# Patient Record
Sex: Male | Born: 1940 | Race: White | Hispanic: No | Marital: Married | Smoking: Former smoker
Health system: Southern US, Community
[De-identification: ages and names within clinical notes are randomized; demographics above are authoritative.]

## PROBLEM LIST (undated history)

## (undated) DIAGNOSIS — K5790 Diverticulosis of intestine, part unspecified, without perforation or abscess without bleeding: Secondary | ICD-10-CM

## (undated) DIAGNOSIS — C189 Malignant neoplasm of colon, unspecified: Secondary | ICD-10-CM

## (undated) DIAGNOSIS — G4730 Sleep apnea, unspecified: Secondary | ICD-10-CM

## (undated) DIAGNOSIS — J8410 Pulmonary fibrosis, unspecified: Secondary | ICD-10-CM

## (undated) DIAGNOSIS — Z9989 Dependence on other enabling machines and devices: Secondary | ICD-10-CM

## (undated) DIAGNOSIS — Z803 Family history of malignant neoplasm of breast: Secondary | ICD-10-CM

## (undated) DIAGNOSIS — I10 Essential (primary) hypertension: Secondary | ICD-10-CM

## (undated) DIAGNOSIS — D689 Coagulation defect, unspecified: Secondary | ICD-10-CM

## (undated) DIAGNOSIS — H269 Unspecified cataract: Secondary | ICD-10-CM

## (undated) DIAGNOSIS — E785 Hyperlipidemia, unspecified: Secondary | ICD-10-CM

## (undated) DIAGNOSIS — Z86010 Personal history of colonic polyps: Secondary | ICD-10-CM

## (undated) DIAGNOSIS — Z800 Family history of malignant neoplasm of digestive organs: Secondary | ICD-10-CM

## (undated) DIAGNOSIS — I4510 Unspecified right bundle-branch block: Secondary | ICD-10-CM

## (undated) DIAGNOSIS — J449 Chronic obstructive pulmonary disease, unspecified: Secondary | ICD-10-CM

## (undated) DIAGNOSIS — T7840XA Allergy, unspecified, initial encounter: Secondary | ICD-10-CM

## (undated) DIAGNOSIS — G4733 Obstructive sleep apnea (adult) (pediatric): Secondary | ICD-10-CM

## (undated) DIAGNOSIS — Z801 Family history of malignant neoplasm of trachea, bronchus and lung: Secondary | ICD-10-CM

## (undated) DIAGNOSIS — Z86711 Personal history of pulmonary embolism: Secondary | ICD-10-CM

## (undated) DIAGNOSIS — K219 Gastro-esophageal reflux disease without esophagitis: Secondary | ICD-10-CM

## (undated) DIAGNOSIS — M1990 Unspecified osteoarthritis, unspecified site: Secondary | ICD-10-CM

## (undated) DIAGNOSIS — I2510 Atherosclerotic heart disease of native coronary artery without angina pectoris: Secondary | ICD-10-CM

## (undated) DIAGNOSIS — I480 Paroxysmal atrial fibrillation: Secondary | ICD-10-CM

## (undated) DIAGNOSIS — N400 Enlarged prostate without lower urinary tract symptoms: Secondary | ICD-10-CM

## (undated) HISTORY — DX: Malignant neoplasm of colon, unspecified: C18.9

## (undated) HISTORY — DX: Personal history of colonic polyps: Z86.010

## (undated) HISTORY — DX: Family history of malignant neoplasm of digestive organs: Z80.0

## (undated) HISTORY — DX: Family history of malignant neoplasm of trachea, bronchus and lung: Z80.1

## (undated) HISTORY — DX: Coagulation defect, unspecified: D68.9

## (undated) HISTORY — DX: Pulmonary fibrosis, unspecified: J84.10

## (undated) HISTORY — DX: Sleep apnea, unspecified: G47.30

## (undated) HISTORY — DX: Unspecified right bundle-branch block: I45.10

## (undated) HISTORY — DX: Family history of malignant neoplasm of breast: Z80.3

## (undated) HISTORY — DX: Benign prostatic hyperplasia without lower urinary tract symptoms: N40.0

## (undated) HISTORY — PX: POLYPECTOMY: SHX149

## (undated) HISTORY — DX: Gastro-esophageal reflux disease without esophagitis: K21.9

## (undated) HISTORY — DX: Personal history of pulmonary embolism: Z86.711

## (undated) HISTORY — PX: HAND LIGAMENT RECONSTRUCTION: SHX1726

## (undated) HISTORY — DX: Obstructive sleep apnea (adult) (pediatric): G47.33

## (undated) HISTORY — DX: Allergy, unspecified, initial encounter: T78.40XA

## (undated) HISTORY — DX: Atherosclerotic heart disease of native coronary artery without angina pectoris: I25.10

## (undated) HISTORY — DX: Paroxysmal atrial fibrillation: I48.0

## (undated) HISTORY — DX: Unspecified cataract: H26.9

## (undated) HISTORY — DX: Essential (primary) hypertension: I10

## (undated) HISTORY — DX: Diverticulosis of intestine, part unspecified, without perforation or abscess without bleeding: K57.90

## (undated) HISTORY — PX: UMBILICAL HERNIA REPAIR: SHX196

## (undated) HISTORY — DX: Unspecified osteoarthritis, unspecified site: M19.90

## (undated) HISTORY — DX: Hyperlipidemia, unspecified: E78.5

## (undated) HISTORY — DX: Chronic obstructive pulmonary disease, unspecified: J44.9

## (undated) HISTORY — PX: COLONOSCOPY: SHX174

## (undated) HISTORY — DX: Dependence on other enabling machines and devices: Z99.89

---

## 1990-12-17 DIAGNOSIS — C189 Malignant neoplasm of colon, unspecified: Secondary | ICD-10-CM

## 1990-12-17 HISTORY — DX: Malignant neoplasm of colon, unspecified: C18.9

## 1990-12-17 HISTORY — PX: COLECTOMY: SHX59

## 1991-12-18 HISTORY — PX: COLON SURGERY: SHX602

## 1998-10-20 ENCOUNTER — Encounter: Payer: Self-pay | Admitting: *Deleted

## 1998-10-25 ENCOUNTER — Observation Stay (HOSPITAL_COMMUNITY): Admission: RE | Admit: 1998-10-25 | Discharge: 1998-10-26 | Payer: Self-pay | Admitting: *Deleted

## 2000-01-22 ENCOUNTER — Encounter (INDEPENDENT_AMBULATORY_CARE_PROVIDER_SITE_OTHER): Payer: Self-pay

## 2000-01-22 ENCOUNTER — Other Ambulatory Visit: Admission: RE | Admit: 2000-01-22 | Discharge: 2000-01-22 | Payer: Self-pay | Admitting: Gastroenterology

## 2000-01-22 DIAGNOSIS — Z860100 Personal history of colon polyps, unspecified: Secondary | ICD-10-CM | POA: Insufficient documentation

## 2000-01-22 HISTORY — DX: Personal history of colon polyps, unspecified: Z86.0100

## 2000-01-22 HISTORY — DX: Personal history of colonic polyps: Z86.010

## 2000-04-09 ENCOUNTER — Emergency Department (HOSPITAL_COMMUNITY): Admission: EM | Admit: 2000-04-09 | Discharge: 2000-04-09 | Payer: Self-pay | Admitting: Internal Medicine

## 2001-05-06 ENCOUNTER — Ambulatory Visit (HOSPITAL_COMMUNITY): Admission: RE | Admit: 2001-05-06 | Discharge: 2001-05-06 | Payer: Self-pay | Admitting: *Deleted

## 2001-05-06 ENCOUNTER — Encounter: Payer: Self-pay | Admitting: *Deleted

## 2002-12-17 HISTORY — PX: LAPAROSCOPIC CHOLECYSTECTOMY: SUR755

## 2003-02-26 ENCOUNTER — Encounter: Payer: Self-pay | Admitting: Surgery

## 2003-02-26 ENCOUNTER — Ambulatory Visit (HOSPITAL_COMMUNITY): Admission: RE | Admit: 2003-02-26 | Discharge: 2003-02-26 | Payer: Self-pay | Admitting: Surgery

## 2003-03-12 ENCOUNTER — Ambulatory Visit (HOSPITAL_COMMUNITY): Admission: RE | Admit: 2003-03-12 | Discharge: 2003-03-12 | Payer: Self-pay | Admitting: Gastroenterology

## 2003-03-12 ENCOUNTER — Encounter: Payer: Self-pay | Admitting: Gastroenterology

## 2003-04-08 ENCOUNTER — Ambulatory Visit (HOSPITAL_COMMUNITY): Admission: RE | Admit: 2003-04-08 | Discharge: 2003-04-08 | Payer: Self-pay | Admitting: *Deleted

## 2003-04-08 ENCOUNTER — Encounter: Payer: Self-pay | Admitting: *Deleted

## 2003-04-27 ENCOUNTER — Ambulatory Visit (HOSPITAL_COMMUNITY): Admission: RE | Admit: 2003-04-27 | Discharge: 2003-04-27 | Payer: Self-pay | Admitting: Cardiology

## 2003-05-05 ENCOUNTER — Encounter (INDEPENDENT_AMBULATORY_CARE_PROVIDER_SITE_OTHER): Payer: Self-pay | Admitting: Specialist

## 2003-05-05 ENCOUNTER — Ambulatory Visit (HOSPITAL_BASED_OUTPATIENT_CLINIC_OR_DEPARTMENT_OTHER): Admission: RE | Admit: 2003-05-05 | Discharge: 2003-05-05 | Payer: Self-pay | Admitting: *Deleted

## 2003-08-08 ENCOUNTER — Observation Stay (HOSPITAL_COMMUNITY): Admission: EM | Admit: 2003-08-08 | Discharge: 2003-08-09 | Payer: Self-pay | Admitting: Emergency Medicine

## 2003-08-08 ENCOUNTER — Encounter: Payer: Self-pay | Admitting: General Surgery

## 2003-08-08 ENCOUNTER — Encounter: Payer: Self-pay | Admitting: Emergency Medicine

## 2003-08-08 ENCOUNTER — Encounter (INDEPENDENT_AMBULATORY_CARE_PROVIDER_SITE_OTHER): Payer: Self-pay

## 2004-12-26 ENCOUNTER — Ambulatory Visit: Payer: Self-pay | Admitting: Gastroenterology

## 2005-01-08 ENCOUNTER — Ambulatory Visit: Payer: Self-pay | Admitting: Gastroenterology

## 2005-12-17 DIAGNOSIS — I219 Acute myocardial infarction, unspecified: Secondary | ICD-10-CM

## 2005-12-17 DIAGNOSIS — Z86711 Personal history of pulmonary embolism: Secondary | ICD-10-CM

## 2005-12-17 HISTORY — DX: Personal history of pulmonary embolism: Z86.711

## 2005-12-17 HISTORY — DX: Acute myocardial infarction, unspecified: I21.9

## 2005-12-17 HISTORY — PX: CORONARY ARTERY BYPASS GRAFT: SHX141

## 2006-01-15 ENCOUNTER — Emergency Department (HOSPITAL_COMMUNITY): Admission: EM | Admit: 2006-01-15 | Discharge: 2006-01-16 | Payer: Self-pay | Admitting: Emergency Medicine

## 2006-01-15 ENCOUNTER — Emergency Department (HOSPITAL_COMMUNITY): Admission: EM | Admit: 2006-01-15 | Discharge: 2006-01-15 | Payer: Self-pay | Admitting: Emergency Medicine

## 2006-01-18 ENCOUNTER — Ambulatory Visit: Payer: Self-pay | Admitting: Cardiovascular Disease

## 2006-02-05 ENCOUNTER — Ambulatory Visit: Payer: Self-pay | Admitting: Cardiovascular Disease

## 2006-02-14 ENCOUNTER — Encounter: Payer: Self-pay | Admitting: Internal Medicine

## 2006-02-14 ENCOUNTER — Ambulatory Visit: Payer: Self-pay

## 2006-02-27 ENCOUNTER — Ambulatory Visit: Payer: Self-pay | Admitting: Cardiology

## 2006-02-28 ENCOUNTER — Inpatient Hospital Stay (HOSPITAL_BASED_OUTPATIENT_CLINIC_OR_DEPARTMENT_OTHER): Admission: RE | Admit: 2006-02-28 | Discharge: 2006-02-28 | Payer: Self-pay | Admitting: Internal Medicine

## 2006-02-28 ENCOUNTER — Ambulatory Visit: Payer: Self-pay | Admitting: Internal Medicine

## 2006-03-11 ENCOUNTER — Ambulatory Visit: Payer: Self-pay | Admitting: Internal Medicine

## 2006-03-12 ENCOUNTER — Ambulatory Visit: Payer: Self-pay | Admitting: Cardiovascular Disease

## 2006-03-15 ENCOUNTER — Inpatient Hospital Stay (HOSPITAL_COMMUNITY): Admission: RE | Admit: 2006-03-15 | Discharge: 2006-03-21 | Payer: Self-pay | Admitting: Surgery

## 2006-03-25 ENCOUNTER — Encounter: Payer: Self-pay | Admitting: Internal Medicine

## 2006-03-25 ENCOUNTER — Observation Stay (HOSPITAL_COMMUNITY): Admission: EM | Admit: 2006-03-25 | Discharge: 2006-03-25 | Payer: Self-pay | Admitting: Emergency Medicine

## 2006-03-25 ENCOUNTER — Ambulatory Visit: Payer: Self-pay | Admitting: Internal Medicine

## 2006-03-30 ENCOUNTER — Encounter: Payer: Self-pay | Admitting: Emergency Medicine

## 2006-03-31 ENCOUNTER — Inpatient Hospital Stay (HOSPITAL_COMMUNITY): Admission: EM | Admit: 2006-03-31 | Discharge: 2006-04-05 | Payer: Self-pay | Admitting: Internal Medicine

## 2006-04-01 ENCOUNTER — Encounter: Payer: Self-pay | Admitting: Vascular Surgery

## 2006-04-08 ENCOUNTER — Ambulatory Visit: Payer: Self-pay | Admitting: *Deleted

## 2006-04-09 ENCOUNTER — Encounter (HOSPITAL_COMMUNITY): Admission: RE | Admit: 2006-04-09 | Discharge: 2006-05-09 | Payer: Self-pay | Admitting: Cardiovascular Disease

## 2006-04-12 ENCOUNTER — Ambulatory Visit: Payer: Self-pay | Admitting: Cardiovascular Disease

## 2006-04-12 ENCOUNTER — Ambulatory Visit: Payer: Self-pay | Admitting: Cardiology

## 2006-04-22 ENCOUNTER — Ambulatory Visit: Payer: Self-pay | Admitting: Cardiology

## 2006-04-25 ENCOUNTER — Ambulatory Visit: Payer: Self-pay | Admitting: Cardiology

## 2006-04-25 ENCOUNTER — Ambulatory Visit: Payer: Self-pay | Admitting: Cardiovascular Disease

## 2006-05-15 ENCOUNTER — Ambulatory Visit: Payer: Self-pay | Admitting: *Deleted

## 2006-05-15 ENCOUNTER — Encounter (HOSPITAL_COMMUNITY): Admission: RE | Admit: 2006-05-15 | Discharge: 2006-06-14 | Payer: Self-pay | Admitting: Cardiovascular Disease

## 2006-06-17 ENCOUNTER — Encounter (HOSPITAL_COMMUNITY): Admission: RE | Admit: 2006-06-17 | Discharge: 2006-07-17 | Payer: Self-pay | Admitting: Cardiovascular Disease

## 2006-06-20 ENCOUNTER — Ambulatory Visit: Payer: Self-pay | Admitting: *Deleted

## 2006-07-01 ENCOUNTER — Encounter: Admission: RE | Admit: 2006-07-01 | Discharge: 2006-07-01 | Payer: Self-pay | Admitting: Family Medicine

## 2006-07-03 ENCOUNTER — Encounter: Admission: RE | Admit: 2006-07-03 | Discharge: 2006-07-03 | Payer: Self-pay | Admitting: Family Medicine

## 2006-07-08 ENCOUNTER — Ambulatory Visit: Payer: Self-pay | Admitting: Cardiovascular Disease

## 2006-07-19 ENCOUNTER — Ambulatory Visit: Payer: Self-pay | Admitting: Cardiology

## 2006-08-20 ENCOUNTER — Ambulatory Visit: Payer: Self-pay | Admitting: Cardiology

## 2006-09-17 ENCOUNTER — Ambulatory Visit: Payer: Self-pay | Admitting: Cardiology

## 2006-09-17 ENCOUNTER — Encounter: Admission: RE | Admit: 2006-09-17 | Discharge: 2006-09-17 | Payer: Self-pay | Admitting: Surgery

## 2006-09-27 ENCOUNTER — Ambulatory Visit: Payer: Self-pay | Admitting: Cardiovascular Disease

## 2006-11-19 ENCOUNTER — Ambulatory Visit: Payer: Self-pay | Admitting: Gastroenterology

## 2006-12-23 ENCOUNTER — Ambulatory Visit: Payer: Self-pay | Admitting: Gastroenterology

## 2006-12-30 ENCOUNTER — Ambulatory Visit: Payer: Self-pay | Admitting: Cardiovascular Disease

## 2007-04-28 ENCOUNTER — Ambulatory Visit: Payer: Self-pay | Admitting: Cardiovascular Disease

## 2007-04-28 ENCOUNTER — Ambulatory Visit: Payer: Self-pay | Admitting: Internal Medicine

## 2007-05-13 ENCOUNTER — Ambulatory Visit: Payer: Self-pay

## 2007-10-24 ENCOUNTER — Ambulatory Visit: Payer: Self-pay | Admitting: Cardiovascular Disease

## 2007-12-18 HISTORY — PX: INGUINAL HERNIA REPAIR: SUR1180

## 2007-12-18 HISTORY — PX: VENTRAL HERNIA REPAIR: SHX424

## 2008-02-11 DIAGNOSIS — C4492 Squamous cell carcinoma of skin, unspecified: Secondary | ICD-10-CM

## 2008-02-11 HISTORY — DX: Squamous cell carcinoma of skin, unspecified: C44.92

## 2008-03-18 ENCOUNTER — Ambulatory Visit: Payer: Self-pay | Admitting: Cardiovascular Disease

## 2008-05-03 ENCOUNTER — Ambulatory Visit: Payer: Self-pay | Admitting: Cardiology

## 2008-05-12 ENCOUNTER — Encounter: Payer: Self-pay | Admitting: Pulmonary Disease

## 2008-06-14 ENCOUNTER — Ambulatory Visit (HOSPITAL_COMMUNITY): Admission: RE | Admit: 2008-06-14 | Discharge: 2008-06-14 | Payer: Self-pay | Admitting: General Surgery

## 2008-06-16 ENCOUNTER — Observation Stay (HOSPITAL_COMMUNITY): Admission: EM | Admit: 2008-06-16 | Discharge: 2008-06-17 | Payer: Self-pay | Admitting: Emergency Medicine

## 2008-08-10 ENCOUNTER — Inpatient Hospital Stay (HOSPITAL_COMMUNITY): Admission: RE | Admit: 2008-08-10 | Discharge: 2008-08-14 | Payer: Self-pay | Admitting: General Surgery

## 2008-10-07 ENCOUNTER — Ambulatory Visit: Payer: Self-pay | Admitting: Cardiovascular Disease

## 2009-03-22 DIAGNOSIS — E7800 Pure hypercholesterolemia, unspecified: Secondary | ICD-10-CM

## 2009-03-22 DIAGNOSIS — I4510 Unspecified right bundle-branch block: Secondary | ICD-10-CM

## 2009-03-22 DIAGNOSIS — Z87898 Personal history of other specified conditions: Secondary | ICD-10-CM | POA: Insufficient documentation

## 2009-03-22 DIAGNOSIS — E785 Hyperlipidemia, unspecified: Secondary | ICD-10-CM

## 2009-03-22 DIAGNOSIS — I2510 Atherosclerotic heart disease of native coronary artery without angina pectoris: Secondary | ICD-10-CM | POA: Insufficient documentation

## 2009-03-22 DIAGNOSIS — K219 Gastro-esophageal reflux disease without esophagitis: Secondary | ICD-10-CM | POA: Insufficient documentation

## 2009-03-22 DIAGNOSIS — N400 Enlarged prostate without lower urinary tract symptoms: Secondary | ICD-10-CM | POA: Insufficient documentation

## 2009-03-22 DIAGNOSIS — E669 Obesity, unspecified: Secondary | ICD-10-CM

## 2009-03-22 DIAGNOSIS — I10 Essential (primary) hypertension: Secondary | ICD-10-CM

## 2009-03-23 ENCOUNTER — Encounter: Payer: Self-pay | Admitting: Cardiovascular Disease

## 2009-03-23 ENCOUNTER — Ambulatory Visit: Payer: Self-pay | Admitting: Cardiovascular Disease

## 2009-05-11 ENCOUNTER — Telehealth: Payer: Self-pay | Admitting: Cardiovascular Disease

## 2009-05-12 ENCOUNTER — Ambulatory Visit: Payer: Self-pay | Admitting: Cardiovascular Disease

## 2009-05-12 DIAGNOSIS — R0609 Other forms of dyspnea: Secondary | ICD-10-CM

## 2009-05-23 ENCOUNTER — Encounter: Payer: Self-pay | Admitting: Cardiovascular Disease

## 2009-05-23 ENCOUNTER — Ambulatory Visit: Payer: Self-pay

## 2009-05-23 ENCOUNTER — Ambulatory Visit: Payer: Self-pay | Admitting: Cardiovascular Disease

## 2009-08-25 ENCOUNTER — Encounter: Admission: RE | Admit: 2009-08-25 | Discharge: 2009-08-25 | Payer: Self-pay | Admitting: Otolaryngology

## 2009-09-21 ENCOUNTER — Encounter (INDEPENDENT_AMBULATORY_CARE_PROVIDER_SITE_OTHER): Payer: Self-pay | Admitting: *Deleted

## 2009-10-13 ENCOUNTER — Encounter: Payer: Self-pay | Admitting: Pulmonary Disease

## 2009-11-08 ENCOUNTER — Telehealth: Payer: Self-pay | Admitting: Cardiovascular Disease

## 2009-11-23 ENCOUNTER — Ambulatory Visit: Payer: Self-pay | Admitting: Cardiovascular Disease

## 2009-11-28 ENCOUNTER — Telehealth: Payer: Self-pay | Admitting: Cardiovascular Disease

## 2009-11-29 ENCOUNTER — Encounter: Payer: Self-pay | Admitting: Cardiovascular Disease

## 2009-11-30 LAB — CONVERTED CEMR LAB
BUN: 9 mg/dL (ref 6–23)
Basophils Absolute: 0 10*3/uL (ref 0.0–0.1)
Chloride: 105 meq/L (ref 96–112)
Eosinophils Absolute: 0.1 10*3/uL (ref 0.0–0.7)
Glucose, Bld: 95 mg/dL (ref 70–99)
HCT: 45.3 % (ref 39.0–52.0)
Hemoglobin: 14.9 g/dL (ref 13.0–17.0)
Lymphs Abs: 2.2 10*3/uL (ref 0.7–4.0)
MCHC: 32.8 g/dL (ref 30.0–36.0)
Neutro Abs: 2.7 10*3/uL (ref 1.4–7.7)
Potassium: 3.9 meq/L (ref 3.5–5.1)
RDW: 14.6 % (ref 11.5–14.6)

## 2009-12-19 ENCOUNTER — Encounter (HOSPITAL_COMMUNITY): Admission: RE | Admit: 2009-12-19 | Discharge: 2010-01-18 | Payer: Self-pay | Admitting: Cardiovascular Disease

## 2009-12-27 ENCOUNTER — Inpatient Hospital Stay (HOSPITAL_COMMUNITY): Admission: EM | Admit: 2009-12-27 | Discharge: 2009-12-30 | Payer: Self-pay | Admitting: Emergency Medicine

## 2009-12-27 ENCOUNTER — Ambulatory Visit: Payer: Self-pay | Admitting: Cardiology

## 2009-12-27 ENCOUNTER — Encounter (INDEPENDENT_AMBULATORY_CARE_PROVIDER_SITE_OTHER): Payer: Self-pay | Admitting: Internal Medicine

## 2010-01-14 ENCOUNTER — Emergency Department (HOSPITAL_COMMUNITY)
Admission: EM | Admit: 2010-01-14 | Discharge: 2010-01-14 | Payer: Self-pay | Source: Home / Self Care | Admitting: Emergency Medicine

## 2010-01-14 ENCOUNTER — Telehealth: Payer: Self-pay | Admitting: Nurse Practitioner

## 2010-01-16 DIAGNOSIS — G4733 Obstructive sleep apnea (adult) (pediatric): Secondary | ICD-10-CM

## 2010-01-23 ENCOUNTER — Ambulatory Visit: Payer: Self-pay | Admitting: Cardiovascular Disease

## 2010-01-23 DIAGNOSIS — I4891 Unspecified atrial fibrillation: Secondary | ICD-10-CM | POA: Insufficient documentation

## 2010-01-26 ENCOUNTER — Ambulatory Visit: Payer: Self-pay | Admitting: Pulmonary Disease

## 2010-02-07 ENCOUNTER — Ambulatory Visit: Payer: Self-pay | Admitting: Pulmonary Disease

## 2010-02-07 DIAGNOSIS — J310 Chronic rhinitis: Secondary | ICD-10-CM

## 2010-02-08 ENCOUNTER — Encounter: Payer: Self-pay | Admitting: Pulmonary Disease

## 2010-02-12 ENCOUNTER — Encounter: Payer: Self-pay | Admitting: Pulmonary Disease

## 2010-03-09 ENCOUNTER — Encounter: Payer: Self-pay | Admitting: Pulmonary Disease

## 2010-03-29 ENCOUNTER — Ambulatory Visit: Payer: Self-pay | Admitting: Pulmonary Disease

## 2010-04-03 ENCOUNTER — Telehealth: Payer: Self-pay | Admitting: Cardiovascular Disease

## 2010-04-12 ENCOUNTER — Ambulatory Visit: Payer: Self-pay | Admitting: Cardiovascular Disease

## 2010-04-19 ENCOUNTER — Encounter: Payer: Self-pay | Admitting: Pulmonary Disease

## 2010-04-21 ENCOUNTER — Telehealth: Payer: Self-pay | Admitting: Gastroenterology

## 2010-05-05 ENCOUNTER — Encounter: Payer: Self-pay | Admitting: Pulmonary Disease

## 2010-05-05 ENCOUNTER — Telehealth (INDEPENDENT_AMBULATORY_CARE_PROVIDER_SITE_OTHER): Payer: Self-pay | Admitting: *Deleted

## 2010-06-05 ENCOUNTER — Ambulatory Visit: Payer: Self-pay | Admitting: Cardiovascular Disease

## 2010-06-18 ENCOUNTER — Emergency Department (HOSPITAL_COMMUNITY): Admission: EM | Admit: 2010-06-18 | Discharge: 2010-06-18 | Payer: Self-pay | Admitting: Emergency Medicine

## 2010-11-23 ENCOUNTER — Encounter (INDEPENDENT_AMBULATORY_CARE_PROVIDER_SITE_OTHER): Payer: Self-pay | Admitting: *Deleted

## 2011-01-07 ENCOUNTER — Encounter: Payer: Self-pay | Admitting: Surgery

## 2011-01-10 ENCOUNTER — Encounter: Payer: Self-pay | Admitting: Cardiovascular Disease

## 2011-01-10 ENCOUNTER — Emergency Department (HOSPITAL_COMMUNITY)
Admission: EM | Admit: 2011-01-10 | Discharge: 2011-01-11 | Payer: Self-pay | Source: Home / Self Care | Admitting: Emergency Medicine

## 2011-01-11 LAB — CBC
Hemoglobin: 14.5 g/dL (ref 13.0–17.0)
MCV: 81.2 fL (ref 78.0–100.0)
Platelets: 171 10*3/uL (ref 150–400)
RBC: 5.26 MIL/uL (ref 4.22–5.81)
WBC: 6.2 10*3/uL (ref 4.0–10.5)

## 2011-01-11 LAB — DIFFERENTIAL
Eosinophils Absolute: 0.1 10*3/uL (ref 0.0–0.7)
Lymphocytes Relative: 36 % (ref 12–46)
Lymphs Abs: 2.3 10*3/uL (ref 0.7–4.0)
Neutro Abs: 3.2 10*3/uL (ref 1.7–7.7)
Neutrophils Relative %: 51 % (ref 43–77)

## 2011-01-11 LAB — BASIC METABOLIC PANEL
CO2: 25 mEq/L (ref 19–32)
Calcium: 9.4 mg/dL (ref 8.4–10.5)
Creatinine, Ser: 1.08 mg/dL (ref 0.4–1.5)
GFR calc Af Amer: >60 mL/min (ref >60)
Glucose, Bld: 149 mg/dL — ABNORMAL HIGH (ref 70–99)

## 2011-01-11 LAB — POCT CARDIAC MARKERS
CKMB, poc: <1 ng/mL — ABNORMAL LOW (ref 1.0–8.0)
Troponin i, poc: <0.05 ng/mL (ref 0.00–0.09)

## 2011-01-18 NOTE — Progress Notes (Signed)
Summary: Pt has no enegry thinks it's coming from medication  Phone Note Call from Patient Call back at Home Phone [phone]   Caller: Patient Summary of Call: Pt has no enegry Initial call taken by: Judie Grieve,  April 03, 2010 10:35 AM  Follow-up for Phone Call        spoke with pt, he wants to discuss his meds with dr Eden Emms. he states he feels fatiqued and tired all the time and wants to discuss poss coming off some of his meds. follow up appt made Deliah Goody, RN  April 03, 2010 10:46 AM

## 2011-01-18 NOTE — Assessment & Plan Note (Signed)
Summary: rov/ mbw   Copy to:  Dr. Charlton Haws Primary Provider/Referring Provider:  Dr. Neva Seat  CC:  Dyspnea follow-up.  Discuss PFT's..  History of Present Illness: 70 yo male with dyspnea, and sleep apnea.  He continues to have trouble with his breathing when he exerts himself.  He has not noticed any benefit from inhaler therapy.  His PFT's today were essentially normal  He has been using his CPAP.  He has not had his pressure changed since starting CPAP therapy in 2009.  He had his sleep study done with [facility].  He is using [employer] for his DME.  His main problem with CPAP is nasal congestion.  He has been using omnaris for the past two months, and this has helped.   Current Medications (verified): 1)  Zocor 20 Mg Tabs (Simvastatin) .Marland Kitchen.. 1 Tab By Mouth Once Daily 2)  Aspirin 325 Mg  Tabs (Aspirin) .Marland Kitchen.. 1 Tab By Mouth Once Daily 3)  Carvedilol 6.25 Mg Tabs (Carvedilol) .Marland Kitchen.. 1 Tab By Mouth Two Times A Day 4)  Flomax 0.4 Mg Xr24h-Cap (Tamsulosin Hcl) .Marland Kitchen.. 1 Tab By Mouth Once Daily 5)  Lisinopril 10 Mg Tabs (Lisinopril) .... Take One Tablet By Mouth Daily 6)  Prilosec 20 Mg Cpdr (Omeprazole) .Marland Kitchen.. 1 Tab By Mouth Once Daily 7)  Fish Oil 1000 Mg Caps (Omega-3 Fatty Acids) .Marland Kitchen.. 1 Cap Once Daily 8)  Garlic .Marland Kitchen.. 1 Tab By Mouth Once Daily 9)  Multaq 400 Mg Tabs (Dronedarone Hcl) .Marland Kitchen.. 1 Tab By Mouth Two Times A Day 10)  Xopenex Hfa 45 Mcg/act Aero (Levalbuterol Tartrate) .... Two Puffs Up To Four Times Per Day As Needed  Allergies (verified): No Known Drug Allergies  Past History:  Past Surgical History: Last updated: 01/26/2010 Sigmoid colectomy with colostomy 1992 Left inguinal hernia repair  Laprascopic cholecystectomy 2004 Three vessel coronary artery bypass graft 2007 Ventral hernia repair 2009  Past Medical History: CAD HTN Grade 1 diastolic dysfunction      - Echo Jan. 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   Nose:  no deformity, discharge, inflammation, or lesions Mouth:  MP 3, no oral lesions Neck:  no JVD.   Lungs:  diminished breath sounds, no wheezing or rales Heart:  regular rhythm, normal rate, and no murmurs.   Extremities:  no clubbing, cyanosis, edema, or deformity noted Cervical Nodes:  no significant adenopathy   Impression & Recommendations:  Problem # 1:  DYSPNEA (ICD-786.05) His pulmonary evaluation has been unremarkable for a specific cause of  his dyspnea.  My suspicion is that he likely has deconditioning related to inactivity and obesity.  I have advised him that, if there are no cardiac contra-indications, then he should start a gradual exercise program consisting of both cardiovascular training and light weight training.  Also advised that we could perform cardio-pulmonary exercise testing to further evaluate.  He would like to see what progress he makes with increasing exercise tolerance.  If he remains symptomatic, then he would consider CPET.  Problem # 2:  OBSTRUCTIVE SLEEP APNEA (ICD-327.23) He has severe sleep apnea.  He is on CPAP therapy.  I explained how sleep apnea can affect his health.  Driving precautions were discussed.  I explained how his weight is affecting his sleep.  Will get a copy of his CPAP download, and decide if any changes  are needed to his set up.  Problem # 3:  RHINITIS (ICD-472.0) He is to continue with omnaris.  Advised him to use nasal irrigation on a regular basis.  Medications Added to Medication List This Visit: 1)  Omnaris 50 Mcg/act Susp (Ciclesonide) .... Two sprays once daily  Complete Medication List: 1)  Zocor 20 Mg Tabs (Simvastatin) .Marland Kitchen.. 1 tab by mouth once daily 2)  Aspirin 325 Mg Tabs (Aspirin) .Marland Kitchen.. 1 tab by mouth once daily 3)  Carvedilol 6.25 Mg Tabs (Carvedilol) .Marland Kitchen.. 1 tab by mouth two times a day 4)  Flomax 0.4 Mg Xr24h-cap (Tamsulosin hcl) .Marland Kitchen.. 1 tab by mouth once daily 5)  Lisinopril 10 Mg Tabs (Lisinopril) .... Take one tablet by mouth daily 6)  Prilosec 20 Mg Cpdr (Omeprazole) .Marland Kitchen.. 1 tab by mouth once daily 7)  Fish Oil 1000 Mg Caps (Omega-3 fatty acids) .Marland Kitchen.. 1 cap once daily 8)  Garlic  .Marland Kitchen.. 1 tab by mouth once daily 9)  Multaq 400 Mg Tabs (Dronedarone hcl) .Marland Kitchen.. 1 tab by mouth two times a day 10)  Xopenex Hfa 45 Mcg/act Aero (Levalbuterol tartrate) .... Two puffs up to four times per day as needed 11)  Omnaris 50 Mcg/act Susp (Ciclesonide) .... Two sprays once daily  Other Orders: Est. Patient Level III (16109) DME Referral (DME)  Patient Instructions: 1)  Will get CPAP report from your machine 2)  Follow up in 3 months   Immunization History:  Influenza Immunization History:    Influenza:  historical (10/17/2009)  Pneumovax Immunization History:    Pneumovax:  historical (10/17/2005)

## 2011-01-18 NOTE — Assessment & Plan Note (Signed)
Summary: rov ///kp   Copy to:  Dr. Charlton Haws Primary Provider/Referring Provider:  Dr. Neva Seat  CC:  CPAP follow-up.  The patient says he is still trying to adjust to wearing the CPAP. He averages 5-6 hours every night. The patient does c/o fatigue. He is not using the Xopenex and says it did not help when he tried it.[name]  History of Present Illness: 70 yo male with dyspnea likely from deconditioning, and sleep apnea using CPAP 12 cm.  He has been doing better with CPAP.  He uses a nasal mask.  He gets a dry mouth occasionally, but otherwise has no trouble with his mask.  He is sleeping better, and feels like he has more energy during the day.  He is using his CPAP for about 6 hours per night.  His wife does not hear anymore snoring when he uses his CPAP.   Current Medications (verified): 1)  Zocor 20 Mg Tabs (Simvastatin) .[name].. 1 Tab By Mouth Once Daily 2)  Aspirin 325 Mg  Tabs (Aspirin) .[name].. 1 Tab By Mouth Once Daily 3)  Carvedilol 6.25 Mg Tabs (Carvedilol) .[name].. 1 Tab By Mouth Two Times A Day 4)  Flomax 0.4 Mg Xr24h-Cap (Tamsulosin Hcl) .[name].. 1 Tab By Mouth Once Daily 5)  Lisinopril 10 Mg Tabs (Lisinopril) .... Take One Tablet By Mouth Daily 6)  Prilosec 20 Mg Cpdr (Omeprazole) .[name].. 1 Tab By Mouth Once Daily 7)  Fish Oil 1000 Mg Caps (Omega-3 Fatty Acids) .[name].. 1 Cap Once Daily 8)  Garlic .[name].. 1 Tab By Mouth Once Daily 9)  Multaq 400 Mg Tabs (Dronedarone Hcl) .... 1/2 By Mouth Two Times A Day 10)  Xopenex Hfa 45 Mcg/act Aero (Levalbuterol Tartrate) .... Two Puffs Up To Four Times Per Day As Needed 11)  Omnaris 50 Mcg/act Susp (Ciclesonide) .... Two Sprays Once Daily  Allergies (verified): No Known Drug Allergies  Past History:  Past Medical History: CAD HTN Grade 1 diastolic dysfunction      - Echo Jan. 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 Sigmoid colectomy with colostomy 1992 Left inguinal hernia repair  Laprascopic cholecystectomy 2004 Three vessel coronary artery bypass graft 2007 Ventral hernia repair 2009  Vital Signs:  Patient profile:   70 year old male Height:      71 inches (180.34 cm) Weight:      241 pounds (109.55 kg) BMI:     33.73 O2 Sat:      94 % on Room air Temp:     97.8 degrees F (36.56 degrees C) oral Pulse rate:   83 / minute BP sitting:   132 / 80  (left arm) Cuff size:   regular  Vitals Entered By: Michel Bickers CMA (March 29, 2010 3:39 PM)  O2 Flow:  Room air CC: CPAP follow-up.  The patient says he is still trying to adjust to wearing the CPAP. He averages 5-6 hours every night. The patient does c/o fatigue. He is not using the Xopenex and says it did not help when he tried it.   Physical Exam  General:  obese.   Nose:  no deformity, discharge, inflammation, or lesions Mouth:  MP 3, no oral lesions Neck:  no JVD.   Lungs:  diminished breath sounds, no wheezing or rales Heart:  regular rhythm, normal rate, and no murmurs.   Extremities:  no clubbing, cyanosis, edema, or deformity noted Cervical Nodes:  no significant adenopathy   Impression & Recommendations:  Problem # 1:  OBSTRUCTIVE SLEEP APNEA (ICD-327.23) He has done well with CPAP, but feels that the pressure from the machine is now too high.  WIll arrange for auto-CPAP titration to see if we can decrease his pressure.  Problem # 2:  DYSPNEA (ICD-786.05) Likely form deconditioning.  Have encouraged him to continue his exercise program to gradual improve his stamina.  He is to also f/u with cardiology and primary care.  If his symptoms are persistent, and without explanation, he may need to have cardio-pulmonary exercise  testing.  Medications Added to Medication List This Visit: 1)  Multaq 400 Mg Tabs (Dronedarone hcl) .... 1/2 by mouth two times a day  Complete Medication List: 1)  Zocor 20 Mg Tabs (Simvastatin) .[name].. 1 tab by mouth once daily 2)  Aspirin 325 Mg Tabs (Aspirin) .[name].. 1 tab by mouth once daily 3)  Carvedilol 6.25 Mg Tabs (Carvedilol) .[name].. 1 tab by mouth two times a day 4)  Flomax 0.4 Mg Xr24h-cap (Tamsulosin hcl) .[name].. 1 tab by mouth once daily 5)  Lisinopril 10 Mg Tabs (Lisinopril) .... Take one tablet by mouth daily 6)  Prilosec 20 Mg Cpdr (Omeprazole) .[name].. 1 tab by mouth once daily 7)  Fish Oil 1000 Mg Caps (Omega-3 fatty acids) .[name].. 1 cap once daily 8)  Garlic  .[name].. 1 tab by mouth once daily 9)  Multaq 400 Mg Tabs (Dronedarone hcl) .... 1/2 by mouth two times a day 10)  Xopenex Hfa 45 Mcg/act Aero (Levalbuterol tartrate) .... Two puffs up to four times per day as needed 11)  Omnaris 50 Mcg/act Susp (Ciclesonide) .... Two sprays once daily  Other Orders: Est. Patient Level III (14782) DME Referral (DME)  Patient Instructions: 1)  Will arrange for a test of CPAP pressure setting at home 2)  Follow up in 4 months

## 2011-01-18 NOTE — Letter (Signed)
Summary: Appointment - Reminder 2  [facility]. [address] [place] [address]   Phone: [phone]  Fax: [phone]     November 23, 2010 [name]   [name] [address] [place], [place]  [address]   Dear [name],  Our records indicate that it is time to schedule a follow-up appointment.  Dr. Eden Emms         recommended that you follow up with [place] in    11/2010        . It is very important that we reach you to schedule this appointment. We look forward to participating in your health care needs. Please contact us at the number listed above at your earliest convenience to schedule your appointment.  If you are unable to make an appointment at this time, give [place] a call so we can update our records.     Sincerely,   Glass blower/designer

## 2011-01-18 NOTE — Miscellaneous (Signed)
Summary: CPAP 12 cm download 12/19/09 to 02/12/10  Clinical Lists Changes Used on 37 of 56 days with average 2hrs .  Average AHI 4.5 with pressure CPAP 12 cm H2O.  Will have my nurse call to inform pt that CPAP report looks good, but he needs to try to use CPAP for entire time he is asleep to get full benefit.  Appended Document: CPAP 12 cm download 12/19/09 to 02/12/10 LMOMTCB.  Appended Document: CPAP 12 cm download 12/19/09 to 02/12/10 Patient had verbal understanding that CPAP report looks good and that he needs to wear CPAP for the entire time he is sleeping to get full benefit. The patient is sch for f/u on 03/29/2010.

## 2011-01-18 NOTE — Miscellaneous (Signed)
Summary: CPAP download from 08/30/09 to 10/13/09  Clinical Lists Changes Pressure at 12 cm H2O.  Average AHI 2.5.  Used on 33 of 45 nights with average of 3hrs 17 min.  Will have my nurse contact pt to inform that CPAP report from Oct. 2010 looked okay, and will try to get more recent download to assess current status. Orders: Added new Referral order of DME Referral (DME) - Signed  Appended Document: CPAP download from 08/30/09 to 10/13/09 Patient aware download from CPAP in Oct 2010 looked good and Dr. Craige Cotta is going to order another one for more recent data. He will be contacted by his DME company regarding this matter.

## 2011-01-18 NOTE — Progress Notes (Signed)
Summary: Abd Pain & Constipation  Phone Note Call from Patient Call back at Home Phone [phone]   Caller: Fannie Knee Call For: Dr Jarold Motto Reason for Call: Talk to Nurse Summary of Call: Is in alot of pain - has not had a BM since Wednesday. Went to see Primary Care and was told to take a laxative but it was no help. Wonders if he can be seen fairly quickly.  Initial call taken by: Leanor Kail [facility],  Apr 21, 2010 9:08 AM  Follow-up for Phone Call        Pt said over the last 2 weeks he hasn't moved his bowels daily like he normall does. Dr Tiburcio Pea had him use a laxative and on Tues PM he had a substantial amount of stool.  He said he hasn't done much since then.  I asked about pain, blood in stool or on toilet tissue.  He has no pain and has seen no blood.  I recommended he use Miralax daily to help him get in a rhythm of having a BM.  I explained how to take it and asked him to call me Monday 5-9 and let me know what his bowel habits were over the weekend.  He thanked me and said he would get Miralax and call me Monday. Follow-up by: Joselyn Glassman,  Apr 21, 2010 9:37 AM  Additional Follow-up for Phone Call Additional follow up Details #1::        i agree Additional Follow-up by: Mardella Layman MD [facility],  Apr 21, 2010 12:37 PM

## 2011-01-18 NOTE — Miscellaneous (Signed)
Summary: Pulmonary function test   Pulmonary Function Test Date: 02/07/2010 Height (in.): 71 Gender: Male  Pre-Spirometry FVC    Value: 3.98 L/min   Pred: 4.54 L/min     % Pred: 88 % FEV1    Value: 3.05 L     Pred: 3.06 L     % Pred: 99 % FEV1/FVC  Value: 77 %     Pred: 68 %     % Pred: . % FEF 25-75  Value: 2.72 L/min   Pred: 2.75 L/min     % Pred: 99 %  Post-Spirometry FVC    Value: 3.98 L/min   Pred: 4.54 L/min     % Pred: 88 % FEV1    Value: 3.14 L     Pred: 3.06 L     % Pred: 103 % FEV1/FVC  Value: 79 %     Pred: 68 %     % Pred: . % FEF 25-75  Value: 3.00 L/min   Pred: 2.75 L/min     % Pred: 109 %  Lung Volumes TLC    Value: 7.94 L   % Pred: 117 % RV    Value: 3.80 L   % Pred: 146 % DLCO    Value: 21.6 %   % Pred: 84 % DLCO/VA  Value: 3.75 %   % Pred: 106 %  Comments: Normal spirometry.  Normal lung volumes.  Normal diffusion capacity.  No bronchodilator response. Clinical Lists Changes  Observations: Added new observation of PFT COMMENTS: Normal spirometry.  Normal lung volumes.  Normal diffusion capacity.  No bronchodilator response. (02/08/2010 8:33) Added new observation of DLCO/VA%EXP: 106 % (02/08/2010 8:33) Added new observation of DLCO/VA: 3.75 % (02/08/2010 8:33) Added new observation of DLCO % EXPEC: 84 % (02/08/2010 8:33) Added new observation of DLCO: 21.6 % (02/08/2010 8:33) Added new observation of RV % EXPECT: 146 % (02/08/2010 8:33) Added new observation of RV: 3.80 L (02/08/2010 8:33) Added new observation of TLC % EXPECT: 117 % (02/08/2010 8:33) Added new observation of TLC: 7.94 L (02/08/2010 8:33) Added new observation of FEF2575%EXPS: 109 % (02/08/2010 8:33) Added new observation of PSTFEF25/75P: 2.75  (02/08/2010 8:33) Added new observation of PSTFEF25/75%: 3.00 L/min (02/08/2010 8:33) Added new observation of PSTFEV1/FCV%: . % (02/08/2010 8:33) Added new observation of FEV1FVCPRDPS: 68 % (02/08/2010 8:33) Added new observation of PSTFEV1/FVC:  79 % (02/08/2010 8:33) Added new observation of POSTFEV1%PRD: 103 % (02/08/2010 8:33) Added new observation of FEV1PRDPST: 3.06 L (02/08/2010 8:33) Added new observation of POST FEV1: 3.14 L/min (02/08/2010 8:33) Added new observation of POST FVC%EXP: 88 % (02/08/2010 8:33) Added new observation of FVCPRDPST: 4.54 L/min (02/08/2010 8:33) Added new observation of POST FVC: 3.98 L (02/08/2010 8:33) Added new observation of FEF % EXPEC: 99 % (02/08/2010 8:33) Added new observation of FEF25-75%PRE: 2.75 L/min (02/08/2010 8:33) Added new observation of FEF 25-75%: 2.72 L/min (02/08/2010 8:33) Added new observation of FEV1/FVC%EXP: . % (02/08/2010 8:33) Added new observation of FEV1/FVC PRE: 68 % (02/08/2010 8:33) Added new observation of FEV1/FVC: 77 % (02/08/2010 8:33) Added new observation of FEV1 % EXP: 99 % (02/08/2010 8:33) Added new observation of FEV1 PREDICT: 3.06 L (02/08/2010 8:33) Added new observation of FEV1: 3.05 L (02/08/2010 8:33) Added new observation of FVC % EXPECT: 88 % (02/08/2010 8:33) Added new observation of FVC PREDICT: 4.54 L (02/08/2010 8:33) Added new observation of FVC: 3.98 L (02/08/2010 8:33) Added new observation of PFT HEIGHT: 71  (02/08/2010 8:33) Added new observation of  PFT DATE: 02/07/2010  (02/08/2010 8:33)

## 2011-01-18 NOTE — Assessment & Plan Note (Signed)
Summary: eph/per Samara Deist will need EKG/jss   History of Present Illness: Anes is seen today for F/U of CAD with CABG in 2007.  He has chronic dyspnea that seems related to sinus and nasal issues.  He and his wife seem fixated on this.  He has normal LV function and no evidence of pulmonary hypertension.  He is a previous smoker.  He has had CT's for the last 3 years for a RLL nodule that has been stable over 3 years.  He is overweight with sleep apnea and wears CPAP at night.  He denies fever, cough, sputum. He gets congested at night.  He gets occasional SSCP with his SOB but it is infrequent and related to his nasal congestion.  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 1 Tab By Mouth Once Daily 2)  Aspirin 325 Mg  Tabs (Aspirin) .Marland Kitchen.. 1 Tab By Mouth Once Daily 3)  Carvedilol 6.25 Mg Tabs (Carvedilol) .Marland Kitchen.. 1 Tab By Mouth Two Times A Day 4)  Flomax 0.4 Mg Xr24h-Cap (Tamsulosin Hcl) .Marland Kitchen.. 1 Tab By Mouth Once Daily 5)  Lisinopril 10 Mg Tabs (Lisinopril) .... Take One Tablet By Mouth Daily 6)  Prilosec 20 Mg Cpdr (Omeprazole) .Marland Kitchen.. 1 Tab By Mouth Once Daily 7)  Fish Oil 1000 Mg Caps (Omega-3 Fatty Acids) .Marland Kitchen.. 1 Cap Once Daily 8)  Garlic .... 2 Tab By  Mouth Once Daily 9)  Multaq 400 Mg Tabs (Dronedarone Hcl) .Marland Kitchen.. 1 Tab By Mouth Two Times A Day  Allergies (verified): No Known Drug Allergies  Past History:  Past Medical History: Last updated: 03/23/2009 CABG 2007 Prostatism Hyperlipidemia Hypertension Lung nodule  Past Surgical History: Last updated: 03/22/2009 Coronary artery bypass graft   ventral hernia surgery bypass surgery 3 vessel  cholecystectomy  colon  resection  Family History: Last updated: 03/23/2009 non-contributory  Social History: Last updated: 03/23/2009 Married Retired Lives in [place] Non-smoker Non-drinker  Review of Systems       Denies fever, malais, weight loss, blurry vision, decreased visual acuity, cough, sputum,  hemoptysis, pleuritic pain, palpitaitons, heartburn, abdominal pain, melena, lower extremity edema, claudication, or rash. All other systems reviewed and negative  Vital Signs:  Patient profile:   70 year old male Height:      71 inches Weight:      237 pounds BMI:     33.17 Pulse rate:   68 / minute Resp:     14 per minute BP sitting:   115 / 76  (right arm)  Vitals Entered By: Kem Parkinson (January 23, 2010 3:28 PM)  Physical Exam  General:  Affect appropriate Healthy:  appears stated age HEENT: normal Neck supple with no  adenopathy JVP normal no bruits no thyromegaly Lungs clear with no wheezing and good diaphragmatic motion Heart:  S1/S2 no murmur,rub, gallop or click PMI normal Abdomen: benighn, BS positve, no tenderness, no AAA no bruit.  No HSM or HJR Distal pulses intact with no bruits No edema Neuro non-focal Skin warm and dry Small ventral hernia   Impression & Recommendations:  Problem # 1:  SLEEP APNEA (ICD-780.57) F/U pulmonary consider pulmonary rehab Orders: Pulmonary Referral (Pulmonary)  Problem # 2:  PULMONARY NODULE (ICD-518.89) Stable over 3 years no need for CT  Quit smoking  Problem # 3:  BUNDLE BRANCH BLOCK, RIGHT  (ICD-426.4) Stabel with no evidence of high grade heart block His updated medication list for this problem includes:    Aspirin 325 Mg Tabs (Aspirin) .Marland Kitchen... 1 tab by mouth once daily    Carvedilol 6.25 Mg Tabs (Carvedilol) .Marland Kitchen... 1 tab by mouth two times a day    Lisinopril 10 Mg Tabs (Lisinopril) .Marland Kitchen... Take one tablet by mouth daily    Multaq 400 Mg Tabs (Dronedarone hcl) .Marland Kitchen... 1 tab by mouth two times a day  Problem # 4:  HYPERCHOLESTEROLEMIA (ICD-272.0) Watch liver check again in 2 months His updated medication list for this problem includes:    Zocor 20 Mg Tabs (Simvastatin) .Marland Kitchen... 1 tab by mouth once daily  Problem # 5:  ATRIAL FIBRILLATION (ICD-427.31) PAF  check LFTs in 8 weeks Consider stopping Multaq in 6 months His updated medication list for this problem includes:    Aspirin 325 Mg Tabs (Aspirin) .Marland Kitchen... 1 tab by mouth once daily    Carvedilol 6.25 Mg Tabs (Carvedilol) .Marland Kitchen... 1 tab by mouth two times a day    Multaq 400 Mg Tabs (Dronedarone hcl) .Marland Kitchen... 1 tab by mouth two times a day  Patient Instructions: 1)  Your physician recommends that you schedule a follow-up appointment in: 6 MONTHS 2)  You have been referred to PULMONARY FOR SLEEP APNEA AND POSS NEED FOR PULMONARY REHAB 3)  Your physician recommends that you return for lab work in:2 MONTHS- LIVER/401.1/V58.69   EKG Report  Procedure date:  01/23/2010  Findings:      NSR 69 QT 444 PR 212 RBBB

## 2011-01-18 NOTE — Miscellaneous (Signed)
Summary: [name] Progress Note  [name] Progress Note   Imported By: Roderic Ovens 05/04/2010 11:07:27  _____________________________________________________________________  External Attachment:    Type:   Image     Comment:   External Document

## 2011-01-18 NOTE — Miscellaneous (Signed)
Summary: Orders Update pft charges  Clinical Lists Changes  Orders: Added new Service order of Carbon Monoxide diffusing w/capacity (94720) - Signed Added new Service order of Lung Volumes (94240) - Signed Added new Service order of Spirometry (Pre & Post) (94060) - Signed 

## 2011-01-18 NOTE — Progress Notes (Signed)
Summary: returned call [facility] x1   Phone Note Call from Patient Call back at Home Phone [phone]   Call For: sood Summary of Call: Returning Lori's cal. Initial call taken by: Darletta Moll,  May 05, 2010 4:30 PM  Follow-up for Phone Call        Left message for pt to call office.  Gweneth Dimitri RN  May 05, 2010 4:33 PM      Appended Document: returned call [facility] x1  pt advsied per append.

## 2011-01-18 NOTE — Assessment & Plan Note (Signed)
Summary: rov   Visit Type:  Follow-up Referring Provider:  Dr. Charlton Haws Primary Provider:  Dr. Neva Seat  CC:  NO CARDIOLOGY COMPLAINTS.  History of Present Illness: [name] is seen today for F/U of CAD with CABG in 2007.  He has chronic dyspnea that seems related to sinus and nasal issues.  He and his wife seem fixated on this.  He has normal LV function and no evidence of pulmonary hypertension.  He is a previous smoker.  He has had CT's for the last 3 years for a RLL nodule that has been stable over 3 years.  He is overweight with sleep apnea and wears CPAP at night.  His pressure has been decreased recently to 10 mmHG  He denies fever, cough, sputum. He gets congested at night.  He appears to have lost some weight by scales but still has a way to go.   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 1 Tab By Mouth Once Daily 2)  Carvedilol 6.25 Mg Tabs (Carvedilol) .Marland Kitchen.. 1 Tab By Mouth Two Times A Day 3)  Flomax 0.4 Mg Xr24h-Cap (Tamsulosin Hcl) .Marland Kitchen.. 1 Tab By Mouth Once Daily 4)  Lisinopril 10 Mg Tabs (Lisinopril) .... Take One Tablet By Mouth Daily 5)  Prilosec 20 Mg Cpdr (Omeprazole) .Marland Kitchen.. 1 Tab By Mouth Once Daily 6)  Fish Oil 1000 Mg Caps (Omega-3 Fatty Acids) .Marland Kitchen.. 1 Cap Once Daily 7)  Garlic .Marland Kitchen.. 1 Tab By Mouth Once Daily 8)  Omnaris 50 Mcg/act Susp (Ciclesonide) ....  As Needed 9)  Zyrtec Allergy 10 Mg Tabs (Cetirizine Hcl) .... Take As Needed  Allergies (verified): No Known Drug Allergies  Past History:  Past Medical History: Last updated: 03/29/2010 CAD HTN Grade 1 diastolic dysfunction      - Echo Jan. 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 OSA      - PSG 05/12/08 AHI 53 with Complex apnea      - CPAP 12 cm H2O Dyspnea      - PFT 02/07/10 FVC 3.98(88), FEV1 3.14(103%), FEV1% 79, TLC 7.94(117%), DLCO 84%, no BD   Past Surgical History: Last updated: 01/26/2010 Sigmoid colectomy with colostomy 1992 Left inguinal hernia repair  Laprascopic cholecystectomy 2004 Three vessel coronary artery bypass graft 2007 Ventral hernia repair 2009  Family History: Last updated: 01/26/2010 Family History Colon Cancer---sister Family History Lung Cancer---mother Family History Breast Cancer---2 sisters Family History Emphysema ---father Heart disease---father and mother  Social History: Last updated: 01/26/2010 Married Retired Lives in [place] Non-smoker; Quit in 1997, 40 pack year history 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   Vital Signs:  Patient profile:   70 year old male Weight:      236 pounds Pulse rate:   98 / minute BP sitting:   143 / 87  (right arm)  Vitals Entered By: Dreama Saa, CNA (June 05, 2010 11:29 AM)  Physical Exam  General:  Affect appropriate Healthy:  appears stated age HEENT: normal Neck supple with no adenopathy JVP normal no bruits no thyromegaly Lungs clear with no wheezing and good diaphragmatic motion Heart:  S1/S2 no murmur,rub, gallop or click PMI normal Abdomen: benighn, BS positve, no  tenderness, no AAA no bruit.  No HSM or HJR Distal pulses intact with no bruits No edema Neuro non-focal Skin warm and dry    Impression & Recommendations:  Problem # 1:  CAD (ICD-414.00) Stable no angina.  Continue ASA and stressed importance of BB His updated medication list for this problem includes:    Aspirin 325 Mg Tabs (Aspirin) .Marland Kitchen... 1 tab by mouth once daily    Carvedilol 6.25 Mg Tabs (Carvedilol) .Marland Kitchen... 1 tab by mouth two times a day    Lisinopril 10 Mg Tabs (Lisinopril) .Marland Kitchen... Take one tablet by mouth daily  Problem # 2:  BUNDLE BRANCH BLOCK, RIGHT (ICD-426.4) Stable no evidence of high grade block or syncope His updated medication list for this problem includes:    Aspirin 325 Mg Tabs (Aspirin) .Marland Kitchen... 1 tab by mouth once daily    Carvedilol 6.25 Mg Tabs (Carvedilol) .Marland Kitchen... 1 tab by mouth two times a day    Lisinopril 10 Mg Tabs (Lisinopril) .Marland Kitchen... Take one tablet by mouth daily  Problem # 3:  HYPERCHOLESTEROLEMIA (ICD-272.0) F/U labs per primary  Statin D/C due to fatigue and myalgias with marked improvement  Problem # 4:  OBSTRUCTIVE SLEEP APNEA (ICD-327.23) F/U Dr Craige Cotta.  CPAP recently adjusted  Problem # 5:  HYPERTENSION (ICD-401.9) Well controlled.  Stressed compliance owith BB His updated medication list for this problem includes:    Aspirin 325 Mg Tabs (Aspirin) .Marland Kitchen... 1 tab by mouth once daily    Carvedilol 6.25 Mg Tabs (Carvedilol) .Marland Kitchen... 1 tab by mouth two times a day    Lisinopril 10 Mg Tabs (Lisinopril) .Marland Kitchen... Take one tablet by mouth daily  Patient Instructions: 1)  Your physician recommends that you schedule a follow-up appointment in: 6 months 2)  Your physician recommends that you continue on your current medications as directed. Please refer to the Current Medication list given to you today.

## 2011-01-18 NOTE — Progress Notes (Signed)
Summary: Cardiology Phone Note - weakness, irreg. HR  Phone Note Call from Patient   Caller: Spouse Summary of Call: received call from [name] stating that [name] has been feeling weak and fatigued today and has renoted recurrent irreg. heart rhythm (rate of 59) and bp of 90/63.  pt was recently started on multaq for a. fib.  I recommneded that if pt. is feeling poorly than they should consider coming into the ER for evaluation and ecg to reassess rhythm, etc.  They are planning on coming into [facility]. Initial call taken by: Creig Hines, ANP-BC,  January 14, 2010 10:03 PM

## 2011-01-18 NOTE — Assessment & Plan Note (Signed)
Summary: sob/apc   Copy to:  Dr. Charlton Haws Primary Provider/Referring Provider:  Dr. Neva Seat  CC:  Pulmonary consult for shortness of breath. The patient c/o increased sob with exertion and at rest..  History of Present Illness: 70 yo evaluation for dyspnea.  He has been having dyspnea for the past 6 to 9 months.  Really he feels this started after he had bypass surgery in 2007, but has been getting worse recently.  He does not recall any specific event that triggered his decline.    He now feels dyspnea even at rest at times.  He has some trouble walking stairs, and changing his clothes.  He had a pulmonary embolism in 2007 after heart surgery, but recent CT chest showed resolution of this.  He was recently hospitalized for a NSTEMI.  He has occasional cough and wheeze.  He does not produce much sputum, and denies hemoptysis.  He denies allergies, and has not history of asthma.  He denies pneumonia or tuberculosis.  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  He has a Emergency planning/management officer, but no other animal exposure.  He denies travel history, or sick exposures.  He has a right upper lobe granuloma which has been stable on CT chest from 2007 to 2010.  He started smoking at the age of 5, and quit in 1997.  He smoked 1 pack per day.  He has never used inhalers for his breathing.  He has been told he has sleep apnea.  He has not been using CPAP all the time because the mask gives him sinus congestion.     CT of Chest  Procedure date:  05/23/2009  Findings:       Findings: Mediastinal and hilar lymph nodes are not enlarged by CT   size criteria.  Some of these contain calcification.  Heart size   normal.  No pericardial effusion.    Biapical pleural parenchymal scarring.  There are mild changes of   centrilobular emphysema in the upper lobes.  Calcified granulomas   are seen in the right upper lobe.  A subpleural lymph node is seen  along the minor fissure, as on 03/25/2006, and is considered   benign.  No pleural fluid.  Airway is unremarkable.    Incidental imaging of the upper abdomen shows no acute findings.   Old left rib fractures.  No worrisome lytic or sclerotic lesions.    IMPRESSION:   A subpleural lymph node along the minor fissure is unchanged from   03/25/2006 and is considered benign.  CXR  Procedure date:  01/14/2010  Findings:      Findings: Cardiomegaly is present.  Bibasilar opacities not present   on previous examination could represent vascular congestion,   subsegmental atelectatic change, or early infiltrates.  I see no   effusion or pneumothorax.  Previous CABG.  Tortuous calcified   aorta.  Small granuloma noted right midlung zone is stable.  Bones   unremarkable.    IMPRESSION:   Cardiomegaly with new bilateral lower lobe opacities could   represent early infiltrates, edema, or subsegmental atelectasis;   worsening aeration compared with priors.    Preventive Screening-Counseling & Management  Alcohol-Tobacco     Smoking Status: quit     Year Quit: 1997     Pack years: 40 years x1 ppd  Current Medications (verified): 1)  Zocor 20 Mg Tabs (Simvastatin) .Marland Kitchen.. 1 Tab By Mouth Once Daily 2)  Aspirin 325 Mg  Tabs (Aspirin) .Marland Kitchen.. 1 Tab By Mouth Once Daily 3)  Carvedilol 6.25 Mg Tabs (Carvedilol) .Marland Kitchen.. 1 Tab By Mouth Two Times A Day 4)  Flomax 0.4 Mg Xr24h-Cap (Tamsulosin Hcl) .Marland Kitchen.. 1 Tab By Mouth Once Daily 5)  Lisinopril 10 Mg Tabs (Lisinopril) .... Take One Tablet By Mouth Daily 6)  Prilosec 20 Mg Cpdr (Omeprazole) .Marland Kitchen.. 1 Tab By Mouth Once Daily 7)  Fish Oil 1000 Mg Caps (Omega-3 Fatty Acids) .Marland Kitchen.. 1 Cap Once Daily 8)  Garlic .Marland Kitchen.. 1 Tab By Mouth Once Daily 9)  Multaq 400 Mg Tabs (Dronedarone Hcl) .Marland Kitchen.. 1 Tab By Mouth Two Times A Day  Allergies (verified): No Known Drug Allergies  Past History:  Past Medical History: CAD HTN Grade 1 diastolic dysfunction      - Echo Jan. 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 Family History Colon Cancer---sister Family History Lung Cancer---mother Family History Breast Cancer---2 sisters Family History Emphysema ---father Heart disease---father and mother  Social History: Reviewed history from 03/23/2009 and no changes required. Married Retired Lives in [employer]; Quit in 1997, 40 pack year history Non-drinker [place], [place] Status:  quit Pack years:  40 years x1 ppd  Review of Systems       The patient complains of shortness of breath with activity, shortness of breath at rest, acid heartburn, indigestion, nasal congestion/difficulty breathing through nose, and joint stiffness or pain.  The patient denies productive cough, non-productive cough, coughing up blood, chest pain, irregular heartbeats, loss of appetite, weight change, abdominal pain, difficulty swallowing, sore throat, tooth/dental problems, headaches, sneezing, itching, ear ache, anxiety, depression, hand/feet swelling, rash, change in color of mucus, and fever.    Vital Signs:  Patient profile:   70 year old male Height:      71 inches (180.34 cm) Weight:      237.25 pounds (107.84 kg) BMI:     33.21 O2 Sat:      95 % on Room air Temp:     98.1 degrees F (36.72 degrees C) oral Pulse rate:   82 / minute Resp:     95 per minute BP sitting:   102 / 80  (left arm) Cuff size:   regular  Vitals Entered By: Michel Bickers CMA (January 26, 2010 3:22 PM)  O2 Sat at Rest %:  95 O2 Flow:  Room air CC: Pulmonary consult for shortness of breath. The patient c/o increased sob with exertion and at  rest.   Physical Exam  General:  obese.   Eyes:  PERRLA and EOMI.   Nose:  no deformity, discharge, inflammation, or lesions Mouth:  MP 3, no oral lesions Neck:  no JVD.   Chest Wall:  no deformities noted Lungs:  diminished breath sounds, no wheezing or rales Heart:  regular rhythm, normal rate, and no murmurs.   Abdomen:  obese, soft, nontender Pulses:  pulses normal Extremities:  no clubbing, cyanosis, edema, or deformity noted Neurologic:  CN II-XII grossly intact with normal reflexes, coordination, muscle strength and tone Cervical Nodes:  no significant adenopathy Psych:  alert and cooperative; normal mood and affect; normal attention span and concentration   Pulmonary Function  Test Date: 01/26/2010 3:50 PM Gender: Male  Pre-Spirometry FVC    Value: 3.49 L/min   % Pred: 75.80 % FEV1    Value: 2.89 L     Pred: 3.40 L     % Pred: 84.90 % FEV1/FVC  Value: 82.72 %     % Pred: 112.10 %  Impression & Recommendations:  Problem # 1:  DYSPNEA (ICD-786.05) This is likely multifactorial.    He has an extensive history of coronary disease and diastolic dysfunction.  He is being followed by cardiology for this.    He is obese, and likely has a component of deconditioning.  Certainly rehab would be an option for this, pending completion of the remainder of his pulmonary evaluation.  He has an extensive history of tobacco abuse.  He has emphysematous changes on recent CT chest.  However, spirometry today was more suggestive of a restrictive defect.  Of note is that his recent chest xray showed basilar atelectasis versus infiltrates.  To further assess this I will repeat a chest xray today, and arrange for full pulmonary function testing.  I will also give a sample of xopenex to see if he gains symptomatic benefit.  Problem # 2:  OBSTRUCTIVE SLEEP APNEA (ICD-327.23) He has a history of sleep apnea, and has a CPAP machine.  His main complaint today is more related to his dyspnea, and  preferred to address this issue first.  Once the issue of his dyspnea is better addressed, will then focus more on his sleep apnea.  Medications Added to Medication List This Visit: 1)  Garlic  .Marland Kitchen.. 1 tab by mouth once daily 2)  Xopenex Hfa 45 Mcg/act Aero (Levalbuterol tartrate) .... Two puffs up to four times per day as needed  Complete Medication List: 1)  Zocor 20 Mg Tabs (Simvastatin) .Marland Kitchen.. 1 tab by mouth once daily 2)  Aspirin 325 Mg Tabs (Aspirin) .Marland Kitchen.. 1 tab by mouth once daily 3)  Carvedilol 6.25 Mg Tabs (Carvedilol) .Marland Kitchen.. 1 tab by mouth two times a day 4)  Flomax 0.4 Mg Xr24h-cap (Tamsulosin hcl) .Marland Kitchen.. 1 tab by mouth once daily 5)  Lisinopril 10 Mg Tabs (Lisinopril) .... Take one tablet by mouth daily 6)  Prilosec 20 Mg Cpdr (Omeprazole) .Marland Kitchen.. 1 tab by mouth once daily 7)  Fish Oil 1000 Mg Caps (Omega-3 fatty acids) .Marland Kitchen.. 1 cap once daily 8)  Garlic  .Marland Kitchen.. 1 tab by mouth once daily 9)  Multaq 400 Mg Tabs (Dronedarone hcl) .Marland Kitchen.. 1 tab by mouth two times a day 10)  Xopenex Hfa 45 Mcg/act Aero (Levalbuterol tartrate) .... Two puffs up to four times per day as needed  Other Orders: Consultation Level IV (16109) Full Pulmonary Function Test (PFT) T-2 View CXR (71020TC)  Patient Instructions: 1)  Chest xray today 2)  Will schedule breathing test (PFT) 3)  xopenex two puffs up to four times per day as needed for cough, wheeze, congestion, or shortness of breath 4)  Follow up in 2 to 3 weeks   CardioPerfect Spirometry  ID: [id number] Patient: [name], [name] DOB: 18-Aug-1941 Age: 70 Years Old Sex: Male Race: White Height: 71 Weight: 237.25 Status: Confirmed Past Medical History:  CABG 2007 Prostatism Hyperlipidemia Hypertension Lung nodule Recorded: 01/26/2010 3:50 PM  Parameter  Measured Predicted %Predicted FVC     3.49        4.61        75.80 FEV1     2.89        3.40  84.90 FEV1%   82.72        73.81        112.10 PEF    9.95        8.65        115     Interpretation: Pre: FVC= 3.49L FEV1= 2.89L FEV1%= 82.7% 2.89/3.49 FEV1/FVC (01/26/2010 3:54:53 PM), Mild restriction

## 2011-01-18 NOTE — Assessment & Plan Note (Signed)
Summary: rov/pt wants to discuss meds   Referring Provider:  Dr. Charlton Haws Primary Provider:  Dr. Neva Seat  CC:  sob.  History of Present Illness: [name] is seen today for F/U of CAD with CABG in 2007.  He has chronic dyspnea that seems related to sinus and nasal issues.  He and his wife seem fixated on this.  He has normal LV function and no evidence of pulmonary hypertension.  He is a previous smoker.  He has had CT's for the last 3 years for a RLL nodule that has been stable over 3 years.  He is overweight with sleep apnea and wears CPAP at night.  He denies fever, cough, sputum. He gets congested at night.  He gets occasional SSCP with his SOB but it is infrequent and related to his nasal congestion.  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   He thinks the Multaq and Zocor are giving him joint pain and malaise.  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 1 Tab By Mouth Once Daily 2)  Aspirin 325 Mg  Tabs (Aspirin) .Marland Kitchen.. 1 Tab By Mouth Once Daily 3)  Carvedilol 6.25 Mg Tabs (Carvedilol) .Marland Kitchen.. 1 Tab By Mouth Two Times A Day 4)  Flomax 0.4 Mg Xr24h-Cap (Tamsulosin Hcl) .Marland Kitchen.. 1 Tab By Mouth Once Daily 5)  Lisinopril 10 Mg Tabs (Lisinopril) .... Take One Tablet By Mouth Daily 6)  Prilosec 20  Mg Cpdr (Omeprazole) .Marland Kitchen.. 1 Tab By Mouth Once Daily 7)  Fish Oil 1000 Mg Caps (Omega-3 Fatty Acids) .Marland Kitchen.. 1 Cap Once Daily 8)  Garlic .Marland Kitchen.. 1 Tab By Mouth Once Daily 9)  Multaq 400 Mg Tabs (Dronedarone Hcl) .... 1/2 By Mouth Two Times A Day 10)  Omnaris 50 Mcg/act Susp (Ciclesonide) .... Two Sprays Once Daily  Allergies (verified): No Known Drug Allergies  Past History:  Past Medical History: Last updated: 03/29/2010 CAD HTN Grade 1 diastolic dysfunction      - Echo Jan. 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 OSA      - PSG 05/12/08 AHI 53 with Complex apnea      - CPAP 12 cm H2O Dyspnea      - PFT 02/07/10 FVC 3.98(88), FEV1 3.14(103%), FEV1% 79, TLC 7.94(117%), DLCO 84%, no BD   Past Surgical History: Last updated: 01/26/2010 Sigmoid colectomy with colostomy 1992 Left inguinal hernia repair  Laprascopic cholecystectomy 2004 Three vessel coronary artery bypass graft 2007 Ventral hernia repair 2009  Family History: Last updated: 01/26/2010 Family History Colon Cancer---sister Family History Lung Cancer---mother Family History Breast Cancer---2 sisters Family History Emphysema ---father Heart disease---father and mother  Social History: Last updated: 01/26/2010  Married Retired Lives in [employer]; Quit in 1997, 40 pack year history Non-drinker [place], [place]  Review of Systems       Denies fever, , weight loss, blurry vision, decreased visual acuity, cough, sputum, SOB, hemoptysis, pleuritic pain, palpitaitons, heartburn, abdominal pain, melena, lower extremity edema, claudication, or rash.   Vital Signs:  Patient profile:   70 year old male Height:      71 inches Weight:      244 pounds BMI:     34.15 Pulse rate:   73 / minute Resp:     14 per minute BP sitting:   124 / 89  (left arm)  Vitals Entered By: Kem Parkinson (April 12, 2010 8:11 AM)  Physical Exam  General:   Affect appropriate Healthy:  appears stated age HEENT: normal Neck supple with no adenopathy JVP normal no bruits no thyromegaly Lungs clear with no wheezing and good diaphragmatic motion Heart:  S1/S2 no murmur,rub, gallop or click PMI normal Abdomen: benighn, BS positve, no tenderness, no AAA no bruit.  No HSM or HJR Distal pulses intact with no bruits No edema Neuro non-focal Skin warm and dry    Impression & Recommendations:  Problem # 1:  ATRIAL FIBRILLATION (ICD-427.31) Maint NSR.  D/C multaq given liver risk The following medications were removed from the medication list:    Multaq 400 Mg Tabs (Dronedarone hcl) .Marland Kitchen... 1/2 by mouth two times a day His updated medication list for this problem includes:    Aspirin 325 Mg Tabs (Aspirin) .Marland Kitchen... 1 tab by mouth once daily    Carvedilol 6.25 Mg Tabs (Carvedilol) .Marland Kitchen... 1 tab by mouth two times a day  Problem # 2:  DYSPNEA (ICD-786.05) Continue CP"AP and F/U pulmonary His updated medication list for this problem includes:    Aspirin 325 Mg Tabs (Aspirin) .Marland Kitchen... 1 tab by mouth once daily    Carvedilol 6.25 Mg Tabs (Carvedilol) .Marland Kitchen... 1 tab by mouth two times a day    Lisinopril 10 Mg Tabs (Lisinopril) .Marland Kitchen... Take one tablet by mouth daily  His updated medication list for this problem includes:    Aspirin 325 Mg Tabs (Aspirin) .Marland Kitchen... 1 tab by mouth once daily    Carvedilol 6.25 Mg Tabs (Carvedilol) .Marland Kitchen... 1 tab by mouth two times a day    Lisinopril 10 Mg Tabs (Lisinopril) .Marland Kitchen... Take one tablet by mouth daily  Problem # 3:  HYPERCHOLESTEROLEMIA (ICD-272.0) Re check loabs off zocor and see how joint pain does The following medications were removed from the medication list:    Zocor 20 Mg Tabs (Simvastatin) .Marland Kitchen... 1 tab by mouth once daily  Problem # 4:  CAD (ICD-414.00) CABG in 2007  no angina.  Continue ASA and BB His updated medication list for this problem includes:    Aspirin 325 Mg Tabs (Aspirin) .Marland Kitchen... 1 tab by mouth once  daily    Carvedilol 6.25 Mg Tabs (Carvedilol) .Marland Kitchen... 1 tab by mouth two times a day    Lisinopril 10 Mg Tabs (Lisinopril) .Marland Kitchen... Take one tablet by mouth daily  Patient Instructions: 1)  Your physician recommends that you schedule a follow-up appointment in: 6-8 weeks. 2)  Your physician has recommended you make the following change in your medication: 1) STOP Multaq, 2) STOP Zocor.

## 2011-01-18 NOTE — Miscellaneous (Signed)
Summary: auto CPAP 04/03/10 to 04/19/10  Clinical Lists Changes Used on 17 of 18 nights with average 4hrs 15 min.  The 95th percentile pressure was 12 cm, but AHI was only 3.  Will have my nurse call to inform pt that CPAP report looked good, and will send order to his DME to decrease pressure from 12 cm to 10 cm.  He is to call if he has problems after pressure change.  Otherwise we will schedule a follow up appointment in 2 to 3 months. Orders: Added new Referral order of DME Referral (DME) - Signed  Appended Document: auto CPAP 04/03/10 to 04/19/10 LMOMTCB.  Appended Document: auto CPAP 04/03/10 to 04/19/10 pt advised per append and reminder placed for appt.

## 2011-01-22 ENCOUNTER — Ambulatory Visit (INDEPENDENT_AMBULATORY_CARE_PROVIDER_SITE_OTHER): Payer: Medicare Other | Admitting: Cardiovascular Disease

## 2011-01-22 ENCOUNTER — Encounter: Payer: Self-pay | Admitting: Cardiovascular Disease

## 2011-01-22 DIAGNOSIS — I498 Other specified cardiac arrhythmias: Secondary | ICD-10-CM | POA: Insufficient documentation

## 2011-01-22 DIAGNOSIS — R001 Bradycardia, unspecified: Secondary | ICD-10-CM | POA: Insufficient documentation

## 2011-01-22 DIAGNOSIS — I4949 Other premature depolarization: Secondary | ICD-10-CM

## 2011-01-22 DIAGNOSIS — I2510 Atherosclerotic heart disease of native coronary artery without angina pectoris: Secondary | ICD-10-CM

## 2011-02-01 NOTE — Assessment & Plan Note (Signed)
Summary: PAST DUE FIR 6 MTH F/U PER PT PHONE CALL/TG/BH/ notes on file...   Referring Provider:  Dr. Charlton Haws Primary Provider:  Dr. Neva Seat   History of Present Illness: [name] is seen today for F/U of CAD with CABG in 2007.  He has chronic dyspnea that seems related to sinus and nasal issues.  He and his wife seem fixated on this.  He has normal LV function and no evidence of pulmonary hypertension.  He is a previous smoker.  He has had CT's for the last 3 years for a RLL nodule that has been stable over 3 years.  He is overweight with sleep apnea and wears CPAP at night.  His pressure has been decreased recently to 10 mmHG  He denies fever, cough, sputum. He gets congested at night.  He appears to have lost some weight by scales but still has a way to go.   He has a chronic RBBB and PR of 212 msec,  with no evidence of high grade heart block  His pulse was a little high today and he had not taken his coreg  Current Medications (verified): 1)  Aspirin 325 Mg  Tabs (Aspirin) .Marland Kitchen.. 1 Tab By Mouth Once Daily 2)  Carvedilol 6.25 Mg Tabs (Carvedilol) .Marland Kitchen.. 1 Tab By Mouth Two Times A Day 3)  Flomax 0.4 Mg Xr24h-Cap (Tamsulosin Hcl) .Marland Kitchen.. 1 Tab By Mouth Once Daily 4)  Lisinopril 10 Mg Tabs (Lisinopril) .... Take One Tablet By Mouth Daily 5)  Fish Oil 1000 Mg Caps (Omega-3 Fatty Acids) .Marland Kitchen.. 1 Cap Once Daily 6)  Garlic .Marland Kitchen.. 1 Tab By Mouth Once Daily 7)  Omnaris 50 Mcg/act Susp (Ciclesonide) .... As Needed 8)  Zyrtec Allergy 10 Mg Tabs (Cetirizine Hcl) .... Take As Needed  Allergies: No Known Drug Allergies  Vital Signs:  Patient profile:   70 year old male Height:      71 inches Weight:      229 pounds BMI:     32.05 Pulse rate:   85 / minute Resp:     14 per minute BP sitting:   106 / 72  (left arm)  Vitals Entered By: Kem Parkinson (January 22, 2011 12:15 PM)    Impression & Recommendations:  Problem # 1:  DYSPNEA (ICD-786.05) Functional.  No cardiac etiology.   Encouraged him to be more compliant with CPAP His updated medication list for this problem includes:    Aspirin 325 Mg Tabs (Aspirin) .Marland Kitchen... 1 tab by mouth once daily    Carvedilol 6.25 Mg Tabs (Carvedilol) .Marland Kitchen... 1 tab by mouth two times a day    Lisinopril 10 Mg Tabs (Lisinopril) .Marland Kitchen... Take one tablet by mouth daily  Problem # 2:  BRADYCARDIA (ICD-427.89) Asymptomatic with chronic RBBB and no AV block  Follow His updated medication list for this problem includes:    Aspirin 325 Mg Tabs (Aspirin) .Marland Kitchen... 1 tab by mouth once daily    Carvedilol 6.25 Mg Tabs (Carvedilol) .Marland Kitchen... 1 tab by mouth two times a day    Lisinopril 10 Mg Tabs (Lisinopril) .Marland Kitchen... Take one tablet by mouth daily  Orders: Event (Event)  Problem # 3:  HYPERTENSION (ICD-401.9) Well controlled His updated medication list for this problem includes:    Aspirin 325 Mg Tabs (Aspirin) .Marland Kitchen... 1 tab by mouth once daily    Carvedilol 6.25 Mg Tabs (Carvedilol) .Marland Kitchen... 1 tab by mouth two times a day    Lisinopril 10 Mg Tabs (Lisinopril) .Marland Kitchen... Take  one tablet by mouth daily  Problem # 4:  CAD (ICD-414.00) Stable with no angina.  Continue asa and BB His updated medication list for this problem includes:    Aspirin 325 Mg Tabs (Aspirin) .Marland Kitchen... 1 tab by mouth once daily    Carvedilol 6.25 Mg Tabs (Carvedilol) .Marland Kitchen... 1 tab by mouth two times a day    Lisinopril 10 Mg Tabs (Lisinopril) .Marland Kitchen... Take one tablet by mouth daily  Patient Instructions: 1)  Your physician recommends that you schedule a follow-up appointment in: 3-4 weeks 2)  Your physician has recommended that you wear an event monitor.  Event monitors are medical devices that record the heart's electrical activity. Doctors most often use these monitors to diagnose arrhythmias. Arrhythmias are problems with the speed or rhythm of the heartbeat. The monitor is a small, portable device. You can wear one while you do your normal daily activities. This is usually used to diagnose what is  causing palpitations/syncope (passing out).

## 2011-02-06 ENCOUNTER — Emergency Department (HOSPITAL_COMMUNITY): Payer: Medicare Other

## 2011-02-06 ENCOUNTER — Emergency Department (HOSPITAL_COMMUNITY)
Admission: EM | Admit: 2011-02-06 | Discharge: 2011-02-06 | Disposition: A | Discharge status: Home / Self Care | Payer: Medicare Other | Attending: Emergency Medicine | Admitting: Emergency Medicine

## 2011-02-06 DIAGNOSIS — I10 Essential (primary) hypertension: Secondary | ICD-10-CM | POA: Insufficient documentation

## 2011-02-06 DIAGNOSIS — I25810 Atherosclerosis of coronary artery bypass graft(s) without angina pectoris: Secondary | ICD-10-CM | POA: Insufficient documentation

## 2011-02-06 DIAGNOSIS — R42 Dizziness and giddiness: Secondary | ICD-10-CM | POA: Insufficient documentation

## 2011-02-06 DIAGNOSIS — I4949 Other premature depolarization: Secondary | ICD-10-CM | POA: Insufficient documentation

## 2011-02-06 DIAGNOSIS — R55 Syncope and collapse: Secondary | ICD-10-CM | POA: Insufficient documentation

## 2011-02-06 DIAGNOSIS — Z85038 Personal history of other malignant neoplasm of large intestine: Secondary | ICD-10-CM | POA: Insufficient documentation

## 2011-02-06 LAB — CBC
MCV: 83.5 fL (ref 78.0–100.0)
Platelets: 166 10*3/uL (ref 150–400)
RBC: 5.75 MIL/uL (ref 4.22–5.81)
WBC: 13.8 10*3/uL — ABNORMAL HIGH (ref 4.0–10.5)

## 2011-02-06 LAB — POCT CARDIAC MARKERS: Troponin i, poc: <0.05 ng/mL (ref 0.00–0.09)

## 2011-02-06 LAB — DIFFERENTIAL
Basophils Absolute: 0 10*3/uL (ref 0.0–0.1)
Eosinophils Absolute: 0.1 10*3/uL (ref 0.0–0.7)
Lymphocytes Relative: 11 % — ABNORMAL LOW (ref 12–46)
Lymphs Abs: 1.5 10*3/uL (ref 0.7–4.0)
Neutrophils Relative %: 81 % — ABNORMAL HIGH (ref 43–77)

## 2011-02-06 LAB — BASIC METABOLIC PANEL
BUN: 17 mg/dL (ref 6–23)
Chloride: 105 mEq/L (ref 96–112)
Potassium: 4.2 mEq/L (ref 3.5–5.1)
Sodium: 136 mEq/L (ref 135–145)

## 2011-02-21 ENCOUNTER — Encounter: Payer: Self-pay | Admitting: Physician Assistant

## 2011-02-21 ENCOUNTER — Ambulatory Visit (INDEPENDENT_AMBULATORY_CARE_PROVIDER_SITE_OTHER): Payer: Medicare Other | Admitting: Physician Assistant

## 2011-02-21 DIAGNOSIS — R002 Palpitations: Secondary | ICD-10-CM

## 2011-02-21 DIAGNOSIS — R55 Syncope and collapse: Secondary | ICD-10-CM

## 2011-02-21 DIAGNOSIS — I4949 Other premature depolarization: Secondary | ICD-10-CM

## 2011-02-22 ENCOUNTER — Ambulatory Visit: Payer: Medicare Other | Admitting: Cardiovascular Disease

## 2011-02-27 NOTE — Assessment & Plan Note (Signed)
Summary: palps, sob, weakness   Visit Type:  Follow-up Referring Provider:  Dr. Charlton Haws Primary Provider:  Dr. Neva Seat  CC:  pt stated feels like his heart is skipping a  beat and also has SOB.  History of Present Illness: Primary Cardiologist:  Dr. Charlton Haws  [name] is a 70 yo male with a h/o CAD, status post CABG in 2007, A. fib, status post type II NSTEMI in January 2011 in the setting of A. fib with RVR, previous Multaq therapy, RBBB, chronic dyspnea, RLL nodule stable by chest CT for the last 3 years, sleep apnea, HTN, hyperlipidemia, GERD and remote history of pulmonary embolism after CABG in 2007.  Last heart catheterization in January 2011 demonstrated a patent S-RCA with prox 30-40% and 50% stenoses; S-OM and ramus intermediate with patent OM limb but an occluded ramus intermediate limb, S-D2 and LIMA-LAD patent with an EF of 55%.  Echo 1/ 11 demonstrated an EF of 55-60%; mild LVH; focal basal septal hypertrophy; grade 1 diastolic dysfunction and mild BAE and RVE.    He saw Dr. Eden Emms in early February 2012.  He was to be placed on an event monitor.  I do not have a record of that at this time.  He did have a visit to the emergency room 2/21 with syncope.  His blood pressure was noted to be low and he was treated with IV fluids.  He was diagnosed with vasovagal syncope.  He presents today with complaints of a skipping sensation.  This was part of the reason why he was placed on a monitor.  These episodes may last for 2-3 minutes.  He feels very weak with him.  He denies feeling near syncopal.  He has not had any syncope associated with his palpitations.  The day that he presented to the emergency room, he had what sounds like a gastroenteritis.  He had diarrhea that entire day.  His symptoms did improve with IV fluids.  He notes increased shortness of breath with his palpitations.  He denies increased dyspnea with exertion.  He is probable NYHA class II.  He denies  insomnia, PND or edema.  Current Medications (verified): 1)  Aspirin 325 Mg  Tabs (Aspirin) .Marland Kitchen.. 1 Tab By Mouth Once Daily 2)  Carvedilol 6.25 Mg Tabs (Carvedilol) .Marland Kitchen.. 1 Tab By Mouth Two Times A Day 3)  Flomax 0.4 Mg Xr24h-Cap (Tamsulosin Hcl) .Marland Kitchen.. 1 Tab By Mouth Once Daily 4)  Lisinopril 10 Mg Tabs (Lisinopril) .... As Needed 5)  Fish Oil 1000 Mg Caps (Omega-3 Fatty Acids) .Marland Kitchen.. 1 Cap Once Daily 6)  Garlic .Marland Kitchen.. 1 Tab By Mouth Once Daily 7)  Omnaris 50 Mcg/act Susp (Ciclesonide) .... As Needed 8)  Zyrtec Allergy 10 Mg Tabs (Cetirizine Hcl) .... Take As Needed 9)  Simvastatin 20 Mg Tabs (Simvastatin) .... Take One Tablet By Mouth Daily  Allergies (verified): No Known Drug Allergies  Past History:  Past Medical History: Reviewed history from 03/29/2010 and no changes required. CAD HTN Grade 1 diastolic dysfunction      - Echo Jan. 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  All other systems reviewed and negative.   Vital Signs:  Patient profile:   70 year old male Height:      71 inches Weight:      231.50 pounds BMI:     32.40 Pulse rate:   47 / minute Pulse (ortho):   90 / minute BP sitting:   128 / 67  (left arm) BP standing:   129 / 86 Cuff size:   regular  Vitals Entered By: Caralee Ates CMA (February 21, 2011 11:59 AM)  Serial Vital Signs/Assessments:  Time      Position  BP       Pulse  Resp  Temp     By           Lying RA  130/83   82                    Scott Weaver PA-C           Sitting    125/83   85                    Scott Weaver PA-C           Standing  129/86   90                    Scott Weaver PA-C  Comments: after 2 minutes, BP 131/86 and pulse 92 After 5 minutes, BP 133/86 and pulse 89 By: Tereso Newcomer PA-C    Physical Exam  General:  Well nourished, well developed, in no acute distress HEENT: normal Neck: no JVD Cardiac:  normal S1, S2; RRR; no murmur; +S3 Lungs:  clear to auscultation bilaterally, no wheezing, rhonchi or rales Abd: soft, nontender, no hepatomegaly Ext: no  edema Endo: no thyromegaly Skin: warm and dry Neuro:  CNs 2-12 intact, no focal abnormalities noted    EKG  Procedure date:  02/21/2011  Findings:      normal sinus rhythm Right bundle branch block Heart rate 84 PR interval 202 ms No significant changes previous tracing  Impression & Recommendations:  Problem # 1:  PALPITATIONS (ICD-785.1) I reviewed his strips that are available from his event monitor.  He will complete this at the end of this week.  He has sinus rhythm and sinus bradycardia with heart rates ranging from the 50s to 80s.  He does have frequent PVCs.  He also has bigeminy at times.  He has at least 1-2 strips of documented ventricular couplets.  There is one strip that documents a ventricular triplet.  At the time of his syncopal episode, we lost transmission.  However, it appears that he remained in normal rhythm throughout the episode.  I reviewed his strips with Dr. Graciela Husbands today.  I will set him up with an echocardiogram.  He will complete his event monitor.  He will be brought back in close followup in the next couple of weeks.  Of note, he prefers to not followup with Dr. Eden Emms.  I will establish him with Dr. Shirlee Latch.  If his LV function is down, he will likely require cardiac catheterization.  He does not take his carvedilol on a regular basis.  He cannot tolerate it.  I have asked him to  decrease this to 3.125 mg twice a day.  Hopefully he will be able to  tolerate this.  I question whether or not he has symptomatic PVCs that contribute to a lot of his symptoms.  Orders: Echocardiogram (Echo)  Problem # 2:  HYPERTENSION (ICD-401.9) He has not really taken his lisinopril since he went to the emergency room.  He took it once due to high blood pressure.  I've asked him to remain off of this for now.  If he notices his blood pressures are increasing, he can certainly restart it and notify us.  Problem # 3:  SYNCOPE (ICD-780.2) As noted, I reviewed his strips with Dr. Graciela Husbands.  It appears that he did remain in a normal rhythm throughout his syncopal event.  He likely had a vagal event with recent onset of diarrhea and hypotension documented in the emergency room.  His symptoms did improve with fluid resuscitation.  Problem # 4:  CAD (ICD-414.00) Is not having angina.  However, if he has LV dysfunction he will likely need cardiac catheterization.  Problem # 5:  ATRIAL FIBRILLATION (ICD-427.31) Maintaining normal sinus rhythm.  He is no longer on Multaq as he could not tolerate it.  Orders: EKG w/ Interpretation (93000) Echocardiogram (Echo)  Patient Instructions: 1)  Your physician recommends that you schedule a follow-up appointment in: 5 to 6 weeks with Dr. Shirlee Latch and [place] weaver on 3/28 29 or 30 2)  Your physician has recommended you make the following change in your medication: decrease Coreg to one half a tab 2 times per day and hold Lisinopril 3)  Your physician has requested that you have an echocardiogram.  Echocardiography is a painless test that uses sound waves to create images of your heart. It provides your doctor with information about the size and shape of your heart and how well your heart's chambers and valves are working.  This procedure takes approximately one hour. There are no restrictions for this procedure.

## 2011-02-28 ENCOUNTER — Encounter: Payer: Self-pay | Admitting: Cardiovascular Disease

## 2011-03-04 LAB — BASIC METABOLIC PANEL
BUN: 11 mg/dL (ref 6–23)
BUN: 12 mg/dL (ref 6–23)
CO2: 25 mEq/L (ref 19–32)
CO2: 27 mEq/L (ref 19–32)
CO2: 29 mEq/L (ref 19–32)
Chloride: 105 mEq/L (ref 96–112)
Chloride: 103 mEq/L (ref 96–112)
Chloride: 106 mEq/L (ref 96–112)
Creatinine, Ser: 1.11 mg/dL (ref 0.4–1.5)
Creatinine, Ser: 1.31 mg/dL (ref 0.4–1.5)
GFR calc Af Amer: >60 mL/min (ref >60)
GFR calc Af Amer: >60 mL/min (ref >60)
GFR calc non Af Amer: >60 mL/min (ref >60)
Glucose, Bld: 103 mg/dL — ABNORMAL HIGH (ref 70–99)
Glucose, Bld: 110 mg/dL — ABNORMAL HIGH (ref 70–99)
Potassium: 3.7 mEq/L (ref 3.5–5.1)
Potassium: 3.4 mEq/L — ABNORMAL LOW (ref 3.5–5.1)
Potassium: 4.2 mEq/L (ref 3.5–5.1)
Sodium: 137 mEq/L (ref 135–145)
Sodium: 138 mEq/L (ref 135–145)

## 2011-03-04 LAB — PROTIME-INR: INR: 1.14 (ref 0.00–1.49)

## 2011-03-04 LAB — URINALYSIS, ROUTINE W REFLEX MICROSCOPIC
Bilirubin Urine: NEGATIVE
Ketones, ur: NEGATIVE mg/dL
Nitrite: NEGATIVE
Urobilinogen, UA: 0.2 mg/dL (ref 0.0–1.0)

## 2011-03-04 LAB — DIFFERENTIAL
Basophils Absolute: 0 10*3/uL (ref 0.0–0.1)
Basophils Absolute: 0 10*3/uL (ref 0.0–0.1)
Basophils Relative: 0 % (ref 0–1)
Eosinophils Absolute: 0.1 10*3/uL (ref 0.0–0.7)
Eosinophils Absolute: 0.1 10*3/uL (ref 0.0–0.7)
Eosinophils Absolute: 0.1 10*3/uL (ref 0.0–0.7)
Eosinophils Relative: 1 % (ref 0–5)
Eosinophils Relative: 1 % (ref 0–5)
Eosinophils Relative: 2 % (ref 0–5)
Lymphocytes Relative: 31 % (ref 12–46)
Lymphocytes Relative: 36 % (ref 12–46)
Lymphs Abs: 2.3 10*3/uL (ref 0.7–4.0)
Monocytes Absolute: 0.8 10*3/uL (ref 0.1–1.0)
Monocytes Relative: 11 % (ref 3–12)
Neutrophils Relative %: 59 % (ref 43–77)
Neutrophils Relative %: 58 % (ref 43–77)

## 2011-03-04 LAB — COMPREHENSIVE METABOLIC PANEL
ALT: 28 U/L (ref 0–53)
AST: 21 U/L (ref 0–37)
CO2: 28 mEq/L (ref 19–32)
Chloride: 104 mEq/L (ref 96–112)
Creatinine, Ser: 1.14 mg/dL (ref 0.4–1.5)
GFR calc Af Amer: >60 mL/min (ref >60)
GFR calc non Af Amer: >60 mL/min (ref >60)
Glucose, Bld: 76 mg/dL (ref 70–99)
Total Bilirubin: 0.6 mg/dL (ref 0.3–1.2)

## 2011-03-04 LAB — CBC
HCT: 41.6 % (ref 39.0–52.0)
HCT: 46.6 % (ref 39.0–52.0)
Hemoglobin: 15.1 g/dL (ref 13.0–17.0)
Hemoglobin: 13.8 g/dL (ref 13.0–17.0)
Hemoglobin: 14.9 g/dL (ref 13.0–17.0)
MCH: 29 pg (ref 26.0–34.0)
MCHC: 33.3 g/dL (ref 30.0–36.0)
MCHC: 33.3 g/dL (ref 30.0–36.0)
MCV: 86.9 fL (ref 78.0–100.0)
MCV: 86.1 fL (ref 78.0–100.0)
MCV: 87 fL (ref 78.0–100.0)
Platelets: 196 10*3/uL (ref 150–400)
RBC: 5.22 MIL/uL (ref 4.22–5.81)
RBC: 5.2 MIL/uL (ref 4.22–5.81)
RDW: 15.2 % (ref 11.5–15.5)
RDW: 15.4 % (ref 11.5–15.5)
WBC: 7.8 10*3/uL (ref 4.0–10.5)
WBC: 4.8 10*3/uL (ref 4.0–10.5)

## 2011-03-04 LAB — POCT CARDIAC MARKERS
CKMB, poc: 1.9 ng/mL (ref 1.0–8.0)
CKMB, poc: <1 ng/mL — ABNORMAL LOW (ref 1.0–8.0)
Myoglobin, poc: 59.2 ng/mL (ref 12–200)
Troponin i, poc: <0.05 ng/mL (ref 0.00–0.09)
Troponin i, poc: <0.05 ng/mL (ref 0.00–0.09)

## 2011-03-04 LAB — POCT URINALYSIS DIP (DEVICE)
Hgb urine dipstick: NEGATIVE
Nitrite: NEGATIVE
Protein, ur: NEGATIVE mg/dL
Urobilinogen, UA: 0.2 mg/dL (ref 0.0–1.0)
pH: 5.5 (ref 5.0–8.0)

## 2011-03-04 LAB — BRAIN NATRIURETIC PEPTIDE
Pro B Natriuretic peptide (BNP): 350 pg/mL — ABNORMAL HIGH (ref 0.0–100.0)
Pro B Natriuretic peptide (BNP): 64 pg/mL (ref 0.0–100.0)

## 2011-03-04 LAB — MRSA PCR SCREENING: MRSA by PCR: NEGATIVE

## 2011-03-04 LAB — TROPONIN I: Troponin I: 0.14 ng/mL — ABNORMAL HIGH (ref 0.00–0.06)

## 2011-03-04 LAB — HEPARIN LEVEL (UNFRACTIONATED): Heparin Unfractionated: <0.1 IU/mL — ABNORMAL LOW (ref 0.30–0.70)

## 2011-03-04 LAB — TSH: TSH: 1.071 u[IU]/mL (ref 0.350–4.500)

## 2011-03-04 LAB — CK TOTAL AND CKMB (NOT AT ARMC)
CK, MB: 2.8 ng/mL (ref 0.3–4.0)
Total CK: 122 U/L (ref 7–232)

## 2011-03-09 ENCOUNTER — Telehealth: Payer: Self-pay | Admitting: *Deleted

## 2011-03-09 NOTE — Telephone Encounter (Signed)
Spoke with pt, event monitor shows sinus with PVC's no sig arrhythmia

## 2011-03-13 ENCOUNTER — Other Ambulatory Visit (HOSPITAL_COMMUNITY): Payer: Self-pay | Admitting: Cardiology

## 2011-03-13 DIAGNOSIS — R002 Palpitations: Secondary | ICD-10-CM

## 2011-03-14 ENCOUNTER — Ambulatory Visit: Payer: Medicare Other | Admitting: Cardiovascular Disease

## 2011-03-14 ENCOUNTER — Other Ambulatory Visit: Payer: Self-pay | Admitting: Internal Medicine

## 2011-03-15 ENCOUNTER — Ambulatory Visit (HOSPITAL_COMMUNITY): Payer: Medicare Other | Attending: Cardiology | Admitting: Radiology

## 2011-03-15 ENCOUNTER — Other Ambulatory Visit (HOSPITAL_COMMUNITY): Payer: Medicare Other | Admitting: Radiology

## 2011-03-15 ENCOUNTER — Ambulatory Visit (INDEPENDENT_AMBULATORY_CARE_PROVIDER_SITE_OTHER): Payer: Medicare Other | Admitting: Physician Assistant

## 2011-03-15 ENCOUNTER — Encounter: Payer: Self-pay | Admitting: Physician Assistant

## 2011-03-15 VITALS — BP 140/92 | HR 66 | Ht 71.0 in | Wt 231.0 lb

## 2011-03-15 DIAGNOSIS — I252 Old myocardial infarction: Secondary | ICD-10-CM | POA: Insufficient documentation

## 2011-03-15 DIAGNOSIS — I10 Essential (primary) hypertension: Secondary | ICD-10-CM

## 2011-03-15 DIAGNOSIS — E669 Obesity, unspecified: Secondary | ICD-10-CM | POA: Insufficient documentation

## 2011-03-15 DIAGNOSIS — R002 Palpitations: Secondary | ICD-10-CM | POA: Insufficient documentation

## 2011-03-15 DIAGNOSIS — R55 Syncope and collapse: Secondary | ICD-10-CM | POA: Insufficient documentation

## 2011-03-15 DIAGNOSIS — Z87891 Personal history of nicotine dependence: Secondary | ICD-10-CM | POA: Insufficient documentation

## 2011-03-15 DIAGNOSIS — I2510 Atherosclerotic heart disease of native coronary artery without angina pectoris: Secondary | ICD-10-CM | POA: Insufficient documentation

## 2011-03-15 DIAGNOSIS — E785 Hyperlipidemia, unspecified: Secondary | ICD-10-CM | POA: Insufficient documentation

## 2011-03-15 DIAGNOSIS — I4891 Unspecified atrial fibrillation: Secondary | ICD-10-CM

## 2011-03-15 DIAGNOSIS — R0602 Shortness of breath: Secondary | ICD-10-CM | POA: Insufficient documentation

## 2011-03-15 MED ORDER — ATENOLOL 50 MG PO TABS: 50.0000 mg | ORAL_TABLET | Freq: Every day | ORAL | Status: DC

## 2011-03-15 MED ORDER — BISOPROLOL FUMARATE 5 MG PO TABS: 5.0000 mg | ORAL_TABLET | Freq: Every day | ORAL | Status: DC

## 2011-03-15 MED ORDER — DABIGATRAN ETEXILATE MESYLATE 150 MG PO CAPS: 150.0000 mg | ORAL_CAPSULE | Freq: Two times a day (BID) | ORAL | Status: DC

## 2011-03-15 MED ORDER — METOPROLOL SUCCINATE ER 50 MG PO TB24: 50.0000 mg | ORAL_TABLET | Freq: Every day | ORAL | Status: DC

## 2011-03-15 NOTE — Assessment & Plan Note (Signed)
His cardiac catheterization in January 2011 demonstrated stable anatomy.  He denies any angina.  He can decrease his aspirin to 81 mg a day with the addition of Pradaxa.

## 2011-03-15 NOTE — Assessment & Plan Note (Signed)
He is in sinus rhythm today.  He had sinus rhythm on the monitor.  However, he does have a CHADS2-VASc Score of 3.  After discussion with Dr. Shirlee Latch, we think he should be on anticoagulation therapy.  He is at a normal creatinine in the past.  He denies any significant bleeding problems.  Therefore, I will place him on Pradaxa 150 mg twice a day.  We gave him samples today.

## 2011-03-15 NOTE — Assessment & Plan Note (Signed)
He had an echocardiogram performed today.  This demonstrated an ejection fraction of 50% with inferior hypokinesis.  There was a small echo bright density at the apex that is mobile and probably represents a fibrotic endocardium.  Small thrombus cannot be excluded.  He had grade 1 diastolic dysfunction.  I reviewed his echocardiogram with Dr. Shirlee Latch.  He did not feel that this represents a thrombus.  I have asked the patient to stop his carvedilol.  I gave him a prescription for Toprol, atenolol and bisoprolol.  He is to try one of these for one to 2 weeks to see if he can tolerate.  If he cannot tolerate one he will discard and move on to the next beta blocker.  He has followup with Dr. Shirlee Latch in a couple of weeks.  He can review his response to his beta blocker trial with him at that time.  If he has no good response to a trial of beta blockers, we could consider having him see one of our electrophysiologists for further recommendations.

## 2011-03-15 NOTE — Patient Instructions (Signed)
Your physician recommends that you schedule a follow-up appointment [id number] YOUR APPOINTMENT WITH DR. Shirlee Latch AS SCHEDULED ALREADY  Your physician has recommended you make the following change in your medication:3 PRESCRIPTIONS HAVE BEEN SENT IN FOR YOU TO TRY. TOPROL 50 MG, ATENOLOL 50 MG, BISOPROLOL 5 MG, PRADAXA 150 MG. DECREASE YOUR ASPIRIN TO 81 MG 1 TAB DAILY

## 2011-03-15 NOTE — Progress Notes (Signed)
History of Present Illness: [name] is a 70 yo male with a h/o CAD, status post CABG in 2007, A. fib, status post type II NSTEMI in January 2011 in the setting of A. fib with RVR, previous Multaq therapy, RBBB, chronic dyspnea, RLL nodule stable by chest CT for the last 3 years, sleep apnea, HTN, hyperlipidemia, GERD and remote history of pulmonary embolism after CABG in 2007.  Last heart catheterization in January 2011 demonstrated a patent S-RCA with prox 30-40% and 50% stenoses; S-OM and ramus intermediate with patent OM limb but an occluded ramus intermediate limb, S-D2 and LIMA-LAD patent with an EF of 55%.  Echo 1/ 11 demonstrated an EF of 55-60%; mild LVH; focal basal septal hypertrophy; grade 1 diastolic dysfunction and mild BAE and RVE.    He saw Dr. Eden Emms in early February 2012.  He was to be placed on an event monitor.   I saw him a couple weeks ago for palpitations and reviewed his available strips.  He had had a recent syncopal episode that was likely vaso vagal.  He had a lot of PVCs.  We set him up for an echo which was done today.  He completed his monitor and the remainder demonstrated a lot of PVCs, sinus rhythm and no significant arrhythmias.  I had him change his coreg and hold his lisinopril.  His BP had been low at one point.  Of note, he will further follow with Dr. Shirlee Latch.  He returns for follow up.  He continues to have palpitations or skipping sensation.  He notes it mainly with rest.  If he is active he does not have any symptoms.  He feels somewhat short of breath with his palpitations.  He continues to feel like it is a skipping sensation.  He denies chest pain.  He denies syncope.  Denies orthopnea or PND.  He denies significant pedal edema.  He denies any tachycardia palpitations.   Past Medical History  Diagnosis Date  . CAD (coronary artery disease)     s/p CABG 2007; NSTEMI in setting of AFib with RVR 12/2009; cath 1/11: S-RCA ok with prox 30-40% and 50% stenoses;  S-OM and RI  with patent OM limb but an occluded RI limb; S-D2 and L-LAD ok; EF 55%  . HTN (hypertension)   . Diastolic dysfunction     Echo 8/11:  EF 55-60%; mild LVH; focal basal septal hypertrophy; grade 1 diast.dysfxn; mild BAE and RVE  . GERD (gastroesophageal reflux disease)   . Colon cancer   . BPH (benign prostatic hyperplasia)   . PE (pulmonary embolism) 2007    after CABG  . Lung granuloma     Right upper  . OSA on CPAP   . Dyspnea   . Atrial fibrillation     previously on Multaq  . RBBB (right bundle branch block)   . Hyperlipidemia     Current Outpatient Prescriptions  Medication Sig Dispense Refill  . aspirin 325 MG tablet Take 325 mg by mouth daily.        . carvedilol (COREG) 6.25 MG tablet Take 6.25 mg by mouth 2 (two) times daily with a meal. 1/2 tablet      . cetirizine (ZYRTEC) 10 MG tablet Take 10 mg by mouth daily.        . clonazePAM (KLONOPIN) 0.5 MG tablet       . fish oil-omega-3 fatty acids 1000 MG capsule Take 2 g by mouth daily.        Marland Kitchen  Garlic 100 MG TABS Take by mouth daily.        . metroNIDAZOLE (METROCREAM) 0.75 % cream       . simvastatin (ZOCOR) 20 MG tablet 1 tablet Daily.      . Tamsulosin HCl (FLOMAX) 0.4 MG CAPS Take 0.4 mg by mouth daily.        Marland Kitchen DISCONTD: lisinopril (PRINIVIL,ZESTRIL) 10 MG tablet Take 10 mg by mouth daily.       Marland Kitchen DISCONTD: ciclesonide (OMNARIS) 50 MCG/ACT nasal spray 2 sprays by Each Nare route as needed.          No Known Allergies  Vital Signs: BP 140/92  Pulse 66  Ht 5\' 11"  (1.803 m)  Wt 231 lb (104.781 kg)  BMI 32.22 kg/m2  PHYSICAL EXAM: Well nourished, well developed, in no acute distress HEENT: normal Neck: no JVD Cardiac:  normal S1, S2; RRR; no murmur Lungs:  clear to auscultation bilaterally, no wheezing, rhonchi or rales Abd: soft, nontender, no hepatomegaly Ext: no edema Skin: warm and dry Neuro:  CNs 2-12 intact, no focal abnormalities noted  ASSESSMENT AND PLAN:

## 2011-03-15 NOTE — Assessment & Plan Note (Signed)
Fair control.  We will see how he responds to a trial of beta blockers.  He is still off of lisinopril.

## 2011-04-02 ENCOUNTER — Ambulatory Visit (INDEPENDENT_AMBULATORY_CARE_PROVIDER_SITE_OTHER): Payer: Medicare Other | Admitting: Cardiology

## 2011-04-02 VITALS — BP 122/84 | HR 67 | Resp 18 | Ht 71.0 in | Wt 236.0 lb

## 2011-04-02 DIAGNOSIS — I719 Aortic aneurysm of unspecified site, without rupture: Secondary | ICD-10-CM

## 2011-04-02 DIAGNOSIS — R0602 Shortness of breath: Secondary | ICD-10-CM

## 2011-04-02 DIAGNOSIS — I4891 Unspecified atrial fibrillation: Secondary | ICD-10-CM

## 2011-04-02 DIAGNOSIS — I25810 Atherosclerosis of coronary artery bypass graft(s) without angina pectoris: Secondary | ICD-10-CM

## 2011-04-02 DIAGNOSIS — R0609 Other forms of dyspnea: Secondary | ICD-10-CM

## 2011-04-02 DIAGNOSIS — I2510 Atherosclerotic heart disease of native coronary artery without angina pectoris: Secondary | ICD-10-CM

## 2011-04-02 DIAGNOSIS — R0989 Other specified symptoms and signs involving the circulatory and respiratory systems: Secondary | ICD-10-CM

## 2011-04-02 DIAGNOSIS — R5383 Other fatigue: Secondary | ICD-10-CM

## 2011-04-02 DIAGNOSIS — E785 Hyperlipidemia, unspecified: Secondary | ICD-10-CM

## 2011-04-02 DIAGNOSIS — R002 Palpitations: Secondary | ICD-10-CM

## 2011-04-02 MED ORDER — METOPROLOL SUCCINATE ER 50 MG PO TB24: 50.0000 mg | ORAL_TABLET | Freq: Every day | ORAL | Status: DC

## 2011-04-02 NOTE — Patient Instructions (Signed)
Stop Atenolol.  Start Metoprolol  succinate 50mg  daily.  Your physician has requested that you have en exercise stress myoview. For further information please visit [email or web address]. Please follow instruction sheet, as given.   Schedule an appointment for fasting lab ---lipid profile/liver profile/CBC/TSH   Dr Shirlee Latch has referred you to Dr Craige Cotta in pulmonary.  Schedule an appointment to see Dr Shirlee Latch  in about 3 weeks.

## 2011-04-02 NOTE — Progress Notes (Signed)
PCP: Dr. Tiburcio Pea  This is a 70 yo with complex history including CAD s/p CABG, paroxysmal atrial fibrillation, PVCs, chronic dyspnea, and diastolic CHF who presents for cardiology followup. He was formerly seen by Dr. Eden Emms, and I am seeing him for the first time today. Earlier this year, he developed significant exertional dyspnea and palpitations.  Event monitor showed only PVCs.  He had a syncopal episode during the monitoring period, but no arrhythmias on telemetry during the event.  It was thought to be vasovagal (occured while he was sitting on the toilet).  No further syncope/presyncope.  At last appointment with Tereso Newcomer, he was given several beta blockers to try to see which one he could tolerate the best.  He has only been able to take a minimal dose of carvedilol.  Toprol XL seemed to cause the least side effects. Over the last couple of weeks, the palpitations have actually essentially resolved.  He is not as short of breath now.  Currently, no dyspnea walking on flat ground and mild shortness of breath with steps.  No recent chest pain or tightness.  He does continue to be fatigued, and his sleep is poor.  He uses his CPAP.   Patient had atrial fibrillation with RVR in 1/11 converted back to NSR spontaneously.  Patient was on Pradaxa for a while but stopped it.  He has had no documented atrial fibrillation recurrence.    Finally, patient had an echo done during workup for dyspnea and palpitations.  This showed EF 50% with inferior hypokinesis, moderate RV dilation and dysfunction, and ascending aorta aneurysm to 4.5  ECG: NSR, 1st degree AVB (PR 212), RBBB  Labs (2/12): K 4.2, creatinine 1.07  PMH: 1. CAD: s/p CABG in 2007.  NSTEMI 1/11 in setting of atrial fibrillation with RVR.  LHC at that time showed stable disease: EF 55%, SVG-RCA with 50% distal stenosis, .sequential SVG-OM and ramus with patent OM branch and occluded ramus branch, SVG-D patent, LIMA-LAD patent. 2. Diastolic CHF:  Echo (2/12) with EF 50% and inferior hypokinesis, ascending aorta dilated to 4.6 cm, moderate RV dilation and dysfunction.  3. Ascending aortic aneurysm: 4.6 cm on echo (2/12) 4. RLL nodule: stable over 3 years 5. OSA on CPAP 6. Chronic RBBB 7. Paroxysmal atrial fibrillation: Only noted in 1/11.  3 week event monitor in 2/12 with no atrial fibrillation.  8. HTN 9. Hyperlipidemia 10. GERD 11. BPH 12. PE in 2007 at time of CABG. 13. Syncopal episode in 2/12 (suspect vasovagal).  3-week even monitor showed PVCs, no atrial fibrillation.   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   FH: No premature CAD  ROS: All systems reviewed and negative except as noted in HPI.   Current Outpatient Prescriptions  Medication Sig Dispense Refill  . aspirin 325 MG EC tablet Take 325 mg by mouth daily.        . fish oil-omega-3 fatty acids 1000 MG capsule Take 2 g by mouth daily.        . Garlic 100 MG TABS Take by mouth daily.       . simvastatin (ZOCOR) 20 MG tablet 1 tablet Daily.      . Tamsulosin HCl (FLOMAX) 0.4 MG CAPS Take 0.4 mg by mouth daily.        . metoprolol (TOPROL XL) 50 MG 24 hr tablet Take 1 tablet (50 mg total) by mouth daily.  30 tablet  11    BP 122/84  Pulse 67  Resp 18  Ht 5\' 11"  (1.803 m)  Wt 236 lb (107.049 kg)  BMI 32.92 kg/m2 General: NAD, obese Neck: JVP 7 cm, no thyromegaly or thyroid nodule.  Lungs: Clear to auscultation bilaterally with normal respiratory effort. CV: Nondisplaced PMI.  Heart regular S1/S2, no S3/S4, no murmur. Trace ankle edema.  No carotid bruit.  Normal pedal pulses.  Abdomen: Soft, nontender, no hepatosplenomegaly, no distention.  Neurologic: Alert and oriented x 3.  Psych: Normal affect. Extremities: No clubbing or cyanosis.

## 2011-04-03 DIAGNOSIS — I719 Aortic aneurysm of unspecified site, without rupture: Secondary | ICD-10-CM | POA: Insufficient documentation

## 2011-04-03 DIAGNOSIS — R5383 Other fatigue: Secondary | ICD-10-CM | POA: Insufficient documentation

## 2011-04-03 NOTE — Assessment & Plan Note (Signed)
CAD s/p CABG.  Last echo in 2/12 showed EF 50% with inferior hypokinesis.  Prior echo had EF 55-60%.  Last cath showed moderate (50%) stenosis in SVG-RCA.  Given the inferior hypokinesis that was not commented upon on prior echoes, I am concerned that he could have had worsening of disease in his SVG-RCA.  However, he is symptomatically improved and has had no chest pain.   - Continue ASA, beta blocker, statin. - I will get an ETT-myoview to assess for ischemia.  If abnormal, plan left heart cath.

## 2011-04-03 NOTE — Assessment & Plan Note (Signed)
Check lipids with goal LDL < 70.   

## 2011-04-03 NOTE — Assessment & Plan Note (Signed)
Paroxysmal atrial fibrillation with only 1 known episode in 1/11.  No atrial fibrillation on recent 3-week event monitor.  I told him that it would be safest, given his risk factor profile, to be anticoagulated with Pradaxa, Xarelto, or coumadin.  He declines anticoagulation.  He will take ASA 325 mg daily.  If he has another atrial fibrillation episode, I will again strongly encourage anticoagulation.

## 2011-04-03 NOTE — Assessment & Plan Note (Signed)
Generalized, also poor sleep.  I will check TSH and CBC.   I will also refer him to see Dr. Craige Cotta again for sleep evaluation.  He feels like his CPAP is not working as well as in the past.

## 2011-04-03 NOTE — Assessment & Plan Note (Signed)
4.6 cm ascending aortic aneurysm on echo.  Evaluate full thoracic aorta with MR angiogram of the thoracic aorta.

## 2011-04-03 NOTE — Assessment & Plan Note (Signed)
Patient has had chronic exertional dyspnea that has been present x several years.  It was worse earlier this year, NYHA class III.  It now is back to baseline with resolution of palpiations, NYHA class II-III.  Echo showed EF 50% with moderate RV dysfunction.  Patient is not volume overloaded on exam.  As he is feeling better and does not seem volume elevated, I will not add a diuretic at this point.

## 2011-04-03 NOTE — Assessment & Plan Note (Signed)
Palpitations likely were due to PVCs.  These have actually resolved over the last few weeks.  He had a syncopal episode while on the toilet that sounds vasovagal.  He had his monitor on at the time and no arrhythmia was noted.  He will continue on Toprol XL 50 mg daily as this seems to be the easiest beta blocker for him to tolerate.

## 2011-04-04 ENCOUNTER — Telehealth: Payer: Self-pay | Admitting: *Deleted

## 2011-04-04 DIAGNOSIS — I719 Aortic aneurysm of unspecified site, without rupture: Secondary | ICD-10-CM

## 2011-04-04 NOTE — Telephone Encounter (Signed)
Anne: please fax this note to Dr. 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 I did not discuss this with him but his ascending aorta was significantly dilated on echo.    LMTCB

## 2011-04-04 NOTE — Telephone Encounter (Signed)
LMTCB

## 2011-04-04 NOTE — Telephone Encounter (Signed)
I talked with pt about scheduling MRA chest to evaluate aortic aneurysm. Aorta dilated on echo done 03/15/11. Dr Shirlee Latch ordered BMP to be done prior to MRA of chest.  Pt is scheduled for GXT myoview 04/10/11. I reviewed with Dr Shirlee Latch. Dr Shirlee Latch recommended changing GXT myoview to [employer]. I have discussed with pt and he agrees with all of Dr Alford Highland recommendations.

## 2011-04-05 ENCOUNTER — Telehealth: Payer: Self-pay | Admitting: Cardiology

## 2011-04-05 DIAGNOSIS — I719 Aortic aneurysm of unspecified site, without rupture: Secondary | ICD-10-CM

## 2011-04-05 NOTE — Telephone Encounter (Signed)
Spoke with patient's wife. He was not at home but she will let him know that he will be getting a phone call back to schedule an MRA of the chest and a blood test per Dr.McLean. Will send order to [facility].

## 2011-04-05 NOTE — Telephone Encounter (Signed)
Pt rtn call re doing blood work-and scheduling an mra-would like to go ahead and get this scheduled

## 2011-04-09 ENCOUNTER — Telehealth: Payer: Self-pay | Admitting: Cardiology

## 2011-04-09 ENCOUNTER — Encounter: Payer: Self-pay | Admitting: *Deleted

## 2011-04-09 NOTE — Progress Notes (Signed)
This encounter was created in error - please disregard.

## 2011-04-09 NOTE — Telephone Encounter (Signed)
Pt wants to cancel his appt on 4/26 for mra/ pt states he has a death in the family and will not be able to make his appt/

## 2011-04-10 ENCOUNTER — Other Ambulatory Visit: Payer: Medicare Other | Admitting: *Deleted

## 2011-04-10 ENCOUNTER — Other Ambulatory Visit (HOSPITAL_COMMUNITY): Payer: Medicare Other | Admitting: Radiology

## 2011-04-10 NOTE — Telephone Encounter (Signed)
I believe this should be forwarded to [facility]. Thanks. Thurston Hole

## 2011-04-12 ENCOUNTER — Other Ambulatory Visit (HOSPITAL_COMMUNITY): Payer: Medicare Other

## 2011-04-13 ENCOUNTER — Ambulatory Visit: Payer: Medicare Other | Admitting: Pulmonary Disease

## 2011-04-16 ENCOUNTER — Encounter: Payer: Self-pay | Admitting: Pulmonary Disease

## 2011-04-18 ENCOUNTER — Encounter: Payer: Self-pay | Admitting: Pulmonary Disease

## 2011-04-18 ENCOUNTER — Ambulatory Visit (INDEPENDENT_AMBULATORY_CARE_PROVIDER_SITE_OTHER): Payer: Medicare Other | Admitting: Pulmonary Disease

## 2011-04-18 DIAGNOSIS — J310 Chronic rhinitis: Secondary | ICD-10-CM

## 2011-04-18 DIAGNOSIS — G4733 Obstructive sleep apnea (adult) (pediatric): Secondary | ICD-10-CM

## 2011-04-18 DIAGNOSIS — R0602 Shortness of breath: Secondary | ICD-10-CM

## 2011-04-18 NOTE — Progress Notes (Signed)
Subjective:    Patient ID: [name], male    DOB: Mar 30, 1941, 70 y.o.   MRN: [id number] CC: Tonia Ghent HPI 70 yo male former smoker with sleep apnea using CPAP 12 cm.  He had to stop using CPAP for a while due to sinus problems.  His sleep was worse.  He has since resumed CPAP and doing better. He has dryness in his mouth.  He goes to bed at 1030 and wakes up at 7.  He wakes up once to use the bathroom.  He has a nasal mask.  Past Medical History  Diagnosis Date  . CAD (coronary artery disease)     s/p CABG 2007; NSTEMI in setting of AFib with RVR 12/2009; cath 1/11: S-RCA ok with prox 30-40% and 50% stenoses; S-OM and RI  with patent OM limb but an occluded RI limb; S-D2 and L-LAD ok; EF 55%  . HTN (hypertension)   . Diastolic dysfunction     Echo 4/09:  EF 55-60%; mild LVH; focal basal septal hypertrophy; grade 1 diast.dysfxn; mild BAE and RVE  . GERD (gastroesophageal reflux disease)   . Colon cancer   . BPH (benign prostatic hyperplasia)   . PE (pulmonary embolism) 2007    after CABG  . Lung granuloma     Right upper  . OSA on CPAP   . Dyspnea   . Atrial fibrillation     previously on Multaq  . RBBB (right bundle branch block)   . Hyperlipidemia      Family History  Problem Relation Age of Onset  . Colon cancer Sister   . Lung cancer Mother   . Breast cancer Sister   . Breast cancer Sister   . Emphysema Father   . Heart disease Father   . Heart disease Mother      History   Social History  . Marital Status: Married    Spouse Name: N/A    Number of Children: N/A  . Years of Education: N/A   Occupational History  . retired   . mayor of [place]    Social History Main Topics  . Smoking status: Former Smoker -- 1.0 packs/day for 45 years    Types: Cigarettes    Quit date: 12/18/1995  . Smokeless tobacco: Not on file  . Alcohol Use: No     quit in 1980  . Drug Use: No  . Sexually Active: Not on file   Other Topics Concern  . Not  on file   Social History Narrative  . No narrative on file     No Known Allergies   Outpatient Prescriptions Prior to Visit  Medication Sig Dispense Refill  . aspirin 325 MG EC tablet Take 325 mg by mouth daily.        . fish oil-omega-3 fatty acids 1000 MG capsule Take 2 g by mouth daily.        . Garlic 100 MG TABS Take by mouth daily.       . metoprolol (TOPROL XL) 50 MG 24 hr tablet Take 1 tablet (50 mg total) by mouth daily.  30 tablet  11  . simvastatin (ZOCOR) 20 MG tablet 1 tablet Daily.      . Tamsulosin HCl (FLOMAX) 0.4 MG CAPS Take 0.4 mg by mouth daily.            Review of Systems     Objective:   Physical Exam Filed Vitals:   04/18/11 1521  BP:  122/80  Pulse: 83  Temp: 98.4 F (36.9 C)  TempSrc: Oral  Height: 5\' 11"  (1.803 m)  Weight: 237 lb (107.502 kg)  SpO2: 93%   General - Obese, no distress HEENT - no sinus tenderness, no oral exudate, no LAN Cardiac - s1s2 regular, no murmur Chest - CTA Abd - soft, nontender Ext - no edema Neuro - normal strength, CN intact Psych - normal behavior/mood        Assessment & Plan:   RHINITIS Stable.  OBSTRUCTIVE SLEEP APNEA He has been doing well with CPAP.  Encouraged him to maintain his compliance.  Will arrange for chin strap to see if this helps with his mouth dryness.    Updated Medication List Outpatient Encounter Prescriptions as of 04/18/2011  Medication Sig Dispense Refill  . aspirin 325 MG EC tablet Take 325 mg by mouth daily.        . fish oil-omega-3 fatty acids 1000 MG capsule Take 2 g by mouth daily.        . Garlic 100 MG TABS Take by mouth daily.       . metoprolol (TOPROL XL) 50 MG 24 hr tablet Take 1 tablet (50 mg total) by mouth daily.  30 tablet  11  . simvastatin (ZOCOR) 20 MG tablet 1 tablet Daily.      . Tamsulosin HCl (FLOMAX) 0.4 MG CAPS Take 0.4 mg by mouth daily.

## 2011-04-18 NOTE — Assessment & Plan Note (Signed)
Stable

## 2011-04-18 NOTE — Patient Instructions (Signed)
Follow up in 6 months 

## 2011-04-18 NOTE — Assessment & Plan Note (Addendum)
He has been doing well with CPAP.  Encouraged him to maintain his compliance.  Will arrange for chin strap to see if this helps with his mouth dryness.

## 2011-04-24 ENCOUNTER — Ambulatory Visit (HOSPITAL_COMMUNITY): Payer: Medicare Other | Attending: Cardiology | Admitting: Radiology

## 2011-04-24 ENCOUNTER — Ambulatory Visit: Payer: Medicare Other | Admitting: Cardiology

## 2011-04-24 ENCOUNTER — Other Ambulatory Visit (INDEPENDENT_AMBULATORY_CARE_PROVIDER_SITE_OTHER): Payer: Medicare Other | Admitting: *Deleted

## 2011-04-24 DIAGNOSIS — R0609 Other forms of dyspnea: Secondary | ICD-10-CM

## 2011-04-24 DIAGNOSIS — I4891 Unspecified atrial fibrillation: Secondary | ICD-10-CM

## 2011-04-24 DIAGNOSIS — R0989 Other specified symptoms and signs involving the circulatory and respiratory systems: Secondary | ICD-10-CM

## 2011-04-24 DIAGNOSIS — I25810 Atherosclerosis of coronary artery bypass graft(s) without angina pectoris: Secondary | ICD-10-CM

## 2011-04-24 DIAGNOSIS — I4510 Unspecified right bundle-branch block: Secondary | ICD-10-CM

## 2011-04-24 DIAGNOSIS — I719 Aortic aneurysm of unspecified site, without rupture: Secondary | ICD-10-CM

## 2011-04-24 DIAGNOSIS — I2510 Atherosclerotic heart disease of native coronary artery without angina pectoris: Secondary | ICD-10-CM

## 2011-04-24 LAB — LIPID PANEL
Cholesterol: 129 mg/dL (ref 0–200)
Triglycerides: 84 mg/dL (ref 0.0–149.0)

## 2011-04-24 LAB — HEPATIC FUNCTION PANEL
AST: 20 U/L (ref 0–37)
Albumin: 3.9 g/dL (ref 3.5–5.2)
Alkaline Phosphatase: 43 U/L (ref 39–117)
Total Protein: 7.1 g/dL (ref 6.0–8.3)

## 2011-04-24 LAB — CBC WITH DIFFERENTIAL/PLATELET
Basophils Absolute: 0 10*3/uL (ref 0.0–0.1)
Eosinophils Absolute: 0.1 10*3/uL (ref 0.0–0.7)
Hemoglobin: 14.9 g/dL (ref 13.0–17.0)
Lymphocytes Relative: 34.1 % (ref 12.0–46.0)
MCHC: 33.3 g/dL (ref 30.0–36.0)
Monocytes Absolute: 0.5 10*3/uL (ref 0.1–1.0)
Neutro Abs: 2.6 10*3/uL (ref 1.4–7.7)
RDW: 15.3 % — ABNORMAL HIGH (ref 11.5–14.6)

## 2011-04-24 LAB — BASIC METABOLIC PANEL
Calcium: 9.3 mg/dL (ref 8.4–10.5)
Chloride: 107 mEq/L (ref 96–112)
Creatinine, Ser: 1 mg/dL (ref 0.4–1.5)
GFR: 79.27 mL/min (ref >60.00)

## 2011-04-24 MED ORDER — TECHNETIUM TC 99M TETROFOSMIN IV KIT
11.0000 | PACK | Freq: Once | INTRAVENOUS | Status: AC | PRN
  Administered 2011-04-24: 11 via INTRAVENOUS

## 2011-04-24 MED ORDER — TECHNETIUM TC 99M TETROFOSMIN IV KIT
32.4000 | PACK | Freq: Once | INTRAVENOUS | Status: AC | PRN
  Administered 2011-04-24: 32 via INTRAVENOUS

## 2011-04-24 MED ORDER — REGADENOSON 0.4 MG/5ML IV SOLN
0.4000 mg | Freq: Once | INTRAVENOUS | Status: AC
  Administered 2011-04-24: 0.4 mg via INTRAVENOUS

## 2011-04-24 NOTE — Progress Notes (Addendum)
[facility] SITE 3 NUCLEAR MED [address] [place] [address] [phone]  Cardiology Nuclear Med Study  [name] is a 70 y.o. male [id number] 1941/11/05   Nuclear Med Background Indication for Stress Test:  Evaluation for Ischemia and Graft Patency History: 02/14/06 MPS: ? Mild anterior ischemia, EF=48%>03/15/06 CABG x 5; 01/11 NSTEMI> Cath: Patent grafts except 50% SVG> RCA;  2/12 Echo: EF=50%; Hx.of AFIB,Bradycardia, AAA 4.6 cm Cardiac Risk Factors: History of Smoking, Hypertension, Lipids and RBBB  Symptoms:  DOE, Fatigue, Fatigue with Exertion, Nausea, Palpitations, SOB and Diaphoresis>Syncope 2/12 vaso-vagal    Nuclear Pre-Procedure Caffeine/Decaff Intake:  None NPO After: 600pm   Lungs:  Clear IV 0.9% NS with Angio Cath:  20g  IV Site: R Hand  IV Started by:  Cathlyn Parsons, RN  Chest Size (in):  48 Cup Size: n/a  Height: 5\' 11"  (1.803 m)  Weight:  231 lb (104.781 kg)  BMI:  Body mass index is 32.22 kg/(m^2). Tech Comments:  Metoprolol held x  24hrs    Nuclear Med Study 1 or 2 day study: 1 day  Stress Test Type:  Lexiscan  Reading MD: Arvilla Meres, MD  Order Authorizing Provider:  Dr. Marca Ancona, MD  Resting Radionuclide: Technetium 47m Tetrofosmin  Resting Radionuclide Dose: 11.0 mCi   Stress Radionuclide:  Technetium 86m Tetrofosmin  Stress Radionuclide Dose: 32.4 mCi           Stress Protocol Rest HR: 72 Stress HR: 95  Rest BP: 139/92 Stress BP: 155/95  Exercise Time (min): n/a METS: n/a   Predicted Max HR: 150 bpm % Max HR: 63.33 bpm Rate Pressure Product: 46962   Dose of Adenosine (mg):  n/a Dose of Lexiscan: 0.4 mg  Dose of Atropine (mg): n/a Dose of Dobutamine: n/a mcg/kg/min (at max HR)  Stress Test Technologist: Irean Hong, RN  Nuclear Technologist:  Doyne Keel, CNMT     Rest Procedure:  Myocardial perfusion imaging was performed at rest 45 minutes following the intravenous administration of Technetium  66m Tetrofosmin. Rest ECG: NSR-RBBB  Stress Procedure:  The patient received IV Lexiscan 0.4 mg over 15-seconds.  Technetium 78m Tetrofosmin injected at 30-seconds.  There were no significant changes with Lexiscan. The patient had a brief change in rhythm to ectopic atrial rhythm after lexiscan.  Quantitative spect images were obtained after a 45 minute delay. Stress ECG: No significant change from baseline ECG  QPS Raw Data Images:  Normal; no motion artifact; normal heart/lung ratio. Stress Images:  Normal homogeneous uptake in all areas of the myocardium. Rest Images:  Normal homogeneous uptake in all areas of the myocardium. Subtraction (SDS):  Normal Transient Ischemic Dilatation (Normal <1.22):  1.03 Lung/Heart Ratio (Normal <0.45):  0.31  Quantitative Gated Spect Images QGS EDV:  117 ml QGS ESV:  53 ml QGS cine images:  NL LV Function; NL Wall Motion QGS EF: 55%  Impression Exercise Capacity:  Lexiscan with no exercise. BP Response:  n/a Clinical Symptoms:  n/a ECG Impression:  No significant ST segment change suggestive of ischemia. Comparison with Prior Nuclear Study: No significant change from previous study  Overall Impression:  Normal stress nuclear study.          Normal study [employer]

## 2011-04-25 NOTE — Progress Notes (Signed)
COPY ROUTED DR. Shirlee Latch.Mirna Mires

## 2011-04-26 ENCOUNTER — Inpatient Hospital Stay (HOSPITAL_COMMUNITY): Admission: RE | Admit: 2011-04-26 | Payer: Medicare Other | Source: Ambulatory Visit

## 2011-04-26 ENCOUNTER — Ambulatory Visit (HOSPITAL_COMMUNITY)
Admission: RE | Admit: 2011-04-26 | Discharge: 2011-04-26 | Disposition: A | Discharge status: Home / Self Care | Payer: Medicare Other | Source: Ambulatory Visit | Attending: Cardiology | Admitting: Cardiology

## 2011-04-26 DIAGNOSIS — I719 Aortic aneurysm of unspecified site, without rupture: Secondary | ICD-10-CM

## 2011-04-26 MED ORDER — GADOBENATE DIMEGLUMINE 529 MG/ML IV SOLN: 20.0000 mL | Freq: Once | INTRAVENOUS | Status: DC

## 2011-04-27 ENCOUNTER — Ambulatory Visit (INDEPENDENT_AMBULATORY_CARE_PROVIDER_SITE_OTHER): Payer: Medicare Other | Admitting: Cardiology

## 2011-04-27 ENCOUNTER — Encounter: Payer: Self-pay | Admitting: Cardiology

## 2011-04-27 DIAGNOSIS — I2510 Atherosclerotic heart disease of native coronary artery without angina pectoris: Secondary | ICD-10-CM

## 2011-04-27 DIAGNOSIS — I719 Aortic aneurysm of unspecified site, without rupture: Secondary | ICD-10-CM

## 2011-04-27 DIAGNOSIS — E7800 Pure hypercholesterolemia, unspecified: Secondary | ICD-10-CM

## 2011-04-27 DIAGNOSIS — I25810 Atherosclerosis of coronary artery bypass graft(s) without angina pectoris: Secondary | ICD-10-CM

## 2011-04-27 DIAGNOSIS — I4891 Unspecified atrial fibrillation: Secondary | ICD-10-CM

## 2011-04-27 MED ORDER — LISINOPRIL 5 MG PO TABS: 5.0000 mg | ORAL_TABLET | Freq: Every day | ORAL | Status: DC

## 2011-04-27 NOTE — Patient Instructions (Addendum)
   Your physician has recommended you make the following change in your medication: start Lisinopril 5 mg daily  Your physician recommends that you return for lab work in: BMP  In 2 weeks--414.05   Your physician wants you to follow-up in: 6 months with Dr Shirlee Latch.(October 2012).You will receive a reminder letter in the mail two months in advance. If you don't receive a letter, please call our office to schedule the follow-up appointment.

## 2011-04-27 NOTE — Progress Notes (Signed)
appt with Dr Shirlee Latch 04/27/11

## 2011-04-28 NOTE — Assessment & Plan Note (Signed)
Paroxysmal atrial fibrillation with only 1 known episode in 1/11.  No atrial fibrillation on recent 3-week event monitor.  I told him that it would be safest, given his risk factor profile, to be anticoagulated with Pradaxa, Xarelto, or coumadin.  He declines anticoagulation.  He will take ASA 325 mg daily.  If he has another atrial fibrillation episode, I will again strongly encourage anticoagulation.  

## 2011-04-28 NOTE — Progress Notes (Signed)
PCP: Dr. Tiburcio Pea   This is a 70 yo with complex history including CAD s/p CABG, paroxysmal atrial fibrillation, PVCs, chronic dyspnea, and diastolic CHF who presents for cardiology followup.  Earlier this year, he developed significant exertional dyspnea and palpitations. Event monitor showed only PVCs. He had a syncopal episode during the monitoring period, but no arrhythmias on telemetry during the event. It was thought to be vasovagal (occurred while he was sitting on the toilet). No further syncope/presyncope.  He has had a hard time tolerating beta blockers, but Toprol XL seems to cause the least side effects. Over the last few weeks, the palpitations have actually essentially resolved. He is not as short of breath now. Currently, no dyspnea walking on flat ground and mild shortness of breath with steps. No recent chest pain or tightness. Given the exertional dyspnea earlier this year, he had a Lexiscan myoview that showed EF 55% and no ischemia or infarction.  Patient had atrial fibrillation with RVR in 1/11 converted back to NSR spontaneously. Patient was on Pradaxa for a while but stopped it. He has had no documented atrial fibrillation recurrence.   Finally, patient had an echo done during workup for dyspnea and palpitations. This showed EF 50% with inferior hypokinesis, moderate RV dilation and dysfunction, and dilated aortic root to 4.5 cm.  I had him do a chest MR angiogram to further evaluate the dilated ascending aorta.  This confirmed the dilated aortic root to 4.5 cm.  The ascending aorta measured 4.1 cm and the remainder of the thoracic aorta was normal in caliber.   Labs (2/12): K 4.2, creatinine 1.07  Labs (5/12): HCT 44.6, TSH normal, LDL 78, HDL 34, K 4.2, creatinine 1.0  PMH:  1. CAD: s/p CABG in 2007. NSTEMI 1/11 in setting of atrial fibrillation with RVR. LHC at that time showed stable disease: EF 55%, SVG-RCA with 50% distal stenosis, .sequential SVG-OM and ramus with patent OM  branch and occluded ramus branch, SVG-D patent, LIMA-LAD patent.  Lexiscan myoview (5/12) with EF 55%, no ischemia or infarction.  2. Diastolic CHF: Echo (2/12) with EF 50% and inferior hypokinesis, ascending aorta dilated to 4.6 cm, moderate RV dilation and dysfunction.  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  4. RLL nodule: stable over 3 years  5. OSA on CPAP  6. Chronic RBBB  7. Paroxysmal atrial fibrillation: Only noted in 1/11. 3 week event monitor in 2/12 with no atrial fibrillation.  8. HTN  9. Hyperlipidemia  10. GERD  11. BPH  12. PE in 2007 at time of CABG.  13. Syncopal episode in 2/12 (suspect vasovagal). 3-week even monitor showed PVCs, no atrial fibrillation.   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   FH: No premature CAD   ROS: All systems reviewed and negative except as noted in HPI.   Current Outpatient Prescriptions  Medication Sig Dispense Refill  . aspirin 325 MG EC tablet Take 325 mg by mouth daily.        . fish oil-omega-3 fatty acids 1000 MG capsule Take 2 g by mouth daily.        . Garlic 100 MG TABS Take by mouth daily.       . metoprolol (TOPROL XL) 50 MG 24 hr tablet Take 1 tablet (50 mg total) by mouth daily.  30 tablet  11  . Multiple Vitamin (MULTIVITAMIN) tablet Take  1 tablet by mouth daily.        . simvastatin (ZOCOR) 20 MG tablet 1 tablet Daily.      . Tamsulosin HCl (FLOMAX) 0.4 MG CAPS Take 0.4 mg by mouth daily.        Marland Kitchen lisinopril (PRINIVIL,ZESTRIL) 5 MG tablet Take 1 tablet (5 mg total) by mouth daily.  30 tablet  6    BP 140/82  Pulse 86  Ht 5\' 11"  (1.803 m)  Wt 230 lb (104.327 kg)  BMI 32.08 kg/m2 General: NAD, obese  Neck: JVP 7 cm, no thyromegaly or thyroid nodule.  Lungs: Clear to auscultation bilaterally with normal respiratory effort.  CV: Nondisplaced PMI. Heart regular S1/S2, no S3/S4, no murmur.  Trace ankle edema. No carotid bruit. Normal pedal pulses.  Abdomen: Soft, nontender, no hepatosplenomegaly, no distention.  Neurologic: Alert and oriented x 3.  Psych: Normal affect.  Extremities: No clubbing or cyanosis.

## 2011-04-28 NOTE — Assessment & Plan Note (Signed)
Aortic root dilated to 4.5 cm.  Ascending aorta mildly dilated.  Repeat MRA chest to follow in 5/13.  Need to control BP well to prevent worsening: patient is on beta blocker, will add lisinopril 5 mg daily with BMET in 2 wks.

## 2011-04-28 NOTE — Assessment & Plan Note (Signed)
CAD s/p CABG.  Last echo in 2/12 showed EF 50% with inferior hypokinesis.  Prior echo had EF 55-60%.  Last cath showed moderate (50%) stenosis in SVG-RCA.  Given the inferior hypokinesis that was not commented upon on prior echoes and his exertional dyspnea earlier this year, I had him get a [employer].  This showed no evidence for ischemia or infarction.  Continue ASA, Toprol XL, simvastatin.  Will add lisinopril 5 mg daily.

## 2011-04-28 NOTE — Assessment & Plan Note (Signed)
LDL is close to goal (<70).  Continue current statin dose.

## 2011-05-01 NOTE — Assessment & Plan Note (Signed)
[facility]                       [place] CARDIOLOGY OFFICE NOTE   NAME:[name], [name]                      MRN:          [id number]  DATE:03/18/2008                            DOB:          04/25/1941    [name] returns today for followup.  He is doing well.  He has not had  any significant chest pain.  He is status post coronary bypass surgery.  Risk factors are well-modified.  When I last saw him, we started him  back on beta blockers for relative increase in blood pressure.   He has been active.  He is working at [employer].   REVIEW OF SYSTEMS:  Remarkable for an abscessed right upper tooth.  He  has been on penicillin and is to have it pulled.  He does not need SBE  prophylaxis and has been afebrile.  His review of systems is otherwise  negative.   MEDICATIONS:  1. Zocor 20 daily.  2. Aspirin daily.  3. Carvedilol 6.125 b.i.d.  4. Omega fish oil.  5. Penicillin.  6. Currently Flomax 0.4 daily.   EXAM:  Remarkable for a healthy-appearing elderly white male in no  distress.  Affect is appropriate.  Weight is 237 up 5 pounds, blood pressure is 102/78, pulse 78 and  regular, respiratory rate 14, afebrile.  HEENT:  Unremarkable.  Carotids are without bruit.  No lymphadenopathy, thyromegaly, JVP  elevation.  LUNGS:  Clear, good diaphragmatic motion.  No wheezing, S1-S2, normal  heart sounds.  PMI normal.  ABDOMEN:  Benign, benign bowel sounds positive, no AAA, no tenderness,  no hepatosplenomegaly, no hepatojugular reflux.  Distal pulses are intact.  No edema.  NEURO:  Nonfocal.  SKIN:  Warm and dry.  No muscular weakness.  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  He has some  gingival disease.  His tooth needs to be pulled.   IMPRESSION:  1. Coronary disease, previous CABG, continue aspirin and beta-blocker.      No angina.  2. Hypercholesterolemia.  Continue Zocor 20 a day.  Lipid and liver      profile in  6 months.  3. Hypertension currently better controlled.  Continue current dose of      beta-blocker.  4. Prostatism.  Good urinary flow.  Continue Flomax 0.4 day.  This      also helps his blood pressure.  5. Right tooth abscess to be pulled next week.  No need for subacute      bacterial endocarditis prophylaxis.  6. History of right middle lobe lung nodule, 4-7 mm.  Followup      noncontrast CT in May.   Overall, I think [name]'s heart is doing well, and I will see him back  in 6 months.     Noralyn Pick. Eden Emms, MD, [facility]  Electronically Signed    PCN/MedQ  DD: 03/18/2008  DT: 03/18/2008  Job #: [id number]

## 2011-05-01 NOTE — Op Note (Signed)
[name], FREEL NO.:  [id number]   MEDICAL RECORD NO.:  [id number]          PATIENT TYPE:  INP   LOCATION:  5158                         FACILITY:  [facility]   PHYSICIAN:  Ollen Gross. Vernell Morgans, M.D. DATE OF BIRTH:  07-Feb-1941   DATE OF PROCEDURE:  08/10/2008  DATE OF DISCHARGE:                               OPERATIVE REPORT   PREOPERATIVE DIAGNOSIS:  Ventral hernia.   POSTOPERATIVE DIAGNOSIS:  Ventral hernia.   PROCEDURE:  Ventral hernia repair with mesh.   SURGEON:  Ollen Gross. Vernell Morgans, MD   ASSISTANT:  Almond Lint, MD   ANESTHESIA:  General endotracheal.   PROCEDURE:  After informed consent was obtained, the patient was brought  to the operating room and placed in a supine position on the operating  room table.  After adequate induction of general anesthesia, the  patient's abdomen was prepped with Betadine and draped in usual sterile  manner.  A lower midline incision was made with a 10 blade knife through  his old incision.  This incision was carried down through the skin and  subcutaneous tissue sharply with the electrocautery until the midline  fascia was encountered.  The patient had a previous hernia repair in  this area and he had several permanent Prolene type stitches that were  removed as well as some mesh.  This was also divided sharply with the  electrocautery.  At this point, we were in the preperitoneal space.  This was the area where the hernia defect was encountered just to the  left of the midline in the preperitoneal space and the hernia defect was  containing bladder.  We were able to find the peritoneum and opened it  sharply with Metzenbaum scissors.  There were some small bowel adhesions  to the peritoneum in this area which were taken down sharply with  Metzenbaum scissors and Bovie electrocautery.  This allowed [place] to better  identify where the hernia defect was.  It was involving the dome of the  bladder.  We decided at this point  because of the apposition of the  bladder to repair this in the preperitoneal space, we were able to  dissect the bladder out of the hernia defect by gentle blunt finger  dissection and some blunt hemostat dissection.  Once this was  accomplished, we were able to identify the actual defect.  The  preperitoneal space just below the abdominal wall muscles was dissected  circumferentially around the incision.  The hernia defect itself was  closed with two figure-of-eight #1 Novafil stitches.  The peritoneum at  this point was also closed with a running 2-0 Vicryl stitch.  A 10 x 15  piece of Proceed mesh was chosen.  It was oriented with the blue side  toward the ceiling.  The mesh was then placed in the preperitoneal space  just deep to the muscle and was anchored with #1 Novafil stitches that  came through the abdominal wall fascia and muscle through the mesh and  then back through the abdominal wall muscle and fascia.  The stitches  were placed circumferentially  around the defect several centimeters back  from the actual midline incision.  Once this was accomplished, the mesh  was in good position and well anchored under the abdominal wall muscles  but was in good apposition to the abdominal wall.  There was no tension  to the repair.  The wound was irrigated with copious amounts of saline.  The midline fascia was then reapproximated with interrupted #1 Novafil  figure-of-eight stitches.  The subcutaneous tissue was irrigated with  copious amounts of saline.  A small stab incision was made in the right  lower quadrant.  A hemostat was placed through this incision into the  subcutaneous tissue of the wound.  A 19-French round Harrison Mons drain was  brought through this opening and laid along the fascia of the abdominal  wall.  The drain was anchored to the skin with 3-0 nylon stitch.  The  subcutaneous tissue was reapproximated along the midline with a running  2-0 Vicryl  stitch and the skin  was closed with staples.  Betadine ointment and  sterile dressings were applied.  The patient tolerated the procedure  well.  At the end of the case, all needle, sponge and instrument counts  were correct.  The patient was then awakened and taken to recovery room  in stable condition.      Ollen Gross. Vernell Morgans, M.D.  Electronically Signed     PST/MEDQ  D:  08/10/2008  T:  08/10/2008  Job:  [id number]

## 2011-05-01 NOTE — Assessment & Plan Note (Signed)
[facility]                            CARDIOLOGY OFFICE NOTE   NAME:[name], [name]                      MRN:          [id number]  DATE:04/28/2007                            DOB:          05/22/1941    [name] returns today for followup.  He is status post CABG, complicated  by postoperative EP and PE.  He had multiple questions today.  Unfortunately he has not been compliant with his carvedilol.  He felt  that he could take it once a day however he has some relative diastolic  hypertension and tachycardia.  I explained to him that unless he wanted  to take a more expensive nongeneric brand that the carvedilol was a  b.i.d. drug.   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   The patient has had a small right middle lobe nodule.  He had recent CT  scan.  I reviewed this with him including the images.  It was read as a  stable a right middle lobe nodule with no active cardiopulmonary  disease.  I suspect this will not need followup for another 2 years.  The scan was done on Apr 28, 2007, he seemed relieved about this.   REVIEW OF SYSTEMS:  Remarkable for some atypical muscular chest pain, it  is positional, it is not necessarily exertional.  It does not radiate.  There is no associated diaphoresis or shortness of breath.  The pains  are intermittent, maybe once or twice a month.  They are not  progressive.   He also has some chronic exertional dyspnea.  He has gained about 15  pounds.  In light of his previous DVT I told him we would probably check  a repeat lower extremity duplex to make sure there has not been any  recurrent venous disease.  He also complains of lower extremity edema  which is likely secondary to previous DVT.   His review of systems otherwise negative.   He is on:  1. Zocor 29 a day.  2. Aspirin a day.  3. Carvedilol 6.125 b.i.d.  4. Flomax.   EXAMINATION:  Remarkable for an elderly appearing male  in no distress,  mood is appropriate.  Weight is up to 230, blood pressure is 129/85,  pulse is 76 and regular.  HEENT:  Normal, respiratory rate is 14.  He is afebrile.  NECK:  Supple, there is no carotid bruits, no thyromegaly, and there is  JVP elevation.  LUNGS:  Clear with no wheezing and normal diaphragmatic motion.  There is an S1-S2 with normal heart sounds, PMI is normal.  Bowel sounds  are positive, there is no tenderness, no AAA, no hepatosplenomegaly, no  hepatojugular reflux.  Femorals are +3 bilaterally. He has trace edema,  right greater than left.  There is no cords, no evidence of recurrent  DVT.  Distal pulses are +2 PTs bilaterally, and there is no varicosities  or lymphedema.  NEURO:  Nonfocal.  There is no muscular weakness.   IMPRESSION:  Stable status post coronary artery bypass grafting without  recurrent chest  pain.  Continue aspirin and statin therapy for stroke  prophylaxis and risk factor modification.  The patient will have a  followup CT scan probably in 2 years to followup the stable right middle  lobe nodule.  I took the liberty of canceling his followup appointment  with Dr. Laneta Simmers which was not needed.   He will have a followup duplex to assess the chronicity of his  previously known deep venous thrombosis.  Currently his lower extremity  edema is not bad enough to require diuretics.   I will see the patient back in 6 months so long as he does not have any  progression of his deep venous thrombosis.  His baseline EKG was also  reviewed today and showed a chronic right bundle branch block without  acute changes.     Noralyn Pick. Eden Emms, MD, [facility]     PCN/MedQ  DD: 04/28/2007  DT: 04/28/2007  Job #: [id number]

## 2011-05-01 NOTE — Assessment & Plan Note (Signed)
[facility]                            CARDIOLOGY OFFICE NOTE   NAME:Cecilio, CALE BETHARD                      MRN:          [id number]  DATE:10/07/2008                            DOB:          [birth year]    HISTORY OF PRESENT ILLNESS:  Ms. Schellenberg returns today for followup.  He  has had previous CABG and when I last saw my, I cleared him for a  ventral hernia surgery.  He had this successfully done.  He also had an  abscessed tooth, which was pulled, he is doing well.  He is not having  chest pain, PND, orthopnea, no palpitations.   He has been compliant with his meds.  I talked to Molly Maduro at length about  his diet.  He needs to lose about 10 pounds, particularly since he just  had his abdomen repaired with mesh.   He tells me that his total cholesterol is 150.   He is otherwise doing well.  His last Myoview study was in March 2007  and was nonischemic with an EF of 48%.   He had an echocardiogram done in April 2007, which showed an EF of 60%.  However, and he has had no signs of heart failure or decreased LV  function.  He has a history of a lung nodule in the right middle lobe,  which needs followup next May.  He is not smoking.   CURRENT MEDICATIONS:  1. Aspirin a day.  2. Carvedilol 6.125 b.i.d.  3. Omega acids.  4. Flomax.  5. Prilosec.  6. Aspirin.   PHYSICAL EXAMINATION:  GENERAL:  Remarkable for an elderly white male,  in no distress.  VITAL SIGNS:  His weight is 231, blood pressure 130/80, pulse 75 and  regular, respiratory rate 14, and afebrile.  HEENT:  Unremarkable.  NECK:  Carotids are normal without bruit.  No lymphadenopathy, no  thyromegaly, no JVP elevation.  LUNGS:  Clear.  Good diaphragmatic motion.  No wheezing.  S1 and S2.  Normal heart sounds, status post sternotomy.  PMI normal.  ABDOMEN:  Benign.  Bowel sounds positive status post midline incision  for hernia repair.  No AAA, no tenderness, no bruit, no  hepatosplenomegaly, no hepatojugular reflux, no tenderness.  EXTREMITIES:  Distal pulses are intact.  NEURO:  Nonfocal.  SKIN:  Warm and dry.  MUSCULOSKELETAL:  No muscular weakness.   IMPRESSION:  1. Stable coronary artery disease, previous coronary artery bypass      graft.  Continue aspirin and beta-blocker.  2. Hypercholesterolemia.  Apparently at target per primary care MD, no      myalgias.  Continue Zocor.  3. Central obesity.  Decrease carbohydrate intake.  Particularly      biscuits in the morning.  Increase activity level.  4. Prostatism.  Continue Flomax.  Urinary stream good.  PSA in 6      months.  5. Right bundle-branch block, chronic on EKG.   We also talked a little bit about Sasha's son apparently he has had WPW  and had a complicated ablation 15 years ago.  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  He has seen  Dr. Graciela Husbands in the past.  Unfortunately, he is on disability and quite  disabled from these complications of previous cardiac issues.  I told  him let me know if he wanted me to review the case and I would be happy  to assist him in any type of reevaluation.     Noralyn Pick. Eden Emms, MD, [facility]  Electronically Signed    PCN/MedQ  DD: 10/07/2008  DT: 10/08/2008  Job #: [id number]

## 2011-05-01 NOTE — Assessment & Plan Note (Signed)
[facility]                       [place] CARDIOLOGY OFFICE NOTE   NAME:Kitamura, JEFFORY SNELGROVE                      MRN:          [id number]  DATE:10/24/2007                            DOB:          06-29-1941    [name] returns today for followup.  He is status post recent coronary  artery bypass surgery complicated by perioperative DVT and PE.  He has  been doing fairly well.  Unfortunately, he has been somewhat  noncompliant with his Flomax and carvedilol.  His blood pressure has  been running a little bit high.  Benaiah has been under a little bit of  stress lately.  His mother-in-law who is 67 years old and lives with  them is becoming increasingly difficult to take care of.  He was in the  ER with her recently for a skin abrasion.   REVIEW OF SYSTEMS:  He has not had any significant chest pain, PND,  orthopnea.  He has mild chronic lower extremity edema in the right  extremity which is stable.  Review of systems otherwise negative.  In  particular he has not been having any angina and no shortness of breath.  We did do a venous duplex of his lower extremities in May and there was  no residual DVT.  He is now off Coumadin.   CURRENT MEDICATIONS:  1. Zocor 20 a day.  2. An aspirin a day.  3. He is supposed to be on carvedilol 6.125 b.i.d. and he has not been      taking it.  4. He is also supposed to be on Flomax 0.4 a day and he has not been      taking it.  5. He also takes baby aspirin.  6. Omega fish oil.   PHYSICAL EXAMINATION:  GENERAL:  Remarkable for a healthy-appearing,  middle-aged, white male in no distress.  VITAL SIGNS:  Blood pressure 140/90, pulse 86 and regular, afebrile,  weight 232, respiratory rate 14.  HEENT:  Unremarkable.  NECK:  Carotids normal without bruit.  No lymphadenopathy or  thyromegaly.  No JVP elevation.  No bruits.  LUNGS:  Clear with good diaphragmatic motion.  No wheezing.  HEART:  S1 S2 with normal heart  sounds.  PMI normal.  Sternotomy is well  healed.  ABDOMEN:  Benign.  Bowel sounds positive.  No hepatosplenomegaly  hepatojugular reflux.  No AAA.  No tenderness.  No bruit.  EXTREMITIES:  Distal pulses are intact.  No edema.  NEUROLOGIC:  Nonfocal.  SKIN:  Warm and dry.  No muscular weakness.   IMPRESSION:  1. Status post coronary artery bypass graft without residual angina.      Continue aspirin and beta-blocker.  2. Hypertension, borderline controlled.  To resume carvedilol twice a      day as well as his Flomax which will help, low salt diet.  Follow      up in 3 months.  3. History of deep vein thrombosis perioperative.  Duplex in May      without residual clot.  Continue aspirin a day.  4. Prostatism.  Continue Flomax 0.4 mg a  day.  Follow up with urology      to consider Avodart if indicated.   Overall, I think Maria is doing well so long as he is more compliant  with his carvedilol and Flomax, I think his blood pressure will be in a  reasonable range.  I will see him back in 3 months to reassess.     Noralyn Pick. Eden Emms, MD, [facility]  Electronically Signed    PCN/MedQ  DD: 10/24/2007  DT: 10/24/2007  Job #: [id number]

## 2011-05-01 NOTE — Op Note (Signed)
[name], [name]               ACCOUNT NO.:  [id number]   MEDICAL RECORD NO.:  [id number]          PATIENT TYPE:  AMB   LOCATION:  SDS                          FACILITY:  [facility]   PHYSICIAN:  Ollen Gross. Vernell Morgans, M.D. DATE OF BIRTH:  [birth year]   DATE OF PROCEDURE:  06/14/2008  DATE OF DISCHARGE:  06/14/2008                               OPERATIVE REPORT   PREOPERATIVE DIAGNOSIS:  Left inguinal hernia.   POSTOPERATIVE DIAGNOSES:  Left inguinal hernia and ventral hernia.   PROCEDURE:  Left inguinal hernia repair with mesh.   SURGEON:  Ollen Gross. Vernell Morgans, MD   ANESTHESIA:  General endotracheal.   PROCEDURE:  After informed consent was obtained, the patient was brought  to the operating room, placed in supine position on the operating room  table.  After induction of general anesthesia,  the patient's abdomen  and left groin were prepped with Betadine and draped in usual sterile  manner.  The left groin was then infiltrated with 0.25% Marcaine.  A  small incision was made from the edge of the pubic tubercle on the left  towards the anterior cephalic spine.  This incision was carried down  through the skin and subcutaneous tissue sharply with the electrocautery  until the fascia of the external oblique was encountered.  A small  bridging vein was clamped with hemostats, divided, and ligated with 3-0  silk ties.  The patient had had a previous ventral hernia repair with  mesh by Dr. Samuella Cota and clinically was felt to have a left inguinal  hernia.  The external oblique was opened along its fibers towards the  apex to the external ring with a 15 blade knife and Metzenbaum scissors.  The patient had a lot of scar tissue medial to this from his old  abdominal operations in opening the subcutaneous fat.  We did encounter  some bulging and we had a very similar appearance to a ventral hernia on  further palpation medial to the area of dissection and the inguinal  canal could fill the defect in  the midline fascia.  The cord structures  were surrounded bluntly between 2 fingers.  Then a 1/2 inch Penrose  drain was placed around the cord structures for retraction purposes.  The cord structures were gently skeletonized and there was no obvious  indirect hernia sac with the cord.  The patient did have some weakness  in and a slight bulging of the floor of the inguinal canal.  At this  point, the patient had definitely some weakness of the floor of the  inguinal canal.  I do not believe that, that was the hernia that could  be palpated on physical exam preoperatively.  I believe probably the  hernia that we could palpate was probably from his ventral hernia moving  into the left groin area.  Because of the floor of the canal was weak  and bulging, we did repaired with a piece of 3 x 6 UltraPro mesh that  was cut to fit.  The mesh was sewed inferiorly to the shelving edge of  the inguinal  ligament with a running 2-0 Prolene stitch.  Tails were cut  in the mesh and these tails were wrapped around the cord structures  superiorly.  The mesh was sewed to the muscular transverse abdominis and  the transversalis with interrupted 2-0 Prolene vertical mattress  stitches and lateral to the cord, the tails of mesh were anchored to the  shelving edge of inguinal ligament with interrupted 2-0 Prolene stitch.  At this point, the left groin area hernia repair looked good without any  tension.  The area was then irrigated with copious amounts of saline.  It was decided at this point not to fix the ventral hernia since he was  not consented for this, nor had we discussed it and it was a much larger  operation he was anticipating.  The left inguinal region was then  infiltrated with more 0.25% Marcaine.  The subcutaneous fascial layer  was then closed with a running 2-0 Vicryl stitch.  The external oblique  was closed with a running 2-0 Vicryl stitch.  The subcutaneous fascia  was then closed with a  running 3-0 Vicryl stitch and the skin was  closed with running 4-0 Monocryl subcuticular stitch and Dermabond  dressing was applied.  The patient tolerated the procedure well and at  the end of the case, all needle, sponge, and instrument counts were  correct.  The patient was then awakened and taken to the recovery room  in stable condition.      Ollen Gross. Vernell Morgans, M.D.  Electronically Signed     PST/MEDQ  D:  06/14/2008  T:  06/15/2008  Job:  [id number]

## 2011-05-01 NOTE — Discharge Summary (Signed)
NAMEHA, PLACERES NO.:  [id number]   MEDICAL RECORD NO.:  [id number]          PATIENT TYPE:  INP   LOCATION:  5158                         FACILITY:  [facility]   PHYSICIAN:  Ollen Gross. Vernell Morgans, M.D. DATE OF BIRTH:  28-Aug-1941   DATE OF ADMISSION:  08/10/2008  DATE OF DISCHARGE:  08/14/2008                               DISCHARGE SUMMARY   [name] is a 70 year old gentleman who was brought into the hospital  on August 10, 2008, for a ventral hernia repair with mesh.  He tolerated  the surgery very well.  Postoperatively, he had some issues on the first  postop night with oxygen desaturation.  He was started on CPAP, which  improved his oxygenation.  He also had some nausea on postop day #1.  By  postop day #2, his nausea was improving.  We did add some Reglan at this  point to help with his postop ileus and we were able to discontinue his  Foley.  On postop day #3, we gave him on Dulcolax suppository to help  with his bowel movement but his bowel function was returning and by  postop day #4, he was tolerating his diet and bowels were moving.  He  was ready for discharge home.  His drain was discontinued prior to  discharge.   MEDICATIONS:  He resumed his home meds.  He was given a prescription for  Vicodin for pain.   ACTIVITIES:  No heavy lifting.   Diet is as tolerated.   FINAL DIAGNOSIS:  Ventral hernia repair with mesh.   Follow up with Dr. Carolynne Edouard in a week and he was discharged home.      Ollen Gross. Vernell Morgans, M.D.  Electronically Signed     PST/MEDQ  D:  09/29/2008  T:  09/29/2008  Job:  [id number]

## 2011-05-04 NOTE — Discharge Summary (Signed)
NAMEARIA, PICKRELL NO.:  [id number]   MEDICAL RECORD NO.:  [id number]          PATIENT TYPE:  OBV   LOCATION:  2008                         FACILITY:  [facility]   PHYSICIAN:  Charlton Haws, M.D.     DATE OF BIRTH:  [birth year]   DATE OF ADMISSION:  03/25/2006  DATE OF DISCHARGE:  03/25/2006                                 DISCHARGE SUMMARY   PROCEDURE:  1.  2-D echocardiogram.  2.  Pulmonary function testing.   PRIMARY DIAGNOSIS:  Syncopal episode possibly micturition syncope.   SECONDARY DIAGNOSES:  1.  Status post aortocoronary bypass surgery on March 15, 2004 with left      internal mammary artery to left anterior descending, saphenous vein      graft to D1, saphenous vein graft to OM1 and circumflex and saphenous      vein graft to right coronary artery.  2.  Hypertension.  3.  Gastroesophageal reflux disease symptoms.  4.  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  5.  Status post inguinal hernia repair.  6.  Remote history of tobacco use.  7.  Hyperlipidemia.  8.  Benign prostatic hypertrophy.  9.  Family history of coronary artery disease.   HOSPITAL COURSE:  [name] is a 70 year old male who was discharged after  bypass surgery on March 19, 2006. He had shortness of breath and syncope that  occurred while urinating. He had felt weak since 2 nights ago and had some  shortness of breath. For the shortness of breath, he took an extra dose of  Lasix. The shortness of breath improved. After he took the Lasix, he got up  to go to the bathroom and when he started urinating he had sudden onset of  syncope. He was admitted for further evaluation and treatment.   [name] was evaluated and monitored overnight. He had no significant  cardiac arrhythmia. His labs were within normal limits except for a mildly  low sodium at 130. His magnesium and potassium were within normal limits.  His BNP was minimally elevated at  213. A TSH was within normal limits and  cardiac enzymes were negative.   A CT of his chest was performed which showed no pulmonary thromboembolism  but it showed a moderate pericardial effusion and a small left pleural  effusion. Dr. Laneta Simmers evaluated him and felt that further evaluation was  needed with a 2-D echocardiogram but the size of the effusion did not  account for his symptoms. He was doing 2000 to 2200 mL on incentive  spirometry. Pulmonary function testing was also performed but those results  are not available at this time. A 2-D echocardiogram performed today was  limited by poor acoustic windows but his EF was normal at 60% and no  pericardial effusion was seen. There were no significant valvular  abnormalities either.   On March 25, 2006 p.m., the 2-D echocardiogram results were reviewed by Dr.  Eden Emms. [name] was having no significant arrhythmia. Dr. Eden Emms felt  that [name] could be discharged  on March 25, 2006 with outpatient  followup arranged.   DISCHARGE INSTRUCTIONS:  His activity level is to be increased per cardiac  rehab guidelines. He is to call our office for any further problems. He is  to followup with Dr. Fabio Bering physician extender on April 19 at 9:45 and  with Dr. Laneta Simmers as scheduled.   DISCHARGE INSTRUCTIONS:  1.  Aspirin 325 mg a day.  2.  Toprol XL 25 mg a day.  3.  Zocor 20 mg a day.  4.  Flomax 0.4 mg daily.  5.  Protonix 20 mg a day.  6.  Oxycodone p.r.n.  7.  Lasix 20 mg is discontinued.      [name], P.A. [facility]    ______________________________  Charlton Haws, M.D.    RB/MEDQ  D:  03/25/2006  T:  03/26/2006  Job:  [id number]   cc:   Evelene Croon, M.D.  [address]  [place]  [place] [address]   Holley Bouche, M.D.  Fax: [phone]

## 2011-05-04 NOTE — Cardiovascular Report (Signed)
NAMESHALIK, SANFILIPPO                         ACCOUNT NO.:  [id number]   MEDICAL RECORD NO.:  [id number]                   PATIENT TYPE:  OIB   LOCATION:  2864                                 FACILITY:  [facility]   PHYSICIAN:  Jonelle Sidle, M.D. [facility]        DATE OF BIRTH:  [birth year]   DATE OF PROCEDURE:  04/27/2003  DATE OF DISCHARGE:  04/27/2003                              CARDIAC CATHETERIZATION   PRIMARY CARE PHYSICIAN:  Dr. Valentina Lucks.   [place] CARDIOLOGIST:  Dr. Charlton Haws.   INDICATION:  The patient is a 70 year old male with a prior history of  coronary artery disease documented by cardiac catheterization in 1996 at  [facility] (report not available).  He reportedly had moderate  disease of the right coronary artery as well as a 70% stenosis involving the  ramus intermedius branch.  He has some dyspnea on exertion and possibly a  history of sleep apnea.  He is being considered for abdominal surgery and is  referred as part of a preoperative evaluation.   PROCEDURES PERFORMED:  1. Left heart catheterization.  2. Right heart catheterization.  3. Selective coronary angiography.  4. Left ventriculography.   ACCESS AND EQUIPMENT:  The area about the right femoral artery and vein was  anesthetized with 1% lidocaine and a 6-French sheath was placed in the right  femoral artery via the modified Seldinger technique.  An 8-French sheath was  placed in the right femoral vein via the modified Seldinger technique.  Second, preformed 6-French JL4 and JR4 catheters were used for selective  coronary angiography and an angled pigtail catheter was used for left heart  catheterization and left ventriculography.  A 7.5-French balloon-tipped flow-  directed catheter was used for right heart catheterization and hemodynamic  assessment.  The patient tolerated the procedure well.  All exchanges were  made over a wire.  There were no immediate complications.   HEMODYNAMIC DATA:   Right atrium -- mean of 12.  Right ventricle -- 31/9.  Right femoral artery -- 33/17 with a mean of 24.  Pulmonary capillary wedge  pressure -- mean of 14.  Left ventricle -- 116/16.  Aorta -- 118/76.  Cardiac output 6.8 by thermodilution method and 5.2 by the Fick method.  Cardiac index 2.1 by thermodilution method and 2.4 by the Fick method.  Arterial saturation 96%.  Pulmonary artery saturation of 67%.   ANGIOGRAPHIC FINDINGS:  1. Left main coronary artery is free of significant flow-limiting coronary     atherosclerosis.  2. The left anterior descending has a 50-60% proximal stenosis, followed by     a 40% mid-vessel stenosis and a 30% distal stenosis.  There is a first     diagonal branch that bifurcates which has a 60% proximal stenosis.  3. The circumflex coronary artery has a 30% proximal stenosis.  There is a     large branching obtuse marginal that has a 40% midstenosis.  There is a     bend versus aneurysmal segment in the mid-circumflex level that initially     I thought could be an old occlusion of the A-V circumflex proper.  I     reviewed the films with Dr. Charlies Constable, who felt that the original     explanation was most likely.  4. There is a small ramus intermedius branch with a 70% proximal stenosis.  5. The right coronary artery is diffusely diseased.  There are 40-50%     proximal and mid-vessel stenoses with diffuse plaque burden otherwise.   LEFT VENTRICULOGRAPHY:  Left ventriculography was performed in the RAO  projection and revealed an ejection fraction of 55-60% with no significant  mitral regurgitation.   DIAGNOSES:  1. Coronary artery disease, as outlined, with a 50-60% proximal left     anterior descending stenosis, 60% diagonal stenosis, 70% small ramus     intermedius stenosis, 40% obtuse marginal stenosis, and 40-50% right     coronary artery stenoses.  The patient has fairly diffuse disease/plaque     burden.  2. No evidence of pulmonary hypertension  by right heart hemodynamics.  3. Left ventricular ejection fraction of 55-60%.   RECOMMENDATIONS:  I have reviewed the films in detail with Dr. Juanda Chance.  At  this point, we will start Toprol-XL, with consideration towards long-acting  nitrate as needed.  No clear indications for percutaneous intervention at  this time.  The patient will follow up to assess progress on medications in  preparation for planned abdominal surgery, which should be reasonable,  assuming the patient remains stable.                                               Jonelle Sidle, M.D. [facility]    SGM/MEDQ  D:  04/27/2003  T:  04/29/2003  Job:  [id number]

## 2011-05-04 NOTE — Assessment & Plan Note (Signed)
[facility]                              CARDIOLOGY OFFICE NOTE   NAME:Bennett, Samuel BRANDIS                      MRN:          [id number]  DATE:09/27/2006                            DOB:          1941/04/13    [name] returns today for followup.  He is status post CABG.  He had postop  pulmonary emboli.  He has completed more than 6 months of Coumadin.  He had  a followup non-contrast CT per Dr. Laneta Simmers dated September 17, 2006.  He had a  stable right middle lobe lung nodule.  There was no other adenopathy.   The patient has been doing well, he is not having any significant chest  pain, PND or orthopnea.   We had switched him to Coreg CR, since the Lopressor was questioned making  his hair thin and giving him alopecia.   He finds the Coreg CR too expensive.  I told him we could switch him to  generic carvedilol.   From a cardiac perspective he is otherwise doing well.  He is on Zocor for  hyperlipidemia.   I told him I thought at this point we could stop his Coumadin.  About 3  weeks after he stops it, we will do a hypercoagulability workup to make sure  that the PE was clearly related to his CABG in being postoperative, and we  will check a protein S, protein C antiphospholipid, lupus anticoagulant and  factor V Leiden.   As long as he does not have a hypercoagulable state, I suspect we can keep  him off his Coumadin.  He will return to taking an adult aspirin a day.   PHYSICAL EXAMINATION:  He looks well.  The blood pressure is 118/70, pulse is 80 and regular.  LUNGS:  Clear.  CAROTIDS:  Normal.  There is an S1 and S2 with normal heart sounds.  ABDOMEN:  Benign.  LOWER EXTREMITIES:  Intact pulses, no edema.   MEDICATIONS:  1. Zocor 20 mg a day.  2. Carvedilol 6.125 b.i.d.  3. Mucinex 600 mg b.i.d.  4. Coumadin as directed, which will be stopped.  5. He also takes omeprazole 20 mg a day for his stomach.   IMPRESSION:  Stable, status post  coronary artery bypass graft complicated by  perioperative pulmonary emboli.  Six months of Coumadin finished.  Discontinue Coumadin, resume aspirin, continue beta blocker and Zocor for  his coronary artery disease.  We will check hypercoagulable blood work in  three weeks off Coumadin.  Note should be made I also gave the wife a  prescription for Chantix, so she could try to stop smoking.  [name] stopped  smoking many years ago.  I will see him back in 3 months.            ______________________________  Noralyn Pick. Eden Emms, MD, [facility]     PCN/MedQ  DD:  09/27/2006  DT:  09/29/2006  Job #:  [id number]

## 2011-05-04 NOTE — Discharge Summary (Signed)
NAMEJAYZIAH, [name]               ACCOUNT NO.:  [id number]   MEDICAL RECORD NO.:  [id number]          PATIENT TYPE:  INP   LOCATION:  4706                         FACILITY:  [facility]   PHYSICIAN:  Melissa L. Ladona Ridgel, MD  DATE OF BIRTH:  01-Oct-1941   DATE OF ADMISSION:  03/31/2006  DATE OF DISCHARGE:  04/05/2006                                 DISCHARGE SUMMARY   CHIEF COMPLAINT ON ADMISSION:  Shortness of breath.   DISCHARGE DIAGNOSIS:  1.  Pulmonary embolus: The patient presented to [facility] with the      acute onset of shortness of breath. He had a recent hospitalization for      similar symptoms associated with syncopal episode. In the emergency room      he was found to have an elevated D-dimer. CT scan of the chest revealed      a right posterior basilar pulmonary embolus. The patient requested      transfer to the [facility] from [facility] because of his      physicians generally practicing here at this site. He was therefore      brought by [facility] to [facility] and directly admitted to a      floor bed. He had been started on heparin in the emergency room at [facility].  2.  Coronary artery disease, status post five vessel coronary artery bypass      graft at the end of March 2007. The patient has been doing well      postoperatively until the onset of these recent symptoms. We have      continued his postsurgical cardiac regime consisting of aspirin, statin,      and Lopressor. Dr. Laneta Simmers and [facility] were both contacted      regarding his admission.  3.  Hypercholesterolemia. The patient will continue with Zocor.  4.  Mild reflux. The patient was continued on a  proton pump inhibitor      during the course of his hospital stay.  5.  Benign prostatic hypertrophy. The patient was continued on his Flomax.   MEDICATIONS AT THE TIME OF DISCHARGE:  1.  Aspirin 325 mg once daily.  2.  Zocor 20 mg at bedtime.  3.  Flomax 0.4 mg  daily.  4.  Lopressor 50 mg b.i.d.  5.  Mucinex 600 mg b.i.d.  6.  Coumadin 5 mg at bedtime.  7.  Omeprazole 20 mg daily.   Arrangements were made with [facility] in  [place]. The patient is to report at 10 a.m. to the clinic and to bring  all of his medications. The patient is to return to Dr. Laneta Simmers on April 24th  and he is to reschedule his appointment with Dr. Eden Emms as an outpatient. We  have also requested that we see Dr. Romeo Apple in the next two to three weeks  to do a hospital follow-up visit.   HOSPITAL COURSE:  The patient is a very pleasant 70 year old gentleman who  recently had CABG of some five vessels  at the end of March 2007. The patient  was doing well until recently when he developed acute onset of shortness of  breath and one week prior to this admission had a similar event, but with a  syncopal episode. The patient was brought into the hospital at that time, he  was evaluated by CT angio which showed no obvious pulmonary embolus. He was  treated with some diuretics and was able to be discharged to home. The  patient returns to the [facility] on this admission with acute  onset of shortness of breath around 2:30 the day of admission. The shortness  of breath began at rest without symptoms of chest pain. In the emergency  room the patient was found to have elevated D-dimer and therefore underwent  CT angio of the chest which showed a right posterior basilar pulmonary  embolus at the periphery. The patient was started on heparin at [facility]  and because most of his physicians practice at [facility] he was  transferred to a direct admission bed.  The patient was continued on his  heparin and his Coumadin was started 24 hours after the start of heparin.  The patient elected not to use Lovenox during the course of his hospital  stay. His course was relatively unremarkable. He did have complaint of mild  shortness of breath in  the morning upon awakening. We therefore did do a  chest x-ray at the end of his admission to assure that there was no effusion  or congestive heart failure. The chest x-ray was within normal limits.   On the day of discharge the patient was hemodynamically stable, afebrile  with blood pressure of 104/65, pulse 80, respirations 18, saturation 95%.  His weight at the time of discharge was 218.9.   This is a well-developed, well-nourished white male in no acute distress. He  is normocephalic and atraumatic. Pupils equal, round, and reactive to light.  Extraocular muscles are intact.  Mucous membranes are moist. Neck is supple.  There is no JVD. No lymph nodes and no carotid bruits. His chest was clear  to auscultation. No rales, rhonchi, or wheezes. Cardiovascular was regular  rate and rhythm. Positive S1 and S2. No S3 or S4. No murmurs, rubs, or  gallops. Abdomen was soft, nontender, nondistended with positive bowel  sounds. Extremities showed no clubbing, cyanosis, or edema. Neurologically,  he is awake, alert, and oriented. Cranial nerves II-XII intact. Power is  5/5. DTRs are 2+.   Pertinent laboratory values during the course of the hospital stay reveal a  PT-INR of 26.8 and 2.5. His last hemoglobin was 12.1 with a hematocrit of  36.2. Platelets were 308,000.   At this time the patient seems stable and therapeutically anticoagulated. He  was discharged to home to follow up on Monday with [facility]  for his Coumadin.      Melissa L. Ladona Ridgel, MD  Electronically Signed     MLT/MEDQ  D:  04/07/2006  T:  04/07/2006  Job:  [id number]   cc:   Charlton Haws, M.D.  [address]  [address]  [place]  [place] [address]   Evelene Croon, M.D.  [address]  [place]  [place] [address]   Holley Bouche, M.D.  Fax: [phone]

## 2011-05-04 NOTE — Cardiovascular Report (Signed)
NAMEFARMER, MCCAHILL NO.:  [id number]   MEDICAL RECORD NO.:  [id number]          PATIENT TYPE:  OBV   LOCATION:  2008                         FACILITY:  [facility]   PHYSICIAN:  Arvilla Meres, M.D. LHCDATE OF BIRTH:  Oct 06, 1941   DATE OF PROCEDURE:  03/25/2006  DATE OF DISCHARGE:                              CARDIAC CATHETERIZATION   CARDIOLOGIST:  Charlton Haws, M.D.   REASON FOR ADMISSION:  Syncope and shortness of breath.   HISTORY OF PRESENT ILLNESS:  Mr. Arora is a 70 year old male with history  of hypertension, hyperlipidemia and coronary artery disease.  On March 15, 2006 he underwent 5-vessel CABG with Dr. Laneta Simmers.  EF at the time of  catheterization was 50%.  He was discharged home from the hospital about a  week ago on March 18, 2006.  He says since that time he has been feeling weak  and somewhat short of breath.  Two nights ago he called our on-call  physician, and was told to either come to the ER or try ab extra dose of  Lasix,  He took an extra dose of Lasix and shortness of breath felt much  better.  However, tonight he woke up  to go to the bathroom and felt  somewhat short of breath.  After he started urinating, he had an abrupt  syncopal episode.  He denies any traumatic injury.  He did not have any  antecedent chest pain, palpitations, neurologic symptoms or seizure  activity.  He came to quickly; he called EMS and was transported to the ER.  He denies any recent orthopnea or PND.  His lower extremity has resolved and  he has actually lost 12 pounds since surgery.  He has not had any fevers,  chills or cough.  His wife is quite concerned about his condition and saying  that he looks much better now that he has the oxygen.   REVIEW OF SYSTEMS:  As per HPI and problem list.  All other systems  negative.   PAST MEDICAL HISTORY:  1.  Coronary artery disease.      1.  Status post 5-vessel CABG, March 15, 2006;  by Evelene Croon, M.D.      2.  EF  of 50%.  2.  Hypertension.  3.  Hyperlipidemia.  4.  Benign prostatic hypertrophy.   MEDICATIONS:  1.  Aspirin 225 mg a day.  2.  Toprol XL 25 mg a day.  3.  Zocor 20 mg a day.  4.  Flomax 0.4 mg a day.  5.  Protonix 20 mg a day.  6.  Ultram.   ALLERGIES:  NO KNOWN DRUG ALLERGIES.   SOCIAL HISTORY:  [place] in [place] with [place] wife.  He is retired.  He previously worked at the [employer].  He has a history of  tobacco, but quit about 10 years ago.  Occasional alcohol.   FAMILY HISTORY:  Father died at 23 due to MI.  Mother also died in her early  57s due to lung cancer; she had a history of angina.   PHYSICAL  EXAMINATION:  GENERAL:  He is well-appearing, in no acute distress.  He is lying flat in bed.  Respirations are unlabored.  VITAL SIGNS:  Blood pressure is 101/70 with a pulse of 82.  Initial  saturations are 93%; it is unclear how much oxygen he was on at this time.  HEENT:  Sclerae anicteric.  EOMI.  There is no xanthelasma.  Mucous  membranes are moist.  NECK:  Supple.  There is no JVD.  Carotid are 2+ bilaterally without any  bruits.  There is no lymphadenopathy or thyromegaly.  CARDIAC:  Regular rate  and rhythm; with no murmurs, rubs or gallops.  The sternum appears stable.  The incision is healing nicely.  LUNGS:  Clear to auscultation.  ABDOMEN:  Soft, nontender, nondistended.  There is no hepatosplenomegaly.  No bruits.  EXTREMITIES:  Warm, with no cyanosis or clubbing.  There is no edema.  He  does have some mild ecchymosis on the medial aspect of his right thigh,  secondary to his vein graft harvest.  There is no swelling or cellulitis.  NEUROLOGIC:  He is alert and oriented x3.  Cranial nerves II-XII are intact.  He moves all four extremities without difficulty.  Affect is appropriate.   DIAGNOSTIC TESTING:  CHEST X-RAY:  Normal.  There is no air space disease.  EKG shows normal sinus rhythm at a rate of 80, with a chronic right bundle   branch block.  There are no acute ST-T wave changes.   LABORATORY DATA:  Sodium 131, potassium 4.3, chloride 100, potassium 26, BUN  13, creatinine 1.2.  White count 8.4, hemoglobin  12.4, platelet count 362,  Point-of-care Troponin less than 0.05.   ASSESSMENT:  1.  Syncopal episode; question micturition syncope.  2.  Coronary artery disease, status post coronary artery bypass grafting      March 15, 2006.  3.  Dyspnea.   DISCUSSION/PLAN:  I suspect he has micturition syncope.  However, given his  persistent reported dyspnea, which a chest CT has ruled out pulmonary  embolus and also a 2-D echocardiogram.  We will rule him out for an MI and  watch on telemetry for 23 hours to rule out arrhythmia )(because this is  much less likely in the setting of a normal EF).  Will hold Lasix for now  and check orthostatics.      Arvilla Meres, M.D. [facility]  Electronically Signed     DB/MEDQ  D:  03/25/2006  T:  03/25/2006  Job:  [id number]   cc:   Charlton Haws, M.D.  [address]  [address]  [place]  [place] [address]

## 2011-05-04 NOTE — Op Note (Signed)
NAME:  [name], [name]                         ACCOUNT NO.:  [id number]   MEDICAL RECORD NO.:  [id number]                   PATIENT TYPE:  INP   LOCATION:  0451                                 FACILITY:  [facility]   PHYSICIAN:  Ollen Gross. Vernell Morgans, M.D.              DATE OF BIRTH:  10-Sep-1941   DATE OF PROCEDURE:  08/08/2003  DATE OF DISCHARGE:                                 OPERATIVE REPORT   PREOPERATIVE DIAGNOSES:  Cholelithiasis.   POSTOPERATIVE DIAGNOSES:  Cholelithiasis.   OPERATION PERFORMED:  Laparoscopic cholecystectomy with intraoperative  cholangiogram.   SURGEON:  Ollen Gross. Carolynne Edouard, M.D.   ASSISTANT:  Gabrielle Dare. Janee Morn, M.D.   ANESTHESIA:  General endotracheal.   DESCRIPTION OF PROCEDURE:  After informed consent was obtained, the patient  was brought to the operating room and placed in supine position on the  operating table.  After adequate induction of general anesthesia, the  patient's abdomen was prepped with Betadine and draped in the usual sterile  manner.  The area above the umbilicus was infiltrated with 0.25% Marcaine.  A small incision was made with a 15 blade knife.  This incision was carried  down through the subcutaneous tissue bluntly with Kelly clamps and [employer]  retractors until the linea alba was identified.  The linea alba was incised  with a 15 blade knife.  Each side was grasped with Kocher clamps and  elevated anteriorly.  The preperitoneal space was then probed bluntly with a  hemostat until the peritoneum was opened and access was gained to the  abdominal cavity.  A 0 Vicryl pursestring suture was placed in the fascia  surrounding the opening and a Hasson cannula was placed through the opening  and anchored in place with the previously placed Vicryl pursestring stitch.  A laparoscope was inserted through the Hasson cannula.  There were some  adhesions to the anterior abdominal wall which were able to be gently swept  down with the tip of the  laparoscope until the opening was created into the  free space.  The abdomen was insufflated with carbon dioxide without  difficulty.  The right upper quadrant was able to be inspected and the dome  of the gallbladder and liver were readily identified.  The epigastric area  was then infiltrated with 0.25% Marcaine.  A small incision was made with a  15 blade knife and a 10 mm port was placed bluntly through this incision  into the abdominal cavity under direct vision.  Sites were then chosen  laterally on the right side of the abdomen with placement of 5 mm ports.  Each of these areas was infiltrated with 0.25% Marcaine.  Small stab  incisions were made with a 15 blade knife and 5 mm ports were placed bluntly  through these incisions into the abdominal cavity under direct vision.  The  gallbladder was very tense with fluid.  Aspirating device was  placed through  the 5 mm port and the tip of the gallbladder was punctured and aspirated and  clear bile was returned.  Once the gallbladder was decompressed, a blunt  grasper was placed through the lateral most 5 mm port and used to grasp the  dome of the gallbladder and elevate it anteriorly and superiorly.  Another  blunt grasper was placed through the other 5 mm port and used to retract on  the body and neck of the gallbladder.  A dissector was placed through the  epigastric port and using electrocautery, the peritoneal reflection of the  gallbladder neck was opened.  Blunt dissection was then carried out in this  area until the gallbladder neck cystic duct junction was readily identified  and a good window was created.  A single clip was placed on the gallbladder  neck.  A small ductotomy was made, a 14 gauge Angiocath was placed  percutaneously through the anterior abdominal wall.  A Reddick cholangiogram  catheter was placed through the Angiocath and flushed.  The Reddick catheter  was then placed within the cystic duct and anchored in place  with a clip.  The cholangiogram was obtained.  It showed no filling defects.  Good length  on the cystic duct and rapid emptying into the duodenum.  The anchoring clip  and the catheter was then removed from the patient.  Three clips were placed  proximally on the cystic duct and the duct was divided between the two sets  of clips.  Posterior to this, the cystic artery was identified and again  dissected in a circumferential manner bluntly until a good window was  created.  Two clips were placed proximally and one distally on the artery  and the artery was divided between the two.  Next, a laparoscopic hook  cautery device was used to separate the gallbladder from the liver bed.  Prior to completely detaching the gallbladder from the liver bed, the liver  bed was inspected and several small bleeding points were coagulated with the  electrocautery until the area was completely hemostatic.  The gallbladder  was then detached the rest of the way from the liver bed with the  electrocautery.  An endoscopic bag was placed through the epigastric port  and the gallbladder was placed within the bag and the bag was sealed.  The  abdomen was then irrigated with copious amounts of saline.  The liver bed  was inspected again and found to be hemostatic.  The laparoscope was then  removed to the epigastric port.  The gallbladder grasper was placed through  the Hasson cannula and used to grasp the opening of the bag.  The  gallbladder was then removed through the supraumbilical port without  difficulty.  The fascial defect was closed with the previously placed Vicryl  pursestring stitch as well as with another interrupted 0 Vicryl stitch.  The  rest of the ports were then removed under direct vision and the gas was  allowed to escape.  All the port sites were hemostatic.  The skin incisions  were closed with interrupted 4-0 Monocryl subcuticular stitches.  Benzoin, Steri-Strips and sterile dressings were  applied.  The patient tolerated the  procedure well.  At the end of the case all sponge, needle and instrument  counts were correct.  The patient was awakened and taken to the recovery  room in stable condition.  Ollen Gross. Vernell Morgans, M.D.    PST/MEDQ  D:  08/08/2003  T:  08/09/2003  Job:  [id number]

## 2011-05-04 NOTE — Discharge Summary (Signed)
NAMEMADDEN, [name]               ACCOUNT NO.:  [id number]   MEDICAL RECORD NO.:  [id number]          PATIENT TYPE:  INP   LOCATION:  2006                         FACILITY:  [facility]   PHYSICIAN:  Evelene Croon, M.D.     DATE OF BIRTH:  [birth year]   DATE OF ADMISSION:  03/15/2006  DATE OF DISCHARGE:  03/19/2006                                 DISCHARGE SUMMARY   ADMISSION DIAGNOSIS:  Three vessel coronary artery disease.   DISCHARGE DIAGNOSES:  1.  Three vessel coronary artery disease, status post coronary artery bypass      graft x5.  2.  Hyperlipidemia.  3.  Hypertension.  4.  Benign prostatic hypertrophy.  5.  Gastroesophageal reflux disease.   SERVICE:  CVTS.   CONSULTATIONS:  None.   PROCEDURE:  On March 15, 2006, the patient underwent a median sternotomy,  extracorporeal circulation, coronary artery bypass graft surgery x5 using a  left internal mammary artery graft to the left anterior descending artery  with a saphenous vein graft to diagonal branch to LAD, a sequential  saphenous vein graft to the intermediate obtuse marginal branched to the  left circumflex coronary artery and a saphenous vein graft to the right  coronary artery by Dr. Evelene Croon.  The patient also underwent endoscopic  vein harvesting from the right leg.   HISTORY AND PHYSICAL:  Mr. Mcknight is evaluated for coronary artery bypass  graft surgery.  He is a 70 year old gentleman with history of moderate  coronary artery disease dating back to 54.  The patient presented in March  with about a two month history of progressive dyspnea on exertion to the  point where he was getting short of breath with ambulating around the house.  He has had some shortness of breath at rest.  He has had fatigue and  decrease in energy level.  The patient denies any chest pain or pressure.  He has no pain in his neck, jaw or arms.  He underwent a Cardiolite scan  recently that showed an ejection fraction of 48% with some  ischemia in the  mid to distal anterior wall.  He subsequently underwent cardiac  catheterization on February 28, 2006 which showed about an 70-80% proximal LAD  stenosis after the takeoff of the large diagonal branch.  The diagonal  branch itself had about an 80% ostial stenosis.  The LAD had about 30-50%  mid-vessel stenosis and 90% distal lesion at the apex.  The left circumflex  to the branching marginal which had 50-60% stenosis. The right coronary  artery was a moderate-sized vessel that gave off posterior descending artery  and posterolateral branches.  It was diffusely diseased with a 50% proximal  and diffuse 50% mid-lesions.  There was also a 50% stenosis prior to the  posterior descending branch.  Left ventricular ejection fraction is about  50% with the mild hypokinesis in the mid to distal anterior wall.  There is  no mitral regurgitation.  It was thought the patient should undergo a  coronary artery bypass graft surgery and he agreed to continue.   HOSPITAL COURSE:  Postoperatively, the patient progressed as expected and  his stay remained uneventful.  On postoperative day #1, the patient was  remained afebrile with vital signs stable.  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  The patient was alert and neurology  was intact.   On March 16, 2006, the patient did have a vagal episode.  The patient did  remain stable afterwards.   On postoperative day #2, the patient was feeling better.  His Foley was  reinserted due to an inability to void.  The patient did have some volume  overload during his hospital stay.  He was treated with Lasix and potassium  chloride appropriately.  The patient did respond in an appropriate fashion.  The patient did have some acute blood loss anemia which has resolved on its  own without any blood transfusions.   The patient was doing his  breathing exercises appropriately.  He was  ambulating in the halls with a steady gait and progressing each day.   Postoperatively, the patient has progressed well.  His main concerns are his  bowel and bladder function.  The patient will be discharged home either  tomorrow or March 20, 2006 depending on if he is able to void and have a  bowel movement.  The patient is being weaned off his oxygen on March 18, 2006  per protocol.   PHYSICAL EXAMINATION:  The patient is afebrile.  Vital signs stable.  He is  normal sinus rhythm.  The patient's cardiac is regular rate and rhythm.  Lungs have crackles bilaterally at the bases and scattered rhonchi.  Abdomen  is benign.  Incisions are clear, dry and intact.  Extremities have trace  edema.   CONDITION ON DISCHARGE:  Stable.   DISPOSITION:  The patient will be discharged to home.   MEDICATIONS:  1.  Aspirin 325 mg p.o. daily.  2.  Toprol XL 25 mg p.o. daily.  3.  Zocor 20 mg p.o. daily.  4.  Flomax 0.4 mg p.o. daily.  5.  Protonix 20 mg p.o. daily.  6.  Oxycodone 1-2 tablets q.4h. p.r.n.   SPECIAL INSTRUCTIONS:  The patient was instructed to follow a low fat, low  salt diet.  He is to do no driving or heavy lifting greater than 10 pounds  for three weeks.  The patient may ambulate three to four times daily.  He is  to increase activity as tolerated.  The patient should continue his  breathing exercises.  The patient may shower and clean his wounds with mild  soap and water.  He is to call the office if any wound problems shall arise.   FOLLOW UP:  The patient has a follow-up appointment with Dr. Laneta Simmers on April 09, 2006 at 11:45.  The patient is to call Dr. Eden Emms for an appointment in  two weeks and the number is [phone].  The patient was instructed to call  the doctor's office if he experiences any chest pain, shortness of breath,  wound erythema or drainage, or temperature greater than 101.5.     Constance Holster, [place]       Evelene Croon, M.D.  Electronically Signed    JMW/MEDQ  D:  03/18/2006  T:  03/20/2006  Job:  [id number]   cc:   Evelene Croon, M.D.  [address]  [place]  [place] [address]  Charlton Haws, M.D.  [address]  [address]  [place]  [place] [address]   Patient's chart

## 2011-05-04 NOTE — H&P (Signed)
NAME:  ASHLEY, MONTMINY                         ACCOUNT NO.:  [id number]   MEDICAL RECORD NO.:  [id number]                   PATIENT TYPE:  EMS   LOCATION:  ED                                   [facility]ACILITY[facility]   PHYSICIAN:  Ollen Gross. Vernell Morgans, M.D.              DATE OF BIRTH:  11-29-1941   DATE OF ADMISSION:  08/08/2003  DATE OF DISCHARGE:                                HISTORY & PHYSICAL   HISTORY OF PRESENT ILLNESS:  Mr. Heinz is a 70 year old white male, who  presents with right upper quadrant pain that started acutely this morning.  He has been nauseated throughout the day but has not had any vomiting.  His  pain has worsened throughout the day to the point where he came to the  emergency department seeking further assistance.  He denies any fevers or  chills.  He has not had any chest pains or shortness of breath.  His pain is  localized in his right upper quadrant and does not radiate anywhere.  The  rest of his review of systems are unremarkable.   PAST MEDICAL HISTORY:  1. Coronary artery disease that is stable.  He had a cardiac cath back in     May that he reports was stable with a 70% blockage of one of his     coronaries which was stable disease since 1996.  2. He also has some hypertension.  3. Gastroesophageal reflux.  4. Sigmoid colon cancer.   PAST SURGICAL HISTORY:  1. Sigmoid colectomy and colostomy with subsequent reversal.  2. Left inguinal hernia repair with mesh.   MEDICATIONS:  Prilosec and lisinopril.   ALLERGIES:  No known drug allergies.   SOCIAL HISTORY:  He quit smoking seven years ago and denies any alcohol use.   FAMILY HISTORY:  Noncontributory.   PHYSICAL EXAMINATION:  VITAL SIGNS:  Temperature 97.3, blood pressure  143/75, pulse 84, O2 saturations 95%.  GENERAL:  He is a well-developed, well-nourished, white male in no acute  distress.  SKIN:  His skin is warm and dry with no jaundice.  EYES:  Extraocular movements intact.  Pupils equal,  round, and reactive to  light.  Sclerae nonicteric.  NECK:  No bruits.  I cannot palpate any thyroid masses.  Trachea is midline.  LUNGS:  Clear bilaterally with no use of accessory respiratory muscles.  HEART:  Regular rate and rhythm with an impulse in left chest.  ABDOMEN:  Soft with some mild to moderate right upper quadrant tenderness  but no evidence of peritonitis.  I cannot palpate any masses or  hepatosplenomegaly.  EXTREMITIES:  No cyanosis, clubbing, or edema.  PSYCHIATRIC/NEUROLOGIC:  He alert and oriented x 3 without any evidence  today of anxiety or depression.   REVIEW OF LABORATORY DATA:  His urine was normal.  His LFTs were normal.  His white count was 10,500 with a hemoglobin of 14.9,  hematocrit 44.4,  platelet count 233,000.  His amylase and lipase were normal.  On review of  his ultrasound, he has a large gallstone at the neck of his gallbladder.  No  evidence of ductal dilatation or thickening of the gallbladder wall.   ASSESSMENT AND PLAN:  This is a 70 year old gentleman with symptomatic  gallstones with one large stone lodged in the neck of his gallbladder.  I  have recommended he consider having his gallbladder removed, and he would  also like to have this done.  I have explained to him in detail the risks  and benefits of the procedure to remove the gallbladder as well as some of  the technical aspects including the possibility of needing an open procedure  since he has had prior abdominal surgery, and he understands and wishes to  proceed.  We will plan to do this for him in the operating room this  evening.                                               Ollen Gross. Vernell Morgans, M.D.    PST/MEDQ  D:  08/08/2003  T:  08/08/2003  Job:  [id number]

## 2011-05-04 NOTE — Cardiovascular Report (Signed)
NAME:  [name], [name] NO.:  [id number]   MEDICAL RECORD NO.:  [id number]          PATIENT TYPE:  OIB   LOCATION:  1963                         FACILITY:  [facility]   PHYSICIAN:  [name], M.D. LHCDATE OF BIRTH:  June 02, 1941   DATE OF PROCEDURE:  02/28/2006  DATE OF DISCHARGE:  02/28/2006                              CARDIAC CATHETERIZATION   PRIMARY CARE PHYSICIAN:  Samuel Bouche, MD.   CARDIOLOGIST:  Samuel Haws, MD.   IDENTIFICATION:  [name] is a 70 year old male with a history of COPD,  hypertension and nonobstructive coronary disease. Over the last few weeks he  has had progressive dyspnea on exertion, to the point where he cannot walk  more than 20 or 30 yards without getting short of breath. He denies any  significant chest discomfort. He underwent Cardiolite in the office, which  showed an EF of 48% with some ischemia in the mid to distal anterior wall.  He is thus referred for cardiac catheterization in the outpatient  laboratory.   PROCEDURES PERFORMED:  1.  Selective coronary angiography.  2.  Left heart catheterization.  3.  Left ventriculogram.  4.  Left subclavian angiogram.  5.  Right heart catheterization with Fick cardiac output.   DESCRIPTION OF PROCEDURE:  The risks and benefits of the catheterization  were explained to [name]. Consent was signed and placed on the chart. A  4-French arterial sheath was placed in the right femoral artery using a  modified Seldinger technique.  A 7-French sheath was placed in the right  femoral vein using a similar technique. A JL-5 catheter was used to image  the left coronary system.  A 3-D RC catheter was used to image the right  coronary system, and a left subclavian angled pigtail was used for the left  ventriculogram. All catheter exchanges were made over a wire. There were no  apparent complications.   FINDINGS:  1.  RA pressure:  Mean of 14.  2.  RV pressure:  31/12.  3.  PA  pressure:  32/21 with a mean of 26.  4.  Capillary wedge pressure:  16.  5.  Central aortic pressure:  137/90 with a mean of 111.  6.  LV pressure:  138/10 with EDP of 17.  There was no aortic stenosis.  7.  Fick cardiac output:  4.7 L/min.  8.  Fick cardiac index:  2.1 L/min/sq m.  9.  Pulmonary vascular resistance:  1.0 Woods units.  10. Femoral artery saturation:  95%.  11. Pulmonary artery saturations:  66% and 71% on room air.   CORONARY ANGIOGRAPHY:  1.  The left main was normal.  2.  The left anterior descending artery was a long vessel coursing to the      apex. It gave off a very large branching diagonal, proximally. There      were several septal perforators. In the proximal LAD, just after the      takeoff of the diagonal branch, there was a 70-80% focal stenosis. This      was followed by a long focal 40-50% stenosis. More distally,  there was      diffuse moderate disease with apical 90% lesion. In the ostium of the      first diagonal (which was large), there was an 80% tubular stenosis and      a 50% in the mid portion, with some diffuse disease.  3.  The left circumflex was a moderate-sized system. It gave off a small but      long ramus, a large branching OM-1. The distal AV groove circumflex was      small. In the proximal circumflex there was a 40% lesion.  In the mid      circumflex there was a 50-60% focal stenosis. The ramus was diffusely      diseased (an 80% long tubular lesion).  4.  The right coronary artery was a moderate-sized dominant vessel. It gave      off a large RV branch, a moderate-sized PDA and 4 small PLs. The RCA was      essentially diffusely diseased. There was a 50% proximal stenosis, 40-      50% diffuse lesions through the mid section, 50% distally prior to the      PDA, and 30-40% diffuse disease throughout the whole distal segment.      There was a 50% focal stenosis in the lower RV branch.   LEFT VENTRICULOGRAM:  Done in the RAO position,  showed an EF of about 50%  with mild hypokinesis of the mid to distal anterior wall. There was perhaps  a question of very mild mitral valve prolapse, but no significant MR.   ASSESSMENT:  1.  Three-vessel coronary artery disease as above, with a 70-80% proximal      LAD lesion; otherwise essentially nonobstructive disease in the major      vessels.  2.  Low normal left ventricular function with mid to distal anterior      hypokinesis.   PLAN/DISCUSSION:  This a difficult situation. Given the location of the LAD  lesion, I am not sure it will be easily amenable to angioplasty. He also has  significant disease in a large diagonal, which is just adjacent to the LAD  lesion. I will review with interventional colleagues, but we may need to  consider bypass surgery for him. He will obviously need aggressive risk  factor modification as well.   I have reviewed the films with Dr. Samule Bennett and we will refer the patient to  CVTS for consideration of surgical revascularization.      [name], M.D. [facility]  Electronically Signed     DB/MEDQ  D:  02/28/2006  T:  03/01/2006  Job:  [id number]   cc:   [name], M.D.  Fax: [phone]   [name], M.D.  [phone]  [address]  [place]  [place] [address]

## 2011-05-04 NOTE — Op Note (Signed)
NAME:  [name], [name]                         ACCOUNT NO.:  [id number]   MEDICAL RECORD NO.:  [id number]                   PATIENT TYPE:  AMB   LOCATION:  DSC                                  FACILITY:  [facility]   PHYSICIAN:  Maisie Fus B. Samuella Cota, M.D.               DATE OF BIRTH:  07-29-1941   DATE OF PROCEDURE:  05/05/2003  DATE OF DISCHARGE:                                 OPERATIVE REPORT   CCS#:  [id number]   PREOPERATIVE DIAGNOSIS:  Tender mass lower midline of abdomen following  surgery for sigmoid carcinoma and following placement of mesh for an  incisional hernia.   POSTOPERATIVE DIAGNOSIS:  Tender mass lower midline of abdomen following  surgery for sigmoid carcinoma and following placement of mesh for an  incisional hernia.   OPERATION:  Exploration of lower anterior abdominal wound with removal of  atrium mesh and drainage.   SURGEON:  Maisie Fus B. Samuella Cota, M.D.   ANESTHESIA:  General, Maren [place], M.D. and CRNA   DESCRIPTION OF PROCEDURE:  The patient was taken to the operating room,  placed on the table in supine position and after satisfactory general  anesthetic with LMA intubation, the lower abdomen was prepped and draped in  a sterile field. The patient had a previous lower midline incision and an  incision was made in the midline through the old scar. This was taken down  the skin and subcutaneous tissue. There was a palpable mass just off to the  left of midline. This was entered and there was noted to be some bunched up  atrium mesh which was removed. One or two Novofil sutures which had been  placed to hold the mesh were also removed. There was some granulation tissue  but no frank purulent drainage. The cavity was cultured. The granulation  tissue was curetted and cauterized. The cavity seemed clean and was  copiously irrigated. I did not feel anymore mesh in the wound. The midline  fascia was closed with two figure-of-eight sutures of 2-0 Novofil in the  superior portion, the lower part was left open. A 1/4 inch Penrose drain was  placed into the cavity and brought out through the lower end of the wound.  The wound was again  copiously irrigated, skin was closed loosely with 4-0 nylon as vertical  mattress sutures, the drain was sutured to the skin and a safety pin was  also placed through it. A dry sterile dressing was applied. The patient  seemed to tolerate the procedure well and was taken to the PACU in  satisfactory condition.                                               Thomas B. Samuella Cota, M.D.    TBP/MEDQ  D:  05/05/2003  T:  05/05/2003  Job:  [id number]   cc:   Tama Headings. Marina Goodell, M.D.  [address]  [place]  [place] [address]  Fax: [phone]   Charlton Haws, M.D.   Vania Rea. Jarold Motto, M.D. [facility]

## 2011-05-04 NOTE — Assessment & Plan Note (Signed)
[facility]                       [place] CARDIOLOGY OFFICE NOTE   NAME:Caswell, MANSON LUCKADOO                      MRN:          [id number]  DATE:12/30/2006                            DOB:          1941-09-20    Vertis returns today for followup.  He has multiple issues to discuss.   He is status post coronary artery bypass surgery.   This was complicated by a postop DVT and pulmonary embolus   He finished a 6 month course of Coumadin.  We stopped his Coumadin a few  weeks ago.   He has been doing well.   Since his Coumadin was stopped we took the liberty to check a  hypercoagulable panel.   I received the results of these.  His factor 5 leiden, protein, S&C,  antiphospholipid lipid antibodies were all within a normal range and  there was no evidence of hypercoagulability.   There is also a question of side effects with some medications.   The patient's hair has been thinning, he was concerned that this was due  to his Carvedilol.  We had stopped his long-acting form and switched him  to Coreg.  He felt this was too expensive and we then switched him to  generic which he has only been taking once in the morning.  His hair  continues to be thin.   He was wondering if the Zocor could be contributing.   In regards to his heart, he has not had any significant chest pain or  recurrent pleuritic pain or shortness of breath.  Review of systems  otherwise negative except for his thinning hair.   His medications include:  1. Zocor 20 daily.  2. Carvedilol 6.125 mg supposedly b.i.d., but only taking once a day.  3. Mucinex 600 b.i.d.  4. An aspirin a day.  5. Prilosec p.r.n.   EXAMINATION:  Is remarkable for blood pressure of 130/88, pulse 84 and  regular.  HEENT:  Is normal.  His carotids are normal.  There is no lymphadenopathy, there is no  carotid bruits.  LUNGS:  Are clear.  There is a normal S1, S2.  Normal heart sound.  He is status  post  sternotomy.  He has a midline scar in his abdomen.  The bowel sounds are positive.  There is no AAA.  Distal pulses are intact with no edema.  There is no evidence of DVT.  NEURO:  Is nonfocal.   IMPRESSION:  Status post coronary artery bypass surgery with ejection  fraction in the 45% range.   Continue current medication including aspirin and beta blocker.   His blood pressure and pulse are somewhat high.  I explained to him that  on the generic Coreg he needs to be taking the drug twice a day.  He  will start.   I do not think he needs an ACE-inhibitor at this time, but we will  recheck his left ventricular function in 6 months.   It had been greater than 45% in the past.  In regards to his thinning  hair, I do not think it is from Zocor but  I gave him the option to stop  it for a month and see how it is, clearly it was not due to Carvedilol,  but I think we will maintain him on the generic twice a day, short  acting drug.   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  He has had a follow up CT scan which only showed  pulmonary nodule and no recurrent clot.  He needs this followed up in  March with a CT scan at our office in regards to his nodule and we will  keep him off of his Coumadin.  His hypercoagulability panel was  negative.  He will be maintained only on aspirin for his coronary  disease.   We will check LFTs and his cholesterol in 6 months, hopefully he will  continue with Zocor unless he thinks that it is contributing to his thin  hair.   Overall, I am pleased with his progress, although his postop course was  a little rocky, he seems to be doing better and he is particularly happy  to be off of his Coumadin.  I will see him in about 6 months.     Noralyn Pick. Eden Emms, MD, [facility]  Electronically Signed    PCN/MedQ  DD: 12/30/2006  DT: 12/30/2006  Job #: [id number] [id number]

## 2011-05-04 NOTE — Assessment & Plan Note (Signed)
[facility]                         GASTROENTEROLOGY OFFICE NOTE   NAME:[name], [name]                      MRN:          [id number]  DATE:11/19/2006                            DOB:          January 13, 1941    PROBLEM:  For followup colonoscopy.   HISTORY:  [name] is a pleasant 70 year old white male known to Dr.  Jarold Motto who has history of colon cancer diagnosed in 70.  He is  status post sigmoid resection.  He also has history of colon polyps and  has undergone serial colonoscopies.  His last colonoscopy was done  January 08, 2005.  At that time, he was noted to have several polyps in  the descending colon and diverticulosis.  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   The patient says that he has had some change in his bowel habits over  the past 6 months with alternating constipation and loose stools.  He  has no associated abdominal pain or cramping, no melena or hematochezia,  and says he is actually tending more towards the constipated side.  This  has occurred since he underwent coronary artery bypass grafting March 15, 2006.  He has been doing well from a cardiac standpoint.  He did  suffer a postoperative pulmonary embolus and had been on Coumadin which  has since been discontinued.  He was last seen by Dr. Eden Emms September 27, 2006, and at that time was felt to be stable.   CURRENT MEDICATIONS:  1. Zocor 20 mg daily.  2. Mucinex 600 b.i.d. p.r.n.  3. Aspirin 325 daily.  4. Carvedilol 6.25 daily.  5. Prilosec 20 mg p.o. daily.   ALLERGIES:  No known drug allergies.   EXAMINATION:  GENERAL:  Well-developed white male in no acute distress.  VITAL SIGNS:  Blood pressure 126/88, pulse is 88, weight is 229.  CARDIOVASCULAR:  Regular rate and rhythm with S1 and S2.  No murmur,  rub, or gallop.  PULMONARY:  Clear to A&P.  ABDOMEN:  Soft, bowel sounds are active, he is nontender.  There is no  palpable mass or  hepatosplenomegaly.  RECTAL:  Exam not done today.   IMPRESSION:  98. A 70 year old male with history of colon cancer status post sigmoid      resection in 1992 with history of adenomatous polyps, now due for      followup colonoscopy.  2. Coronary artery disease status post coronary artery bypass grafting      March 2007.  3. Postoperative pulmonary embolus.  4. New change in bowel habits, alternating, constipation predominant.   PLAN:  1. Schedule colonoscopy.  2. The patient will remain on his aspirin.  3. Start Fibersure one packet daily.      Mike Gip, PA-C  Electronically Signed      Vania Rea. Jarold Motto, MD, [facility], FAGA  Electronically Signed   AE/MedQ  DD: 11/19/2006  DT: 11/20/2006  Job #: [id number]

## 2011-05-04 NOTE — Assessment & Plan Note (Signed)
[facility]                              CARDIOLOGY OFFICE NOTE   NAME:[name], [name]                      MRN:          [id number]  DATE:07/08/2006                            DOB:          [birth year]    [name] returns in followup.  Since his CABG he is doing well.   He had postoperative PE and has been on Coumadin.  His INRs have been good.  He was somewhat tachycardic last time I saw him.  Repeat CT scan showed  resolution of is PE and no new findings.  He is doing better now.  However,  he has noticed significant alopecia and hair loss.  He believes this is from  the Lopressor.  We will try to switch him to Coreg CR 20 mg.  If he has  continued hair loss in a month, I will have to taper this and stop his beta  blocker.   His cholesterol is being controlled with Zocor.   PHYSICAL EXAMINATION:  GENERAL:  He looks well.  VITAL SIGNS: Pulse 88, blood pressure 130/70.  LUNGS:  Clear.  CARDIOVASCULAR:  Normal heart sounds.  ABDOMEN:  Benign.  EXTREMITIES:  Intact pulses.  No edema.   IMPRESSION:  Status post coronary artery bypass grafting with postoperative  pulmonary embolus.  Continue Coumadin until later this year.  Stop Lopressor  and see if alopecia improves.  Supplement with Coreg CR.  Follow up in a  month to adjust medicines further and check on hair loss.                               Noralyn Pick. Eden Emms, MD, [facility]    PCN/MedQ  DD:  07/08/2006  DT:  07/08/2006  Job #:  [id number]

## 2011-05-04 NOTE — Op Note (Signed)
[name], [name]               ACCOUNT NO.:  [id number]   MEDICAL RECORD NO.:  [id number]          PATIENT TYPE:  INP   LOCATION:  2006                         FACILITY:  [facility]   PHYSICIAN:  Evelene Croon, M.D.     DATE OF BIRTH:  06/01/1941   DATE OF PROCEDURE:  03/15/2006  DATE OF DISCHARGE:                                 OPERATIVE REPORT   PREOPERATIVE AND POSTOPERATIVE DIAGNOSIS:  Severe three-vessel coronary  disease.   OPERATIVE PROCEDURE:  Median sternotomy, extracorporeal circulation,  coronary bypass graft surgery x 5 using a left internal mammary artery graft  to left anterior descending coronary artery, with a saphenous vein graft to  diagonal branch of the LAD, a sequential saphenous vein graft to the  intermediate and obtuse marginal branches of the left circumflex coronary  artery, and a saphenous vein graft to the right coronary artery.  Endoscopic  vein harvesting from the right leg.   ATTENDING SURGEON:  Evelene Croon, M.D.   ASSISTANT:  Charlett Lango, MD   SECOND ASSISTANT:  Charlesetta Garibaldi, [facility]   ANESTHESIA:  General endotracheal.   CLINICAL HISTORY:  This patient is a 70 year old gentleman with history of  moderate coronary disease dating back to 1996.  He now presents with a two-  month history of progressive exertional dyspnea.  He has also had fatigue  and decreased energy level but no chest pain.  He had a Cardiolite scan done  that showed an ejection fraction of 48% with some ischemia in the mid to  distal anterior wall.  Cardiac catheterization on 02/28/2006 showed 70-80%  proximal LAD stenosis after the take off a large diagonal branch.  The  diagonal itself had about 80% ostial stenosis.  The LAD also had 40 to 50%  midvessel stenosis and a 90% distal lesion near the apex.  Left circumflex  gave off a large branching marginal that about 50 to 60% stenosis.  There is  a moderate-sized intermediate vessel that had a high-grade proximal  stenosis.  The right coronary was a moderate size vessel that gave off small  posterior descending and posterolateral branches.  It was diffusely diseased  with a 50% proximal and 50% diffuse midvessel stenosis.  There is also about  50% stenosis prior to the posterior descending branch.  Left ventricular  ejection fraction about 50% with mild hypokinesis of the mid to distal  anterior wall.  There is no mitral regurgitation.  After review of the  angiogram and examination of the patient it was felt that coronary bypass  graft surgery the best treatment to prevent further ischemia and infarction.  I discussed the operative procedure with the patient and his wife including  alternatives, benefits, and risks including bleeding, blood transfusion,  infection, stroke, myocardial infarction, graft failure, and death.  He  understood and agreed to proceed.   OPERATIVE PROCEDURE:  The patient was taken to the operating room, placed on  table supine position.  After induction of general endotracheal anesthesia a  Foley catheter was placed in bladder using sterile technique.  Then the  chest, abdomen and both  lower extremities were prepped and draped in usual  sterile manner.  The chest was entered through the median sternotomy  incision.  The pericardium opened in midline.  Examination heart showed good  ventricular contractility.  The ascending aorta had no palpable plaques in  it.   The left internal mammary artery was harvested from the chest wall as  pedicle graft.  This is a medium caliber vessel with excellent blood flow  through it.  At same time, segment of greater saphenous vein was harvested  from the right leg using endoscopic vein harvest technique.  This vein was  of medium size and good quality.   Then the patient was heparinized and when an adequate activated clotting  time was achieved.  The distal ascending aorta was cannulated using 20-  French aortic cannula for arterial  inflow.  Venous outflow was achieved  using a two-stage venous cannula through the right atrial appendage.  Antegrade cardioplegia and vent cannula was inserted aortic root.   The patient placed on cardiopulmonary bypass and distal coronaries  identified.  The LAD was diffusely diseased.  The diagonal branch was a  moderate-sized vessel that had severe proximal disease in it.  The  intermediate was visible proximally where it was small but graftable vessel.  The obtuse marginal was intramyocardial throughout its entire extent.  It  was located in its midportion where there was moderate disease present.  The  vessel was still soft enough to graft in this location.  I had also found  the vessel distal to this but it was diffusely diseased extending down to  the small sub-branches.  The right coronary artery was also diffusely  diseased.  It was located distally just before the take off of the posterior  descending branch where there was moderate disease but was it was graftable  here.  The posterior descending and posterolateral branches were small  vessels that were not graftable themselves.   Then the aorta was crossclamped and 600 mL of cold blood antegrade  cardioplegia was administered and aortic root with quick arrest of the  heart.  Systemic hypothermia to 28 degrees centigrade and topical  hypothermic dressing was used.  Temperature probe placed in septum  insulating pad in the pericardium.   The first distal anastomosis was performed to the intermediate coronary  artery.  The internal diameter of this vessel was about 1.6 mm.  Conduit  used was segment of greater saphenous vein.  The anastomosis performed in  sequential side-to-side manner using continuous 7-0 Prolene suture.  Flow  measured through the graft and was excellent.   Second distal anastomosis was performed to the obtuse marginal branch.  The internal diameter this vessel was about 2 mm.  Conduit used was same  segment  greater saphenous vein.  The anastomosis performed sequential end-to-side  manner using continuous 7-0 Prolene suture.  Flow was measured through the  graft and was excellent.  Then dose of cardioplegia given down vein graft  and aortic root.   The third distal anastomosis was formed to the diagonal branch.  The  internal diameter was 1.6 mm.  Conduit used was a second segment of greater  saphenous vein.  The anastomosis performed end-to-side manner using  continuous 7-0 Prolene suture.  Flow measured through the graft and was  excellent.   The fourth distal anastomosis was then performed to the distal right  coronary artery.  The internal diameter was about 2 mm.  Conduit used was a  third segment of greater saphenous vein and anastomosis performed end-to-  side manner continuous of the procedure.  Flow was noted through the graft  and was excellent.  Another dose of cardioplegia given down vein grafts and  in aortic root.   The fifth distal anastomosis was formed to the distal portion of left  anterior descending coronary.  The internal diameter was 1.75 mm.  Conduit  used was a left internal mammary graft, this was brought through an opening  in left pericardium anterior to phrenic nerve.  Anastomosed to LAD in end-to-  side manner using continuous 8-0 Prolene suture.  The pedicle was sutured  the epicardium 6-0 Prolene sutures.  The patient rewarmed 37 degrees  centigrade.  With the crossclamp in place, three proximal vein graft  anastomosis were performed the aortic root end-to-side manner using  continuous 6-0 Prolene suture.  The clamp was then removed from mammary  pedicle.  There is rapid warming of ventricular septum and return of  spontaneous ventricular fibrillation.  The crossclamp removed with time 101  minutes.  The patient spontaneously converted sinus rhythm.   The proximal and distal anastomoses appeared hemostatic, line of the graft  satisfactory.  Graft  markers placed on the proximal anastomosis.  Two  temporary right ventricular and right atrial pacing wires placed and brought  out through the skin.   When the patient rewarmed to 37 degrees centigrade, he was weaned from  cardiopulmonary bypass on no inotropic agents.  Total bypass time was 126  minutes.  Cardiac function appeared excellent with cardiac output of 5  liters a minute.  Protamine was given and the venous and aortic cannulas  removed without difficulty.  Hemostasis was achieved.  Three chest tubes  were placed with tube in the posterior pericardium, one in left pleural  space, one in anterior mediastinum.  The pericardium was then closed over  the heart.  Sternum closed #6 stainless steel wires.  Fascia was closed with  continuous #1 Vicryl suture.  Subcu tissue was closed with continuous 2-0  Vicryl and skin with 3-0 Vicryl subcuticular closure.  The lower extremity vein harvest site was closed in layers in similar manner.  The sponge,  needle, instrument counts correct according to scrub nurse.  Dry sterile  dressing applied over the incisions around the chest tubes which were Pleur-  Evac suction.  The patient remained hemodynamically stable, transferred to  the SICU in guarded but stable condition.      Evelene Croon, M.D.  Electronically Signed     BB/MEDQ  D:  03/15/2006  T:  03/18/2006  Job:  [id number]   cc:   [facility]   Cardiac cath lab

## 2011-05-04 NOTE — H&P (Signed)
[name], [name]               ACCOUNT NO.:  [id number]   MEDICAL RECORD NO.:  [id number]          PATIENT TYPE:  INP   LOCATION:  4706                         FACILITY:  [facility]   PHYSICIAN:  Melissa L. Ladona Ridgel, MD  DATE OF BIRTH:  1941/12/01   DATE OF ADMISSION:  03/31/2006  DATE OF DISCHARGE:                                HISTORY & PHYSICAL   CHIEF COMPLAINT:  Acute onset of shortness of breath.   PRIMARY CARE PHYSICIAN:  Holley Bouche, M.D.   CARDIOLOGIST:  Charlton Haws, M.D.   CARDIOTHORACIC SURGEON:  Evelene Croon, M.D.   HISTORY OF PRESENT ILLNESS:  The patient is a 70 year old white male with a  past medical history for CAD, status post CABG x5 vessels on March 15, 2006.  The patient states that today at 2:30 he noted the acute onset of shortness  of breath at rest without the symptoms of chest pain.  The patient states  that he had a similar event last Sunday which was much worse.  He was  hospitalized for evaluation by cardiology.  The patient actually had a  syncopal episode related to the previous event on March 25, 2006.  He was  treated with diuretics and underwent CT angiography which was negative for  pulmonary embolus at the time.  It did show a mild pericardial effusion, and  the echocardiogram showed a good ejection fraction.  The patient's diuretics  were adjusted, and he was discharged to home for followup.  The patient  states he was feeling well until today at 2:30 when he developed acute onset  of shortness of breath.  He has noticed some shortness of breath with  exertion as well.  He does not relate any PND, orthopnea, and as stated, no  chest pain.  The patient has been having some right shoulder discomfort  since his surgery and describes fourth and fifth digits of his left hand  which feel like pins and needles, but this has been since his surgery and is  not new.   REVIEW OF SYSTEMS:  As stated, no chest pain, no PND, no orthopnea.  No  fever, no  chills.  No diarrhea, no constipation.  Right shoulder pain has  been chronic since his surgery as well as fourth and fifth digit numbness.  All other review of systems are negative.   PAST MEDICAL HISTORY:  1.  Hypertension.  2.  Hyperlipidemia.  3.  CAD, status post CABG on March 15, 2006 by Dr. Laneta Simmers.  His last      ejection fraction was 50%.  4.  BPH.   PAST SURGICAL HISTORY:  CABG.   SOCIAL HISTORY:  He quit tobacco about 10 years ago.  He denies any ethanol  use, although previous discharge summary states that he did occasionally use  alcohol.  He is married and used to work in the [employer] business.   FAMILY HISTORY:  Mom is deceased secondary to lung cancer.  Dad died in his  54s secondary to myocardial infarction.   ALLERGIES:  NO KNOWN DRUG ALLERGIES.   CURRENT MEDICATIONS:  1.  Aspirin 325 mg daily.  2.  His Toprol-XL was changed to Lopressor 50 mg q.12h.  3.  Zocor 20 mg daily.  4.  Flomax 0.4 mg daily.  5.  He was on Protonix but is now on omeprazole 20 mg daily.  6.  He was using tramadol 50 mg q.4h. p.r.n. but has not needed that      recently.  7.  Mucinex 600 mg b.i.d.   PHYSICAL EXAMINATION:  VITAL SIGNS:  The patient's vital signs from the  emergency room revealed a temperature of 96.8, blood pressure 129/89, pulse  95, respirations 18, saturation 96%.  He was mildly hypotensive on  admission, but this resolved during the course of the hospital stay.  GENERAL:  He is in no acute distress.  HEENT:  Normocephalic, atraumatic.  Pupils are equal, round, and reactive to  light.  Extraocular muscles are intact.  Mucous membranes are moist.  NECK:  Supple.  There is no JVD, no lymph nodes, no carotid bruits.  CHEST:  Clear to auscultation.  There are no rhonchi, rales, or wheezes.  CARDIOVASCULAR:  Regular rate and rhythm.  Positive S1 and S2.  No S3 or S4.  No murmurs, rubs, or gallops.  His sternotomy scar is well-healing.  ABDOMEN:  Soft, nontender,  nondistended.  Positive bowel sounds.  EXTREMITIES:  No clubbing, cyanosis, or edema.  He does have an area of well-  healing scar related to his saphenous vein graft harvest site on the right  leg.  NEUROLOGIC:  He appears to be intact, although he does complain of some  dysthesias over the fourth and fifth digits of the left hand.  DTRs are 2+.   LABORATORY DATA:  PTT 38,  PT 14.3, INR 1.1.  His ABG shows a pH of 7.44,  PCO2 31.3, PO2 70.1, and a bicarb of 21.2.  His D-dimer was 3.03.  Cardiac  enzymes were negative x1.  His white count was 6.7, hemoglobin 12.7,  hematocrit 38.4, platelets 410.  Sodium 133, potassium 3.9, chloride 102,  CO2 27, BUN 11, creatinine 1.2, glucose 93.  BNP 116.  Previous review of  laboratories from the 9th revealed a TSH of 1.67.   EKG showed an unchanged right bundle branch block.  No ST-T wave changes  were noted.  Chest x-ray showed no congestive heart failure.  CT of the  chest showed a right posterior basilar pulmonary embolus in the periphery.   ASSESSMENT:  This is a 70 year old white male status post coronary artery  bypass graft x5 vessels in March of 2007 who presents with acute onset of  shortness of breath with a previous episode one week prior related with  syncope.  Today, the patient was found to have a pulmonary embolus, although  his previous computed tomography scan last week did not show a pulmonary  embolus.  The patient was started at an outside hospital on heparin and  transferred to [facility] for further care, as most of his  physicians are here at this site.   PLAN:  1.  New pulmonary embolus, currently without symptoms of dyspnea.  We will      continue his heparin.  We will check on his eligibility/willingness to      participate in [facility] pulmonary embolus study.  If he is not eligible     and nor willing, then we will start him on Coumadin this evening.  2.  Cardiovascular.  There is no complaint of  chest pain.   We will continue      him on his home antihypertensive medications and statins.  3.  GI.  We will continue his omeprazole.  4.  GU.  For his history of BPH, we will continue his Flomax.  5.  Endocrine.  As stated, for his hyperlipidemia, we will continue his      Zocor.  TSH does not need to be drawn, as      it was normal on the 9th.  6.  DVT prophylaxis.  We will check ultrasounds of the lower extremities to      rule out DVT related to his recent surgery and continue his heparin and      start him on Coumadin.      Melissa L. Ladona Ridgel, MD  Electronically Signed     MLT/MEDQ  D:  03/31/2006  T:  03/31/2006  Job:  [id number]   cc:   Evelene Croon, M.D.  [address]  [place]  [place] [address]   Charlton Haws, M.D.  [address]  [address]  [place]  [place] [address]   Holley Bouche, M.D.  Fax: [phone]

## 2011-05-07 ENCOUNTER — Other Ambulatory Visit (INDEPENDENT_AMBULATORY_CARE_PROVIDER_SITE_OTHER): Payer: Medicare Other | Admitting: *Deleted

## 2011-05-07 DIAGNOSIS — I719 Aortic aneurysm of unspecified site, without rupture: Secondary | ICD-10-CM

## 2011-05-07 DIAGNOSIS — I25810 Atherosclerosis of coronary artery bypass graft(s) without angina pectoris: Secondary | ICD-10-CM

## 2011-05-07 LAB — BASIC METABOLIC PANEL
BUN: 11 mg/dL (ref 6–23)
CO2: 29 mEq/L (ref 19–32)
Calcium: 9.3 mg/dL (ref 8.4–10.5)
GFR: 70.19 mL/min (ref >60.00)
Glucose, Bld: 69 mg/dL — ABNORMAL LOW (ref 70–99)
Potassium: 4 mEq/L (ref 3.5–5.1)
Sodium: 139 mEq/L (ref 135–145)

## 2011-09-13 LAB — DIFFERENTIAL
Basophils Absolute: 0
Basophils Relative: 1
Basophils Relative: 0
Eosinophils Absolute: 0.1
Eosinophils Absolute: 0.1
Eosinophils Relative: 2
Eosinophils Relative: 1
Lymphs Abs: 1.3
Monocytes Relative: 9
Neutrophils Relative %: 54
Neutrophils Relative %: 66

## 2011-09-13 LAB — CBC
HCT: 43.4
HCT: 41.5
MCHC: 33.3
MCHC: 33.1
MCV: 84.4
MCV: 84.7
Platelets: 187
RBC: 4.9
RDW: 14.9

## 2011-09-13 LAB — BASIC METABOLIC PANEL
BUN: 13
BUN: 9
CO2: 29
Chloride: 104
Chloride: 102
Creatinine, Ser: 1.17
GFR calc non Af Amer: >60
Glucose, Bld: 90
Glucose, Bld: 95
Potassium: 4
Potassium: 4.6
Sodium: 138

## 2011-09-13 LAB — POCT CARDIAC MARKERS: Myoglobin, poc: 73.8

## 2011-09-13 LAB — POCT I-STAT, CHEM 8
BUN: 11
Chloride: 101
Creatinine, Ser: 1.3
Glucose, Bld: 98
Potassium: 4.2
Sodium: 138

## 2011-10-23 ENCOUNTER — Ambulatory Visit: Payer: Medicare Other | Admitting: Cardiology

## 2011-11-12 ENCOUNTER — Ambulatory Visit (INDEPENDENT_AMBULATORY_CARE_PROVIDER_SITE_OTHER): Payer: Medicare Other | Admitting: Cardiology

## 2011-11-12 ENCOUNTER — Encounter: Payer: Self-pay | Admitting: Cardiology

## 2011-11-12 DIAGNOSIS — I10 Essential (primary) hypertension: Secondary | ICD-10-CM

## 2011-11-12 DIAGNOSIS — I719 Aortic aneurysm of unspecified site, without rupture: Secondary | ICD-10-CM

## 2011-11-12 DIAGNOSIS — R0602 Shortness of breath: Secondary | ICD-10-CM

## 2011-11-12 DIAGNOSIS — I712 Thoracic aortic aneurysm, without rupture: Secondary | ICD-10-CM

## 2011-11-12 DIAGNOSIS — IMO0001 Reserved for inherently not codable concepts without codable children: Secondary | ICD-10-CM

## 2011-11-12 DIAGNOSIS — E785 Hyperlipidemia, unspecified: Secondary | ICD-10-CM

## 2011-11-12 DIAGNOSIS — I4891 Unspecified atrial fibrillation: Secondary | ICD-10-CM

## 2011-11-12 DIAGNOSIS — I2510 Atherosclerotic heart disease of native coronary artery without angina pectoris: Secondary | ICD-10-CM

## 2011-11-12 MED ORDER — DIAZEPAM 5 MG PO TABS: ORAL_TABLET | ORAL | Status: DC

## 2011-11-12 NOTE — Assessment & Plan Note (Addendum)
CAD s/p CABG.  Last echo in 2/12 showed EF 50% with inferior hypokinesis.  Prior echo had EF 55-60%.  Last cath showed moderate (50%) stenosis in SVG-RCA.  Given the inferior hypokinesis that was not commented upon on prior echoes and his exertional dyspnea earlier this year, I had him get a [employer]  This showed no evidence for ischemia or infarction.  Continue ASA, Toprol XL, simvastatin, and lisinopril.   He has occasional atypical chest pain.  Given his reassuring myoview, I will not make any changes unless symptoms worsen.

## 2011-11-12 NOTE — Assessment & Plan Note (Signed)
BP is under good control on current meds.

## 2011-11-12 NOTE — Patient Instructions (Signed)
Schedule an appointment for an MRA of your chest in May 2013. Take Valium 5mg  about an hour before the chest  MRA. You have the prescription.  Your physician wants you to follow-up in: 6 months with Dr Shirlee Latch. (May 2013) a few days after you have the chest MRA . You will receive a reminder letter in the mail two months in advance. If you don't receive a letter, please call our office to schedule the follow-up appointment.

## 2011-11-12 NOTE — Assessment & Plan Note (Signed)
This seems improved.  He has lost weight, which may have helped.   He is not volume overloaded on exam.  He is using CPAP for his OSA.  Continue efforts at weight loss.

## 2011-11-12 NOTE — Assessment & Plan Note (Signed)
LDL was close to goal (<70) when last checked.  Continue current statin dose.

## 2011-11-12 NOTE — Progress Notes (Signed)
PCP: Dr. Tiburcio Pea   This is a 70 yo with complex history including CAD s/p CABG, paroxysmal atrial fibrillation, PVCs, chronic dyspnea, aortic root aneurysm, and diastolic CHF who presents for cardiology followup. Earlier this year, he developed significant exertional dyspnea and palpitations. Event monitor showed only PVCs. He had a syncopal episode during the monitoring period, but no arrhythmias on telemetry during the event. It was thought to be vasovagal (occurred while he was sitting on the toilet). No further syncope/presyncope.  He has had a hard time tolerating beta blockers, but Toprol XL seems to cause the least side effects. Palpitations mostly resolved on Toprol XL.   Given the exertional dyspnea earlier this year, he had a Lexiscan myoview in 4/12 that showed EF 55% and no ischemia or infarction.  MR angiogram of the chest showed stable aortic root dilation at 4.5 cm.    Patient had atrial fibrillation with RVR in 1/11 converted back to NSR spontaneously. Patient was on Pradaxa for a while but stopped it. He has had no documented atrial fibrillation recurrence.   The exertional dyspnea that [name] had earlier this year seems to have mostly resolved.  He has lost 10 lbs due to dieting and trying to exercise more.  No further syncope or presyncope.  He has had 6-8 months of atypical chest pain episodes.  He gets mild, lower central chest nonexertional chest discomfort.  He will have an episode every 1-2 weeks lasting 5-10 minutes.  BP has been under good control recently.    Labs (2/12): K 4.2, creatinine 1.07  Labs (5/12): HCT 44.6, TSH normal, LDL 78, HDL 34, K 4.2, creatinine 1.1  PMH:  1. CAD: s/p CABG in 2007. NSTEMI 1/11 in setting of atrial fibrillation with RVR. LHC at that time showed stable disease: EF 55%, SVG-RCA with 50% distal stenosis, .sequential SVG-OM and ramus with patent OM branch and occluded ramus branch, SVG-D patent, LIMA-LAD patent.  Lexiscan myoview (5/12) with EF  55%, no ischemia or infarction.  2. Diastolic CHF: Echo (2/12) with EF 50% and inferior hypokinesis, ascending aorta dilated to 4.6 cm, moderate RV dilation and dysfunction.  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  4. RLL nodule: stable over 3 years  5. OSA on CPAP  6. Chronic RBBB  7. Paroxysmal atrial fibrillation: Only noted in 1/11. 3 week event monitor in 2/12 with no atrial fibrillation.  8. HTN  9. Hyperlipidemia  10. GERD  11. BPH  12. PE in 2007 at time of CABG.  13. Syncopal episode in 2/12 (suspect vasovagal). 3-week even monitor showed PVCs, no atrial fibrillation.   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   FH: No premature CAD   ROS: All systems reviewed and negative except as noted in HPI.   Current Outpatient Prescriptions  Medication Sig Dispense Refill  . aspirin 325 MG EC tablet Take 325 mg by mouth daily.        . fish oil-omega-3 fatty acids 1000 MG capsule Take 2 g by mouth daily.        . Garlic 100 MG TABS Take by mouth daily.       Marland Kitchen lisinopril (PRINIVIL,ZESTRIL) 5 MG tablet Take 1 tablet (5 mg total) by mouth daily.  30 tablet  6  . metoprolol (TOPROL XL) 50 MG 24 hr tablet Take 1 tablet (50 mg total) by mouth daily.  30 tablet  11  . Multiple Vitamin (MULTIVITAMIN) tablet Take 1 tablet by mouth daily.        . simvastatin (ZOCOR) 20 MG tablet 1 tablet Daily.      . Tamsulosin HCl (FLOMAX) 0.4 MG CAPS Take 0.4 mg by mouth daily.          BP 131/83  Pulse 88  Ht 5\' 11"  (1.803 m)  Wt 99.791 kg (220 lb)  BMI 30.68 kg/m2 General: NAD, obese  Neck: JVP 7 cm, no thyromegaly or thyroid nodule.  Lungs: Clear to auscultation bilaterally with normal respiratory effort.  CV: Nondisplaced PMI. Heart regular S1/S2, no S3/S4, no murmur. No edema. No carotid bruit. Normal pedal pulses.  Abdomen: Soft, nontender, no hepatosplenomegaly, no  distention.  Neurologic: Alert and oriented x 3.  Psych: Normal affect.  Extremities: No clubbing or cyanosis.

## 2011-11-12 NOTE — Assessment & Plan Note (Signed)
Aortic root dilated to 4.5 cm.  Ascending aorta mildly dilated.  Repeat MRA chest to follow in 5/13.  Need good BP control to help prevent aneurysm progression.

## 2011-11-12 NOTE — Assessment & Plan Note (Signed)
Paroxysmal atrial fibrillation with only 1 known episode in 1/11.  No atrial fibrillation on recent 3-week event monitor.  I told him that it would be safest, given his risk factor profile, to be anticoagulated with Pradaxa, Xarelto, or coumadin.  He declines anticoagulation.  He will take ASA 325 mg daily.  If he has another atrial fibrillation episode, I will again strongly encourage anticoagulation.

## 2011-11-22 ENCOUNTER — Telehealth: Payer: Self-pay | Admitting: Cardiology

## 2011-11-22 DIAGNOSIS — R0602 Shortness of breath: Secondary | ICD-10-CM

## 2011-11-22 NOTE — Telephone Encounter (Signed)
Per Dr Daleen Squibb, when pt calls back, get further information.  He needs to weigh every morning and write it down, if not allergic, can start furosemide 20mg  qam, eat a potassium rich diet, get a bmet in one week and rtc in a week to see Dr Shirlee Latch.

## 2011-11-22 NOTE — Telephone Encounter (Signed)
New message:  Wife called and states patient is having increased weight gain.  Afraid that it might be fluid. He is having some shortness of breath.  Not sure if ankles are swollen.  Please call and advise.  He would like to be seen if possible.

## 2011-11-22 NOTE — Telephone Encounter (Signed)
Pt to call with more information when he returns home per Dr Daleen Squibb.  Mrs Enrico understands.

## 2011-11-22 NOTE — Telephone Encounter (Signed)
[name] called requesting that [name] be seen tomorrow.  He is complaining of a weight gain the past week "even though he is eating healthy".  She states he is concerned that he may be retaining fluid.  She thinks he is up 5# over the past week.  His sob is the same as it has been.  She does not know if he is having any swelling in his feet or ankles or anywhere else.

## 2011-11-26 MED ORDER — FUROSEMIDE 20 MG PO TABS: 20.0000 mg | ORAL_TABLET | Freq: Every day | ORAL | Status: DC

## 2011-11-26 NOTE — Telephone Encounter (Signed)
I talked with pt's wife. Pt states he continues to be SOB and thinks his weight is up about 7 pounds in the last week. He denies edema today. He would like to start lasix 20mg  daily. Pt will start lasix 20mg  daily. I discussed eating foods high in K, pt  weighing daily and recording the results  with pt's wife. I have made pt an appt 12/06/11 with Tereso Newcomer. Pt's wife is aware pt needs a BMET at time of appt 12/06/11. Pt's wife also knows to bring a log of pt's weight to appt 12/06/11

## 2011-11-30 ENCOUNTER — Other Ambulatory Visit: Payer: Self-pay | Admitting: Cardiology

## 2011-12-06 ENCOUNTER — Other Ambulatory Visit (INDEPENDENT_AMBULATORY_CARE_PROVIDER_SITE_OTHER): Payer: Medicare Other | Admitting: *Deleted

## 2011-12-06 ENCOUNTER — Other Ambulatory Visit: Payer: Self-pay

## 2011-12-06 ENCOUNTER — Encounter: Payer: Self-pay | Admitting: Physician Assistant

## 2011-12-06 ENCOUNTER — Ambulatory Visit (INDEPENDENT_AMBULATORY_CARE_PROVIDER_SITE_OTHER): Payer: Medicare Other | Admitting: Physician Assistant

## 2011-12-06 DIAGNOSIS — R5381 Other malaise: Secondary | ICD-10-CM

## 2011-12-06 DIAGNOSIS — I5032 Chronic diastolic (congestive) heart failure: Secondary | ICD-10-CM

## 2011-12-06 DIAGNOSIS — R0602 Shortness of breath: Secondary | ICD-10-CM

## 2011-12-06 DIAGNOSIS — G4733 Obstructive sleep apnea (adult) (pediatric): Secondary | ICD-10-CM

## 2011-12-06 DIAGNOSIS — I25810 Atherosclerosis of coronary artery bypass graft(s) without angina pectoris: Secondary | ICD-10-CM

## 2011-12-06 DIAGNOSIS — R0609 Other forms of dyspnea: Secondary | ICD-10-CM

## 2011-12-06 DIAGNOSIS — R0600 Dyspnea, unspecified: Secondary | ICD-10-CM

## 2011-12-06 DIAGNOSIS — I10 Essential (primary) hypertension: Secondary | ICD-10-CM

## 2011-12-06 DIAGNOSIS — I4891 Unspecified atrial fibrillation: Secondary | ICD-10-CM

## 2011-12-06 DIAGNOSIS — I509 Heart failure, unspecified: Secondary | ICD-10-CM

## 2011-12-06 DIAGNOSIS — R5383 Other fatigue: Secondary | ICD-10-CM

## 2011-12-06 DIAGNOSIS — I2510 Atherosclerotic heart disease of native coronary artery without angina pectoris: Secondary | ICD-10-CM

## 2011-12-06 DIAGNOSIS — R0989 Other specified symptoms and signs involving the circulatory and respiratory systems: Secondary | ICD-10-CM

## 2011-12-06 MED ORDER — FUROSEMIDE 20 MG PO TABS: 20.0000 mg | ORAL_TABLET | ORAL | Status: DC

## 2011-12-06 MED ORDER — LISINOPRIL 5 MG PO TABS: 5.0000 mg | ORAL_TABLET | Freq: Every day | ORAL | Status: DC

## 2011-12-06 MED ORDER — METOPROLOL SUCCINATE ER 25 MG PO TB24: 25.0000 mg | ORAL_TABLET | Freq: Every day | ORAL | Status: DC

## 2011-12-06 NOTE — Assessment & Plan Note (Signed)
Lab Results  Component Value Date   TSH 2.09 04/24/2011    Check a basic metabolic panel, BNP and CBC today.  As noted, recent TSH was normal.  He is concerned that his fatigue may be related to the Toprol.  He can decrease this to one half tablet daily see if this helps his fatigue somewhat.  He had a trial of several beta blockers and Toprol was the one that seemed to affect him the least.  I would like to try to keep him on this if at all possible.  Followup in 2 weeks as noted.

## 2011-12-06 NOTE — Assessment & Plan Note (Signed)
Controlled.  

## 2011-12-06 NOTE — Assessment & Plan Note (Signed)
He is compliant with CPAP.

## 2011-12-06 NOTE — Assessment & Plan Note (Signed)
He had fatigue and shortness of breath prior to his bypass.  Those symptoms were much worse.  He had a normal Myoview recently.  Followup in 2 weeks as noted.  If he continues to have the same symptoms with no significant evidence of volume overload, we may need to consider right and left heart catheterization.  I will bring him back on a day that Dr. Jearld Pies is in the office should we need to discuss that option.

## 2011-12-06 NOTE — Progress Notes (Signed)
[address] [place], [place]  [address] Phone: [phone] Fax:  [phone]  Date:  12/06/2011   Name:  [name]       DOB:  [birth year] MRN:  [id number]  PCP:  Dr. Tiburcio Pea Primary Cardiologist:  Dr. Marca Ancona  Primary Electrophysiologist:  None    History of Present Illness: [name] is a 70 y.o. male with complex history including CAD, s/p CABG, paroxysmal atrial fibrillation, PVCs, chronic dyspnea, aortic root aneurysm, and diastolic CHF who presents for cardiology followup.  Earlier this year, he developed significant exertional dyspnea and palpitations. Event monitor showed only PVCs. He had a syncopal episode during the monitoring period, but no arrhythmias on telemetry during the event. It was thought to be vasovagal (occurred while he was sitting on the toilet). No further syncope/presyncope.  He has had a hard time tolerating beta blockers, but Toprol XL seems to cause the least side effects. Palpitations mostly resolved on Toprol XL.   Given the exertional dyspnea earlier this year, he had a Lexiscan myoview in 4/12 that showed EF 55% and no ischemia or infarction.  MR angiogram of the chest showed stable aortic root dilation at 4.5 cm.    Patient had atrial fibrillation with RVR in 1/11 converted back to NSR spontaneously. Patient was on Pradaxa for a while but stopped it. He has had no documented atrial fibrillation recurrence.   He last saw Dr. Shirlee Latch 11/26.  At that time, repeat MRA was planned 5/13.  Anticoagulation was discussed with the patient, but he opted for aspirin.  Plan was to followup in 6 months.  The patient called in recently with increased weight and shortness of breath.  He has had a steady weight increase over the last six months.  He also feels fatigued.  Is not clear exactly how much weight he has gained.  Our scales have him up 15 pounds.  However, he states he weighed 224 pounds at home today which is only a 4 pound  difference since last visit.  He denies orthopnea, PND or edema.  He describes class II-IIb symptoms.  He denies chest pain, syncope, palpitations.  He does note episodic dyspnea.  This may last 2-3 minutes.  It sounds as though this typically occurs after some type of activity, even if it is minimal.  He notes swelling mainly in his hands.  He denies any pedal edema.  He was told to start on Lasix.  He has taken this sporadically with some improvement in his symptoms on the day that he takes it.  Labs (2/12): K 4.2, creatinine 1.07  Labs (5/12): HCT 44.6, TSH normal, LDL 78, HDL 34, K 4.2, creatinine 1.1  Past Medical History: 1. CAD: s/p CABG in 2007. NSTEMI 1/11 in setting of atrial fibrillation with RVR. LHC at that time showed stable disease: EF 55%, SVG-RCA with 50% distal stenosis, .sequential SVG-OM and ramus with patent OM branch and occluded ramus branch, SVG-D patent, LIMA-LAD patent.  Lexiscan myoview (5/12) with EF 55%, no ischemia or infarction.  2. Diastolic CHF: Echo (2/12) with EF 50% and inferior hypokinesis, ascending aorta dilated to 4.6 cm, moderate RV dilation and dysfunction.  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  4. RLL nodule: stable over 3 years  5. OSA on CPAP  6. Chronic RBBB  7. Paroxysmal atrial fibrillation: Only noted in 1/11.  3 week event monitor in 2/12 with no atrial fibrillation.  8. HTN  9. Hyperlipidemia  10. GERD  11. BPH  12. PE in 2007 at time of CABG.  13. Syncopal episode in 2/12 (suspect vasovagal). 3-week even monitor showed PVCs, no atrial fibrillation.    Current Outpatient Prescriptions  Medication Sig Dispense Refill  . aspirin 325 MG EC tablet Take 325 mg by mouth daily.        . diazepam (VALIUM) 5 MG tablet Take one tablet 1 hour before MRA  1 tablet  0  . fish oil-omega-3 fatty acids 1000 MG capsule Take 2 g by mouth daily.         . furosemide (LASIX) 20 MG tablet Take 20 mg by mouth as needed.        . Garlic 100 MG TABS Take by mouth daily.       Marland Kitchen lisinopril (PRINIVIL,ZESTRIL) 5 MG tablet TAKE ONE TABLET BY MOUTH EVERY DAY  30 tablet  6  . metoprolol (TOPROL XL) 50 MG 24 hr tablet Take 1 tablet (50 mg total) by mouth daily.  30 tablet  11  . Multiple Vitamin (MULTIVITAMIN) tablet Take 1 tablet by mouth daily.        . simvastatin (ZOCOR) 20 MG tablet 1 tablet Daily.      . Tamsulosin HCl (FLOMAX) 0.4 MG CAPS Take 0.4 mg by mouth daily.          Allergies: No Known Allergies  History  Substance Use Topics  . Smoking status: Former Smoker -- 1.0 packs/day for 45 years    Types: Cigarettes    Quit date: 12/18/1995  . Smokeless tobacco: Not on file  . Alcohol Use: No     quit in 1980     ROS:  Please see the history of present illness.   He denies fevers, cough, melena, hematochezia, diarrhea, vomiting.  All other systems reviewed and negative.   PHYSICAL EXAM: VS:  BP 118/68  Pulse 83  Ht 5\' 11"  (1.803 m)  Wt 235 lb (106.595 kg)  BMI 32.78 kg/m2 Well nourished, well developed, in no acute distress HEENT: normal Neck: JVP 5 cm Cardiac:  normal S1, S2; RRR; no murmur; No gallop Lungs:  clear to auscultation bilaterally, no wheezing, rhonchi or rales Abd: soft, nontender, no hepatomegaly Ext: no edema Skin: warm and dry Neuro:  CNs 2-12 intact, no focal abnormalities noted  EKG:  Sinus rhythm, heart rate 83, normal axis, right bundle branch block, no change from prior  ASSESSMENT AND PLAN:

## 2011-12-06 NOTE — Assessment & Plan Note (Signed)
His neck veins may be up minimally on exam.  In any event, his symptoms, overall sound consistent with some volume overload.  He does have a history of diastolic heart failure.  I think he needs Lasix on a regular basis.  I think he should do well with taking Lasix 20 mg every other day.  He will adjust his dietary potassium.  Check a basic metabolic panel and a BNP today.  Repeat a basic metabolic panel in one week.  Followup with me or Dr. Shirlee Latch in 2 weeks.

## 2011-12-06 NOTE — Patient Instructions (Addendum)
Your physician recommends that you schedule a follow-up appointment in: 2 weeks with Tereso Newcomer, PA-C Your physician recommends that you return for lab work in: today for BMP, BNP, CBC and in 1 week for repeat BMP Your physician has recommended you make the following change in your medication: DECREASE Toprol to 25 mg daily and CHANGE -- Furosemide to 20 mg every other day

## 2011-12-07 ENCOUNTER — Other Ambulatory Visit: Payer: Self-pay | Admitting: Physician Assistant

## 2011-12-07 LAB — BASIC METABOLIC PANEL
CO2: 26 mEq/L (ref 19–32)
Calcium: 8.9 mg/dL (ref 8.4–10.5)
Chloride: 106 mEq/L (ref 96–112)
Creatinine, Ser: 1.2 mg/dL (ref 0.4–1.5)
Glucose, Bld: 114 mg/dL — ABNORMAL HIGH (ref 70–99)

## 2011-12-07 NOTE — Progress Notes (Signed)
Addended by: Reine Just on: 12/07/2011 11:36 AM   Modules accepted: Orders

## 2011-12-08 LAB — BRAIN NATRIURETIC PEPTIDE: Brain Natriuretic Peptide: 50.6 pg/mL (ref 0.0–100.0)

## 2011-12-13 ENCOUNTER — Other Ambulatory Visit: Payer: Medicare Other | Admitting: *Deleted

## 2011-12-14 LAB — BASIC METABOLIC PANEL
BUN: 17 mg/dL (ref 6–23)
Creat: 1.02 mg/dL (ref 0.50–1.35)
Glucose, Bld: 104 mg/dL — ABNORMAL HIGH (ref 70–99)
Potassium: 3.8 mEq/L (ref 3.5–5.3)

## 2011-12-25 ENCOUNTER — Ambulatory Visit (INDEPENDENT_AMBULATORY_CARE_PROVIDER_SITE_OTHER): Payer: Medicare Other | Admitting: Physician Assistant

## 2011-12-25 ENCOUNTER — Encounter: Payer: Self-pay | Admitting: Physician Assistant

## 2011-12-25 DIAGNOSIS — I5032 Chronic diastolic (congestive) heart failure: Secondary | ICD-10-CM

## 2011-12-25 DIAGNOSIS — I25810 Atherosclerosis of coronary artery bypass graft(s) without angina pectoris: Secondary | ICD-10-CM

## 2011-12-25 DIAGNOSIS — I4891 Unspecified atrial fibrillation: Secondary | ICD-10-CM

## 2011-12-25 DIAGNOSIS — G4733 Obstructive sleep apnea (adult) (pediatric): Secondary | ICD-10-CM

## 2011-12-25 DIAGNOSIS — R0602 Shortness of breath: Secondary | ICD-10-CM

## 2011-12-25 DIAGNOSIS — I2510 Atherosclerotic heart disease of native coronary artery without angina pectoris: Secondary | ICD-10-CM

## 2011-12-25 DIAGNOSIS — R0609 Other forms of dyspnea: Secondary | ICD-10-CM

## 2011-12-25 DIAGNOSIS — R0989 Other specified symptoms and signs involving the circulatory and respiratory systems: Secondary | ICD-10-CM

## 2011-12-25 DIAGNOSIS — R002 Palpitations: Secondary | ICD-10-CM

## 2011-12-25 DIAGNOSIS — I509 Heart failure, unspecified: Secondary | ICD-10-CM

## 2011-12-25 MED ORDER — METOPROLOL SUCCINATE ER 25 MG PO TB24: ORAL_TABLET | ORAL | Status: DC

## 2011-12-25 NOTE — Assessment & Plan Note (Signed)
No angina.  Continue ASA.  Follow up in 5/13.

## 2011-12-25 NOTE — Assessment & Plan Note (Signed)
Volume stable.  Neck veins flat today and dyspnea improved.  I have suggested he continue current therapy.  Follow up with Dr. Marca Ancona in 04/2012 as scheduled.

## 2011-12-25 NOTE — Assessment & Plan Note (Signed)
He has resumed CPAP therapy.

## 2011-12-25 NOTE — Assessment & Plan Note (Signed)
We discussed decreasing caffeine.  He can take Toprol 25 mg QD (extra) prn palpitations.

## 2011-12-25 NOTE — Patient Instructions (Signed)
Your physician recommends that you schedule a follow-up appointment in: May 2013 Your physician has recommended you make the following change in your medication: TAKE extra Metoprolol as needed for palpitations Please decrease the intake of caffeine.

## 2011-12-25 NOTE — Assessment & Plan Note (Signed)
Improved.  I have recommended that he increase activity and to lose 5-10 pounds.  If symptoms worsen over time, we may need to consider R/L heart cath.  Otherwise, follow up with Dr. Marca Ancona in 5/13.

## 2011-12-25 NOTE — Progress Notes (Signed)
[address] [place], [place]  [address] Phone: [phone] Fax:  [phone]  Date:  12/25/2011   Name:  [name]       DOB:  1941-09-10 MRN:  [id number]  PCP:  Dr. Tiburcio Pea Primary Cardiologist:  Dr. Marca Ancona  Primary Electrophysiologist:  None    History of Present Illness: [name] is a 71 y.o. male with complex history including CAD, s/p CABG, paroxysmal atrial fibrillation, PVCs, chronic dyspnea, aortic root aneurysm, and diastolic CHF who presents for followup.  Earlier this year, he developed significant exertional dyspnea and palpitations. Event monitor showed only PVCs. He had a syncopal episode during the monitoring period, but no arrhythmias on telemetry during the event. It was thought to be vasovagal (occurred while he was sitting on the toilet). No further syncope/presyncope.  He has had a hard time tolerating beta blockers, but Toprol XL seems to cause the least side effects. Palpitations mostly resolved on Toprol XL.   Given the exertional dyspnea earlier this year, he had a Lexiscan myoview in 4/12 that showed EF 55% and no ischemia or infarction.  MR angiogram of the chest showed stable aortic root dilation at 4.5 cm.  Patient had atrial fibrillation with RVR in 1/11 converted back to NSR spontaneously. Patient was on Pradaxa for a while but stopped it. He has had no documented atrial fibrillation recurrence.   I saw him 12/20 due to increased weight and shortness of breath.  I thought he was mildly volume overloaded and had him start on Lasix every other day.  He was also somewhat fatigued and we decided to decrease his Toprol to a half a tablet to see if this would help.  He did note fatigue and shortness of breath prior to his bypass and I brought him back today to revisit his symptoms.  He feels better.  Notes less DOE.  He is probably NYHA class2-2b.  Denies chest pain.  No edema.  No orthopnea, PND.  Had worse palps yesterday.   Feels less fatigued on Toprol 25.  Admits to heavy caffeine use.  Also, increased stress at this time of year.    Labs (2/12): K 4.2, creatinine 1.07  Labs (5/12): HCT 44.6, TSH normal, LDL 78, HDL 34, K 4.2, creatinine 1.1 Labs (12/12):  K 3.9 => 3.8; creatinine 1.2 => 1.02; BNP 50.6  Past Medical History: 1. CAD: s/p CABG in 2007. NSTEMI 1/11 in setting of atrial fibrillation with RVR. LHC at that time showed stable disease: EF 55%, SVG-RCA with 50% distal stenosis, .sequential SVG-OM and ramus with patent OM branch and occluded ramus branch, SVG-D patent, LIMA-LAD patent.  Lexiscan myoview (5/12) with EF 55%, no ischemia or infarction.  2. Diastolic CHF: Echo (2/12) with EF 50% and inferior hypokinesis, ascending aorta dilated to 4.6 cm, moderate RV dilation and dysfunction.  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  4. RLL nodule: stable over 3 years  5. OSA on CPAP  6. Chronic RBBB  7. Paroxysmal atrial fibrillation: Only noted in 1/11. 3 week event monitor in 2/12 with no atrial fibrillation.  8. HTN  9. Hyperlipidemia  10. GERD  11. BPH  12. PE in 2007 at time of CABG.  13. Syncopal episode in 2/12 (suspect vasovagal). 3-week even monitor showed PVCs, no atrial fibrillation.    Current Outpatient Prescriptions  Medication Sig Dispense  Refill  . aspirin 325 MG EC tablet Take 325 mg by mouth daily.        . diazepam (VALIUM) 5 MG tablet Take one tablet 1 hour before MRA  1 tablet  0  . fish oil-omega-3 fatty acids 1000 MG capsule Take 2 g by mouth daily.        . furosemide (LASIX) 20 MG tablet Take 1 tablet (20 mg total) by mouth every other day.  30 tablet  6  . Garlic 100 MG TABS Take by mouth daily.       Marland Kitchen lisinopril (PRINIVIL,ZESTRIL) 5 MG tablet Take 1 tablet (5 mg total) by mouth daily.  30 tablet  6  . metoprolol succinate (TOPROL XL) 25 MG 24 hr tablet Take 1 tablet  (25 mg total) by mouth daily.  30 tablet  4  . Multiple Vitamin (MULTIVITAMIN) tablet Take 1 tablet by mouth daily.        . simvastatin (ZOCOR) 20 MG tablet 1 tablet Daily.      . Tamsulosin HCl (FLOMAX) 0.4 MG CAPS Take 0.4 mg by mouth daily.          Allergies: No Known Allergies  History  Substance Use Topics  . Smoking status: Former Smoker -- 1.0 packs/day for 45 years    Types: Cigarettes    Quit date: 12/18/1995  . Smokeless tobacco: Not on file  . Alcohol Use: No     quit in 1980     PHYSICAL EXAM: VS:  BP 116/80  Pulse 83  Ht 5\' 11"  (1.803 m)  Wt 230 lb 12.8 oz (104.69 kg)  BMI 32.19 kg/m2 Well nourished, well developed, in no acute distress HEENT: normal Neck: no JVD Cardiac:  normal S1, S2; RRR; no murmur; No gallop Lungs:  clear to auscultation bilaterally, no wheezing, rhonchi or rales Abd: soft, nontender, no hepatomegaly Ext: no edema Skin: warm and dry Neuro:  CNs 2-12 intact, no focal abnormalities noted  EKG:  Sinus rhythm, heart rate 82, normal axis, right bundle branch block, no change from prior  ASSESSMENT AND PLAN:

## 2012-02-13 ENCOUNTER — Encounter: Payer: Self-pay | Admitting: Gastroenterology

## 2012-02-22 ENCOUNTER — Encounter: Payer: Self-pay | Admitting: Gastroenterology

## 2012-02-28 ENCOUNTER — Encounter: Payer: Self-pay | Admitting: *Deleted

## 2012-03-05 ENCOUNTER — Encounter: Payer: Self-pay | Admitting: *Deleted

## 2012-03-07 ENCOUNTER — Telehealth: Payer: Self-pay | Admitting: Gastroenterology

## 2012-03-07 ENCOUNTER — Encounter: Payer: Self-pay | Admitting: Gastroenterology

## 2012-03-07 ENCOUNTER — Ambulatory Visit (INDEPENDENT_AMBULATORY_CARE_PROVIDER_SITE_OTHER): Payer: Medicare Other | Admitting: Gastroenterology

## 2012-03-07 ENCOUNTER — Other Ambulatory Visit (INDEPENDENT_AMBULATORY_CARE_PROVIDER_SITE_OTHER): Payer: Medicare Other

## 2012-03-07 VITALS — BP 136/80 | HR 60 | Ht 71.0 in | Wt 234.0 lb

## 2012-03-07 DIAGNOSIS — R5381 Other malaise: Secondary | ICD-10-CM

## 2012-03-07 DIAGNOSIS — Z85048 Personal history of other malignant neoplasm of rectum, rectosigmoid junction, and anus: Secondary | ICD-10-CM

## 2012-03-07 DIAGNOSIS — G4730 Sleep apnea, unspecified: Secondary | ICD-10-CM

## 2012-03-07 DIAGNOSIS — R6889 Other general symptoms and signs: Secondary | ICD-10-CM

## 2012-03-07 DIAGNOSIS — R5383 Other fatigue: Secondary | ICD-10-CM

## 2012-03-07 DIAGNOSIS — D649 Anemia, unspecified: Secondary | ICD-10-CM

## 2012-03-07 DIAGNOSIS — K219 Gastro-esophageal reflux disease without esophagitis: Secondary | ICD-10-CM

## 2012-03-07 DIAGNOSIS — Z951 Presence of aortocoronary bypass graft: Secondary | ICD-10-CM

## 2012-03-07 LAB — VITAMIN B12: Vitamin B-12: 304 pg/mL (ref 211–911)

## 2012-03-07 LAB — CBC WITH DIFFERENTIAL/PLATELET
Basophils Absolute: 0 10*3/uL (ref 0.0–0.1)
HCT: 45.8 % (ref 39.0–52.0)
Lymphs Abs: 1.9 10*3/uL (ref 0.7–4.0)
MCV: 85.7 fl (ref 78.0–100.0)
Monocytes Absolute: 0.6 10*3/uL (ref 0.1–1.0)
Monocytes Relative: 11 % (ref 3.0–12.0)
Neutrophils Relative %: 52 % (ref 43.0–77.0)
Platelets: 170 10*3/uL (ref 150.0–400.0)
RDW: 15.5 % — ABNORMAL HIGH (ref 11.5–14.6)
WBC: 5.4 10*3/uL (ref 4.5–10.5)

## 2012-03-07 LAB — IBC PANEL
Iron: 110 ug/dL (ref 42–165)
Transferrin: 274.6 mg/dL (ref 212.0–360.0)

## 2012-03-07 LAB — FOLATE: Folate: 12.8 ng/mL (ref >5.9)

## 2012-03-07 MED ORDER — MOVIPREP 100 G PO SOLR: 1.0000 | Freq: Once | ORAL | Status: DC

## 2012-03-07 NOTE — Progress Notes (Signed)
Addended by: Marlowe Kays on: 03/07/2012 10:02 AM   Modules accepted: Orders

## 2012-03-07 NOTE — Telephone Encounter (Signed)
Patient coming in to pick up MoviPrep sample kit on Monday

## 2012-03-07 NOTE — Progress Notes (Signed)
Addended by: Marlowe Kays on: 03/07/2012 09:47 AM   Modules accepted: Orders

## 2012-03-07 NOTE — Patient Instructions (Signed)
Colonoscopy A colonoscopy is an exam to evaluate your entire colon. In this exam, your colon is cleansed. A long fiberoptic tube is inserted through your rectum and into your colon. The fiberoptic scope (endoscope) is a long bundle of enclosed and very flexible fibers. These fibers transmit light to the area examined and send images from that area to your caregiver. Discomfort is usually minimal. You may be given a drug to help you sleep (sedative) during or prior to the procedure. This exam helps to detect lumps (tumors), polyps, inflammation, and areas of bleeding. Your caregiver may also take a small piece of tissue (biopsy) that will be examined under a microscope. LET YOUR CAREGIVER KNOW ABOUT:   Allergies to food or medicine.   Medicines taken, including vitamins, herbs, eyedrops, over-the-counter medicines, and creams.   Use of steroids (by mouth or creams).   Previous problems with anesthetics or numbing medicines.   History of bleeding problems or blood clots.   Previous surgery.   Other health problems, including diabetes and kidney problems.   Possibility of pregnancy, if this applies.  BEFORE THE PROCEDURE   A clear liquid diet may be required for 2 days before the exam.   Ask your caregiver about changing or stopping your regular medications.   Liquid injections (enemas) or laxatives may be required.   A large amount of electrolyte solution may be given to you to drink over a short period of time. This solution is used to clean out your colon.   You should be present 60 minutes prior to your procedure or as directed by your caregiver.  AFTER THE PROCEDURE   If you received a sedative or pain relieving medication, you will need to arrange for someone to drive you home.   Occasionally, there is a little blood passed with the first bowel movement. Do not be concerned.  FINDING OUT THE RESULTS OF YOUR TEST Not all test results are available during your visit. If your test  results are not back during the visit, make an appointment with your caregiver to find out the results. Do not assume everything is normal if you have not heard from your caregiver or the medical facility. It is important for you to follow up on all of your test results. HOME CARE INSTRUCTIONS   It is not unusual to pass moderate amounts of gas and experience mild abdominal cramping following the procedure. This is due to air being used to inflate your colon during the exam. Walking or a warm pack on your belly (abdomen) may help.   You may resume all normal meals and activities after sedatives and medicines have worn off.   Only take over-the-counter or prescription medicines for pain, discomfort, or fever as directed by your caregiver. Do not use aspirin or blood thinners if a biopsy was taken. Consult your caregiver for medicine usage if biopsies were taken.  SEEK IMMEDIATE MEDICAL CARE IF:   You have a fever.   You pass large blood clots or fill a toilet with blood following the procedure. This may also occur 10 to 14 days following the procedure. This is more likely if a biopsy was taken.   You develop abdominal pain that keeps getting worse and cannot be relieved with medicine.  Document Released: 11/30/2000 Document Revised: 11/22/2011 Document Reviewed: 07/15/2008 [facility] Patient Information 2012 [place], [place].  Upper GI Endoscopy Upper GI endoscopy means using a flexible scope to look at the esophagus, stomach, and upper small bowel. This is  done to make a diagnosis in people with heartburn, abdominal pain, or abnormal bleeding. Sometimes an endoscope is needed to remove foreign bodies or food that become stuck in the esophagus; it can also be used to take biopsy samples. For the best results, do not eat or drink for 8 hours before having your upper endoscopy.  To perform the endoscopy, you will probably be sedated and your throat will be numbed with a special spray. The endoscope is  then slowly passed down your throat (this will not interfere with your breathing). An endoscopy exam takes 15 to 30 minutes to complete and there is no real pain. Patients rarely remember much about the procedure. The results of the test may take several days if a biopsy or other test is taken.  You may have a sore throat after an endoscopy exam. Serious complications are very rare. Stick to liquids and soft foods until your pain is better. Do not drive a car or operate any dangerous equipment for at least 24 hours after being sedated. SEEK IMMEDIATE MEDICAL CARE IF:   You have severe throat pain.   You have shortness of breath.   You have bleeding problems.   You have a fever.   You have difficulty recovering from your sedation.  Document Released: 01/10/2005 Document Revised: 11/22/2011 Document Reviewed: 12/05/2008 [facility] Patient Information 2012 [place], [place]. You will need to go to the basement today for labs

## 2012-03-07 NOTE — Progress Notes (Signed)
History of Present Illness:  This is a 71 year old Caucasian male with multiple medical problems including sleep apnea, coronary artery disease with previous bypass surgery and stenting, COPD, hypertension, and previous colon resection in 1992 for colon cancer. Last colonoscopy was 5 years ago. The patient currently denies any gastrointestinal, hepatobiliary or systemic complaints except for persistent reflux aren't daily Prilosec. I reviewed his chart and cannot find an endoscopy report. He currently denies dysphagia, melena or medication. His appetite is good and his weight is stable. Followed by Dr. Marca Ancona cardiology.  I have reviewed this patient's present history, medical and surgical past history, allergies and medications.     ROS: The remainder of the 10 point ROS is negative... he admittedly does not use his CPAP machine as suggested. He has no symptoms of angina or congestive heart failure.     Physical Exam: General well developed well nourished patient in no acute distress, appearing his stated age Eyes PERRLA, no icterus, fundoscopic exam per opthamologist Skin no lesions noted Neck supple, no adenopathy, no thyroid enlargement, no tenderness Chest clear to percussion and auscultation Heart no significant murmurs, gallops or rubs noted Abdomen no hepatosplenomegaly masses or tenderness, BS normal.  Extremities no acute joint lesions, edema, phlebitis or evidence of cellulitis. Neurologic patient oriented x 3, cranial nerves intact, no focal neurologic deficits noted. Psychological mental status normal and normal affect.  Assessment and plan: Chronic GERD, rule out Barrett's mucosa, we will schedule endoscopy and biopsies if indicated. I have renewed  his Prilosec and reviewed a reflux regime with him. He is due for followup colonoscopy for his history of colon cancer, and he also had a sister who died at age 51 of colon cancer. We will continue his aspirin, use propofol  sedation, and prepped him with a balanced electrolyte solution. Did order CBC and anemia profile today.  Encounter Diagnoses  Name Primary?  Marland Kitchen Anemia Yes  . Fatigue   . Other general symptoms

## 2012-04-21 ENCOUNTER — Encounter: Payer: Self-pay | Admitting: Gastroenterology

## 2012-04-21 ENCOUNTER — Ambulatory Visit (AMBULATORY_SURGERY_CENTER): Payer: Medicare Other | Admitting: Gastroenterology

## 2012-04-21 VITALS — BP 142/86 | HR 86 | Temp 98.6°F | Resp 16 | Ht 71.0 in | Wt 234.0 lb

## 2012-04-21 DIAGNOSIS — K219 Gastro-esophageal reflux disease without esophagitis: Secondary | ICD-10-CM

## 2012-04-21 DIAGNOSIS — K2950 Unspecified chronic gastritis without bleeding: Secondary | ICD-10-CM

## 2012-04-21 DIAGNOSIS — D649 Anemia, unspecified: Secondary | ICD-10-CM

## 2012-04-21 DIAGNOSIS — K2970 Gastritis, unspecified, without bleeding: Secondary | ICD-10-CM

## 2012-04-21 DIAGNOSIS — K2990 Gastroduodenitis, unspecified, without bleeding: Secondary | ICD-10-CM

## 2012-04-21 DIAGNOSIS — K2940 Chronic atrophic gastritis without bleeding: Secondary | ICD-10-CM

## 2012-04-21 DIAGNOSIS — R6889 Other general symptoms and signs: Secondary | ICD-10-CM

## 2012-04-21 DIAGNOSIS — R5381 Other malaise: Secondary | ICD-10-CM

## 2012-04-21 DIAGNOSIS — Z85048 Personal history of other malignant neoplasm of rectum, rectosigmoid junction, and anus: Secondary | ICD-10-CM

## 2012-04-21 DIAGNOSIS — K5730 Diverticulosis of large intestine without perforation or abscess without bleeding: Secondary | ICD-10-CM

## 2012-04-21 MED ORDER — SODIUM CHLORIDE 0.9 % IV SOLN: 500.0000 mL | INTRAVENOUS | Status: DC

## 2012-04-21 MED ORDER — PRAMOXINE-HC 1-1 % EX CREA: TOPICAL_CREAM | Freq: Three times a day (TID) | CUTANEOUS | Status: DC | PRN

## 2012-04-21 NOTE — Patient Instructions (Signed)
YOU HAD AN ENDOSCOPIC PROCEDURE TODAY AT THE [facility]: Refer to the procedure report that was given to you for any specific questions about what was found during the examination.  If the procedure report does not answer your questions, please call your gastroenterologist to clarify.  If you requested that your care partner not be given the details of your procedure findings, then the procedure report has been included in a sealed envelope for you to review at your convenience later.  YOU SHOULD EXPECT: Some feelings of bloating in the abdomen. Passage of more gas than usual.  Walking can help get rid of the air that was put into your GI tract during the procedure and reduce the bloating. If you had a lower endoscopy (such as a colonoscopy or flexible sigmoidoscopy) you may notice spotting of blood in your stool or on the toilet paper. If you underwent a bowel prep for your procedure, then you may not have a normal bowel movement for a few days.  DIET: Your first meal following the procedure should be a light meal and then it is ok to progress to your normal diet.  A half-sandwich or bowl of soup is an example of a good first meal.  Heavy or fried foods are harder to digest and may make you feel nauseous or bloated.  Likewise meals heavy in dairy and vegetables can cause extra gas to form and this can also increase the bloating.  Drink plenty of fluids but you should avoid alcoholic beverages for 24 hours.  ACTIVITY: Your care partner should take you home directly after the procedure.  You should plan to take it easy, moving slowly for the rest of the day.  You can resume normal activity the day after the procedure however you should NOT DRIVE or use heavy machinery for 24 hours (because of the sedation medicines used during the test).    SYMPTOMS TO REPORT IMMEDIATELY: A gastroenterologist can be reached at any hour.  During normal business hours, 8:30 AM to 5:00 PM Monday through Friday,  call [phone].  After hours and on weekends, please call the GI answering service at [phone] who will take a message and have the physician on call contact you.   Following lower endoscopy (colonoscopy or flexible sigmoidoscopy):  Excessive amounts of blood in the stool  Significant tenderness or worsening of abdominal pains  Swelling of the abdomen that is new, acute  Fever of 100F or higher  Following upper endoscopy (EGD)  Vomiting of blood or coffee ground material  New chest pain or pain under the shoulder blades  Painful or persistently difficult swallowing  New shortness of breath  Fever of 100F or higher  Black, tarry-looking stools  FOLLOW UP: If any biopsies were taken you will be contacted by phone or by letter within the next 1-3 weeks.  Call your gastroenterologist if you have not heard about the biopsies in 3 weeks.  Our staff will call the home number listed on your records the next business day following your procedure to check on you and address any questions or concerns that you may have at that time regarding the information given to you following your procedure. This is a courtesy call and so if there is no answer at the home number and we have not heard from you through the emergency physician on call, we will assume that you have returned to your regular daily activities without incident.  SIGNATURES/CONFIDENTIALITY: You and/or your care   partner have signed paperwork which will be entered into your electronic medical record.  These signatures attest to the fact that that the information above on your After Visit Summary has been reviewed and is understood.  Full responsibility of the confidentiality of this discharge information lies with you and/or your care-partner.  

## 2012-04-21 NOTE — Progress Notes (Signed)
Patient did not experience any of the following events: a burn prior to discharge; a fall within the facility; wrong site/side/patient/procedure/implant event; or a hospital transfer or hospital admission upon discharge from the facility. (G8907) Patient did not have preoperative order for IV antibiotic SSI prophylaxis. (G8918)  

## 2012-04-21 NOTE — Op Note (Signed)
[facility] [address] [place], [place]  [address]  COLONOSCOPY PROCEDURE REPORT  PATIENT:  Samuel, Bennett  MR#:  [id number] BIRTHDATE:  01-15-1941, 70 yrs. old  GENDER:  male ENDOSCOPIST:  Vania Rea. Jarold Motto, MD, [facility] REF. BY: PROCEDURE DATE:  04/21/2012 PROCEDURE:  Diagnostic Colonoscopy ASA CLASS:  Class II INDICATIONS:  history of colon cancer 1992 SIGMOID RESECTION FOR COLON CANCER MEDICATIONS:   propofol (Diprivan) 150 mg IV  DESCRIPTION OF PROCEDURE:   After the risks and benefits and of the procedure were explained, informed consent was obtained. Digital rectal exam was performed and revealed perianal skin tags. The LB CF-H180AL [id number] endoscope was introduced through the anus and advanced to the cecum, which was identified by both the appendix and ileocecal valve.  The quality of the prep was excellent, using MoviPrep.  The instrument was then slowly withdrawn as the colon was fully examined. <<PROCEDUREIMAGES>>  FINDINGS:  Scattered diverticula were found throughout the colon. Internal Hemorrhoids were found. LARGE GRADE 4 INTERNAL HEMORRHOIDS.SEE PICTURES !!!  This was otherwise a normal examination of the colon.   Retroflexed views in the rectum revealed no abnormalities and internal hemorrhoids.    The scope was then withdrawn from the patient and the procedure completed.  COMPLICATIONS:  None ENDOSCOPIC IMPRESSION: 1) Diverticula, scattered throughout the colon 2) Internal hemorrhoids 3) Otherwise normal examination 4) Internal hemorrhoids HX. OF PRIOR SIGMOID RESECTION. RECOMMENDATIONS: 1) Repeat Colonoscopy in 3 years. LOCAL ANALPRAM CREAM AS NEEDED.  REPEAT EXAM:  No  ______________________________ Vania Rea. Jarold Motto, MD, [facility]  CC:  n. eSIGNED:   Vania Rea.  at 04/21/2012 10:46 AM  Clyde Canterbury, [phone]

## 2012-04-21 NOTE — Op Note (Signed)
[facility] [address] [place], [place]  [address]  ENDOSCOPY PROCEDURE REPORT  PATIENT:  Samuel, Bennett  MR#:  [id number] BIRTHDATE:  October 13, 1941, 70 yrs. old  GENDER:  male  ENDOSCOPIST:  Vania Rea. Jarold Motto, MD, [facility] Referred by:  PROCEDURE DATE:  04/21/2012 PROCEDURE:  EGD with biopsy, [address] ASA CLASS:  Class II INDICATIONS:  GERD  MEDICATIONS:   There was residual sedation effect present from prior procedure., propofol (Diprivan) 70 mg TOPICAL ANESTHETIC:  DESCRIPTION OF PROCEDURE:   After the risks and benefits of the procedure were explained, informed consent was obtained.  The LB GIF-H180 [id number] endoscope was introduced through the mouth and advanced to the second portion of the duodenum.  The instrument was slowly withdrawn as the mucosa was fully examined. <<PROCEDUREIMAGES>>  Severe gastritis was found in the body and the antrum of the stomach. EROSIVE GASTRITIS BIOPSIED AND CLO BX. DONE,,  A hiatal hernia was found. 4-5 CM HH NOTED.  Esophagitis was found. GRADE 2 ESOPHAGITIS IN DISTAL ESOPHAGITIS.SEE PICTURES.  Normal duodenal folds were noted.    Retroflexed views revealed a hiatal hernia. The scope was then withdrawn from the patient and the procedure completed.  COMPLICATIONS:  None  ENDOSCOPIC IMPRESSION: 1) Severe gastritis in the body and the antrum of the stomach 2) Hiatal hernia 3) Esophagitis 4) Normal duodenal folds 5) A hiatal hernia 1.MODERATE SIZED HIATIAL HERNIA AND GRADE 2 ESOPHAGITIS. 2.EROSIVE GASTRITIS,R/O H.PYLORI RECOMMENDATIONS: 1) Await biopsy results 2) Rx CLO if positive 3) continue PPI  ______________________________ Vania Rea. Jarold Motto, MD, [facility]  CC:  Johny Blamer MD  n. Rosalie Doctor:   Vania Rea.  at 04/21/2012 10:40 AM  Clyde Canterbury, [phone]

## 2012-04-22 LAB — HELICOBACTER PYLORI SCREEN-BIOPSY: UREASE: NEGATIVE

## 2012-04-23 ENCOUNTER — Telehealth: Payer: Self-pay | Admitting: *Deleted

## 2012-04-23 NOTE — Telephone Encounter (Signed)
  Follow up Call-  Call back number 04/21/2012  Post procedure Call Back phone  # [phone]  Permission to leave phone message Yes     Patient questions:  Do you have a fever, pain , or abdominal swelling? no Pain Score  0 *  Have you tolerated food without any problems? yes  Have you been able to return to your normal activities? yes  Do you have any questions about your discharge instructions: Diet   no Medications  no Follow up visit  no  Do you have questions or concerns about your Care? no  Actions: * If pain score is 4 or above: No action needed, pain <4.

## 2012-04-24 ENCOUNTER — Other Ambulatory Visit: Payer: Self-pay | Admitting: Cardiology

## 2012-04-24 ENCOUNTER — Telehealth: Payer: Self-pay | Admitting: Cardiology

## 2012-04-24 ENCOUNTER — Encounter: Payer: Self-pay | Admitting: Cardiology

## 2012-04-24 NOTE — Telephone Encounter (Signed)
Spoke with pt. Will forward to [facility] to schedule.

## 2012-04-24 NOTE — Telephone Encounter (Signed)
[facility] was to call pt to schedule an  mra , has not heard, pls call

## 2012-04-25 ENCOUNTER — Encounter: Payer: Self-pay | Admitting: Gastroenterology

## 2012-04-25 LAB — BASIC METABOLIC PANEL
Calcium: 9.7 mg/dL (ref 8.4–10.5)
Chloride: 105 mEq/L (ref 96–112)
Creat: 1.05 mg/dL (ref 0.50–1.35)
Sodium: 139 mEq/L (ref 135–145)

## 2012-04-30 ENCOUNTER — Other Ambulatory Visit: Payer: Self-pay | Admitting: *Deleted

## 2012-04-30 DIAGNOSIS — I712 Thoracic aortic aneurysm, without rupture: Secondary | ICD-10-CM

## 2012-04-30 DIAGNOSIS — I5032 Chronic diastolic (congestive) heart failure: Secondary | ICD-10-CM

## 2012-05-01 ENCOUNTER — Ambulatory Visit (HOSPITAL_COMMUNITY)
Admission: RE | Admit: 2012-05-01 | Discharge: 2012-05-01 | Disposition: A | Discharge status: Home / Self Care | Payer: Medicare Other | Source: Ambulatory Visit | Attending: Cardiology | Admitting: Cardiology

## 2012-05-01 DIAGNOSIS — I712 Thoracic aortic aneurysm, without rupture, unspecified: Secondary | ICD-10-CM

## 2012-05-01 DIAGNOSIS — I5032 Chronic diastolic (congestive) heart failure: Secondary | ICD-10-CM

## 2012-05-01 MED ORDER — GADOBENATE DIMEGLUMINE 529 MG/ML IV SOLN
20.0000 mL | Freq: Once | INTRAVENOUS | Status: AC
  Administered 2012-05-01: 20 mL via INTRAVENOUS

## 2012-05-02 ENCOUNTER — Telehealth: Payer: Self-pay | Admitting: Cardiology

## 2012-05-02 DIAGNOSIS — I712 Thoracic aortic aneurysm, without rupture: Secondary | ICD-10-CM

## 2012-05-02 NOTE — Telephone Encounter (Signed)
Has MRA been done yet? Do not see results in computer. I am certain Dr. Shirlee Latch will call him with results when it is read. Probably makes more sense for him to have MRA and then schedule follow up with Dr. Shirlee Latch to discuss. Tereso Newcomer, PA-C  1:39 PM 05/02/2012

## 2012-05-02 NOTE — Telephone Encounter (Signed)
New Problem:    Patient's wife called in returning Anne's call form yesterday and to find out the results of his MRA scan.  Also wanted to state that he is not feeling well today and is short of breath and would like him to be seen sooner by Dr. Shirlee Latch. Also wanted to know if Valium would have something to do with his blood pressure dropping when he had the scan. Please call back.

## 2012-05-02 NOTE — Telephone Encounter (Signed)
Pt is aware of lab results. Pt has been "feeling fairly well". Denies shortness of breath,edema, or angina. Pt was out mowing the yard on the riding mower. Pt also asked about follow-up appt to discuss MRA.  Appt. Scheduled with Lorin Picket to discuss MRA results same as Dr. Shirlee Latch is in office.

## 2012-05-02 NOTE — Telephone Encounter (Signed)
Appt moved to 05/07/12 at 12:15 with Dr. Crista Curb RN

## 2012-05-02 NOTE — Telephone Encounter (Signed)
You can have him see me that day he is scheduled with Scott.

## 2012-05-07 ENCOUNTER — Ambulatory Visit: Payer: Medicare Other | Admitting: Physician Assistant

## 2012-05-07 ENCOUNTER — Ambulatory Visit (INDEPENDENT_AMBULATORY_CARE_PROVIDER_SITE_OTHER): Payer: Medicare Other | Admitting: Cardiology

## 2012-05-07 ENCOUNTER — Encounter: Payer: Self-pay | Admitting: Cardiology

## 2012-05-07 VITALS — BP 126/82 | HR 86 | Ht 71.0 in | Wt 232.0 lb

## 2012-05-07 DIAGNOSIS — R0602 Shortness of breath: Secondary | ICD-10-CM

## 2012-05-07 DIAGNOSIS — E7800 Pure hypercholesterolemia, unspecified: Secondary | ICD-10-CM

## 2012-05-07 DIAGNOSIS — I719 Aortic aneurysm of unspecified site, without rupture: Secondary | ICD-10-CM

## 2012-05-07 DIAGNOSIS — I4891 Unspecified atrial fibrillation: Secondary | ICD-10-CM

## 2012-05-07 DIAGNOSIS — I2510 Atherosclerotic heart disease of native coronary artery without angina pectoris: Secondary | ICD-10-CM

## 2012-05-07 DIAGNOSIS — E785 Hyperlipidemia, unspecified: Secondary | ICD-10-CM

## 2012-05-07 DIAGNOSIS — I10 Essential (primary) hypertension: Secondary | ICD-10-CM

## 2012-05-07 MED ORDER — COENZYME Q10 30 MG PO CAPS: 30.0000 mg | ORAL_CAPSULE | Freq: Three times a day (TID) | ORAL | Status: DC

## 2012-05-07 MED ORDER — ROSUVASTATIN CALCIUM 5 MG PO TABS: 5.0000 mg | ORAL_TABLET | Freq: Every day | ORAL | Status: DC

## 2012-05-07 NOTE — Patient Instructions (Signed)
Your physician has recommended you make the following change in your medication:  Crestor 5 mg 1 tablet each night at bedtime  Your physician recommends that you return for lab work in: 2 months at the [facility] lab  Your physician wants you to follow-up in: 6 months with Dr. Shirlee Latch. You will receive a reminder letter in the mail two months in advance. If you don't receive a letter, please call our office to schedule the follow-up appointment.

## 2012-05-08 NOTE — Progress Notes (Signed)
PCP: Dr. Tiburcio Pea   This is a 71 yo with complex history including CAD s/p CABG, paroxysmal atrial fibrillation, PVCs, chronic dyspnea, aortic root aneurysm, and diastolic CHF who presents for cardiology followup. Last Lexiscan myoview in 4/12 showed EF 55% and no ischemia or infarction.  Most recent Samuel angiogram of the chest in 5/13 showed stable aortic root dilation at 4.4 cm.    Patient had atrial fibrillation with RVR in 1/11 converted back to NSR spontaneously. Patient was on Pradaxa for a while but stopped it. He has had no documented atrial fibrillation recurrence.   [name] continues to have stable exertional dyspnea.  He notes it with walking up stairs or up a hill.  It has been stable for over a year now.  He tried taking Lasix but this did not help so he stopped it.  He has tried inhalers in the past but these did not help so he stopped them.  No chest pain. Weight is up 2 lbs compared to prior appointment. He stopped taking simvastatin because of fatigue and is feeling better now that he is off it.   Labs (2/12): K 4.2, creatinine 1.07  Labs (5/12): HCT 44.6, TSH normal, LDL 78, HDL 34, K 4.2, creatinine 1.1 Labs (12/12): BNP 56 Labs (5/13): K 3.7, creatinine 1.05  PMH:  1. CAD: s/p CABG in 2007. NSTEMI 1/11 in setting of atrial fibrillation with RVR. LHC at that time showed stable disease: EF 55%, SVG-RCA with 50% distal stenosis, .sequential SVG-OM and ramus with patent OM branch and occluded ramus branch, SVG-D patent, LIMA-LAD patent.  Lexiscan myoview (5/12) with EF 55%, no ischemia or infarction.  2. Diastolic CHF: Echo (2/12) with EF 50% and inferior hypokinesis, ascending aorta dilated to 4.6 cm, moderate RV dilation and dysfunction.  3. Aortic root aneurysm: Samuel angiogram chest (5/12) showed aortic root 4.5 cm, ascending aorta 4.1 cm, remainder of thoracic aorta normal in caliber.  MRA chest (5/13) with 4.4 cm aortic root, 4.2 cm ascending aorta.  4. RLL nodule: stable over 3  years  5. OSA on CPAP  6. Chronic RBBB  7. Paroxysmal atrial fibrillation: Only noted in 1/11. 3 week event monitor in 2/12 with no atrial fibrillation.  8. HTN  9. Hyperlipidemia: Fatigue with simvastatin.  10. GERD  11. BPH  12. PE in 2007 at time of CABG.  13. Syncopal episode in 2/12 (suspect vasovagal). 3-week even monitor showed PVCs, no atrial fibrillation.  14. 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   FH: No premature CAD   ROS: All systems reviewed and negative except as noted in HPI.   Current Outpatient Prescriptions  Medication Sig Dispense Refill  . aspirin 325 MG EC tablet Take 325 mg by mouth daily.        . clonazePAM (KLONOPIN) 0.5 MG tablet Take 0.5 mg by mouth as needed.      [name] Calcium (STOOL SOFTENER PO) Take by mouth as needed.      . fish oil-omega-3 fatty acids 1000 MG capsule Take 2 g by mouth daily.        . Garlic 100 MG TABS Take by mouth daily.       Marland Kitchen lisinopril (PRINIVIL,ZESTRIL) 5 MG tablet Take 1 tablet (5 mg total) by mouth daily.  30 tablet  6  . metoprolol succinate (TOPROL-XL) 25 MG 24 hr tablet Take 1 tablet daily. Take extra as needed for palpitations  45 tablet  4  .  Multiple Vitamin (MULTIVITAMIN) tablet Take 1 tablet by mouth daily.        . pramoxine-hydrocortisone (ANALPRAM HC) cream Apply topically 3 (three) times daily as needed. Apply to affected area 3 times daily as needed.  30 g  1  . Tamsulosin HCl (FLOMAX) 0.4 MG CAPS Take 0.4 mg by mouth daily.        Marland Kitchen co-enzyme Q-10 30 MG capsule Take 1 capsule (30 mg total) by mouth 3 (three) times daily.      . rosuvastatin (CRESTOR) 5 MG tablet Take 1 tablet (5 mg total) by mouth daily.  30 tablet  11    BP 126/82  Pulse 86  Ht 5\' 11"  (1.803 m)  Wt 232 lb (105.235 kg)  BMI 32.36 kg/m2  SpO2 96% General: NAD, obese  Neck: JVP 7 cm, no thyromegaly or thyroid nodule.  Lungs: Clear to auscultation bilaterally with normal respiratory effort.  CV:  Nondisplaced PMI. Heart regular S1/S2, no S3/S4, no murmur. No edema. No carotid bruit. Normal pedal pulses.  Abdomen: Soft, nontender, no hepatosplenomegaly, no distention.  Neurologic: Alert and oriented x 3.  Psych: Normal affect.  Extremities: No clubbing or cyanosis.

## 2012-05-08 NOTE — Assessment & Plan Note (Signed)
CAD s/p CABG.  Recent reassuring myoview.  Continue ASA, Toprol XL, and lisinopril.   He needs to restart on a statin.

## 2012-05-08 NOTE — Assessment & Plan Note (Signed)
Chronic and stable.   He is not volume overloaded on exam and trial of Lasix did not help.  Inhalers have not helped in the past.  He is using CPAP for his OSA.  Continue efforts at weight loss.  I suspect obesity and deconditioning play a role.

## 2012-05-08 NOTE — Assessment & Plan Note (Signed)
Paroxysmal atrial fibrillation with only 1 known episode in 1/11.  No atrial fibrillation on 3-week event monitor in 2/12.  In the past, I told him that it would be safest, given his risk factor profile, to be anticoagulated with Pradaxa, Xarelto, or coumadin.  He declined anticoagulation.  He is on ASA.  If he has another atrial fibrillation episode, I will again strongly encourage anticoagulation.

## 2012-05-08 NOTE — Assessment & Plan Note (Signed)
Needs to restart statin.  Had side effects with simvastatin.  Will have him start Crestor 5 mg daily with Coenzyme Q10 200 mg daily to see if he tolerates this better. Lipids/LFTs in 2 months. Goal LDL < 70.

## 2012-05-08 NOTE — Assessment & Plan Note (Signed)
BP is at goal. 

## 2012-05-08 NOTE — Assessment & Plan Note (Signed)
Stable 4.4 cm aortic root.  Repeat MRA chest in 5/14.  Continue BP control.

## 2012-05-09 ENCOUNTER — Telehealth: Payer: Self-pay | Admitting: Cardiology

## 2012-05-09 MED ORDER — PRAVASTATIN SODIUM 40 MG PO TABS: 40.0000 mg | ORAL_TABLET | Freq: Every evening | ORAL | Status: DC

## 2012-05-09 NOTE — Telephone Encounter (Signed)
Patient's wife called stated crestor too expensive.Wife was told Dr.McLean out of office today 05/09/12.Will forward to Dr.McLean for advice.

## 2012-05-09 NOTE — Telephone Encounter (Signed)
Pt said crestor too expensive can it be changes to something else, [facility] [name]

## 2012-05-09 NOTE — Telephone Encounter (Signed)
Try pravastatin 40 mg daily instead.

## 2012-05-09 NOTE — Telephone Encounter (Signed)
Patient called was told Dr.McLean advised to take pravastatin 40 mg every night.Prescription sent to [facility] in [place].

## 2012-05-27 ENCOUNTER — Telehealth: Payer: Self-pay | Admitting: Pulmonary Disease

## 2012-05-27 DIAGNOSIS — G4733 Obstructive sleep apnea (adult) (pediatric): Secondary | ICD-10-CM

## 2012-05-27 NOTE — Telephone Encounter (Signed)
lmomtcb x1 

## 2012-05-28 NOTE — Telephone Encounter (Signed)
Order has been sent - lmomtcb

## 2012-05-28 NOTE — Telephone Encounter (Signed)
Called, spoke with pt.  He is requesting order for a cushion replacement for his cpap.  Would like this to be sent to [facility] as [facility] no longer accepts his insurance.  He was last seen by Dr. Craige Cotta on 04/2011.  He did schedule a f/u in July 2013 and is aware he will need to keep this appt.  Dr. Craige Cotta, pls advise if order for cushion replacement can be sent.  Thank you.

## 2012-05-28 NOTE — Telephone Encounter (Signed)
Okay to send order. 

## 2012-05-29 ENCOUNTER — Telehealth: Payer: Self-pay | Admitting: Pulmonary Disease

## 2012-05-29 NOTE — Telephone Encounter (Signed)
lmomtcb x1 for pt--order was given to lecretia 05/28/12 to take to [facility]

## 2012-05-29 NOTE — Telephone Encounter (Signed)
Pt aware.  , CMA  

## 2012-05-30 NOTE — Telephone Encounter (Signed)
I spoke with [name] and [name] has received rx for this but pt is not a current pt and since he is transferring DME companies [name] needs everything faxed over to them. [name] will pull this from [employer] to get this straightened out. I spoke with spouse and made her aware of this and she states she will make pt aware.

## 2012-05-30 NOTE — Telephone Encounter (Signed)
Pt stated that he has went to [facility] and they told him that they didn't have an order in  For his cpap cushion. i called and Spoke with bonnie at [facility] and she said  there was no order in for Pt's cpap cushion. Order was placed on 6-12 i then  Called lecretia  And she is going to check on this And call back.

## 2012-05-30 NOTE — Telephone Encounter (Signed)
Waiting on lecrctia to call back

## 2012-05-30 NOTE — Telephone Encounter (Signed)
Pt stated that [facility] has not rec'd an order from [place] for the cushion.  Pt states it needs to be faxed to [phone] or [phone].  Antionette Fairy

## 2012-05-30 NOTE — Telephone Encounter (Signed)
Pt waiting in lobby

## 2012-06-06 ENCOUNTER — Ambulatory Visit
Admission: RE | Admit: 2012-06-06 | Discharge: 2012-06-06 | Disposition: A | Discharge status: Home / Self Care | Payer: Medicare Other | Source: Ambulatory Visit | Attending: Family Medicine | Admitting: Family Medicine

## 2012-06-06 ENCOUNTER — Other Ambulatory Visit: Payer: Self-pay | Admitting: Family Medicine

## 2012-06-06 DIAGNOSIS — M545 Low back pain: Secondary | ICD-10-CM

## 2012-06-11 ENCOUNTER — Ambulatory Visit: Payer: Medicare Other | Admitting: Cardiology

## 2012-06-13 ENCOUNTER — Telehealth: Payer: Self-pay | Admitting: Cardiology

## 2012-06-13 NOTE — Telephone Encounter (Signed)
Attempted to contact pt by telephone. Unable to leave a message.

## 2012-06-13 NOTE — Telephone Encounter (Signed)
Pt having soreness in chest yesterday and today, like indigestion, no pain, denies SOB, wants to know if he needs to be seen?

## 2012-06-13 NOTE — Telephone Encounter (Signed)
Spoke with pt's wife. Pt woke up about 3am this morning with pain that felt like indigestion. Pt states he had pain like this several years ago and was told he had indigestion. Pt states that the indigestion resolved after sitting up in the chair.

## 2012-06-13 NOTE — Telephone Encounter (Signed)
Reviewed with Dr Shirlee Latch. OK to monitor symptoms if not having anymore indigestion-like symptoms and chest soreness symptoms are related to positional changes. If any question or concerns pt should report to ED to be checked. I spoke with pt's wife and she is aware of Dr Alford Highland recommendations.

## 2012-06-25 ENCOUNTER — Encounter: Payer: Self-pay | Admitting: Pulmonary Disease

## 2012-06-25 ENCOUNTER — Ambulatory Visit (INDEPENDENT_AMBULATORY_CARE_PROVIDER_SITE_OTHER): Payer: Medicare Other | Admitting: Pulmonary Disease

## 2012-06-25 VITALS — BP 120/80 | HR 87 | Temp 98.1°F | Ht 71.0 in | Wt 234.6 lb

## 2012-06-25 DIAGNOSIS — G4733 Obstructive sleep apnea (adult) (pediatric): Secondary | ICD-10-CM

## 2012-06-25 NOTE — Assessment & Plan Note (Signed)
Explained to him the importance of maintaining compliance with CPAP therapy.  He will try to use CPAP for entire time he is asleep.

## 2012-06-25 NOTE — Progress Notes (Signed)
Chief Complaint  Patient presents with  . yearly follow up    Pt reports SOB seems to be getting "a little worse," congested cough but cant not bring any sputem to clear,     History of Present Illness: [name] is a 71 y.o. male former smoker with OSA using CPAP 12 cm H2O.  He goes to bed at 11 pm.  He falls asleep quickly.  He wakes after about 4 hours to use the bathroom, and then takes his CPAP mask off.  He does not want to bother putting it back on.  He gets out of bed at 7 am.  He feels okay in the morning, and does not need to take naps.  He uses his machine about 6 nights per week.  He has a nasal mask, and no problems with his mask.  Past Medical History  Diagnosis Date  . CAD (coronary artery disease)     s/p CABG 2007; NSTEMI in setting of AFib with RVR 12/2009; cath 1/11: S-RCA ok with prox 30-40% and 50% stenoses; S-OM and RI  with patent OM limb but an occluded RI limb; S-D2 and L-LAD ok; EF 55%  . HTN (hypertension)   . Diastolic dysfunction     Echo 1/61:  EF 55-60%; mild LVH; focal basal septal hypertrophy; grade 1 diast.dysfxn; mild BAE and RVE  . GERD (gastroesophageal reflux disease)   . Colon cancer 1992  . BPH (benign prostatic hyperplasia)   . PE (pulmonary embolism) 2007    after CABG  . Lung granuloma     Right upper  . OSA on CPAP   . Dyspnea   . Atrial fibrillation     previously on Multaq  . RBBB (right bundle branch block)   . Hyperlipidemia   . Personal history of colonic polyps 01/22/2000    TUBULAR ADENOMA  . Diverticulosis     Past Surgical History  Procedure Date  . Colectomy 1992    w/ colostomy  related to colon cancer  . Inguinal hernia repair   . Laparoscopic cholecystectomy 2004  . Coronary artery bypass graft 2007  . Ventral hernia repair 2009    Outpatient Encounter Prescriptions as of 06/25/2012  Medication Sig Dispense Refill  . aspirin 325 MG EC tablet Take 325 mg by mouth daily.        . fish oil-omega-3 fatty acids  1000 MG capsule Take 1,200 g by mouth daily.       . Garlic 100 MG TABS Take 1,000 mg by mouth daily.       Marland Kitchen lisinopril (PRINIVIL,ZESTRIL) 5 MG tablet Take 1 tablet (5 mg total) by mouth daily.  30 tablet  6  . metoprolol succinate (TOPROL-XL) 25 MG 24 hr tablet Take 1 tablet daily. Take extra as needed for palpitations  45 tablet  4  . Multiple Vitamin (MULTIVITAMIN) tablet Take 1 tablet by mouth daily.        . pravastatin (PRAVACHOL) 40 MG tablet Take 1 tablet (40 mg total) by mouth every evening.  30 tablet  11  . Tamsulosin HCl (FLOMAX) 0.4 MG CAPS Take 0.4 mg by mouth daily.        . clonazePAM (KLONOPIN) 0.5 MG tablet Take 0.5 mg by mouth as needed.      Marland Kitchen co-enzyme Q-10 30 MG capsule Take 1 capsule (30 mg total) by mouth 3 (three) times daily.      Tery Sanfilippo Calcium (STOOL SOFTENER PO) Take by mouth as  needed.      Marland Kitchen DISCONTD: pramoxine-hydrocortisone (ANALPRAM HC) cream Apply topically 3 (three) times daily as needed. Apply to affected area 3 times daily as needed.  30 g  1    No Known Allergies  Physical Exam:  Blood pressure 120/80, pulse 87, temperature 98.1 F (36.7 C), temperature source Oral, height 5\' 11"  (1.803 m), weight 234 lb 9.6 oz (106.414 kg), SpO2 93.00%. Body mass index is 32.72 kg/(m^2). Wt Readings from Last 2 Encounters:  06/25/12 234 lb 9.6 oz (106.414 kg)  05/07/12 232 lb (105.235 kg)    General - Obese, no distress  HEENT - no sinus tenderness, no oral exudate, no LAN  Cardiac - s1s2 regular, no murmur  Chest - CTA  Abd - soft, nontender  Ext - no edema  Neuro - normal strength, CN intact  Psych - normal behavior/mood    Assessment/Plan:  Coralyn Helling, MD [facility] Pager:  [phone] 06/25/2012, 5:11 PM

## 2012-06-25 NOTE — Patient Instructions (Signed)
Follow-up in one year.

## 2012-07-01 ENCOUNTER — Encounter (INDEPENDENT_AMBULATORY_CARE_PROVIDER_SITE_OTHER): Payer: Self-pay | Admitting: General Surgery

## 2012-07-04 ENCOUNTER — Encounter (INDEPENDENT_AMBULATORY_CARE_PROVIDER_SITE_OTHER): Payer: Self-pay | Admitting: General Surgery

## 2012-08-29 ENCOUNTER — Telehealth: Payer: Self-pay | Admitting: Cardiology

## 2012-08-29 NOTE — Telephone Encounter (Signed)
Pt's wife made fu appt for November, does he blood work? If so, he would like it faxed to [facility] lab in [place], pls call to let her know @ [phone]

## 2012-08-29 NOTE — Telephone Encounter (Signed)
Spoke with pt's wife. Pt has not had lab since May 2013. I will mail pt an order for a fasting lipid profile/liver profile/BMET to be done in the next week or so.

## 2012-10-13 ENCOUNTER — Other Ambulatory Visit: Payer: Self-pay | Admitting: Cardiology

## 2012-10-13 ENCOUNTER — Encounter: Payer: Self-pay | Admitting: Cardiology

## 2012-10-13 LAB — HEPATIC FUNCTION PANEL
Alkaline Phosphatase: 47 U/L (ref 39–117)
Bilirubin, Direct: 0.1 mg/dL (ref 0.0–0.3)
Indirect Bilirubin: 0.4 mg/dL (ref 0.0–0.9)
Total Bilirubin: 0.5 mg/dL (ref 0.3–1.2)

## 2012-10-13 LAB — LIPID PANEL: LDL Cholesterol: 98 mg/dL (ref 0–99)

## 2012-10-13 LAB — BASIC METABOLIC PANEL
BUN: 13 mg/dL (ref 6–23)
CO2: 26 mEq/L (ref 19–32)
Chloride: 105 mEq/L (ref 96–112)
Creat: 1.15 mg/dL (ref 0.50–1.35)
Glucose, Bld: 95 mg/dL (ref 70–99)

## 2012-10-21 ENCOUNTER — Ambulatory Visit (INDEPENDENT_AMBULATORY_CARE_PROVIDER_SITE_OTHER): Payer: Medicare Other | Admitting: Cardiology

## 2012-10-21 ENCOUNTER — Encounter: Payer: Self-pay | Admitting: Cardiology

## 2012-10-21 VITALS — BP 136/84 | HR 84 | Ht 71.0 in | Wt 236.0 lb

## 2012-10-21 DIAGNOSIS — I2510 Atherosclerotic heart disease of native coronary artery without angina pectoris: Secondary | ICD-10-CM

## 2012-10-21 DIAGNOSIS — R0989 Other specified symptoms and signs involving the circulatory and respiratory systems: Secondary | ICD-10-CM

## 2012-10-21 DIAGNOSIS — R0602 Shortness of breath: Secondary | ICD-10-CM

## 2012-10-21 DIAGNOSIS — I25810 Atherosclerosis of coronary artery bypass graft(s) without angina pectoris: Secondary | ICD-10-CM

## 2012-10-21 DIAGNOSIS — I10 Essential (primary) hypertension: Secondary | ICD-10-CM

## 2012-10-21 DIAGNOSIS — R0609 Other forms of dyspnea: Secondary | ICD-10-CM

## 2012-10-21 DIAGNOSIS — E785 Hyperlipidemia, unspecified: Secondary | ICD-10-CM

## 2012-10-21 DIAGNOSIS — I719 Aortic aneurysm of unspecified site, without rupture: Secondary | ICD-10-CM

## 2012-10-21 DIAGNOSIS — I4891 Unspecified atrial fibrillation: Secondary | ICD-10-CM

## 2012-10-21 MED ORDER — METOPROLOL SUCCINATE ER 25 MG PO TB24: ORAL_TABLET | ORAL | Status: DC

## 2012-10-21 MED ORDER — PRAVASTATIN SODIUM 80 MG PO TABS: 80.0000 mg | ORAL_TABLET | Freq: Every evening | ORAL | Status: DC

## 2012-10-21 NOTE — Patient Instructions (Addendum)
Increase pravachol to 80mg  daily in the evening.   Your physician recommends that you have a FASTING lipid profile /liver profile in 2 months. I have given you an order for that today. Please fax the results to Dr Shirlee Latch [phone].  Schedule an appointment for an MRA of your chest to be done in May 2014.   Your physician wants you to follow-up in: May 2014 with Dr Shirlee Latch a few days after the MRA is done.  You will receive a reminder letter in the mail two months in advance. If you don't receive a letter, please call our office to schedule the follow-up appointment.

## 2012-10-21 NOTE — Progress Notes (Signed)
Patient ID: MALIQUE DRISKILL, male   DOB: 01/07/1941, 71 y.o.   MRN: [id number] PCP: Dr. Tiburcio Pea   This is a 71 yo with complex history including CAD s/p CABG, paroxysmal atrial fibrillation, PVCs, chronic dyspnea, aortic root aneurysm, and diastolic CHF who presents for cardiology followup. Last Lexiscan myoview in 4/12 showed EF 55% and no ischemia or infarction.  Most recent MR angiogram of the chest in 5/13 showed stable aortic root dilation at 4.4 cm.    Patient had atrial fibrillation with RVR in 1/11 converted back to NSR spontaneously. Patient was on Pradaxa for a while but stopped it. He has had no documented atrial fibrillation recurrence.   Mr Arizpe continues to have stable exertional dyspnea.  He notes it with walking up stairs or up a hill.  It has been stable for a couple of years.  He is able to do yardwork without much problem.  He tried taking Lasix but this did not help so he stopped it.  He has tried inhalers in the past but these did not help so he stopped them.  No chest pain. Weight is up 4 lbs compared to prior appointment. He is tolerating pravastatin.  He cannot tolerate CPAP.   Labs (2/12): K 4.2, creatinine 1.07  Labs (5/12): HCT 44.6, TSH normal, LDL 78, HDL 34, K 4.2, creatinine 1.1 Labs (12/12): BNP 56 Labs (5/13): K 3.7, creatinine 1.05 Labs (10/13) ;K 4.6, creatinine 1.15, LDL 98, HDL 33  ECG: NSR, RBBB  PMH:  1. CAD: s/p CABG in 2007. NSTEMI 1/11 in setting of atrial fibrillation with RVR. LHC at that time showed stable disease: EF 55%, SVG-RCA with 50% distal stenosis, .sequential SVG-OM and ramus with patent OM branch and occluded ramus branch, SVG-D patent, LIMA-LAD patent.  Lexiscan myoview (5/12) with EF 55%, no ischemia or infarction.  2. Diastolic CHF: Echo (2/12) with EF 50% and inferior hypokinesis, ascending aorta dilated to 4.6 cm, moderate RV dilation and dysfunction.  3. Aortic root aneurysm: MR angiogram chest (5/12) showed aortic root 4.5 cm, ascending  aorta 4.1 cm, remainder of thoracic aorta normal in caliber.  MRA chest (5/13) with 4.4 cm aortic root, 4.2 cm ascending aorta.  4. RLL nodule: stable over 3 years  5. OSA on CPAP  6. Chronic RBBB  7. Paroxysmal atrial fibrillation: Only noted in 1/11. 3 week event monitor in 2/12 with no atrial fibrillation.  8. HTN  9. Hyperlipidemia: Fatigue with simvastatin.  10. GERD  11. BPH  12. PE in 2007 at time of CABG.  13. Syncopal episode in 2/12 (suspect vasovagal). 3-week even monitor showed PVCs, no atrial fibrillation.  14. 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   FH: No premature CAD   ROS: All systems reviewed and negative except as noted in HPI.   Current Outpatient Prescriptions  Medication Sig Dispense Refill  . aspirin 325 MG EC tablet Take 325 mg by mouth daily.        . clonazePAM (KLONOPIN) 0.5 MG tablet Take 0.5 mg by mouth as needed.      Marland Kitchen co-enzyme Q-10 30 MG capsule Take 1 capsule (30 mg total) by mouth 3 (three) times daily.      Tery Sanfilippo Calcium (STOOL SOFTENER PO) Take by mouth as needed.      . fish oil-omega-3 fatty acids 1000 MG capsule Take 1,200 g by mouth daily.       . Garlic 100 MG TABS Take 1,000  mg by mouth daily.       Marland Kitchen lisinopril (PRINIVIL,ZESTRIL) 5 MG tablet Take 1 tablet (5 mg total) by mouth daily.  30 tablet  6  . metoprolol succinate (TOPROL-XL) 25 MG 24 hr tablet Take 1 tablet daily. Take extra as needed for palpitations  45 tablet  11  . Multiple Vitamin (MULTIVITAMIN) tablet Take 1 tablet by mouth daily.        . Tamsulosin HCl (FLOMAX) 0.4 MG CAPS Take 0.4 mg by mouth daily.        . [DISCONTINUED] metoprolol succinate (TOPROL-XL) 25 MG 24 hr tablet Take 1 tablet daily. Take extra as needed for palpitations  45 tablet  4  . [DISCONTINUED] pravastatin (PRAVACHOL) 40 MG tablet Take 1 tablet (40 mg total) by mouth every evening.  30 tablet  11  . pravastatin (PRAVACHOL) 80 MG tablet Take 1 tablet (80 mg total) by  mouth every evening.  30 tablet  3    BP 136/84  Pulse 84  Ht 5\' 11"  (1.803 m)  Wt 236 lb (107.049 kg)  BMI 32.92 kg/m2 General: NAD, obese  Neck: JVP 7 cm, no thyromegaly or thyroid nodule.  Lungs: Clear to auscultation bilaterally with normal respiratory effort.  CV: Nondisplaced PMI. Heart regular S1/S2, no S3/S4, no murmur. No edema. No carotid bruit. Normal pedal pulses.  Abdomen: Soft, nontender, no hepatosplenomegaly, no distention.  Neurologic: Alert and oriented x 3.  Psych: Normal affect.  Extremities: No clubbing or cyanosis.   Assessment/Plan:  Aortic aneurysm  Stable 4.4 cm aortic root. Repeat MRA chest in 5/14. Continue BP control.  ATRIAL FIBRILLATION  Paroxysmal atrial fibrillation with only 1 known episode in 1/11. No atrial fibrillation on 3-week event monitor in 2/12. In the past, I told him that it would be safest, given his risk factor profile, to be anticoagulated with apixaban, Xarelto, or coumadin. He declined anticoagulation. He is on ASA. If he has another atrial fibrillation episode, I will again strongly encourage anticoagulation.  CAD  CAD s/p CABG. Recent reassuring myoview. Continue ASA, Toprol XL, and lisinopril. He he has tolerated pravastatin.  DYSPNEA Chronic and stable. He is not volume overloaded on exam and trial of Lasix did not help. Inhalers have not helped in the past. He cannot tolerate CPAP for his OSA. Continue efforts at weight loss. I suspect obesity and deconditioning play a role.  I asked him to try to walk for 20-30 minutes daily.  HYPERLIPIDEMIA He has tolerated pravastatin without problems.  I will have him increase pravastatin to 80 mg daily with goal LDL < 70 (still above goal when last checked).  Lipids/LFTs in 2 months.  HYPERTENSION  BP is at goal.   Marca Ancona 10/21/2012 1:55 PM

## 2012-12-12 ENCOUNTER — Other Ambulatory Visit: Payer: Self-pay | Admitting: *Deleted

## 2012-12-18 ENCOUNTER — Other Ambulatory Visit: Payer: Self-pay | Admitting: *Deleted

## 2012-12-18 MED ORDER — LISINOPRIL 5 MG PO TABS: 5.0000 mg | ORAL_TABLET | Freq: Every day | ORAL | Status: DC

## 2013-03-15 ENCOUNTER — Other Ambulatory Visit: Payer: Self-pay | Admitting: Cardiology

## 2013-03-24 ENCOUNTER — Encounter: Payer: Self-pay | Admitting: Cardiology

## 2013-05-13 ENCOUNTER — Telehealth: Payer: Self-pay | Admitting: Cardiology

## 2013-05-13 ENCOUNTER — Telehealth: Payer: Self-pay

## 2013-05-13 DIAGNOSIS — I719 Aortic aneurysm of unspecified site, without rupture: Secondary | ICD-10-CM

## 2013-05-13 NOTE — Telephone Encounter (Signed)
Returned call to patient spoke to wife she stated it is time to schedule patient's MRA chest and also need to schedule appointment with Dr.McLean.Schedulers will call to schedule appointments.

## 2013-05-13 NOTE — Telephone Encounter (Signed)
New Prob     Calling in wanting to schedule an MRA. Please call.

## 2013-05-13 NOTE — Telephone Encounter (Signed)
Received call from [facility] need order for patient to have bmet 05/14/13 before he has MRA chest.

## 2013-05-14 ENCOUNTER — Ambulatory Visit (HOSPITAL_COMMUNITY)
Admission: RE | Admit: 2013-05-14 | Discharge: 2013-05-14 | Disposition: A | Discharge status: Home / Self Care | Payer: PRIVATE HEALTH INSURANCE | Source: Ambulatory Visit | Attending: Cardiology | Admitting: Cardiology

## 2013-05-14 DIAGNOSIS — I719 Aortic aneurysm of unspecified site, without rupture: Secondary | ICD-10-CM

## 2013-05-14 DIAGNOSIS — I77819 Aortic ectasia, unspecified site: Secondary | ICD-10-CM | POA: Insufficient documentation

## 2013-05-14 LAB — CREATININE, SERUM
GFR calc Af Amer: 78 mL/min — ABNORMAL LOW (ref >90)
GFR calc non Af Amer: 67 mL/min — ABNORMAL LOW (ref >90)

## 2013-05-14 MED ORDER — GADOBENATE DIMEGLUMINE 529 MG/ML IV SOLN
20.0000 mL | Freq: Once | INTRAVENOUS | Status: AC
  Administered 2013-05-14: 20 mL via INTRAVENOUS

## 2013-05-15 ENCOUNTER — Telehealth: Payer: Self-pay | Admitting: *Deleted

## 2013-05-15 NOTE — Telephone Encounter (Signed)
I called pt to give him the results of the MRA of his chest. He states he got a reminder letter in the mail. He was due to see Dr Shirlee Latch in May 2014. He was offered the first available appt with Dr Shirlee Latch in August. Pt is not happy about this. I asked him to go ahead and schedule the appt with Dr Shirlee Latch in August but he declined. Pt states he has been having some SOB. I offered to schedule an appt with PA/NP next week. He declined. I told him I would forward to my manager. He states he would like for me to do this.

## 2013-06-04 ENCOUNTER — Encounter: Payer: Self-pay | Admitting: Cardiology

## 2013-06-04 ENCOUNTER — Ambulatory Visit (INDEPENDENT_AMBULATORY_CARE_PROVIDER_SITE_OTHER): Payer: PRIVATE HEALTH INSURANCE | Admitting: Cardiology

## 2013-06-04 VITALS — BP 145/90 | HR 81 | Ht 71.0 in | Wt 238.0 lb

## 2013-06-04 DIAGNOSIS — Z951 Presence of aortocoronary bypass graft: Secondary | ICD-10-CM

## 2013-06-04 DIAGNOSIS — E785 Hyperlipidemia, unspecified: Secondary | ICD-10-CM

## 2013-06-04 DIAGNOSIS — I10 Essential (primary) hypertension: Secondary | ICD-10-CM

## 2013-06-04 DIAGNOSIS — I4891 Unspecified atrial fibrillation: Secondary | ICD-10-CM

## 2013-06-04 DIAGNOSIS — I5032 Chronic diastolic (congestive) heart failure: Secondary | ICD-10-CM

## 2013-06-04 DIAGNOSIS — Z79899 Other long term (current) drug therapy: Secondary | ICD-10-CM

## 2013-06-04 NOTE — Patient Instructions (Signed)
   Labs for BMET, FLP, LFT Office will contact with results via phone or letter.   Call office in 2 weeks (around July 3) to report blood pressure readings Continue all current medications. Your physician wants you to follow up in: 6 months.  You will receive a reminder letter in the mail one-two months in advance.  If you don't receive a letter, please call our office to schedule the follow up appointment

## 2013-06-05 ENCOUNTER — Telehealth: Payer: Self-pay | Admitting: Pulmonary Disease

## 2013-06-05 NOTE — Telephone Encounter (Signed)
Called patient and left message x3. Sent letter 06/04/13.  °

## 2013-06-07 NOTE — Progress Notes (Signed)
Patient ID: [name], male   DOB: 1941-01-20, 72 y.o.   MRN: [id number] PCP: Dr. Tiburcio Pea   This is a 72 yo with complex history including CAD s/p CABG, paroxysmal atrial fibrillation, PVCs, chronic dyspnea, aortic root aneurysm, and diastolic CHF who presents for cardiology followup. Last Lexiscan myoview in 4/12 showed EF 55% and no ischemia or infarction.  Most recent Samuel angiogram of the chest in 5/14 showed stable aortic root dilation at 4.3 cm.    Patient had atrial fibrillation with RVR in 1/11 converted back to NSR spontaneously. Patient was on Pradaxa for a while but stopped it. He has had no documented atrial fibrillation recurrence.   [name] continues to have stable exertional dyspnea.  He notes it with walking up stairs or up a hill.  It has been stable for several years years.  He is able to do yardwork without much problem.  He tried taking Lasix but this did not help so he stopped it.  He has tried inhalers in the past but these did not help so he stopped them.  No chest pain. Weight is up 2 lbs compared to prior appointment. He is tolerating pravastatin.  He cannot tolerate CPAP.  Blood pressure is mildly elevated today.    Labs (2/12): K 4.2, creatinine 1.07  Labs (5/12): HCT 44.6, TSH normal, LDL 78, HDL 34, K 4.2, creatinine 1.1 Labs (12/12): BNP 56 Labs (5/13): K 3.7, creatinine 1.05 Labs (10/13) ;K 4.6, creatinine 1.15, LDL 98, HDL 33  ECG: NSR, RBBB  PMH:  1. CAD: s/p CABG in 2007. NSTEMI 1/11 in setting of atrial fibrillation with RVR. LHC at that time showed stable disease: EF 55%, SVG-RCA with 50% distal stenosis, .sequential SVG-OM and ramus with patent OM branch and occluded ramus branch, SVG-D patent, LIMA-LAD patent.  Lexiscan myoview (5/12) with EF 55%, no ischemia or infarction.  2. Diastolic CHF: Echo (2/12) with EF 50% and inferior hypokinesis, ascending aorta dilated to 4.6 cm, moderate RV dilation and dysfunction.  3. Aortic root aneurysm: Samuel angiogram  chest (5/12) showed aortic root 4.5 cm, ascending aorta 4.1 cm, remainder of thoracic aorta normal in caliber.  MRA chest (5/13) with 4.4 cm aortic root, 4.2 cm ascending aorta.  Cardiac MRI/MRA chest (5/14) with EF 50%, mid to apical anterior hypokinesis, trileaflet aortic valve, 4.3 cm aortic root, 4.2 cm ascending aorta. 4. RLL nodule: stable over 3 years  5. OSA on CPAP  6. Chronic RBBB  7. Paroxysmal atrial fibrillation: Only noted in 1/11. 3 week event monitor in 2/12 with no atrial fibrillation.  8. HTN  9. Hyperlipidemia: Fatigue with simvastatin.  10. GERD  11. BPH  12. PE in 2007 at time of CABG.  13. Syncopal episode in 2/12 (suspect vasovagal). 3-week even monitor showed PVCs, no atrial fibrillation.  14. 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   FH: No premature CAD   ROS: All systems reviewed and negative except as noted in HPI.   Current Outpatient Prescriptions  Medication Sig Dispense Refill  . aspirin 325 MG EC tablet Take 325 mg by mouth daily.        . clonazePAM (KLONOPIN) 0.5 MG tablet Take 0.5 mg by mouth as needed.      [name] Calcium (STOOL SOFTENER PO) Take by mouth as needed.      . fish oil-omega-3 fatty acids 1000 MG capsule Take 1,200 g by mouth daily.       Marland Kitchen  Garlic 100 MG TABS Take 1,000 mg by mouth daily.       Marland Kitchen lisinopril (PRINIVIL,ZESTRIL) 5 MG tablet Take 1 tablet (5 mg total) by mouth daily.  30 tablet  6  . metoprolol succinate (TOPROL-XL) 25 MG 24 hr tablet Take 1 tablet daily. Take extra as needed for palpitations  45 tablet  11  . Multiple Vitamin (MULTIVITAMIN) tablet Take 1 tablet by mouth daily.        Marland Kitchen omeprazole (PRILOSEC) 20 MG capsule Take 1 capsule by mouth daily.      . pravastatin (PRAVACHOL) 80 MG tablet TAKE ONE TABLET BY MOUTH EVERY EVENING  30 tablet  3  . Tamsulosin HCl (FLOMAX) 0.4 MG CAPS Take 0.4 mg by mouth daily.         No current facility-administered medications for this visit.     BP 145/90  Pulse 81  Ht 5\' 11"  (1.803 m)  Wt 238 lb (107.956 kg)  BMI 33.21 kg/m2  SpO2 95% General: NAD, obese  Neck: JVP 7 cm, no thyromegaly or thyroid nodule.  Lungs: Clear to auscultation bilaterally with normal respiratory effort.  CV: Nondisplaced PMI. Heart regular S1/S2, no S3/S4, no murmur. No edema. No carotid bruit. Normal pedal pulses.  Abdomen: Soft, nontender, no hepatosplenomegaly, no distention.  Neurologic: Alert and oriented x 3.  Psych: Normal affect.  Extremities: No clubbing or cyanosis.   Assessment/Plan:  Aortic aneurysm  Stable 4.3 cm aortic root. Repeat MRA chest in 5/15. Continue BP control.  ATRIAL FIBRILLATION  Paroxysmal atrial fibrillation with only 1 known episode in 1/11. No atrial fibrillation on 3-week event monitor in 2/12. In the past, I told him that it would be safest, given his risk factor profile, to be anticoagulated with apixaban, Xarelto, or coumadin. He declined anticoagulation. He is on ASA. If he has another atrial fibrillation episode, I will again strongly encourage anticoagulation.  CAD  CAD s/p CABG. No ischemic symptoms. Continue ASA, Toprol XL, and lisinopril. He he has tolerated pravastatin.  DYSPNEA Chronic and stable. He is not volume overloaded on exam and trial of Lasix did not help. Inhalers have not helped in the past. He cannot tolerate CPAP for his OSA. Continue efforts at weight loss. I suspect obesity and deconditioning play a role.  I asked him to try to walk for 20-30 minutes daily.  HYPERLIPIDEMIA He has tolerated pravastatin without problems.  Lipids/LFTs.  HYPERTENSION  BP high today.  He will check daily at home and we will call for readings.   [name] 06/07/2013 11:17 PM

## 2013-06-18 ENCOUNTER — Telehealth: Payer: Self-pay | Admitting: *Deleted

## 2013-06-18 NOTE — Telephone Encounter (Signed)
Message copied by Lesle Chris on Thu Jun 18, 2013  2:35 PM ------      Message from: Lesle Chris      Created: Thu Jun 04, 2013  3:59 PM       Call office in 2 weeks with BP readings, OV 06/04/2013 Shirlee Latch.   ------

## 2013-06-22 NOTE — Telephone Encounter (Signed)
Call placed to patient, stated he had misplaced his list.  Asked him to mail or drop off at office after he locates.  Patient verbalized understanding.

## 2013-07-13 ENCOUNTER — Encounter: Payer: Self-pay | Admitting: *Deleted

## 2013-07-14 ENCOUNTER — Telehealth: Payer: Self-pay | Admitting: *Deleted

## 2013-07-14 NOTE — Telephone Encounter (Signed)
Patient brought BP readings to office.    7/10 - 141/85  74  1:25 pm 7/12 - 127/74  82  10:30 pm  7/14 - 122/82  84  12:25 pm  7/15 - 98/70  89  6:30 pm  7/16 - 108/64  77  10:55 pm  7/19 - 122/80  75  10:15 pm  7/20 - 116/74  82  5:15 pm 7/21 - 111/71  90  5:45 pm  7/22 - 107/71  87  10:30 pm

## 2013-07-14 NOTE — Telephone Encounter (Signed)
Most of these are ok, no changes

## 2013-07-16 ENCOUNTER — Other Ambulatory Visit: Payer: Self-pay | Admitting: Cardiology

## 2013-07-16 NOTE — Telephone Encounter (Signed)
Patient notified via voicemail.

## 2013-08-19 ENCOUNTER — Other Ambulatory Visit: Payer: Self-pay | Admitting: Cardiology

## 2013-09-21 ENCOUNTER — Other Ambulatory Visit: Payer: Self-pay | Admitting: Cardiology

## 2013-10-22 ENCOUNTER — Other Ambulatory Visit: Payer: Self-pay | Admitting: Cardiology

## 2013-11-10 ENCOUNTER — Other Ambulatory Visit: Payer: Self-pay | Admitting: Family Medicine

## 2013-11-10 ENCOUNTER — Ambulatory Visit
Admission: RE | Admit: 2013-11-10 | Discharge: 2013-11-10 | Disposition: A | Discharge status: Home / Self Care | Payer: PRIVATE HEALTH INSURANCE | Source: Ambulatory Visit | Attending: Family Medicine | Admitting: Family Medicine

## 2013-11-10 DIAGNOSIS — R05 Cough: Secondary | ICD-10-CM

## 2013-11-10 DIAGNOSIS — R059 Cough, unspecified: Secondary | ICD-10-CM

## 2013-11-21 ENCOUNTER — Other Ambulatory Visit: Payer: Self-pay | Admitting: Cardiology

## 2013-12-24 ENCOUNTER — Encounter: Payer: Self-pay | Admitting: *Deleted

## 2013-12-24 ENCOUNTER — Encounter: Payer: Self-pay | Admitting: Cardiology

## 2013-12-24 ENCOUNTER — Ambulatory Visit (INDEPENDENT_AMBULATORY_CARE_PROVIDER_SITE_OTHER): Payer: Medicare Other | Admitting: Cardiology

## 2013-12-24 VITALS — BP 154/96 | HR 76 | Ht 71.0 in | Wt 239.8 lb

## 2013-12-24 DIAGNOSIS — I10 Essential (primary) hypertension: Secondary | ICD-10-CM

## 2013-12-24 DIAGNOSIS — I5032 Chronic diastolic (congestive) heart failure: Secondary | ICD-10-CM

## 2013-12-24 DIAGNOSIS — R0602 Shortness of breath: Secondary | ICD-10-CM

## 2013-12-24 DIAGNOSIS — I4891 Unspecified atrial fibrillation: Secondary | ICD-10-CM

## 2013-12-24 DIAGNOSIS — I719 Aortic aneurysm of unspecified site, without rupture: Secondary | ICD-10-CM

## 2013-12-24 DIAGNOSIS — E785 Hyperlipidemia, unspecified: Secondary | ICD-10-CM

## 2013-12-24 DIAGNOSIS — I7121 Aneurysm of the ascending aorta, without rupture: Secondary | ICD-10-CM

## 2013-12-24 DIAGNOSIS — I712 Thoracic aortic aneurysm, without rupture, unspecified: Secondary | ICD-10-CM

## 2013-12-24 DIAGNOSIS — I2510 Atherosclerotic heart disease of native coronary artery without angina pectoris: Secondary | ICD-10-CM

## 2013-12-24 MED ORDER — ROSUVASTATIN CALCIUM 5 MG PO TABS: ORAL_TABLET | ORAL | Status: DC

## 2013-12-24 NOTE — Patient Instructions (Addendum)
Start crestor 5mg  every other day.  Start coenzyme Q10 200mg  daily. You do not need a prescription for this.   Your physician has requested that you have an echocardiogram. Echocardiography is a painless test that uses sound waves to create images of your heart. It provides your doctor with information about the size and shape of your heart and how well your heart's chambers and valves are working. This procedure takes approximately one hour. There are no restrictions for this procedure.  Your physician has requested that you regularly monitor and record your blood pressure readings at home. Please use the same machine at the same time of day to check your readings and record. I will call you in about 2 weeks to get the readings. [name]  Your physician recommends that you return for a FASTING lipid profile /liver profile in 2 months.   Schedule an appointment for an MRA of your chest in May 2015.   Your physician wants you to follow-up in: 6 months with Dr Aundra Dubin. (July 2015). You will receive a reminder letter in the mail two months in advance. If you don't receive a letter, please call our office to schedule the follow-up appointment.

## 2013-12-24 NOTE — Progress Notes (Signed)
Patient ID: [name], male   DOB: Nov 04, 1941, 73 y.o.   MRN: [id number] PCP: Dr. Kenton Bennett   This is a 73 yo with complex history including CAD s/p CABG, paroxysmal atrial fibrillation, PVCs, chronic dyspnea, aortic root aneurysm, and diastolic CHF who presents for cardiology followup. Last Lexiscan myoview in 4/12 showed EF 55% and no ischemia or infarction.  Most recent MR angiogram of the chest in 5/14 showed stable aortic root dilation at 4.3 cm.    Patient had atrial fibrillation with RVR in 1/11 converted back to NSR spontaneously. Patient was on Pradaxa for a while but stopped it. He has had no documented atrial fibrillation recurrence.   Mr [name] continues to have exertional dyspnea.  He notes it with walking up stairs or up a hill.  It has been a bit worse recently.  He had tried taking Lasix in the past but this did not help so he stopped it. However, recently, he started it again at 40 mg qod, and this time he feels like the Lasix has helped his breathing.  He has tried inhalers in the past but these did not help so he stopped them.  No chest pain. Weight is stable compared to prior appointment. He stopped pravastatin due to muscle weakness.  He cannot tolerate CPAP.  Blood pressure is mildly elevated today.    Labs (2/12): K 4.2, creatinine 1.07  Labs (5/12): HCT 44.6, TSH normal, LDL 78, HDL 34, K 4.2, creatinine 1.1 Labs (12/12): BNP 56 Labs (5/13): K 3.7, creatinine 1.05 Labs (10/13): K 4.6, creatinine 1.15, LDL 98, HDL 33 Labs (7/14): creatinine 1.06, LDL 81, HDL 40  ECG: NSR, RBBB  PMH:  1. CAD: s/p CABG in 2007. NSTEMI 1/11 in setting of atrial fibrillation with RVR. LHC at that time showed stable disease: EF 55%, SVG-RCA with 50% distal stenosis, .sequential SVG-OM and ramus with patent OM branch and occluded ramus branch, SVG-D patent, LIMA-LAD patent.  Lexiscan myoview (5/12) with EF 55%, no ischemia or infarction.  2. Diastolic CHF: Echo (4/09) with EF 50% and inferior  hypokinesis, ascending aorta dilated to 4.6 cm, moderate RV dilation and dysfunction.  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  MRA chest (5/13) with 4.4 cm aortic root, 4.2 cm ascending aorta.  Cardiac MRI/MRA chest (5/14) with EF 50%, mid to apical anterior hypokinesis, trileaflet aortic valve, 4.3 cm aortic root, 4.2 cm ascending aorta. 4. RLL nodule: stable over 3 years  5. OSA on CPAP  6. Chronic RBBB  7. Paroxysmal atrial fibrillation: Only noted in 1/11. 3 week event monitor in 2/12 with no atrial fibrillation.  8. HTN  9. Hyperlipidemia: Fatigue with simvastatin.  10. GERD  11. BPH  12. PE in 2007 at time of CABG.  13. Syncopal episode in 2/12 (suspect vasovagal). 3-week even monitor showed PVCs, no atrial fibrillation.  14. 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   FH: No premature CAD   ROS: All systems reviewed and negative except as noted in HPI.   Current Outpatient Prescriptions  Medication Sig Dispense Refill  . aspirin 325 MG EC tablet Take 325 mg by mouth daily.        . clonazePAM (KLONOPIN) 0.5 MG tablet Take 0.5 mg by mouth as needed.      [name] Calcium (STOOL SOFTENER PO) Take by mouth as needed.      [name] Kitchen  fish oil-omega-3 fatty acids 1000 MG capsule Take 1,200 g by mouth daily.       . Garlic 099 MG TABS Take 1,000 mg by mouth daily.       [name] Kitchen lisinopril (PRINIVIL,ZESTRIL) 5 MG tablet TAKE ONE TABLET BY MOUTH ONCE DAILY  30 tablet  0  . metoprolol succinate (TOPROL-XL) 25 MG 24 hr tablet TAKE ONE TABLET BY MOUTH EVERY DAY --  TAKE  EXTRA  AS  NEEDED  FOR  PALPITATIONS  45 tablet  1  . Multiple Vitamin (MULTIVITAMIN) tablet Take 1 tablet by mouth daily.        [name] Kitchen omeprazole (PRILOSEC) 20 MG capsule Take 1 capsule by mouth daily.      . Tamsulosin HCl (FLOMAX) 0.4 MG CAPS Take 0.4 mg by mouth daily.        [name] Kitchen co-enzyme Q-10 30 MG  capsule Take 1 capsule (30 mg total) by mouth 3 (three) times daily.      . Coenzyme Q10 200 MG TABS Take by mouth.    0  . furosemide (LASIX) 40 MG tablet 1 tablet every other day      . rosuvastatin (CRESTOR) 5 MG tablet 1 every other day  15 tablet  3   No current facility-administered medications for this visit.    BP 154/96  Pulse 76  Ht 5\' 11"  (1.803 m)  Wt 108.773 kg (239 lb 12.8 oz)  BMI 33.46 kg/m2 General: NAD, obese  Neck: JVP 7 cm, no thyromegaly or thyroid nodule.  Lungs: Clear to auscultation bilaterally with normal respiratory effort.  CV: Nondisplaced PMI. Heart regular S1/S2, no S3/S4, no murmur. No edema. No carotid bruit. Normal pedal pulses.  Abdomen: Soft, nontender, no hepatosplenomegaly, no distention.  Neurologic: Alert and oriented x 3.  Psych: Normal affect.  Extremities: No clubbing or cyanosis.   Assessment/Plan:  Aortic aneurysm  Stable 4.3 cm aortic root. Repeat MRA chest in 5/15. Continue BP control.  ATRIAL FIBRILLATION  Paroxysmal atrial fibrillation with only 1 known episode in 1/11. No atrial fibrillation on 3-week event monitor in 2/12. In the past, I told him that it would be safest, given his risk factor profile, to be anticoagulated with apixaban, Xarelto, or coumadin. He declined anticoagulation. He is on ASA. If he has another atrial fibrillation episode, I will again strongly encourage anticoagulation.  CAD  CAD s/p CABG. No chest pain. Continue ASA, Toprol XL, and lisinopril.  DYSPNEA Chronic, with some improvement recently on Lasix.  He does not look volume overloaded today.  I suspect diastolic CHF is playing a role here.  I would like him to continue Lasix 40 mg qod.  I will get an echocardiogram to reassess LV and RV function.  I also think COPD is a likely contributor to his dyspnea.  He says inhalers have not helped in the past.  I think that he needs formal PFTs.  He is now apparently seeing a pulmonologist in [place].  I suspect  that PFTs will be ordered.  He had a BMET at his PCP's office today.  I will call for a copy.   HYPERLIPIDEMIA He has not tolerated pravastatin.  I will try him on Crestor 5 mg every other day along with coenzyme Q 10 200 mg daily. Lipids/LFTs in 2 months.  HYPERTENSION  BP high today.  He will check daily at home and we will call for readings in 2 wks.   Loralie Champagne 12/24/2013 11:45 PM

## 2013-12-25 ENCOUNTER — Ambulatory Visit (HOSPITAL_COMMUNITY): Payer: Medicare Other | Attending: Cardiology | Admitting: Cardiology

## 2013-12-25 ENCOUNTER — Encounter: Payer: Self-pay | Admitting: Internal Medicine

## 2013-12-25 DIAGNOSIS — I2510 Atherosclerotic heart disease of native coronary artery without angina pectoris: Secondary | ICD-10-CM | POA: Insufficient documentation

## 2013-12-25 DIAGNOSIS — I712 Thoracic aortic aneurysm, without rupture, unspecified: Secondary | ICD-10-CM

## 2013-12-25 DIAGNOSIS — I10 Essential (primary) hypertension: Secondary | ICD-10-CM | POA: Insufficient documentation

## 2013-12-25 DIAGNOSIS — I379 Nonrheumatic pulmonary valve disorder, unspecified: Secondary | ICD-10-CM | POA: Insufficient documentation

## 2013-12-25 DIAGNOSIS — J449 Chronic obstructive pulmonary disease, unspecified: Secondary | ICD-10-CM | POA: Insufficient documentation

## 2013-12-25 DIAGNOSIS — R0609 Other forms of dyspnea: Secondary | ICD-10-CM | POA: Insufficient documentation

## 2013-12-25 DIAGNOSIS — J4489 Other specified chronic obstructive pulmonary disease: Secondary | ICD-10-CM | POA: Insufficient documentation

## 2013-12-25 DIAGNOSIS — I4891 Unspecified atrial fibrillation: Secondary | ICD-10-CM | POA: Insufficient documentation

## 2013-12-25 DIAGNOSIS — Z87891 Personal history of nicotine dependence: Secondary | ICD-10-CM | POA: Insufficient documentation

## 2013-12-25 DIAGNOSIS — I079 Rheumatic tricuspid valve disease, unspecified: Secondary | ICD-10-CM | POA: Insufficient documentation

## 2013-12-25 DIAGNOSIS — E785 Hyperlipidemia, unspecified: Secondary | ICD-10-CM | POA: Insufficient documentation

## 2013-12-25 DIAGNOSIS — I5022 Chronic systolic (congestive) heart failure: Secondary | ICD-10-CM

## 2013-12-25 DIAGNOSIS — I719 Aortic aneurysm of unspecified site, without rupture: Secondary | ICD-10-CM

## 2013-12-25 DIAGNOSIS — I059 Rheumatic mitral valve disease, unspecified: Secondary | ICD-10-CM | POA: Insufficient documentation

## 2013-12-25 DIAGNOSIS — I5032 Chronic diastolic (congestive) heart failure: Secondary | ICD-10-CM

## 2013-12-25 DIAGNOSIS — R0989 Other specified symptoms and signs involving the circulatory and respiratory systems: Secondary | ICD-10-CM | POA: Insufficient documentation

## 2013-12-25 DIAGNOSIS — I509 Heart failure, unspecified: Secondary | ICD-10-CM | POA: Insufficient documentation

## 2013-12-25 DIAGNOSIS — I7121 Aneurysm of the ascending aorta, without rupture: Secondary | ICD-10-CM

## 2013-12-25 NOTE — Progress Notes (Signed)
Echo performed. 

## 2013-12-27 ENCOUNTER — Other Ambulatory Visit: Payer: Self-pay | Admitting: Cardiology

## 2013-12-28 ENCOUNTER — Other Ambulatory Visit: Payer: Self-pay | Admitting: *Deleted

## 2013-12-28 DIAGNOSIS — I5032 Chronic diastolic (congestive) heart failure: Secondary | ICD-10-CM

## 2013-12-28 DIAGNOSIS — I4891 Unspecified atrial fibrillation: Secondary | ICD-10-CM

## 2013-12-28 DIAGNOSIS — I10 Essential (primary) hypertension: Secondary | ICD-10-CM

## 2013-12-28 DIAGNOSIS — I712 Thoracic aortic aneurysm, without rupture: Secondary | ICD-10-CM

## 2013-12-28 DIAGNOSIS — I7121 Aneurysm of the ascending aorta, without rupture: Secondary | ICD-10-CM

## 2013-12-28 MED ORDER — FUROSEMIDE 40 MG PO TABS: ORAL_TABLET | ORAL | Status: DC

## 2013-12-28 MED ORDER — LISINOPRIL 5 MG PO TABS: ORAL_TABLET | ORAL | Status: DC

## 2014-01-11 ENCOUNTER — Telehealth: Payer: Self-pay | Admitting: *Deleted

## 2014-01-11 NOTE — Telephone Encounter (Signed)
HYPERTENSION  BP high today. He will check daily at home and we will call for readings in 2 wks.  [name]  12/24/2013  01/11/14 LMTCB

## 2014-01-12 NOTE — Telephone Encounter (Signed)
LMTCB

## 2014-01-14 NOTE — Telephone Encounter (Signed)
Recent BP readings--1/14 132/84   1/15 119/74   1/16 120/74   1/17 158/89   1/18 119/82   1/19 129/87   1/20 126/79 1/21 118/66   1/22 114/69   1/23 133/86   1/24 120/84   1/25 115/66   1/26 122/74   1/28 122/82. I will forward to Dr Aundra Dubin for review.

## 2014-01-14 NOTE — Telephone Encounter (Signed)
Pt.notified

## 2014-01-14 NOTE — Telephone Encounter (Signed)
Patient is returning your call. Please call back.  °

## 2014-01-14 NOTE — Telephone Encounter (Signed)
Pt advised.

## 2014-01-14 NOTE — Telephone Encounter (Signed)
Good BPs, no changes.

## 2014-01-21 ENCOUNTER — Ambulatory Visit (INDEPENDENT_AMBULATORY_CARE_PROVIDER_SITE_OTHER): Payer: Medicare Other | Admitting: Pulmonary Disease

## 2014-01-21 ENCOUNTER — Encounter: Payer: Self-pay | Admitting: Pulmonary Disease

## 2014-01-21 VITALS — BP 130/92 | HR 108 | Ht 71.0 in | Wt 242.0 lb

## 2014-01-21 DIAGNOSIS — G4733 Obstructive sleep apnea (adult) (pediatric): Secondary | ICD-10-CM

## 2014-01-21 NOTE — Patient Instructions (Signed)
Will arrange for new CPAP supplies and get report from CPAP machine Follow up in 3 months

## 2014-01-21 NOTE — Assessment & Plan Note (Signed)
Will restart him on CPAP 12 cm H2O.  Will arrange for CPAP mask refit.  Explained he may need repeat sleep study for insurance purposes.

## 2014-01-21 NOTE — Progress Notes (Signed)
Chief Complaint  Patient presents with  . Sleep Apnea    Has not been using CPAP machine. He is in need of possibly a new machine or new parts to fix his current one.    History of Present Illness: [name] is a 73 y.o. male with OSA.  I last saw him in July 2013.  He has not used CPAP for several months.  He had trouble with his mask fit.  Since he stopped using CPAP he has noticed more trouble with his sleep.  He is snoring, and wakes up feeling like he can't catch his breath.  He wakes up frequently during the night, and feels sleepy during the day.  He worked with [facility] for his DME, and was on CPAP 12 cm H2O.  TESTS: PSG 05/12/08 >> AHI 54 with Complex apnea   [name]  has a past medical history of CAD (coronary artery disease); HTN (hypertension); Diastolic dysfunction; GERD (gastroesophageal reflux disease); Colon cancer (1992); BPH (benign prostatic hyperplasia); PE (pulmonary embolism) (2007); Lung granuloma; OSA on CPAP; Dyspnea; Atrial fibrillation; RBBB (right bundle branch block); Hyperlipidemia; Personal history of colonic polyps (01/22/2000); and Diverticulosis.  [name]  has past surgical history that includes Colectomy (1992); Inguinal hernia repair; Laparoscopic cholecystectomy (2004); Coronary artery bypass graft (2007); and Ventral hernia repair (2009).  Prior to Admission medications   Medication Sig Start Date End Date Taking? Authorizing Provider  aspirin 325 MG EC tablet Take 325 mg by mouth daily.     Yes Historical Provider, MD  clonazePAM (KLONOPIN) 0.5 MG tablet Take 0.5 mg by mouth as needed.   Yes Historical Provider, MD  co-enzyme Q-10 30 MG capsule Take 1 capsule (30 mg total) by mouth 3 (three) times daily. 05/07/12 01/21/15 Yes Larey Dresser, MD  Coenzyme Q10 200 MG TABS Take by mouth. 12/24/13  Yes Larey Dresser, MD  Docusate Calcium (STOOL SOFTENER PO) Take by mouth as needed.   Yes Historical Provider, MD  fish oil-omega-3 fatty acids  1000 MG capsule Take 1,200 g by mouth daily.    Yes Historical Provider, MD  furosemide (LASIX) 40 MG tablet 1 tablet every other day 12/28/13  Yes Larey Dresser, MD  Garlic 509 MG TABS Take 1,000 mg by mouth daily.    Yes Historical Provider, MD  lisinopril (PRINIVIL,ZESTRIL) 5 MG tablet TAKE ONE TABLET BY MOUTH ONCE DAILY 12/28/13  Yes Larey Dresser, MD  metoprolol succinate (TOPROL-XL) 25 MG 24 hr tablet TAKE ONE TABLET BY MOUTH EVERY DAY --  TAKE  EXTRA  AS  NEEDED  FOR  PALPITATIONS 10/22/13  Yes Larey Dresser, MD  Multiple Vitamin (MULTIVITAMIN) tablet Take 1 tablet by mouth daily.     Yes Historical Provider, MD  omeprazole (PRILOSEC) 20 MG capsule Take 1 capsule by mouth daily. 05/18/13  Yes Historical Provider, MD  rosuvastatin (CRESTOR) 5 MG tablet 1 every other day 12/24/13  Yes Larey Dresser, MD  Tamsulosin HCl (FLOMAX) 0.4 MG CAPS Take 0.4 mg by mouth daily.     Yes Historical Provider, MD    No Known Allergies   Physical Exam:  General - No distress ENT - No sinus tenderness, no oral exudate, no LAN, MP 3, wears dentures Cardiac - s1s2 regular, no murmur Chest - No wheeze/rales/dullness Back - No focal tenderness Abd - Soft, non-tender Ext - No edema Neuro - Normal strength Skin - No rashes Psych - normal mood, and behavior  Assessment/Plan:  Chesley Mires, MD [facility] Pager:  [phone]

## 2014-02-03 ENCOUNTER — Other Ambulatory Visit: Payer: Self-pay | Admitting: Physician Assistant

## 2014-02-10 ENCOUNTER — Other Ambulatory Visit: Payer: Self-pay | Admitting: Cardiology

## 2014-03-12 ENCOUNTER — Other Ambulatory Visit: Payer: Self-pay | Admitting: Cardiology

## 2014-04-16 ENCOUNTER — Ambulatory Visit (HOSPITAL_COMMUNITY): Payer: Medicare Other

## 2014-05-07 ENCOUNTER — Ambulatory Visit (HOSPITAL_COMMUNITY): Admission: RE | Admit: 2014-05-07 | Payer: Medicare Other | Source: Ambulatory Visit

## 2014-05-11 ENCOUNTER — Other Ambulatory Visit: Payer: Self-pay | Admitting: Cardiology

## 2014-05-12 ENCOUNTER — Telehealth: Payer: Self-pay | Admitting: Cardiology

## 2014-05-12 ENCOUNTER — Encounter: Payer: Self-pay | Admitting: Cardiology

## 2014-05-12 NOTE — Telephone Encounter (Signed)
New message          Pt is ready to reschedule MRA

## 2014-05-19 NOTE — Telephone Encounter (Signed)
Spoke with patient about recent lab results 

## 2014-05-19 NOTE — Telephone Encounter (Signed)
New message     Pt said you talked to his wife this am but you want to talk to him---please call

## 2014-06-01 ENCOUNTER — Other Ambulatory Visit: Payer: Self-pay | Admitting: *Deleted

## 2014-06-01 MED ORDER — ROSUVASTATIN CALCIUM 5 MG PO TABS: 5.0000 mg | ORAL_TABLET | Freq: Every day | ORAL | Status: DC

## 2014-06-03 ENCOUNTER — Ambulatory Visit (HOSPITAL_COMMUNITY)
Admission: RE | Admit: 2014-06-03 | Discharge: 2014-06-03 | Disposition: A | Discharge status: Home / Self Care | Payer: Medicare Other | Source: Ambulatory Visit | Attending: Cardiology | Admitting: Cardiology

## 2014-06-03 DIAGNOSIS — I5032 Chronic diastolic (congestive) heart failure: Secondary | ICD-10-CM

## 2014-06-03 DIAGNOSIS — I712 Thoracic aortic aneurysm, without rupture: Secondary | ICD-10-CM

## 2014-06-03 DIAGNOSIS — I10 Essential (primary) hypertension: Secondary | ICD-10-CM

## 2014-06-03 DIAGNOSIS — I7121 Aneurysm of the ascending aorta, without rupture: Secondary | ICD-10-CM

## 2014-06-03 DIAGNOSIS — I4891 Unspecified atrial fibrillation: Secondary | ICD-10-CM | POA: Insufficient documentation

## 2014-06-03 DIAGNOSIS — I714 Abdominal aortic aneurysm, without rupture, unspecified: Secondary | ICD-10-CM | POA: Insufficient documentation

## 2014-06-03 MED ORDER — GADOBENATE DIMEGLUMINE 529 MG/ML IV SOLN
20.0000 mL | Freq: Once | INTRAVENOUS | Status: AC
  Administered 2014-06-03: 20 mL via INTRAVENOUS

## 2014-06-09 ENCOUNTER — Telehealth: Payer: Self-pay | Admitting: *Deleted

## 2014-06-09 NOTE — Telephone Encounter (Signed)
Notes Recorded by Larey Dresser, MD on 06/03/2014  Mild dilation of the aortic root an ascending aorta.  Pt notified

## 2014-07-07 ENCOUNTER — Other Ambulatory Visit (HOSPITAL_COMMUNITY): Payer: Self-pay | Admitting: Orthopaedic Surgery

## 2014-07-07 DIAGNOSIS — I729 Aneurysm of unspecified site: Secondary | ICD-10-CM

## 2014-07-09 ENCOUNTER — Ambulatory Visit (HOSPITAL_COMMUNITY)
Admission: RE | Admit: 2014-07-09 | Discharge: 2014-07-09 | Disposition: A | Discharge status: Home / Self Care | Payer: Medicare Other | Source: Ambulatory Visit | Attending: Orthopaedic Surgery | Admitting: Orthopaedic Surgery

## 2014-07-09 DIAGNOSIS — I77811 Abdominal aortic ectasia: Secondary | ICD-10-CM | POA: Insufficient documentation

## 2014-07-09 DIAGNOSIS — I729 Aneurysm of unspecified site: Secondary | ICD-10-CM

## 2014-07-09 DIAGNOSIS — Z1389 Encounter for screening for other disorder: Secondary | ICD-10-CM | POA: Insufficient documentation

## 2014-07-15 ENCOUNTER — Other Ambulatory Visit (HOSPITAL_COMMUNITY): Payer: Self-pay | Admitting: Orthopaedic Surgery

## 2014-07-15 DIAGNOSIS — M5442 Lumbago with sciatica, left side: Secondary | ICD-10-CM

## 2014-07-20 ENCOUNTER — Ambulatory Visit (HOSPITAL_COMMUNITY): Payer: Medicare Other | Attending: Orthopaedic Surgery

## 2014-07-23 ENCOUNTER — Ambulatory Visit (INDEPENDENT_AMBULATORY_CARE_PROVIDER_SITE_OTHER): Payer: Medicare Other | Admitting: Cardiology

## 2014-07-23 ENCOUNTER — Encounter: Payer: Self-pay | Admitting: Cardiology

## 2014-07-23 VITALS — BP 126/86 | HR 80 | Ht 71.0 in | Wt 238.0 lb

## 2014-07-23 DIAGNOSIS — I2510 Atherosclerotic heart disease of native coronary artery without angina pectoris: Secondary | ICD-10-CM

## 2014-07-23 DIAGNOSIS — R0602 Shortness of breath: Secondary | ICD-10-CM

## 2014-07-23 DIAGNOSIS — I719 Aortic aneurysm of unspecified site, without rupture: Secondary | ICD-10-CM

## 2014-07-23 DIAGNOSIS — I10 Essential (primary) hypertension: Secondary | ICD-10-CM

## 2014-07-23 DIAGNOSIS — I4819 Other persistent atrial fibrillation: Secondary | ICD-10-CM

## 2014-07-23 DIAGNOSIS — I4891 Unspecified atrial fibrillation: Secondary | ICD-10-CM

## 2014-07-23 DIAGNOSIS — Z79899 Other long term (current) drug therapy: Secondary | ICD-10-CM

## 2014-07-23 DIAGNOSIS — E785 Hyperlipidemia, unspecified: Secondary | ICD-10-CM

## 2014-07-23 MED ORDER — ROSUVASTATIN CALCIUM 5 MG PO TABS: 5.0000 mg | ORAL_TABLET | Freq: Every day | ORAL | Status: DC

## 2014-07-23 MED ORDER — METOPROLOL SUCCINATE ER 25 MG PO TB24: ORAL_TABLET | ORAL | Status: DC

## 2014-07-23 MED ORDER — LISINOPRIL 5 MG PO TABS
ORAL_TABLET | ORAL | Status: DC
Start: 2014-07-23 — End: 2015-08-15

## 2014-07-23 MED ORDER — NITROGLYCERIN 0.4 MG SL SUBL: 0.4000 mg | SUBLINGUAL_TABLET | SUBLINGUAL | Status: DC | PRN

## 2014-07-23 NOTE — Patient Instructions (Signed)
The current medical regimen is effective;  continue present plan and medications.  Please have blood work today (Lipid/Liver)  Your physician has requested that you have a [place]. For further information please visit [email or web address]. Please follow instruction sheet, as given.  Follow up in 4 months with Dr Aundra Dubin.

## 2014-07-24 LAB — HEPATIC FUNCTION PANEL
ALBUMIN: 4.1 g/dL (ref 3.5–5.2)
ALT: 26 U/L (ref 0–53)
AST: 26 U/L (ref 0–37)
Alkaline Phosphatase: 42 U/L (ref 39–117)
Bilirubin, Direct: 0.1 mg/dL (ref 0.0–0.3)
Indirect Bilirubin: 0.4 mg/dL (ref 0.2–1.2)
Total Bilirubin: 0.5 mg/dL (ref 0.2–1.2)
Total Protein: 6.7 g/dL (ref 6.0–8.3)

## 2014-07-24 LAB — LIPID PANEL
CHOL/HDL RATIO: 3.1 ratio
Cholesterol: 121 mg/dL (ref 0–200)
HDL: 39 mg/dL — AB (ref >39)
LDL CALC: 59 mg/dL (ref 0–99)
Triglycerides: 115 mg/dL (ref <150)
VLDL: 23 mg/dL (ref 0–40)

## 2014-07-25 NOTE — Progress Notes (Signed)
Patient ID: [name], male   DOB: 01-Mar-1941, 73 y.o.   MRN: [id number] PCP: Dr. Kenton Kingfisher   This is a 73 yo with complex history including CAD s/p CABG, paroxysmal atrial fibrillation, PVCs, chronic dyspnea, aortic root aneurysm, and diastolic CHF who presents for cardiology followup. Last Lexiscan myoview in 4/12 showed EF 55% and no ischemia or infarction.  Most recent Samuel angiogram of the chest in 6/15 showed stable aortic root dilation at 4.3 cm.    Patient had atrial fibrillation with RVR in 1/11 converted back to NSR spontaneously. Patient was on Pradaxa for a while but stopped it. He has had no documented atrial fibrillation recurrence.   [name] continues to have exertional dyspnea.  He notes it with walking up stairs or up a hill. He can walk about 1/2 mile on flat ground. He had tried taking Lasix regularly in the past but this did not help so he stopped it.  He now takes Lasix about once a month when he notes swelling.  He has tried inhalers in the past but these did not help so he stopped them.  He says that his PCP did a "breathing test" and told him that he did not have COPD.  He has had some chest pain for the last 2-3 weeks.  It is central chest pain that lasts for about 5 minutes at a time, no trigger.  No exertional chest pain. No NTG use.    Labs (2/12): K 4.2, creatinine 1.07  Labs (5/12): HCT 44.6, TSH normal, LDL 78, HDL 34, K 4.2, creatinine 1.1 Labs (12/12): BNP 56 Labs (5/13): K 3.7, creatinine 1.05 Labs (10/13): K 4.6, creatinine 1.15, LDL 98, HDL 33 Labs (7/14): creatinine 1.06, LDL 81, HDL 40 Labs (5/15): K 4.6, creatinine 1.19, LDL 98, HDL 41  ECG: NSR, RBBB  PMH:  1. CAD: s/p CABG in 2007. NSTEMI 1/11 in setting of atrial fibrillation with RVR. LHC at that time showed stable disease: EF 55%, SVG-RCA with 50% distal stenosis, .sequential SVG-OM and ramus with patent OM branch and occluded ramus branch, SVG-D patent, LIMA-LAD patent.  Lexiscan myoview (5/12) with  EF 55%, no ischemia or infarction.  2. Diastolic CHF: Echo (1/06) with EF 50% and inferior hypokinesis, ascending aorta dilated to 4.6 cm, moderate RV dilation and dysfunction.   Echo (1/15) with EF 55-60%, mild LVH, aortic root 4.3 cm.  3. Aortic root aneurysm: Samuel angiogram chest (5/12) showed aortic root 4.5 cm, ascending aorta 4.1 cm, remainder of thoracic aorta normal in caliber.  MRA chest (5/13) with 4.4 cm aortic root, 4.2 cm ascending aorta.  Cardiac MRI/MRA chest (5/14) with EF 50%, mid to apical anterior hypokinesis, trileaflet aortic valve, 4.3 cm aortic root, 4.2 cm ascending aorta.  MRA chest (6/15) with 4.3 cm aortic root at sinuses of valsalva, 4.2 cm ascending aorta.  4. RLL nodule: stable over 3 years  5. OSA on CPAP  6. Chronic RBBB  7. Paroxysmal atrial fibrillation: Only noted in 1/11. 3 week event monitor in 2/12 with no atrial fibrillation.  8. HTN  9. Hyperlipidemia: Fatigue with simvastatin.  10. GERD  11. BPH  12. PE in 2007 at time of CABG.  13. Syncopal episode in 2/12 (suspect vasovagal). 3-week even monitor showed PVCs, no atrial fibrillation.  14. Gastritis 15. AAA: 3/16 abdominal US with 3.6 cm AAA.   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   FH: No premature CAD   ROS: All systems  reviewed and negative except as noted in HPI.   Current Outpatient Prescriptions  Medication Sig Dispense Refill  . aspirin 325 MG EC tablet Take 325 mg by mouth daily.        . clonazePAM (KLONOPIN) 0.5 MG tablet Take 0.5 mg by mouth as needed.      Marland Kitchen co-enzyme Q-10 30 MG capsule Take 1 capsule (30 mg total) by mouth 3 (three) times daily.      . Coenzyme Q10 200 MG TABS Take by mouth.    0  . Docusate Calcium (STOOL SOFTENER PO) Take by mouth as needed.      . fish oil-omega-3 fatty acids 1000 MG capsule Take 1,200 g by mouth daily.       . furosemide (LASIX) 40 MG tablet daily as needed. 1 tablet every other day      . Garlic 401 MG TABS Take 1,000 mg by  mouth daily.       Marland Kitchen lisinopril (PRINIVIL,ZESTRIL) 5 MG tablet TAKE ONE TABLET BY MOUTH ONCE DAILY  30 tablet  11  . metoprolol succinate (TOPROL-XL) 25 MG 24 hr tablet TAKE ONE TABLET BY MOUTH ONCE DAILY ** TAKE EXTRA AS NEEDED FOR PALPITATIONS. ** NEED TO CALL AND MAKE APPOINTMENT  45 tablet  11  . Multiple Vitamin (MULTIVITAMIN) tablet Take 1 tablet by mouth daily.        Marland Kitchen omeprazole (PRILOSEC) 20 MG capsule Take 1 capsule by mouth daily.      . rosuvastatin (CRESTOR) 5 MG tablet Take 1 tablet (5 mg total) by mouth daily.  30 tablet  11  . Tamsulosin HCl (FLOMAX) 0.4 MG CAPS Take 0.4 mg by mouth daily.        . nitroGLYCERIN (NITROSTAT) 0.4 MG SL tablet Place 1 tablet (0.4 mg total) under the tongue every 5 (five) minutes as needed for chest pain.  25 tablet  prn   No current facility-administered medications for this visit.    BP 126/86  Pulse 80  Ht 5\' 11"  (1.803 m)  Wt 238 lb (107.956 kg)  BMI 33.21 kg/m2 General: NAD, obese  Neck: JVP 7 cm, no thyromegaly or thyroid nodule.  Lungs: Clear to auscultation bilaterally with normal respiratory effort.  CV: Nondisplaced PMI. Heart regular S1/S2, no S3/S4, no murmur. No edema. No carotid bruit. 2+ PT pulses bilaterally.  Abdomen: Soft, nontender, no hepatosplenomegaly, no distention.  Neurologic: Alert and oriented x 3.  Psych: Normal affect.  Extremities: No clubbing or cyanosis.   Assessment/Plan:  Aortic aneurysm  Stable 4.3 cm aortic root. Repeat MRA chest in 6/16. Continue BP control.  ATRIAL FIBRILLATION  Paroxysmal atrial fibrillation with only 1 known episode in 1/11. No atrial fibrillation on 3-week event monitor in 2/12. In the past, I told him that it would be safest, given his risk factor profile, to be anticoagulated with apixaban, Xarelto, or coumadin. He declined anticoagulation. He is on ASA. If he has another atrial fibrillation episode, I will again strongly encourage anticoagulation.  CAD  CAD s/p CABG. He has had  atypical chest pain recently.  I am going to arrange for [employer]. Continue ASA, Toprol XL, and lisinopril.  DYSPNEA Chronic, not taking Lasix because he does not think it helps.  Weight is stable.  He does not look volume overloaded today. EF normal on echo in 1/15.  He says that his PCP tested him for COPD and that he does not have COPD.   HYPERLIPIDEMIA He has tolerated Crestor, which  was recently increased to 5 mg daily.  I will get lipids/LFTs today.   HYPERTENSION  BP normal today.   Loralie Champagne 07/25/2014

## 2014-07-26 ENCOUNTER — Encounter: Payer: Self-pay | Admitting: Cardiovascular Disease

## 2014-07-28 ENCOUNTER — Ambulatory Visit (HOSPITAL_BASED_OUTPATIENT_CLINIC_OR_DEPARTMENT_OTHER): Payer: Medicare Other | Admitting: Radiology

## 2014-07-28 ENCOUNTER — Ambulatory Visit (HOSPITAL_COMMUNITY)
Admission: RE | Admit: 2014-07-28 | Discharge: 2014-07-28 | Disposition: A | Discharge status: Home / Self Care | Payer: Medicare Other | Source: Ambulatory Visit | Attending: Orthopaedic Surgery | Admitting: Orthopaedic Surgery

## 2014-07-28 VITALS — BP 148/95 | HR 75 | Ht 71.0 in | Wt 240.0 lb

## 2014-07-28 DIAGNOSIS — M5137 Other intervertebral disc degeneration, lumbosacral region: Secondary | ICD-10-CM | POA: Insufficient documentation

## 2014-07-28 DIAGNOSIS — Z951 Presence of aortocoronary bypass graft: Secondary | ICD-10-CM | POA: Insufficient documentation

## 2014-07-28 DIAGNOSIS — R0989 Other specified symptoms and signs involving the circulatory and respiratory systems: Secondary | ICD-10-CM | POA: Insufficient documentation

## 2014-07-28 DIAGNOSIS — I4891 Unspecified atrial fibrillation: Secondary | ICD-10-CM | POA: Diagnosis not present

## 2014-07-28 DIAGNOSIS — Z87891 Personal history of nicotine dependence: Secondary | ICD-10-CM | POA: Diagnosis not present

## 2014-07-28 DIAGNOSIS — I2510 Atherosclerotic heart disease of native coronary artery without angina pectoris: Secondary | ICD-10-CM | POA: Insufficient documentation

## 2014-07-28 DIAGNOSIS — M545 Low back pain, unspecified: Secondary | ICD-10-CM | POA: Insufficient documentation

## 2014-07-28 DIAGNOSIS — M48061 Spinal stenosis, lumbar region without neurogenic claudication: Secondary | ICD-10-CM | POA: Diagnosis not present

## 2014-07-28 DIAGNOSIS — R079 Chest pain, unspecified: Secondary | ICD-10-CM | POA: Insufficient documentation

## 2014-07-28 DIAGNOSIS — I10 Essential (primary) hypertension: Secondary | ICD-10-CM | POA: Insufficient documentation

## 2014-07-28 DIAGNOSIS — M5126 Other intervertebral disc displacement, lumbar region: Secondary | ICD-10-CM | POA: Insufficient documentation

## 2014-07-28 DIAGNOSIS — M5442 Lumbago with sciatica, left side: Secondary | ICD-10-CM

## 2014-07-28 DIAGNOSIS — R0609 Other forms of dyspnea: Secondary | ICD-10-CM | POA: Diagnosis not present

## 2014-07-28 DIAGNOSIS — M51379 Other intervertebral disc degeneration, lumbosacral region without mention of lumbar back pain or lower extremity pain: Secondary | ICD-10-CM | POA: Insufficient documentation

## 2014-07-28 MED ORDER — TECHNETIUM TC 99M SESTAMIBI GENERIC - CARDIOLITE
11.0000 | Freq: Once | INTRAVENOUS | Status: AC | PRN
  Administered 2014-07-28: 11 via INTRAVENOUS

## 2014-07-28 MED ORDER — TECHNETIUM TC 99M SESTAMIBI GENERIC - CARDIOLITE
33.0000 | Freq: Once | INTRAVENOUS | Status: AC | PRN
  Administered 2014-07-28: 33 via INTRAVENOUS

## 2014-07-28 MED ORDER — REGADENOSON 0.4 MG/5ML IV SOLN
0.4000 mg | Freq: Once | INTRAVENOUS | Status: AC
  Administered 2014-07-28: 0.4 mg via INTRAVENOUS

## 2014-07-28 NOTE — Progress Notes (Addendum)
[facility] [address] [place], [place] [address] [phone]    Cardiology Nuclear Med Study  [name] is a 73 y.o. male     MRN : [id number]     DOB: Feb 29, 1941  Procedure Date: 07/28/2014  Nuclear Med Background Indication for Stress Test:  Evaluation for Ischemia and Graft Patency History:  CAD, MI, CABG, Afib, Echo EF 50%, MPI 2012 (normal) EF 55% Cardiac Risk Factors: History of Smoking, Hypertension, Lipids, PVD and RBBB  Symptoms:  Chest Pain and DOE   Nuclear Pre-Procedure Caffeine/Decaff Intake:  None NPO After: 6 pm   Lungs:  clear O2 Sat: 94% on room air. IV 0.9% NS with Angio Cath:  22g  IV Site: L Hand  IV Started by:  Crissie Figures, RN  Chest Size (in):  48 Cup Size: n/a  Height: 5\' 11"  (1.803 m)  Weight:  240 lb (108.863 kg)  BMI:  Body mass index is 33.49 kg/(m^2). Tech Comments:  N/A    Nuclear Med Study 1 or 2 day study: 1 day  Stress Test Type:  Lexiscan  Reading MD: N/A  Order Authorizing Provider:  Loralie Champagne, MD  Resting Radionuclide: Technetium 30m Sestamibi  Resting Radionuclide Dose: 11.0 mCi   Stress Radionuclide:  Technetium 75m Sestamibi  Stress Radionuclide Dose: 33.0 mCi           Stress Protocol Rest HR: 75 Stress HR: 107  Rest BP: 148/95 Stress BP: 118/80  Exercise Time (min): n/a METS: n/a           Dose of Adenosine (mg):  n/a Dose of Lexiscan: 0.4 mg  Dose of Atropine (mg): n/a Dose of Dobutamine: n/a mcg/kg/min (at max HR)  Stress Test Technologist: Glade Lloyd, BS-ES  Nuclear Technologist:  Annye Rusk, CNMT     Rest Procedure:  Myocardial perfusion imaging was performed at rest 45 minutes following the intravenous administration of Technetium 1m Sestamibi. Rest ECG: NSR-RBBB  Stress Procedure:  The patient received IV Lexiscan 0.4 mg over 15-seconds.  Technetium 5m Sestamibi injected at 30-seconds.  Quantitative spect images were obtained after a 45 minute delay.  During the  infusion of Lexiscan the patient complained of SOB, queasiness and feeling weak.  These symptoms began to resolve in recovery.  Stress ECG: No significant change from baseline ECG  QPS Raw Data Images:  Mild diaphragmatic attenuation.  Normal left ventricular size. Stress Images:  There is decreased uptake in the apical anterior wall and in the apex.  Rest Images:  There is decreased uptake in the apical anterior wall and in the apex.  Subtraction (SDS):  No evidence of ischemia. Transient Ischemic Dilatation (Normal <1.22):  0.89 Lung/Heart Ratio (Normal <0.45):  0.32  Quantitative Gated Spect Images QGS EDV:  117 ml QGS ESV:  50 ml  Impression Exercise Capacity:  Lexiscan with no exercise. BP Response:  Normal blood pressure response. Clinical Symptoms:  There is dyspnea. ECG Impression:  No significant ST segment change suggestive of ischemia. Comparison with Prior Nuclear Study: No images to compare  Overall Impression:  Low risk stress nuclear study with a small scar the distal LAD territory with no ischemia..  LV Ejection Fraction: 57%.  LV Wall Motion:  NL LV Function; NL Wall Motion  ,  H 07/28/2014  Low risk, small scar from prior MI but no ischemia.  Normal EF.  Please report to patient.   Loralie Champagne 07/29/2014

## 2014-07-30 NOTE — Progress Notes (Signed)
Pt aware of results by phone.  

## 2014-09-08 ENCOUNTER — Ambulatory Visit (HOSPITAL_COMMUNITY)
Admission: RE | Admit: 2014-09-08 | Discharge: 2014-09-08 | Disposition: A | Discharge status: Home / Self Care | Payer: Medicare Other | Source: Ambulatory Visit | Attending: Orthopaedic Surgery | Admitting: Orthopaedic Surgery

## 2014-09-08 DIAGNOSIS — IMO0001 Reserved for inherently not codable concepts without codable children: Secondary | ICD-10-CM | POA: Insufficient documentation

## 2014-09-08 DIAGNOSIS — R29898 Other symptoms and signs involving the musculoskeletal system: Secondary | ICD-10-CM | POA: Diagnosis not present

## 2014-09-08 DIAGNOSIS — M545 Low back pain, unspecified: Secondary | ICD-10-CM | POA: Insufficient documentation

## 2014-09-08 DIAGNOSIS — Z951 Presence of aortocoronary bypass graft: Secondary | ICD-10-CM | POA: Diagnosis not present

## 2014-09-08 DIAGNOSIS — R293 Abnormal posture: Secondary | ICD-10-CM | POA: Insufficient documentation

## 2014-09-08 NOTE — Evaluation (Signed)
Physical Therapy Evaluation  Patient Details  Name: [name] MRN: [id number] Date of Birth: [birth year]  Today's Date: 09/08/2014 Time: 0803-0846 PT Time Calculation (min): 43 min Charge:  Evaluation              Visit#: 1 of 8  Re-eval: 10/08/14 Assessment Diagnosis: radicular low back pain   Authorization: [employer] medicare     Authorization Visit#: 1 of 8   Past Medical History:  Past Medical History  Diagnosis Date  . CAD (coronary artery disease)     s/p CABG 2007; NSTEMI in setting of AFib with RVR 12/2009; cath 1/11: S-RCA ok with prox 30-40% and 50% stenoses; S-OM and RI  with patent OM limb but an occluded RI limb; S-D2 and L-LAD ok; EF 55%  . HTN (hypertension)   . Diastolic dysfunction     Echo 1/11:  EF 55-60%; mild LVH; focal basal septal hypertrophy; grade 1 diast.dysfxn; mild BAE and RVE  . GERD (gastroesophageal reflux disease)   . Colon cancer 1992  . BPH (benign prostatic hyperplasia)   . PE (pulmonary embolism) 2007    after CABG  . Lung granuloma     Right upper  . OSA on CPAP   . Dyspnea   . Atrial fibrillation     previously on Multaq  . RBBB (right bundle branch block)   . Hyperlipidemia   . Personal history of colonic polyps 01/22/2000    TUBULAR ADENOMA  . Diverticulosis    Past Surgical History:  Past Surgical History  Procedure Laterality Date  . Colectomy  1992    w/ colostomy  related to colon cancer  . Inguinal hernia repair    . Laparoscopic cholecystectomy  2004  . Coronary artery bypass graft  2007  . Ventral hernia repair  2009    Subjective Symptoms/Limitations Symptoms: Pt states that he has had low back pian with radiculopathy for six weeks.  He states the pain is greater when he sits and he will have tingling in his Lt foot.    How long can you sit comfortably?: immediate tingling  How long can you stand comfortably?: no problem  How long can you walk comfortably?: no problem  Pain Assessment Currently in  Pain?: No/denies (worst is mainly tingling pain 3/10)  Balance Screening  Denies falls   Prior Functioning  Prior Function Vocation: Retired Leisure: Hobbies-no  Cognition/Observation  Pt stands and walks  with forward bent position.   Sensation/Coordination/Flexibility/Functional Tests Flexibility Thomas: Positive 90/90: Positive Functional Tests Functional Tests: foto 79  Assessment RLE Strength Right Hip Flexion: 5/5 Right Hip Extension: 5/5 Right Hip ABduction: 5/5 Right Knee Flexion: 5/5 Right Knee Extension: 5/5 Right Ankle Dorsiflexion: 5/5 LLE Strength Left Hip Flexion: 5/5 Left Hip Extension: 5/5 Left Hip ABduction: 5/5 Left Knee Flexion: 5/5 Left Knee Extension: 3+/5 Left Ankle Dorsiflexion: 4/5 Lumbar AROM Lumbar Flexion: NT  Lumbar Extension: decreased 20% Lumbar - Right Side Bend: wfl Lumbar - Left Side Bend: decreased 20% Lumbar - Right Rotation: decreased 50% Lumbar - Left Rotation: decreased 20%   Exercise/Treatments Mobility/Balance  Posture/Postural Control Posture/Postural Control: Postural limitations Postural Limitations: forward head, increased kyphosis and decrease lordosis    Stretches Active Hamstring Stretch: 3 reps;30 seconds;Limitations Active Hamstring Stretch Limitations: supine Single Knee to Chest Stretch: 2 reps;30 seconds Lower Trunk Rotation: 5 reps   Seated Other Seated Lumbar Exercises: sit tall with scapular retraction  Supine Ab Set: 5 reps Bent Knee Raise: 5 reps  Physical Therapy Assessment and Plan PT Assessment and Plan Clinical Impression Statement: Pt is a 73 yo male who has had numbness upon sitting for the past 6-8 weeks.  Examination demonstrates poor standing posture, poor body mechanics, decreased flexibility, decreased gluteal maximus strength.  MRI shows + discs at multiple levels.  Pt will benefit from skilled PT to improve pt knowledge of back care, address the forementioned findings and begin a  trial of traction to improve pt comfort when sitting.   PT Frequency: Min 2X/week PT Duration: 4 weeks PT Treatment/Interventions: Functional mobility training;Therapeutic activities;Therapeutic exercise;Modalities;Manual techniques;Patient/family education PT Plan: educate pt in lumbar roll when sitting, begin core stengthening including bridging, SLR, prone heel squeeze and prone hip extension making sure stability of low back is maintained.  Trial of traction static at 70# x 15 minutes.  Progress to decompression exercises, standing posture exercises  and lifting education     Goals Home Exercise Program Pt/caregiver will Perform Home Exercise Program: For increased strengthening PT Goal: Perform Home Exercise Program - Progress: Goal set today PT Short Term Goals Time to Complete Short Term Goals: 2 weeks PT Short Term Goal 1: Pt to state decrease of tingling in Lt foot by 50% PT Short Term Goal 2: Pt to be able to verbalize the importance of posture in back pain  PT Short Term Goal 3: Pt to be able to stand with 10 degrees or less of forward flexion PT Long Term Goals Time to Complete Long Term Goals: 4 weeks PT Long Term Goal 1: Pt I in advance HEP PT Long Term Goal 2: PT to be able to demonstrate proper body mechanics for lifting  Long Term Goal 3: Pt hip extension strength to have improved by one grade  Long Term Goal 4: Pt to state that he has not had any tingling in his foot for the past 7 days  PT Long Term Goal 5: Pt to be able to sit with comfort for an hour and a half to allow travel and going out to eat   Problem List Patient Active Problem List   Diagnosis Date Noted  . Bad posture 09/08/2014  . Leg weakness, bilateral 09/08/2014  . Anemia 03/07/2012  . Other general symptoms 03/07/2012  . GERD (gastroesophageal reflux disease) 03/07/2012  . Sleep apnea 03/07/2012  . Personal history of malignant neoplasm of rectum, rectosigmoid junction, and anus 03/07/2012  . Hx of  CABG 03/07/2012  . Chronic diastolic heart failure 28/31/5176  . Fatigue 04/03/2011  . Aortic aneurysm 04/03/2011  . SYNCOPE 02/21/2011  . PALPITATIONS 02/21/2011  . BRADYCARDIA 01/22/2011  . ATRIAL FIBRILLATION 01/23/2010  . OBSTRUCTIVE SLEEP APNEA 01/16/2010  . DYSPNEA 05/12/2009  . HYPERCHOLESTEROLEMIA 03/22/2009  . HYPERLIPIDEMIA 03/22/2009  . OBESITY 03/22/2009  . HYPERTENSION 03/22/2009  . CAD 03/22/2009  . BUNDLE BRANCH BLOCK, RIGHT 03/22/2009  . GERD 03/22/2009  . BENIGN PROSTATIC HYPERTROPHY, HX OF 03/22/2009    PT Plan of Care PT Home Exercise Plan: given   GP Functional Assessment Tool Used: foto Functional Limitation: Mobility: Walking and moving around Mobility: Walking and Moving Around Current Status ([id number]): At least 20 percent but less than 40 percent impaired, limited or restricted Mobility: Walking and Moving Around Goal Status [phone]): At least 1 percent but less than 20 percent impaired, limited or restricted  ,CINDY 09/08/2014, 10:42 AM  Physician Documentation Your signature is required to indicate approval of the treatment plan as stated above.  Please sign and either send electronically or  make a copy of this report for your files and return this physician signed original.   Please mark one 1.__approve of plan  2. ___approve of plan with the following conditions.   ______________________________                                                          _____________________ Physician Signature                                                                                                             Date

## 2014-09-13 ENCOUNTER — Ambulatory Visit (HOSPITAL_COMMUNITY)
Admission: RE | Admit: 2014-09-13 | Discharge: 2014-09-13 | Disposition: A | Discharge status: Home / Self Care | Payer: Medicare Other | Source: Ambulatory Visit | Attending: Orthopaedic Surgery | Admitting: Orthopaedic Surgery

## 2014-09-13 DIAGNOSIS — IMO0001 Reserved for inherently not codable concepts without codable children: Secondary | ICD-10-CM | POA: Diagnosis not present

## 2014-09-13 DIAGNOSIS — R293 Abnormal posture: Secondary | ICD-10-CM

## 2014-09-13 DIAGNOSIS — R29898 Other symptoms and signs involving the musculoskeletal system: Secondary | ICD-10-CM

## 2014-09-13 NOTE — Progress Notes (Signed)
Physical Therapy Treatment Patient Details  Name: [name] MRN: [id number] Date of Birth: 06-03-1941  Today's Date: 09/13/2014 Time: 1353-1440 PT Time Calculation (min): 47 min Charge there ex 1353-1420; traction 1425-1440  Visit#: 2 of 8  Re-eval: 10/08/14       Authorization Visit#: 2 of 8   Subjective: Symptoms/Limitations Symptoms: Pt states that he was hurting quite a bit over the weekend feels that it may have been do to the weather.       Exercise/Treatments Mobility/Balance        Stretches Active Hamstring Stretch: 3 reps;30 seconds Single Knee to Chest Stretch: 3 reps;30 seconds Standing Other Standing Lumbar Exercises: B UE flexion while standing at wall keeping core tight.     Supine Bent Knee Raise: 10 reps Bridge: 10 reps PRONE  Straight Leg Raise: 10 reps Other Prone Lumbar Exercises: rows, shoulder extension, heel squeeze x 10  Quadruped    Modalities Modalities: Traction Traction Type of Traction: Lumbar Max (lbs): 70 #   Physical Therapy Assessment and Plan PT Assessment and Plan Clinical Impression Statement: Pt states that he had increased pain in his back with traction states that it felt like cramping at end of treatment time.  New exercises were able to be performed using good technique with verbal and manual cues  PT Plan: assess how back felt in the evening  may need to decrease poundage or stop traction all together; (discuss wtih patient)  begin t-band postural exercises     Goals    Problem List Patient Active Problem List   Diagnosis Date Noted  . Bad posture 09/08/2014  . Leg weakness, bilateral 09/08/2014  . Anemia 03/07/2012  . Other general symptoms 03/07/2012  . GERD (gastroesophageal reflux disease) 03/07/2012  . Sleep apnea 03/07/2012  . Personal history of malignant neoplasm of rectum, rectosigmoid junction, and anus 03/07/2012  . Hx of CABG 03/07/2012  . Chronic diastolic heart failure 67/54/4920  .  Fatigue 04/03/2011  . Aortic aneurysm 04/03/2011  . SYNCOPE 02/21/2011  . PALPITATIONS 02/21/2011  . BRADYCARDIA 01/22/2011  . ATRIAL FIBRILLATION 01/23/2010  . OBSTRUCTIVE SLEEP APNEA 01/16/2010  . DYSPNEA 05/12/2009  . HYPERCHOLESTEROLEMIA 03/22/2009  . HYPERLIPIDEMIA 03/22/2009  . OBESITY 03/22/2009  . HYPERTENSION 03/22/2009  . CAD 03/22/2009  . BUNDLE BRANCH BLOCK, RIGHT 03/22/2009  . GERD 03/22/2009  . BENIGN PROSTATIC HYPERTROPHY, HX OF 03/22/2009       GP    RUSSELL,CINDY 09/13/2014, 5:51 PM

## 2014-09-15 ENCOUNTER — Ambulatory Visit (HOSPITAL_COMMUNITY)
Admission: RE | Admit: 2014-09-15 | Discharge: 2014-09-15 | Disposition: A | Discharge status: Home / Self Care | Payer: Medicare Other | Source: Ambulatory Visit | Attending: Orthopaedic Surgery | Admitting: Orthopaedic Surgery

## 2014-09-15 DIAGNOSIS — R293 Abnormal posture: Secondary | ICD-10-CM

## 2014-09-15 DIAGNOSIS — R29898 Other symptoms and signs involving the musculoskeletal system: Secondary | ICD-10-CM

## 2014-09-15 DIAGNOSIS — IMO0001 Reserved for inherently not codable concepts without codable children: Secondary | ICD-10-CM | POA: Diagnosis not present

## 2014-09-15 NOTE — Progress Notes (Signed)
Physical Therapy Treatment Patient Details  Name: [name] MRN: [id number] Date of Birth: 1941/06/10  Today's Date: 09/15/2014 Time: 1525-1622 PT Time Calculation (min): 57 min Charge: TE 5093-2671, Lumbar traction [phone] (set up, traction, and break down)  Visit#: 3 of 8  Re-eval: 10/08/14 Assessment Diagnosis: radicular low back pain  Next MD Visit: Durward Fortes unscheduled  Authorization: united [employer] medicare   Authorization Time Period:    Authorization Visit#: 3 of 8   Subjective: Symptoms/Limitations Symptoms: Pt reported no real pain today, back feels good when he has pain it is in his Lt hip that can get up to 5/10 tends to be at night.  Pt stated he felt cramp at bottom of spine, no reports of radicular symptoms following.   Pain Assessment Currently in Pain?: No/denies  Objective:   Exercise/Treatments Standing Scapular Retraction: Both;15 reps;Theraband Theraband Level (Scapular Retraction): Level 3 (Green) Row: Both;15 reps;Theraband Theraband Level (Row): Level 3 (Green) Shoulder Extension: 15 reps;Theraband Theraband Level (Shoulder Extension): Level 3 (Green) Seated Other Seated Lumbar Exercises: sit tall with scapular retraction  Other Seated Lumbar Exercises: 3D thoracic excursion to improve extension 5x Prone  Straight Leg Raise: 15 reps Other Prone Lumbar Exercises: rows, shoulder extension x 10   Modalities Modalities: Traction Traction Type of Traction: Lumbar Max (lbs): 70 #  Hold Time: static  Physical Therapy Assessment and Plan PT Assessment and Plan Clinical Impression Statement: Pt explained importance of posture and proper landmarks, begin postural strengtheing with theraband with good technique following demonstration and cueing for proper technique.  Added 3D thoracic excursion to improve thoracic extension to improve posture.  Pt able to demonstrate appropriate techniques with all exercises with min cueing for stability.   Continued lumbar traction with no c/o traction, pt requested same pull weight this session.   PT Plan: assess how back felt in the evening  may need to decrease poundage or stop traction all together; (discuss wtih patient)  Progress current PT POC.      Goals PT Short Term Goals PT Short Term Goal 1: Pt to state decrease of tingling in Lt foot by 50% PT Short Term Goal 1 - Progress: Progressing toward goal PT Short Term Goal 2: Pt to be able to verbalize the importance of posture in back pain  PT Short Term Goal 2 - Progress: Progressing toward goal PT Short Term Goal 3: Pt to be able to stand with 10 degrees or less of forward flexion PT Short Term Goal 3 - Progress: Progressing toward goal PT Long Term Goals PT Long Term Goal 1: Pt I in advance HEP PT Long Term Goal 2: PT to be able to demonstrate proper body mechanics for lifting  Long Term Goal 3: Pt hip extension strength to have improved by one grade  Long Term Goal 3 Progress: Progressing toward goal Long Term Goal 4: Pt to state that he has not had any tingling in his foot for the past 7 days  PT Long Term Goal 5: Pt to be able to sit with comfort for an hour and a half to allow travel and going out to eat   Problem List Patient Active Problem List   Diagnosis Date Noted  . Bad posture 09/08/2014  . Leg weakness, bilateral 09/08/2014  . Anemia 03/07/2012  . Other general symptoms 03/07/2012  . GERD (gastroesophageal reflux disease) 03/07/2012  . Sleep apnea 03/07/2012  . Personal history of malignant neoplasm of rectum, rectosigmoid junction, and anus 03/07/2012  . Hx  of CABG 03/07/2012  . Chronic diastolic heart failure 59/56/3875  . Fatigue 04/03/2011  . Aortic aneurysm 04/03/2011  . SYNCOPE 02/21/2011  . PALPITATIONS 02/21/2011  . BRADYCARDIA 01/22/2011  . ATRIAL FIBRILLATION 01/23/2010  . OBSTRUCTIVE SLEEP APNEA 01/16/2010  . DYSPNEA 05/12/2009  . HYPERCHOLESTEROLEMIA 03/22/2009  . HYPERLIPIDEMIA 03/22/2009  .  OBESITY 03/22/2009  . HYPERTENSION 03/22/2009  . CAD 03/22/2009  . BUNDLE BRANCH BLOCK, RIGHT 03/22/2009  . GERD 03/22/2009  . BENIGN PROSTATIC HYPERTROPHY, HX OF 03/22/2009    PT - End of Session Activity Tolerance: Patient tolerated treatment well General Behavior During Therapy: [employer] for tasks assessed/performed  GP    Aldona Lento 09/15/2014, 4:32 PM

## 2014-09-17 ENCOUNTER — Telehealth (HOSPITAL_COMMUNITY): Payer: Self-pay

## 2014-09-17 ENCOUNTER — Ambulatory Visit (HOSPITAL_COMMUNITY): Payer: Medicare Other | Admitting: Physical Therapy

## 2014-09-17 NOTE — Telephone Encounter (Signed)
cx all future appointments - he doesn't think that therapy is helping him .Marland Kitchen... Will continue to do his exercises at home

## 2014-09-20 ENCOUNTER — Ambulatory Visit (HOSPITAL_COMMUNITY): Payer: Medicare Other | Admitting: Physical Therapy

## 2014-09-22 ENCOUNTER — Ambulatory Visit (HOSPITAL_COMMUNITY): Payer: Medicare Other | Admitting: Physical Therapy

## 2014-09-27 ENCOUNTER — Ambulatory Visit (HOSPITAL_COMMUNITY): Payer: Medicare Other | Admitting: Physical Therapy

## 2014-09-29 ENCOUNTER — Ambulatory Visit (HOSPITAL_COMMUNITY): Payer: Medicare Other | Admitting: Physical Therapy

## 2014-10-04 ENCOUNTER — Ambulatory Visit (HOSPITAL_COMMUNITY): Payer: Medicare Other | Admitting: Physical Therapy

## 2014-10-06 ENCOUNTER — Ambulatory Visit (HOSPITAL_COMMUNITY): Payer: Medicare Other

## 2014-10-11 ENCOUNTER — Ambulatory Visit (HOSPITAL_COMMUNITY): Payer: Medicare Other | Admitting: Physical Therapy

## 2014-10-13 ENCOUNTER — Ambulatory Visit (HOSPITAL_COMMUNITY): Payer: Medicare Other | Admitting: Physical Therapy

## 2014-11-24 ENCOUNTER — Ambulatory Visit (INDEPENDENT_AMBULATORY_CARE_PROVIDER_SITE_OTHER): Payer: Medicare Other | Admitting: *Deleted

## 2014-11-24 ENCOUNTER — Telehealth: Payer: Self-pay | Admitting: Pulmonary Disease

## 2014-11-24 ENCOUNTER — Encounter: Payer: Self-pay | Admitting: Cardiology

## 2014-11-24 ENCOUNTER — Ambulatory Visit (INDEPENDENT_AMBULATORY_CARE_PROVIDER_SITE_OTHER): Payer: Medicare Other | Admitting: Cardiology

## 2014-11-24 VITALS — BP 124/70 | HR 73 | Ht 71.0 in | Wt 240.0 lb

## 2014-11-24 DIAGNOSIS — I2510 Atherosclerotic heart disease of native coronary artery without angina pectoris: Secondary | ICD-10-CM

## 2014-11-24 DIAGNOSIS — I719 Aortic aneurysm of unspecified site, without rupture: Secondary | ICD-10-CM

## 2014-11-24 DIAGNOSIS — I480 Paroxysmal atrial fibrillation: Secondary | ICD-10-CM

## 2014-11-24 DIAGNOSIS — G4733 Obstructive sleep apnea (adult) (pediatric): Secondary | ICD-10-CM

## 2014-11-24 DIAGNOSIS — R0602 Shortness of breath: Secondary | ICD-10-CM

## 2014-11-24 DIAGNOSIS — I5032 Chronic diastolic (congestive) heart failure: Secondary | ICD-10-CM

## 2014-11-24 DIAGNOSIS — Z23 Encounter for immunization: Secondary | ICD-10-CM

## 2014-11-24 NOTE — Telephone Encounter (Signed)
Pt last seen by VS 01/21/2014.[name]

## 2014-11-24 NOTE — Patient Instructions (Signed)
Dr Aundra Dubin recommends that you try using your CPAP again.  Dr Aundra Dubin recommends that you walk 20-30 minutes a day most days of the week.   You have been referred to Dr Lamonte Sakai at [facility] for evaluation of your shortness of breath.  Your physician wants you to follow-up in: 6 months with Dr Aundra Dubin. (June 2016). You will receive a reminder letter in the mail two months in advance. If you don't receive a letter, please call our office to schedule the follow-up appointment.

## 2014-11-24 NOTE — Telephone Encounter (Signed)
VS and RB please advise if ok to schedule appt with RB for consult.  thanks

## 2014-11-24 NOTE — Telephone Encounter (Signed)
LMTCB

## 2014-11-24 NOTE — Progress Notes (Signed)
Patient ID: [name], male   DOB: 26-Sep-1941, 73 y.o.   MRN: [id number] PCP: Dr. Kenton Kingfisher   This is a 73 yo with complex history including CAD s/p CABG, paroxysmal atrial fibrillation, PVCs, chronic dyspnea, aortic root aneurysm, and diastolic CHF who presents for cardiology followup. Lexiscan myoview in 4/12 showed EF 55% and no ischemia or infarction.  Most recent MR angiogram of the chest in 6/15 showed stable aortic root dilation at 4.3 cm.  He had a Lexiscan Cardiolite in 8/15 showing small area of scar in the distal LAD territory, no ischemia, EF 57%, low risk overall.   Patient had atrial fibrillation with RVR in 1/11 converted back to NSR spontaneously. Patient was on Pradaxa for a while but stopped it. He has had no documented atrial fibrillation recurrence.   Mr [name] continues to have exertional dyspnea.  He notes it with walking up stairs or up a hill. He can walk about 1/2 mile on flat ground. He had tried taking Lasix regularly in the past but this did not help so he stopped it.  He now takes Lasix about once a month when he notes swelling.  He has tried inhalers in the past but these did not help so he stopped them.  He says that his PCP did a "breathing test" and told him that he did not have COPD.  No chest pain or NTG use.  He is not using CPAP.   Labs (2/12): K 4.2, creatinine 1.07  Labs (5/12): HCT 44.6, TSH normal, LDL 78, HDL 34, K 4.2, creatinine 1.1 Labs (12/12): BNP 56 Labs (5/13): K 3.7, creatinine 1.05 Labs (10/13): K 4.6, creatinine 1.15, LDL 98, HDL 33 Labs (7/14): creatinine 1.06, LDL 81, HDL 40 Labs (5/15): K 4.6, creatinine 1.19, LDL 98, HDL 41 Labs (8/15): LDL 59, HDL 39  ECG: NSR, RBBB  PMH:  1. CAD: s/p CABG in 2007. NSTEMI 1/11 in setting of atrial fibrillation with RVR. LHC at that time showed stable disease: EF 55%, SVG-RCA with 50% distal stenosis, .sequential SVG-OM and ramus with patent OM branch and occluded ramus branch, SVG-D patent, LIMA-LAD  patent.  Lexiscan myoview (5/12) with EF 55%, no ischemia or infarction.  Lexiscan Cardiolite (8/15) with small scar in distal LAD territory, EF 57%, no ischemia.  2. Diastolic CHF: Echo (4/33) with EF 50% and inferior hypokinesis, ascending aorta dilated to 4.6 cm, moderate RV dilation and dysfunction.   Echo (1/15) with EF 55-60%, mild LVH, aortic root 4.3 cm.  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  MRA chest (5/13) with 4.4 cm aortic root, 4.2 cm ascending aorta.  Cardiac MRI/MRA chest (5/14) with EF 50%, mid to apical anterior hypokinesis, trileaflet aortic valve, 4.3 cm aortic root, 4.2 cm ascending aorta.  MRA chest (6/15) with 4.3 cm aortic root at sinuses of valsalva, 4.2 cm ascending aorta.  4. RLL nodule: stable over 3 years  5. OSA: Not using CPAP.  6. Chronic RBBB  7. Paroxysmal atrial fibrillation: Only noted in 1/11. 3 week event monitor in 2/12 with no atrial fibrillation.  8. HTN  9. Hyperlipidemia: Fatigue with simvastatin.  10. GERD  11. BPH  12. PE in 2007 at time of CABG.  13. Syncopal episode in 2/12 (suspect vasovagal). 3-week even monitor showed PVCs, no atrial fibrillation.  14. Gastritis 15. AAA: 3/16 abdominal US with 3.6 cm AAA.  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   FH: No premature CAD   ROS: All systems reviewed and negative except as noted in HPI.   Current Outpatient Prescriptions  Medication Sig Dispense Refill  . aspirin 325 MG EC tablet Take 325 mg by mouth daily.      . fish oil-omega-3 fatty acids 1000 MG capsule Take 1,200 g by mouth daily.     . Garlic 937 MG TABS Take 1,000 mg by mouth daily.     Marland Kitchen lisinopril (PRINIVIL,ZESTRIL) 5 MG tablet TAKE ONE TABLET BY MOUTH ONCE DAILY 30 tablet 11  . metoprolol succinate (TOPROL-XL) 25 MG 24 hr tablet TAKE ONE TABLET BY MOUTH ONCE DAILY ** TAKE EXTRA AS NEEDED FOR  PALPITATIONS. ** NEED TO CALL AND MAKE APPOINTMENT 45 tablet 11  . Multiple Vitamin (MULTIVITAMIN) tablet Take 1 tablet by mouth daily.      . nitroGLYCERIN (NITROSTAT) 0.4 MG SL tablet Place 1 tablet (0.4 mg total) under the tongue every 5 (five) minutes as needed for chest pain. 25 tablet prn  . omeprazole (PRILOSEC) 20 MG capsule Take 1 capsule by mouth daily.    . rosuvastatin (CRESTOR) 5 MG tablet Take 1 tablet (5 mg total) by mouth daily. 30 tablet 11  . Tamsulosin HCl (FLOMAX) 0.4 MG CAPS Take 0.4 mg by mouth daily.       No current facility-administered medications for this visit.    BP 124/70 mmHg  Pulse 73  Ht 5\' 11"  (1.803 m)  Wt 240 lb (108.863 kg)  BMI 33.49 kg/m2 General: NAD, obese  Neck: JVP 7 cm, no thyromegaly or thyroid nodule.  Lungs: Clear to auscultation bilaterally with normal respiratory effort.  CV: Nondisplaced PMI. Heart regular S1/S2, no S3/S4, no murmur. No edema. No carotid bruit. 2+ PT pulses bilaterally.  Abdomen: Soft, nontender, no hepatosplenomegaly, no distention.  Neurologic: Alert and oriented x 3.  Psych: Normal affect.  Extremities: No clubbing or cyanosis.   Assessment/Plan:  Aortic aneurysm  Stable 4.3 cm aortic root. Repeat MRA chest in 6/16. Continue BP control.  ATRIAL FIBRILLATION  Paroxysmal atrial fibrillation with only 1 documented episode in 1/11. No atrial fibrillation on 3-week event monitor in 2/12. In the past, I told him that it would be safest, given his risk factor profile, to be anticoagulated with apixaban, Xarelto, or coumadin. He declined anticoagulation. He is on ASA. If he has another atrial fibrillation episode, I will again strongly encourage anticoagulation.  CAD  CAD s/p CABG. No ischemia on Lexiscan Cardiolite in 8/15.  No recent chest pain.  Continue ASA, Toprol XL, and lisinopril.  DYSPNEA Chronic, not taking Lasix because he does not think it helps.  Weight is stable.  He does not look volume overloaded today but  will get BNP. EF normal on echo in 1/15.  He says that his PCP tested him for COPD and that he does not have COPD.  It is possible that this is mainly due to obesity/deconditioning.  I would like him to go back for a pulmonary evaluation.  I would also like him to restart CPAP. He needs to be more active, I asked him to try to walk for 20-30 minutes 5-6 times/week.  HYPERLIPIDEMIA Good lipids in 8/15 on current Crestor.  HYPERTENSION  BP normal today.  OSA I would like him to restart CPAP.   Loralie Champagne 11/24/2014

## 2014-11-24 NOTE — Telephone Encounter (Signed)
Please determine what prompted the switch of physicians.  Please confirm that Dr. Aundra Dubin is aware that I have previously seen the patient.  I did see him for sleep apnea only, and he is now being referred for dyspnea.

## 2014-11-25 NOTE — Telephone Encounter (Signed)
ATC sharon at # proved and line rings busy x 5 wcb

## 2014-11-25 NOTE — Telephone Encounter (Signed)
Called Cardiology, spoke with Ivin Booty. States that the patient has made this request for the switch from VS to another physician.  Will send to Dr Halford Chessman to make aware.

## 2014-11-25 NOTE — Telephone Encounter (Signed)
Im happy to see him in February if the patient is OK waiting that long.

## 2014-11-25 NOTE — Telephone Encounter (Signed)
That's fine

## 2014-11-25 NOTE — Telephone Encounter (Signed)
Saline x 1 for Ivin Booty at Cardiology.  WE can schedule pt pulmonary consult with RB but his first available consult is Feb 2016.  PT will need to continue with Dr Halford Chessman for sleep apnea because dr Lamonte Sakai is not a sleep physician.

## 2014-11-26 NOTE — Telephone Encounter (Signed)
The # listed is incorrect. The correct # is [phone]. [id number] or Ivin Booty. Reader Bing, CMA

## 2014-11-29 NOTE — Telephone Encounter (Signed)
I spoke with Ivin Booty and appt is set for 01-24-14 at 2:15, she will advise the pt. Waverly Bing, CMA

## 2015-01-24 ENCOUNTER — Institutional Professional Consult (permissible substitution): Payer: Medicare Other | Admitting: Emergency Medicine

## 2015-03-16 ENCOUNTER — Encounter: Payer: Self-pay | Admitting: Internal Medicine

## 2015-05-17 ENCOUNTER — Ambulatory Visit (INDEPENDENT_AMBULATORY_CARE_PROVIDER_SITE_OTHER): Payer: Medicare Other | Admitting: Emergency Medicine

## 2015-05-17 ENCOUNTER — Encounter: Payer: Self-pay | Admitting: Emergency Medicine

## 2015-05-17 VITALS — BP 120/66 | HR 78 | Ht 71.0 in | Wt 246.0 lb

## 2015-05-17 DIAGNOSIS — G4733 Obstructive sleep apnea (adult) (pediatric): Secondary | ICD-10-CM | POA: Diagnosis not present

## 2015-05-17 DIAGNOSIS — R0602 Shortness of breath: Secondary | ICD-10-CM | POA: Diagnosis not present

## 2015-05-17 NOTE — Patient Instructions (Signed)
We will perform full pulmonary function testing to compare with your study from February 2011 Walking oximetry today We will revisit possibly changing your CPAP mask to nasal pillows at your next visit. This may help you wear the mask more reliably Depending on your pulmonary function testing we will decide whether to try an inhaled medication to help your airflow.  We will remain in communication with Dr. Aundra Dubin. Depending on our results he may need to discuss further cardiac evaluation, including cardiac catheterization Follow with Dr. Lamonte Sakai next available with full PFT

## 2015-05-17 NOTE — Progress Notes (Signed)
Subjective:    Patient ID: [name], male    DOB: 03/04/1941, 74 y.o.   MRN: [id number]  HPI 74 year old former smoker (45-pack-years) with a history of coronary artery disease/CABG, atrial fibrillation, obstructive sleep apnea unable to tolerate CPAP. He is referred today for dyspnea. Has been longstanding, worse over 5 years. I reviewed his cardiology notes, prior PFT from 02/07/10 that show mild AFL, hyperinflation. He underwent a cardiac stress test 07/29/14 > reassuring.  He experiences exertional SOB, minimal cough, no wheeze. He has put on some wt about 10 lbs over 6 month. He still gardens, walks.    Review of Systems  Constitutional: Negative for fever and unexpected weight change.  HENT: Positive for congestion. Negative for dental problem, ear pain, nosebleeds, postnasal drip, rhinorrhea, sinus pressure, sneezing, sore throat and trouble swallowing.   Eyes: Negative for redness and itching.  Respiratory: Positive for shortness of breath. Negative for cough, chest tightness and wheezing.   Cardiovascular: Negative for palpitations and leg swelling.  Gastrointestinal: Negative for nausea and vomiting.  Genitourinary: Negative for dysuria.  Musculoskeletal: Positive for myalgias and joint swelling.  Skin: Negative for rash.  Neurological: Negative for headaches.  Hematological: Does not bruise/bleed easily.  Psychiatric/Behavioral: Negative for dysphoric mood. The patient is not nervous/anxious.    Past Medical History  Diagnosis Date  . CAD (coronary artery disease)     s/p CABG 2007; NSTEMI in setting of AFib with RVR 12/2009; cath 1/11: S-RCA ok with prox 30-40% and 50% stenoses; S-OM and RI  with patent OM limb but an occluded RI limb; S-D2 and L-LAD ok; EF 55%  . HTN (hypertension)   . Diastolic dysfunction     Echo 1/11:  EF 55-60%; mild LVH; focal basal septal hypertrophy; grade 1 diast.dysfxn; mild BAE and RVE  . GERD (gastroesophageal reflux disease)   . Colon  cancer 1992  . BPH (benign prostatic hyperplasia)   . PE (pulmonary embolism) 2007    after CABG  . Lung granuloma     Right upper  . OSA on CPAP   . Dyspnea   . Atrial fibrillation     previously on Multaq  . RBBB (right bundle branch block)   . Hyperlipidemia   . Personal history of colonic polyps 01/22/2000    TUBULAR ADENOMA  . Diverticulosis      Family History  Problem Relation Age of Onset  . Colon cancer Sister   . Lung cancer Mother   . Heart disease Mother   . Breast cancer Sister   . Breast cancer Sister   . Emphysema Father   . Heart disease Father      History   Social History  . Marital Status: Married    Spouse Name: N/A  . Number of Children: 4  . Years of Education: N/A   Occupational History  . 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 Smoking status: Former Smoker -- 1.00 packs/day for 45 years    Types: Cigarettes    Quit date: 12/18/1995  . Smokeless tobacco: Never Used  . Alcohol Use: No     Comment: quit in 1980  . Drug Use: No  . Sexual Activity: Not on file   Other Topics Concern  . Not on file   Social History Narrative   Daily caffeine      No Known Allergies   Outpatient Prescriptions Prior to Visit  Medication Sig Dispense  Refill  . aspirin 325 MG EC tablet Take 325 mg by mouth daily.      . fish oil-omega-3 fatty acids 1000 MG capsule Take 1,200 g by mouth daily.     . Garlic 638 MG TABS Take 1,000 mg by mouth daily.     Marland Kitchen lisinopril (PRINIVIL,ZESTRIL) 5 MG tablet TAKE ONE TABLET BY MOUTH ONCE DAILY 30 tablet 11  . metoprolol succinate (TOPROL-XL) 25 MG 24 hr tablet TAKE ONE TABLET BY MOUTH ONCE DAILY ** TAKE EXTRA AS NEEDED FOR PALPITATIONS. ** NEED TO CALL AND MAKE APPOINTMENT 45 tablet 11  . Multiple Vitamin (MULTIVITAMIN) tablet Take 1 tablet by mouth daily.      . nitroGLYCERIN (NITROSTAT) 0.4 MG SL tablet Place 1 tablet (0.4 mg total) under the tongue every 5 (five) minutes as needed for chest  pain. 25 tablet prn  . omeprazole (PRILOSEC) 20 MG capsule Take 1 capsule by mouth daily.    . rosuvastatin (CRESTOR) 5 MG tablet Take 1 tablet (5 mg total) by mouth daily. 30 tablet 11  . Tamsulosin HCl (FLOMAX) 0.4 MG CAPS Take 0.4 mg by mouth daily.       No facility-administered medications prior to visit.         Objective:   Physical Exam Filed Vitals:   05/17/15 1043  BP: 120/66  Pulse: 78  Height: 5\' 11"  (1.803 m)  Weight: 246 lb (111.585 kg)  SpO2: 95%   Gen: Pleasant, well-nourished, in no distress,  normal affect  ENT: No lesions,  mouth clear,  oropharynx clear, no postnasal drip  Neck: No JVD, no TMG, no carotid bruits  Lungs: No use of accessory muscles, no dullness to percussion, clear without rales or rhonchi  Cardiovascular: RRR, heart sounds normal, no murmur or gallops, no peripheral edema  Musculoskeletal: No deformities, no cyanosis or clubbing  Neuro: alert, non focal  Skin: Warm, no lesions or rashes     Assessment & Plan:  Obstructive sleep apnea Not currently tolerating his CPAP. We discussed the barriers to this and I believe that mask fit as well as the problems. We will investigate nasal pillows in the future.   DYSPNEA Prior primary function testing and imaging reviewed today by me. Certainly he may have progressive obstructive disease contributing to his dyspnea. Consider also some deconditioning with weight gain as well as his known coronary disease. He had a reassuring myocardial stress test less than a year ago and I feel comfortable pursuing pulmonary disease at this time. Depending on the results we'll may decide to refer him back to consider catheterization.   We will perform full pulmonary function testing to compare with your study from February 2011 Walking oximetry today We will revisit possibly changing your CPAP mask to nasal pillows at your next visit. This may help you wear the mask more reliably Depending on your pulmonary  function testing we will decide whether to try an inhaled medication to help your airflow.  We will remain in communication with Dr. Aundra Dubin. Depending on our results he may need to discuss further cardiac evaluation, including cardiac catheterization Follow with Dr. Lamonte Sakai next available with full PFT

## 2015-05-17 NOTE — Assessment & Plan Note (Signed)
Prior primary function testing and imaging reviewed today by me. Certainly he may have progressive obstructive disease contributing to his dyspnea. Consider also some deconditioning with weight gain as well as his known coronary disease. He had a reassuring myocardial stress test less than a year ago and I feel comfortable pursuing pulmonary disease at this time. Depending on the results we'll may decide to refer him back to consider catheterization.   We will perform full pulmonary function testing to compare with your study from February 2011 Walking oximetry today We will revisit possibly changing your CPAP mask to nasal pillows at your next visit. This may help you wear the mask more reliably Depending on your pulmonary function testing we will decide whether to try an inhaled medication to help your airflow.  We will remain in communication with Dr. Aundra Dubin. Depending on our results he may need to discuss further cardiac evaluation, including cardiac catheterization Follow with Dr. Lamonte Sakai next available with full PFT

## 2015-05-17 NOTE — Assessment & Plan Note (Signed)
Not currently tolerating his CPAP. We discussed the barriers to this and I believe that mask fit as well as the problems. We will investigate nasal pillows in the future.

## 2015-07-12 ENCOUNTER — Ambulatory Visit: Payer: Medicare Other | Admitting: Emergency Medicine

## 2015-07-19 ENCOUNTER — Telehealth: Payer: Self-pay | Admitting: *Deleted

## 2015-07-19 NOTE — Telephone Encounter (Signed)
Encounter opened in error

## 2015-07-21 ENCOUNTER — Ambulatory Visit (INDEPENDENT_AMBULATORY_CARE_PROVIDER_SITE_OTHER): Payer: Medicare Other | Admitting: Emergency Medicine

## 2015-07-21 DIAGNOSIS — R0602 Shortness of breath: Secondary | ICD-10-CM

## 2015-07-21 LAB — PULMONARY FUNCTION TEST
DL/VA % PRED: 96 %
DL/VA: 4.49 ml/min/mmHg/L
DLCO UNC % PRED: 76 %
DLCO unc: 25.66 ml/min/mmHg
FEF 25-75 Post: 3.51 L/sec
FEF 25-75 Pre: 3.45 L/sec
FEF2575-%Change-Post: 1 %
FEF2575-%Pred-Post: 151 %
FEF2575-%Pred-Pre: 148 %
FEV1-%Change-Post: 0 %
FEV1-%Pred-Post: 91 %
FEV1-%Pred-Pre: 91 %
FEV1-POST: 2.92 L
FEV1-Pre: 2.93 L
FEV1FVC-%Change-Post: 4 %
FEV1FVC-%Pred-Pre: 113 %
FEV6-%Change-Post: -4 %
FEV6-%PRED-PRE: 85 %
FEV6-%Pred-Post: 80 %
FEV6-Post: 3.35 L
FEV6-Pre: 3.52 L
FEV6FVC-%PRED-POST: 106 %
FEV6FVC-%Pred-Pre: 106 %
FVC-%CHANGE-POST: -4 %
FVC-%PRED-POST: 75 %
FVC-%Pred-Pre: 79 %
FVC-POST: 3.35 L
FVC-PRE: 3.52 L
POST FEV6/FVC RATIO: 100 %
PRE FEV1/FVC RATIO: 83 %
Post FEV1/FVC ratio: 87 %
Pre FEV6/FVC Ratio: 100 %
RV % PRED: 84 %
RV: 2.2 L
TLC % PRED: 82 %
TLC: 5.96 L

## 2015-07-21 NOTE — Progress Notes (Signed)
PFT done today. 

## 2015-08-08 ENCOUNTER — Ambulatory Visit (INDEPENDENT_AMBULATORY_CARE_PROVIDER_SITE_OTHER): Payer: Medicare Other | Admitting: Emergency Medicine

## 2015-08-08 ENCOUNTER — Encounter: Payer: Self-pay | Admitting: Emergency Medicine

## 2015-08-08 VITALS — BP 104/62 | HR 85 | Ht 71.0 in | Wt 241.0 lb

## 2015-08-08 DIAGNOSIS — R0602 Shortness of breath: Secondary | ICD-10-CM | POA: Diagnosis not present

## 2015-08-08 DIAGNOSIS — G4733 Obstructive sleep apnea (adult) (pediatric): Secondary | ICD-10-CM | POA: Diagnosis not present

## 2015-08-08 DIAGNOSIS — J984 Other disorders of lung: Secondary | ICD-10-CM

## 2015-08-08 MED ORDER — ALBUTEROL SULFATE HFA 108 (90 BASE) MCG/ACT IN AERS: 2.0000 | INHALATION_SPRAY | RESPIRATORY_TRACT | Status: DC | PRN

## 2015-08-08 NOTE — Patient Instructions (Signed)
We will perform a CT scan of your chest  We will do a trial of albuterol to see if it benefits your breathing. Please try 2 puffs if need for shortness of breath, or try taking it before exertion to see if your work is easier.  We will ask [facility] to get your a Nasal pillows CPAP mask. You will likely need to use your chin strap with this Start working on increasing your exercise and conditioning, weight loss.  Follow with Dr Lamonte Sakai in 4 months or sooner if you have any problems.

## 2015-08-08 NOTE — Progress Notes (Signed)
Subjective:    Patient ID: [name], male    DOB: [birth year], 74 y.o.   MRN: [id number]  Shortness of Breath Pertinent negatives include no ear pain, fever, headaches, leg swelling, rash, rhinorrhea, sore throat, vomiting or wheezing.   74 year old former smoker (45-pack-years) with a history of coronary artery disease/CABG, atrial fibrillation, obstructive sleep apnea unable to tolerate CPAP. He is referred today for dyspnea. Has been longstanding, worse over 5 years. I reviewed his cardiology notes, prior PFT from 02/07/10 that show mild AFL, hyperinflation. He underwent a cardiac stress test 07/29/14 > reassuring.  He experiences exertional SOB, minimal cough, no wheeze. He has put on some wt about 10 lbs over 6 month. He still gardens, walks.   ROV 08/08/15 -- follows up for dyspnea and hx tobacco, prior PFT with mild obstruction. He underwent repeat PFT on 07/21/15 that I have personally reviewed. These show mild mixed disease, primarily restriction, without a BD response. His lung volumes are normal. DLCO is decreased but corrects for Va.  History is taken from him and his wife. He has had stable exertional dyspnea since last visit. No CP, cough, sputum.   Review of Systems  Constitutional: Negative for fever and unexpected weight change.  HENT: Positive for congestion. Negative for dental problem, ear pain, nosebleeds, postnasal drip, rhinorrhea, sinus pressure, sneezing, sore throat and trouble swallowing.   Eyes: Negative for redness and itching.  Respiratory: Positive for shortness of breath. Negative for cough, chest tightness and wheezing.   Cardiovascular: Negative for palpitations and leg swelling.  Gastrointestinal: Negative for nausea and vomiting.  Genitourinary: Negative for dysuria.  Musculoskeletal: Positive for myalgias and joint swelling.  Skin: Negative for rash.  Neurological: Negative for headaches.  Hematological: Does not bruise/bleed easily.    Psychiatric/Behavioral: Negative for dysphoric mood. The patient is not nervous/anxious.    Past Medical History  Diagnosis Date  . CAD (coronary artery disease)     s/p CABG 2007; NSTEMI in setting of AFib with RVR 12/2009; cath 1/11: S-RCA ok with prox 30-40% and 50% stenoses; S-OM and RI  with patent OM limb but an occluded RI limb; S-D2 and L-LAD ok; EF 55%  . HTN (hypertension)   . Diastolic dysfunction     Echo 1/11:  EF 55-60%; mild LVH; focal basal septal hypertrophy; grade 1 diast.dysfxn; mild BAE and RVE  . GERD (gastroesophageal reflux disease)   . Colon cancer 1992  . BPH (benign prostatic hyperplasia)   . PE (pulmonary embolism) 2007    after CABG  . Lung granuloma     Right upper  . OSA on CPAP   . Dyspnea   . Atrial fibrillation     previously on Multaq  . RBBB (right bundle branch block)   . Hyperlipidemia   . Personal history of colonic polyps 01/22/2000    TUBULAR ADENOMA  . Diverticulosis      Family History  Problem Relation Age of Onset  . Colon cancer Sister   . Lung cancer Mother   . Heart disease Mother   . Breast cancer Sister   . Breast cancer Sister   . Emphysema Father   . Heart disease Father      Social History   Social History  . Marital Status: Married    Spouse Name: N/A  . Number of Children: 4  . Years of Education: N/A   Occupational History  . 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 Smoking status: Former Smoker -- 1.00 packs/day for 45 years    Types: Cigarettes    Quit date: 12/18/1995  . Smokeless tobacco: Never Used  . Alcohol Use: No     Comment: quit in 1980  . Drug Use: No  . Sexual Activity: Not on file   Other Topics Concern  . Not on file   Social History Narrative   Daily caffeine   he has been exposed to animals - hogs, mules.    No Known Allergies   Outpatient Prescriptions Prior to Visit  Medication Sig Dispense Refill  . aspirin 325 MG EC tablet Take 325 mg by mouth  daily.      . fish oil-omega-3 fatty acids 1000 MG capsule Take 1,200 g by mouth daily.     . Garlic 220 MG TABS Take 1,000 mg by mouth daily.     Marland Kitchen lisinopril (PRINIVIL,ZESTRIL) 5 MG tablet TAKE ONE TABLET BY MOUTH ONCE DAILY 30 tablet 11  . metoprolol succinate (TOPROL-XL) 25 MG 24 hr tablet TAKE ONE TABLET BY MOUTH ONCE DAILY ** TAKE EXTRA AS NEEDED FOR PALPITATIONS. ** NEED TO CALL AND MAKE APPOINTMENT 45 tablet 11  . Multiple Vitamin (MULTIVITAMIN) tablet Take 1 tablet by mouth daily.      . nitroGLYCERIN (NITROSTAT) 0.4 MG SL tablet Place 1 tablet (0.4 mg total) under the tongue every 5 (five) minutes as needed for chest pain. 25 tablet prn  . omeprazole (PRILOSEC) 20 MG capsule Take 1 capsule by mouth daily.    . rosuvastatin (CRESTOR) 5 MG tablet Take 1 tablet (5 mg total) by mouth daily. 30 tablet 11  . Tamsulosin HCl (FLOMAX) 0.4 MG CAPS Take 0.4 mg by mouth daily.       No facility-administered medications prior to visit.         Objective:   Physical Exam Filed Vitals:   08/08/15 1207  BP: 104/62  Pulse: 85  Height: 5\' 11"  (1.803 m)  Weight: 241 lb (109.317 kg)  SpO2: 93%   Gen: Pleasant, well-nourished, in no distress,  normal affect  ENT: No lesions,  mouth clear,  oropharynx clear, no postnasal drip  Neck: No JVD, no TMG, no carotid bruits  Lungs: No use of accessory muscles, no dullness to percussion, clear without rales or rhonchi  Cardiovascular: RRR, heart sounds normal, no murmur or gallops, no peripheral edema  Musculoskeletal: No deformities, no cyanosis or clubbing  Neuro: alert, non focal  Skin: Warm, no lesions or rashes     Assessment & Plan:  Obstructive sleep apnea Not currently using CPAP. I will order nasal pillows for him, see if this makes it easier to use.   DYSPNEA His PFT are consistent with mixed disease, probably more restriction than obstruction. We discussed possible causes for restriction. I believe he needs to be imaged - note  last CT chest showed some apical scar, granuloma. Very possible that deconditioning and wt gain are the explanation.   Will repeat Ct chest Trial of albuterol prn to see if he benefits.  Work on an exercise and wt loss routine

## 2015-08-08 NOTE — Assessment & Plan Note (Signed)
His PFT are consistent with mixed disease, probably more restriction than obstruction. We discussed possible causes for restriction. I believe he needs to be imaged - note last CT chest showed some apical scar, granuloma. Very possible that deconditioning and wt gain are the explanation.   Will repeat Ct chest Trial of albuterol prn to see if he benefits.  Work on an exercise and wt loss routine

## 2015-08-08 NOTE — Assessment & Plan Note (Signed)
Not currently using CPAP. I will order nasal pillows for him, see if this makes it easier to use.

## 2015-08-11 ENCOUNTER — Telehealth: Payer: Self-pay | Admitting: Emergency Medicine

## 2015-08-11 NOTE — Telephone Encounter (Signed)
lmtcb x1 w/ spouse 

## 2015-08-12 ENCOUNTER — Ambulatory Visit (INDEPENDENT_AMBULATORY_CARE_PROVIDER_SITE_OTHER)
Admission: RE | Admit: 2015-08-12 | Discharge: 2015-08-12 | Disposition: A | Discharge status: Home / Self Care | Payer: Medicare Other | Source: Ambulatory Visit | Attending: Emergency Medicine | Admitting: Emergency Medicine

## 2015-08-12 DIAGNOSIS — J984 Other disorders of lung: Secondary | ICD-10-CM | POA: Diagnosis not present

## 2015-08-12 NOTE — Telephone Encounter (Signed)
lmtcb for pt.  

## 2015-08-15 ENCOUNTER — Other Ambulatory Visit: Payer: Self-pay | Admitting: Cardiology

## 2015-08-15 NOTE — Telephone Encounter (Signed)
lmtcb

## 2015-08-16 NOTE — Telephone Encounter (Signed)
Pt in lobby to talk to nurse.

## 2015-08-16 NOTE — Telephone Encounter (Signed)
lmtcb for pt.  

## 2015-08-16 NOTE — Telephone Encounter (Signed)
Please inform the patient there is no evidence for scarring or interstitial lung disease on his CT scan of the chest

## 2015-08-16 NOTE — Telephone Encounter (Signed)
Called and left detailed message on voicemail advising patient that we do not have samples of Albuterol and we do not have CT results at this time. Advised patient via voicemail that I would request a cheaper albuterol prescription from Dr. Lamonte Sakai and CT results and will call once I have received response from Dr. Lamonte Sakai  Dr. Lamonte Sakai, please advise.

## 2015-08-16 NOTE — Telephone Encounter (Signed)
Spoke with patient, notified patient that we will call him back when Dr. Lamonte Sakai gets in office this afternoon to discuss results of CT and Albuterol options. Awaiting response from Dr. Lamonte Sakai.

## 2015-08-16 NOTE — Telephone Encounter (Signed)
Pt walked in early this morning.  Pt will come back to see a nurse. Pt wants CT results and samples.

## 2015-08-17 NOTE — Telephone Encounter (Signed)
Called and spoke to pt. Informed pt of the results and recs per RB. Pt verbalized understanding and denied any further questions or concerns at this time.

## 2015-09-08 ENCOUNTER — Ambulatory Visit (INDEPENDENT_AMBULATORY_CARE_PROVIDER_SITE_OTHER): Payer: Medicare Other | Admitting: Nurse Practitioner

## 2015-09-08 ENCOUNTER — Encounter: Payer: Self-pay | Admitting: Nurse Practitioner

## 2015-09-08 ENCOUNTER — Ambulatory Visit (AMBULATORY_SURGERY_CENTER): Payer: Self-pay

## 2015-09-08 VITALS — Ht 71.0 in | Wt 243.6 lb

## 2015-09-08 VITALS — BP 130/80 | HR 81 | Ht 71.0 in | Wt 242.1 lb

## 2015-09-08 DIAGNOSIS — R0600 Dyspnea, unspecified: Secondary | ICD-10-CM | POA: Diagnosis not present

## 2015-09-08 DIAGNOSIS — I2510 Atherosclerotic heart disease of native coronary artery without angina pectoris: Secondary | ICD-10-CM | POA: Diagnosis not present

## 2015-09-08 DIAGNOSIS — I5032 Chronic diastolic (congestive) heart failure: Secondary | ICD-10-CM | POA: Diagnosis not present

## 2015-09-08 DIAGNOSIS — R252 Cramp and spasm: Secondary | ICD-10-CM

## 2015-09-08 DIAGNOSIS — I480 Paroxysmal atrial fibrillation: Secondary | ICD-10-CM | POA: Diagnosis not present

## 2015-09-08 DIAGNOSIS — C189 Malignant neoplasm of colon, unspecified: Secondary | ICD-10-CM

## 2015-09-08 DIAGNOSIS — R7989 Other specified abnormal findings of blood chemistry: Secondary | ICD-10-CM | POA: Diagnosis not present

## 2015-09-08 LAB — LIPID PANEL
Cholesterol: 134 mg/dL (ref 0–200)
HDL: 36.1 mg/dL — ABNORMAL LOW (ref >39.00)
NonHDL: 97.96
Total CHOL/HDL Ratio: 4
Triglycerides: 211 mg/dL — ABNORMAL HIGH (ref 0.0–149.0)
VLDL: 42.2 mg/dL — ABNORMAL HIGH (ref 0.0–40.0)

## 2015-09-08 LAB — BASIC METABOLIC PANEL
BUN: 13 mg/dL (ref 6–23)
CO2: 30 mEq/L (ref 19–32)
Calcium: 9.5 mg/dL (ref 8.4–10.5)
Chloride: 102 mEq/L (ref 96–112)
Creatinine, Ser: 1.12 mg/dL (ref 0.40–1.50)
GFR: 67.92 mL/min (ref >60.00)
Glucose, Bld: 99 mg/dL (ref 70–99)
Potassium: 3.9 mEq/L (ref 3.5–5.1)
Sodium: 138 mEq/L (ref 135–145)

## 2015-09-08 LAB — HEPATIC FUNCTION PANEL
ALT: 22 U/L (ref 0–53)
AST: 19 U/L (ref 0–37)
Albumin: 3.9 g/dL (ref 3.5–5.2)
Alkaline Phosphatase: 43 U/L (ref 39–117)
Bilirubin, Direct: 0.1 mg/dL (ref 0.0–0.3)
Total Bilirubin: 0.5 mg/dL (ref 0.2–1.2)
Total Protein: 7.1 g/dL (ref 6.0–8.3)

## 2015-09-08 LAB — BRAIN NATRIURETIC PEPTIDE: Pro B Natriuretic peptide (BNP): 73 pg/mL (ref 0.0–100.0)

## 2015-09-08 LAB — CBC
HCT: 45 % (ref 39.0–52.0)
Hemoglobin: 14.7 g/dL (ref 13.0–17.0)
MCHC: 32.8 g/dL (ref 30.0–36.0)
MCV: 84.8 fl (ref 78.0–100.0)
Platelets: 193 10*3/uL (ref 150.0–400.0)
RBC: 5.3 Mil/uL (ref 4.22–5.81)
RDW: 15.1 % (ref 11.5–15.5)
WBC: 5.6 10*3/uL (ref 4.0–10.5)

## 2015-09-08 LAB — TSH: TSH: 1.46 u[IU]/mL (ref 0.35–4.50)

## 2015-09-08 LAB — LDL CHOLESTEROL, DIRECT: Direct LDL: 87 mg/dL

## 2015-09-08 LAB — MAGNESIUM: Magnesium: 1.9 mg/dL (ref 1.5–2.5)

## 2015-09-08 MED ORDER — LISINOPRIL 5 MG PO TABS: 5.0000 mg | ORAL_TABLET | Freq: Every day | ORAL | Status: DC

## 2015-09-08 MED ORDER — TAMSULOSIN HCL 0.4 MG PO CAPS: 0.4000 mg | ORAL_CAPSULE | Freq: Every day | ORAL | Status: DC

## 2015-09-08 MED ORDER — FUROSEMIDE 40 MG PO TABS: 40.0000 mg | ORAL_TABLET | Freq: Every day | ORAL | Status: DC | PRN

## 2015-09-08 MED ORDER — ROSUVASTATIN CALCIUM 5 MG PO TABS: 5.0000 mg | ORAL_TABLET | Freq: Every day | ORAL | Status: DC

## 2015-09-08 MED ORDER — SUPREP BOWEL PREP KIT 17.5-3.13-1.6 GM/177ML PO SOLN: 1.0000 | Freq: Once | ORAL | Status: DC

## 2015-09-08 MED ORDER — METOPROLOL SUCCINATE ER 25 MG PO TB24: ORAL_TABLET | ORAL | Status: DC

## 2015-09-08 NOTE — Patient Instructions (Addendum)
We will be checking the following labs today - BNP, BMET, CBC, HPF, lipids, TSH and MG   Medication Instructions:    Continue with your current medicines.   I have sent in your refills today    Testing/Procedures To Be Arranged:  Echocardiogram  Follow-Up:   See Dr. Aundra Dubin in 6 months  Referral to Dr. Cyndia Bent - thoracic aneurysm with recent CT scan.     Other Special Instructions:   Think about what we talked about today  Knee high compression stockings  Restrict your salt Here are my tips to lose weight:  1. Drink only water. You do not need milk, juice, tea, soda or diet soda.  2. Do not eat anything "white". This includes white bread, potatoes, rice or mayo  3. Stay away from fried foods and sweets  4. Your portion should be the size of the palm of your hand.  5. Know what your weaknesses are and avoid.  6. Find an exercise you like and do it every day for 45 to 60 minutes.        Call the [place] office at [phone] if you have any questions, problems or concerns.

## 2015-09-08 NOTE — Progress Notes (Signed)
CARDIOLOGY OFFICE NOTE  Date:  09/08/2015    [name] Date of Birth: 18-Jan-1941 Medical Record #[id number]  PCP:  Shirline Frees, MD  Cardiologist:  Aundra Dubin    Chief Complaint  Patient presents with  . Coronary Artery Disease    9 month check - seen for Dr. Aundra Dubin  . Edema  . Shortness of Breath    History of Present Illness: [name] is a 74 y.o. male who presents today for a follow up visit. Seen for Dr. Aundra Dubin.   Last visit here back in December.   He has a complex history including CAD s/p CABG, paroxysmal atrial fibrillation, PVCs, chronic dyspnea, aortic root aneurysm, and diastolic CHF. Lexiscan myoview in 4/12 showed EF 55% and no ischemia or infarction. Most recent MR angiogram of the chest in 6/15 showed stable aortic root dilation at 4.3 cm. He had a Lexiscan Cardiolite in 8/15 showing small area of scar in the distal LAD territory, no ischemia, EF 57%, low risk overall.   Patient had atrial fibrillation with RVR in 1/11 converted back to NSR spontaneously. Patient was on Pradaxa for a while but stopped it. He has had no documented atrial fibrillation recurrence. He has had persistent exertional dyspnea. He is followed by pulmonary.   Comes back today. Here with his wife. Complains of feet swelling. Needs medicines refilled. Notes he is more short of breath. Has seen pulmonary - had CT scan - this did show little progression of his thoracic aneurysm. His weight and deconditioned status also felt to be playing a role. No chest pain. BP better at home. Getting too much salt. No support stockings being used. He does not exercise regularly. Not dizzy or lightheaded. Taking Lasix sporadically. Stopped his CPAP again - now going to try nasal cannula. Could not afford albuteral inhaler. No palpitations.   PMH:  1. CAD: s/p CABG in 2007. NSTEMI 1/11 in setting of atrial fibrillation with RVR. LHC at that time showed stable disease: EF 55%, SVG-RCA with 50%  distal stenosis, .sequential SVG-OM and ramus with patent OM branch and occluded ramus branch, SVG-D patent, LIMA-LAD patent. Lexiscan myoview (5/12) with EF 55%, no ischemia or infarction. Lexiscan Cardiolite (8/15) with small scar in distal LAD territory, EF 57%, no ischemia.  2. Diastolic CHF: Echo (6/27) with EF 50% and inferior hypokinesis, ascending aorta dilated to 4.6 cm, moderate RV dilation and dysfunction. Echo (1/15) with EF 55-60%, mild LVH, aortic root 4.3 cm.  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 MRA chest (5/13) with 4.4 cm aortic root, 4.2 cm ascending aorta. Cardiac MRI/MRA chest (5/14) with EF 50%, mid to apical anterior hypokinesis, trileaflet aortic valve, 4.3 cm aortic root, 4.2 cm ascending aorta. MRA chest (6/15) with 4.3 cm aortic root at sinuses of valsalva, 4.2 cm ascending aorta.  4. RLL nodule: stable over 3 years  5. OSA: Not using CPAP.  6. Chronic RBBB  7. Paroxysmal atrial fibrillation: Only noted in 1/11. 3 week event monitor in 2/12 with no atrial fibrillation.  8. HTN  9. Hyperlipidemia: Fatigue with simvastatin.  10. GERD  11. BPH  12. PE in 2007 at time of CABG.  13. Syncopal episode in 2/12 (suspect vasovagal). 3-week even monitor showed PVCs, no atrial fibrillation.  14. Gastritis 15. AAA: 3/16 abdominal US with 3.6 cm AAA.    Past Medical History  Diagnosis  Date  . CAD (coronary artery disease)     s/p CABG 2007; NSTEMI in setting of AFib with RVR 12/2009; cath 1/11: S-RCA ok with prox 30-40% and 50% stenoses; S-OM and RI  with patent OM limb but an occluded RI limb; S-D2 and L-LAD ok; EF 55%  . HTN (hypertension)   . Diastolic dysfunction     Echo 1/11:  EF 55-60%; mild LVH; focal basal septal hypertrophy; grade 1 diast.dysfxn; mild BAE and RVE  . GERD (gastroesophageal reflux disease)   . Colon cancer 1992  . BPH  (benign prostatic hyperplasia)   . PE (pulmonary embolism) 2007    after CABG  . Lung granuloma     Right upper  . OSA on CPAP   . Dyspnea   . Atrial fibrillation     previously on Multaq  . RBBB (right bundle branch block)   . Hyperlipidemia   . Personal history of colonic polyps 01/22/2000    TUBULAR ADENOMA  . Diverticulosis     Past Surgical History  Procedure Laterality Date  . Colectomy  1992    w/ colostomy  related to colon cancer  . Inguinal hernia repair    . Laparoscopic cholecystectomy  2004  . Coronary artery bypass graft  2007  . Ventral hernia repair  2009     Medications: Current Outpatient Prescriptions  Medication Sig Dispense Refill  . albuterol (PROVENTIL HFA;VENTOLIN HFA) 108 (90 BASE) MCG/ACT inhaler Inhale 2 puffs into the lungs every 4 (four) hours as needed for wheezing or shortness of breath. 1 Inhaler 6  . aspirin 325 MG EC tablet Take 325 mg by mouth daily.      . fish oil-omega-3 fatty acids 1000 MG capsule Take 1,200 g by mouth daily.     . Garlic 149 MG TABS Take 1,000 mg by mouth daily.     Marland Kitchen lisinopril (PRINIVIL,ZESTRIL) 5 MG tablet Take 1 tablet (5 mg total) by mouth daily. 90 tablet 3  . metoprolol succinate (TOPROL-XL) 25 MG 24 hr tablet TAKE ONE TABLET BY MOUTH ONCE DAILY **TAKE EXTRA AS NEEDED FOR PALPITATION AND NEEDS TO CALL AND MAKE APPOINTMENT** 120 tablet 3  . Multiple Vitamin (MULTIVITAMIN) tablet Take 1 tablet by mouth daily.      . nitroGLYCERIN (NITROSTAT) 0.4 MG SL tablet Place 1 tablet (0.4 mg total) under the tongue every 5 (five) minutes as needed for chest pain. 25 tablet prn  . omeprazole (PRILOSEC) 20 MG capsule Take 1 capsule by mouth daily.    . rosuvastatin (CRESTOR) 5 MG tablet Take 1 tablet (5 mg total) by mouth daily. 90 tablet 3  . tamsulosin (FLOMAX) 0.4 MG CAPS capsule Take 1 capsule (0.4 mg total) by mouth daily. 90 capsule 3  . furosemide (LASIX) 40 MG tablet Take 1 tablet (40 mg total) by mouth daily as needed  for fluid or edema. 30 tablet 3   No current facility-administered medications for this visit.    Allergies: No Known Allergies  Social History: The patient  reports that he quit smoking about 19 years ago. His smoking use included Cigarettes. He has a 45 pack-year smoking history. He has never used smokeless tobacco. He reports that he does not drink alcohol or use illicit drugs.   Family History: The patient's family history includes Breast cancer in his sister and sister; Colon cancer in his sister; Emphysema in his father; Heart disease in his father and mother; Lung cancer in his mother.   Review of  Systems: Please see the history of present illness.   Otherwise, the review of systems is positive for chronic DOE, back pain, easy bruising and skipped heart beats.   All other systems are reviewed and negative.   Physical Exam: VS:  BP 130/80 mmHg  Pulse 81  Ht 5\' 11"  (1.803 m)  Wt 242 lb 1.9 oz (109.825 kg)  BMI 33.78 kg/m2  SpO2 95% .  BMI Body mass index is 33.78 kg/(m^2).  Wt Readings from Last 3 Encounters:  09/08/15 242 lb 1.9 oz (109.825 kg)  08/08/15 241 lb (109.317 kg)  05/17/15 246 lb (111.585 kg)    General: Alert. Little flat with his affect. He is in no acute distress.  HEENT: Normal. Neck: Supple, no JVD, carotid bruits, or masses noted.  Cardiac: Regular rate and rhythm. No murmurs, rubs, or gallops. No edema today.  Respiratory:  Lungs are clear to auscultation bilaterally with normal work of breathing.  GI: Soft and nontender.  MS: No deformity or atrophy. Gait and ROM intact. Skin: Warm and dry. Color is normal.  Neuro:  Strength and sensation are intact and no gross focal deficits noted.  Psych: Alert, appropriate and with normal affect.   LABORATORY DATA:  EKG:  EKG is ordered today. This shows NSR with RBBB - inferior Q's - unchanged.  Lab Results  Component Value Date   WBC 5.4 03/07/2012   HGB 15.1 03/07/2012   HCT 45.8 03/07/2012   PLT  170.0 03/07/2012   GLUCOSE 95 10/13/2012   CHOL 121 07/23/2014   TRIG 115 07/23/2014   HDL 39* 07/23/2014   LDLCALC 59 07/23/2014   ALT 26 07/23/2014   AST 26 07/23/2014   NA 139 10/13/2012   K 4.6 10/13/2012   CL 105 10/13/2012   CREATININE 1.07 05/14/2013   BUN 13 10/13/2012   CO2 26 10/13/2012   TSH 2.09 04/24/2011   INR 1.01 01/11/2011    BNP (last 3 results) No results for input(s): BNP in the last 8760 hours.  ProBNP (last 3 results) No results for input(s): PROBNP in the last 8760 hours.   Other Studies Reviewed Today:  CT CHEST IMPRESSION FROM 07/2015: 1. No definite findings to suggest interstitial lung disease. 2. 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 3. Mild air trapping, indicative of mild small airways disease. 4. Atherosclerosis, including left main and 3 vessel coronary artery disease. Status post median sternotomy for CABG, including LIMA to the LAD. 5. In addition, there is mild aneurysmal dilatation of the ascending thoracic aorta (4.6 cm in diameter). Ascending thoracic aortic aneurysm. Recommend semi-annual imaging followup by CTA or MRA and referral to cardiothoracic surgery if not already obtained. This recommendation follows 2010 ACCF/AHA/AATS/ACR/ASA/SCA/SCAI/SIR/STS/SVM Guidelines for the Diagnosis and Management of Patients With Thoracic Aortic Disease. Circulation. 2010; 121: L893-T342. 6. Additional incidental findings, as above.   Electronically Signed  By: Vinnie Langton M.D.  On: 08/13/2015 09:56  Assessment/Plan:  Aortic aneurysm  Recent scan with slight enlargement. Will get him to follow up with Dr. Cyndia Bent. Need good BP control which he says he has at home.   ATRIAL FIBRILLATION  Paroxysmal atrial fibrillation with only 1 documented episode in 1/11. No atrial fibrillation on 3-week event monitor in 2/12. In the past,  He has declined anticoagulation in the past. He is on  ASA. If he has another atrial fibrillation episode, we will again strongly encourage anticoagulation.   CAD  CAD s/p CABG. No ischemia on  Lexiscan Cardiolite in 8/15. No recent chest pain. Continue ASA, Toprol XL, and lisinopril.   DYSPNEA Chronic, seems to be progressive. We have had a long discussion about the need to find some type of exercise and to work on his weight. Will get his labs updated. Update his echo.   HYPERLIPIDEMIA on Crestor.   HYPERTENSION  BP up a little today - better control at home  OSA To restart CPAP with nasal cannula.   Swelling Most likely related to excessive salt. I refilled his Lasix. Encouraged salt restriction and to use support stockings.   Current medicines are reviewed with the patient today.  The patient does not have concerns regarding medicines other than what has been noted above.  The following changes have been made:  See above.  Labs/ tests ordered today include:    Orders Placed This Encounter  Procedures  . Brain natriuretic peptide  . Basic metabolic panel  . CBC  . Hepatic function panel  . Lipid panel  . TSH  . Magnesium  . Ambulatory referral to Cardiothoracic Surgery  . EKG 12-Lead  . ECHOCARDIOGRAM COMPLETE     Disposition:   FU with Dr. Aundra Dubin in 6 months.   Patient is agreeable to this plan and will call if any problems develop in the interim.   Signed: Burtis Junes, RN, ANP-C 09/08/2015 11:41 AM  [facility] [address] [place], [place]  [address] Phone: [phone] Fax: [phone]

## 2015-09-08 NOTE — Progress Notes (Signed)
No allergies to eggs or soy No diet/weight loss meds No past problems with anesthesia No home oxygen  Refused emmi 

## 2015-09-12 ENCOUNTER — Telehealth: Payer: Self-pay | Admitting: Internal Medicine

## 2015-09-12 NOTE — Telephone Encounter (Signed)
I have placed a sample of suprep at the front desk for patient pick up. Patient verbalizes understanding.

## 2015-09-14 ENCOUNTER — Encounter: Payer: Self-pay | Admitting: Surgery

## 2015-09-14 ENCOUNTER — Institutional Professional Consult (permissible substitution) (INDEPENDENT_AMBULATORY_CARE_PROVIDER_SITE_OTHER): Payer: Medicare Other | Admitting: Surgery

## 2015-09-14 VITALS — BP 136/80 | HR 84 | Resp 20 | Ht 71.0 in | Wt 243.0 lb

## 2015-09-14 DIAGNOSIS — I712 Thoracic aortic aneurysm, without rupture, unspecified: Secondary | ICD-10-CM

## 2015-09-15 ENCOUNTER — Encounter: Payer: Self-pay | Admitting: Surgery

## 2015-09-15 NOTE — Progress Notes (Signed)
Cardiothoracic Surgery Consultation   PCP is Shirline Frees, MD Referring Provider is Burtis Junes, NP  Chief Complaint  Patient presents with  . Thoracic Aortic Aneurysm    Surgical eval, Chest CT 08/13/15    HPI:  The patient is a 74 year old gentleman with hypertension, hyperlipidemia, atrial fibrillation, OSA on CPAP and coronary artery disease s/p CABG in 2007. He has had a known ascending aortic aneurysm that has been followed by Dr. Aundra Dubin since 2013 when the aortic root was noted to be 4.4 cm and the ascending aorta 4.2 cm by MRA. An MRA in 04/2013 and 05/2014 were unchanged. He was recently undergoing evaluation by pulmonary medicine for chronic exertional dyspnea and a high resolution CT of the chest showed the ascending aorta to be 4.6 cm. He denies any chest or back pain.  Past Medical History  Diagnosis Date  . CAD (coronary artery disease)     s/p CABG 2007; NSTEMI in setting of AFib with RVR 12/2009; cath 1/11: S-RCA ok with prox 30-40% and 50% stenoses; S-OM and RI  with patent OM limb but an occluded RI limb; S-D2 and L-LAD ok; EF 55%  . HTN (hypertension)   . Diastolic dysfunction     Echo 1/11:  EF 55-60%; mild LVH; focal basal septal hypertrophy; grade 1 diast.dysfxn; mild BAE and RVE  . GERD (gastroesophageal reflux disease)   . Colon cancer 1992  . BPH (benign prostatic hyperplasia)   . PE (pulmonary embolism) 2007    after CABG  . Lung granuloma     Right upper  . OSA on CPAP   . Dyspnea   . Atrial fibrillation     previously on Multaq  . RBBB (right bundle branch block)   . Hyperlipidemia   . Personal history of colonic polyps 01/22/2000    TUBULAR ADENOMA  . Diverticulosis     Past Surgical History  Procedure Laterality Date  . Colectomy  1992    w/ colostomy  related to colon cancer  . Inguinal hernia repair  2009  . Laparoscopic cholecystectomy  2004  . Coronary artery bypass graft  2007  . Ventral hernia repair  2009  . Hand ligament  reconstruction  1963 1964    tendon repair and nerve; left    Family History  Problem Relation Age of Onset  . Colon cancer Sister   . Lung cancer Mother   . Heart disease Mother   . Breast cancer Sister   . Breast cancer Sister   . Emphysema Father   . Heart disease Father     Social History Social History  Substance Use Topics  . Smoking status: Former Smoker -- 1.00 packs/day for 45 years    Types: Cigarettes    Quit date: 12/18/1995  . Smokeless tobacco: Never Used  . Alcohol Use: No     Comment: quit in 1980    Current Outpatient Prescriptions  Medication Sig Dispense Refill  . aspirin 325 MG EC tablet Take 325 mg by mouth daily.      . Coenzyme Q10 (CO Q-10) 200 MG CAPS Take by mouth daily.    . fish oil-omega-3 fatty acids 1000 MG capsule Take 1,200 g by mouth daily.     . furosemide (LASIX) 40 MG tablet Take 1 tablet (40 mg total) by mouth daily as needed for fluid or edema. 30 tablet 3  . Garlic 893 MG TABS Take 1,000 mg by mouth daily.     Marland Kitchen  HYDROcodone-acetaminophen (NORCO/VICODIN) 5-325 MG tablet Take 1 tablet by mouth every 6 (six) hours as needed for moderate pain.    Marland Kitchen lisinopril (PRINIVIL,ZESTRIL) 5 MG tablet Take 1 tablet (5 mg total) by mouth daily. 90 tablet 3  . metoprolol succinate (TOPROL-XL) 25 MG 24 hr tablet TAKE ONE TABLET BY MOUTH ONCE DAILY **TAKE EXTRA AS NEEDED FOR PALPITATION AND NEEDS TO CALL AND MAKE APPOINTMENT** 120 tablet 3  . Multiple Vitamin (MULTIVITAMIN) tablet Take 1 tablet by mouth daily.      . naproxen sodium (ANAPROX) 220 MG tablet Take 220 mg by mouth 2 (two) times daily with a meal.    . nitroGLYCERIN (NITROSTAT) 0.4 MG SL tablet Place 1 tablet (0.4 mg total) under the tongue every 5 (five) minutes as needed for chest pain. 25 tablet prn  . omeprazole (PRILOSEC) 20 MG capsule Take 1 capsule by mouth daily.    . rosuvastatin (CRESTOR) 5 MG tablet Take 1 tablet (5 mg total) by mouth daily. 90 tablet 3  . SUPREP BOWEL PREP SOLN Take  1 kit by mouth once. 354 mL 0  . tamsulosin (FLOMAX) 0.4 MG CAPS capsule Take 1 capsule (0.4 mg total) by mouth daily. 90 capsule 3   No current facility-administered medications for this visit.    No Known Allergies  Review of Systems  Constitutional: Positive for activity change and fatigue.  HENT:       Dentures  Eyes:       Glasses  Respiratory: Positive for shortness of breath.   Cardiovascular: Positive for palpitations. Negative for chest pain and leg swelling.  Gastrointestinal: Negative.   Endocrine: Negative.   Genitourinary: Negative.   Musculoskeletal: Positive for arthralgias and gait problem.  Skin: Negative.   Allergic/Immunologic: Negative.   Neurological: Negative.   Hematological: Bruises/bleeds easily.  Psychiatric/Behavioral: Negative.     BP 136/80 mmHg  Pulse 84  Resp 20  Ht $R'5\' 11"'Kq$  (1.803 m)  Wt 243 lb (110.224 kg)  BMI 33.91 kg/m2  SpO2 96% Physical Exam  Constitutional: He is oriented to person, place, and time. He appears well-developed and well-nourished. No distress.  HENT:  Head: Normocephalic and atraumatic.  Mouth/Throat: Oropharynx is clear and moist.  Eyes: EOM are normal. Pupils are equal, round, and reactive to light.  Neck: Normal range of motion. Neck supple. No JVD present. No thyromegaly present.  Cardiovascular: Normal rate, regular rhythm, normal heart sounds and intact distal pulses.   No murmur heard. Pulmonary/Chest: Effort normal and breath sounds normal. No respiratory distress. He has no wheezes. He has no rales.  Abdominal: Soft. Bowel sounds are normal. He exhibits distension. He exhibits no mass. There is no tenderness.  Musculoskeletal: Normal range of motion. He exhibits no edema.  Lymphadenopathy:    He has no cervical adenopathy.  Neurological: He is alert and oriented to person, place, and time. He has normal strength. No cranial nerve deficit or sensory deficit.  Skin: Skin is warm and dry.  Psychiatric: He has a  normal mood and affect.     Diagnostic Tests:  CLINICAL DATA: 74 year old male with increasing shortness of breath over the past 4-5 years. Restrictive lung disease. Evaluate for possible interstitial lung disease. Prior smoker, quit in the 1990s.  EXAM: CT CHEST WITHOUT CONTRAST  TECHNIQUE: Multidetector CT imaging of the chest was performed following the standard protocol without intravenous contrast. High resolution imaging of the lungs, as well as inspiratory and expiratory imaging, was performed.  COMPARISON: Chest CT 05/23/2009.  FINDINGS: Mediastinum/Lymph Nodes: Heart size is normal. There is no significant pericardial fluid, thickening or pericardial calcification. There is atherosclerosis of the thoracic aorta, the great vessels of the mediastinum and the coronary arteries, including calcified atherosclerotic plaque in the left main, left anterior descending, left circumflex and right coronary arteries. Status post median sternotomy for CABG, including LIMA to the LAD. Mild aneurysmal dilatation of the ascending thoracic aorta which measures up to 4.6 cm in diameter. Multiple borderline enlarged and mildly enlarged mediastinal lymph nodes are noted, measuring up to 13 mm in short axis in the low right paratracheal nodal station. No definite hilar lymphadenopathy noted on today's noncontrast CT examination. Esophagus is unremarkable in appearance. No axillary lymphadenopathy.  Lungs/Pleura: High-resolution images demonstrate minimal subpleural reticulation in the lower lobes of the lungs bilaterally, which is extremely mild and similar in retrospect to remote prior CT scan from 05/23/2009. High-resolution images demonstrate no other areas of significant ground-glass attenuation, subpleural reticulation, parenchymal banding or frank honeycombing to suggest interstitial lung disease. Mild diffuse bronchial wall thickening and very mild cylindrical  bronchiectasis, most evident in the lower lobes of the lungs. Inspiratory and expiratory imaging demonstrates some very mild air trapping, indicative of mild small airways disease. No acute consolidative airspace disease. No pleural effusions. Small subpleural lymph nodes along the minor fissure are unchanged (benign). Small calcified granuloma in the right upper lobe. 3 mm right upper lobe pulmonary nodule (image 15 of series 5), also unchanged, considered benign. No other new suspicious appearing pulmonary nodules or masses are noted.  Upper Abdomen: Status post cholecystectomy.  Musculoskeletal/Soft Tissues: Median sternotomy wires. There are no aggressive appearing lytic or blastic lesions noted in the visualized portions of the skeleton.  IMPRESSION: 1. No definite findings to suggest interstitial lung disease. 2. 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 3. Mild air trapping, indicative of mild small airways disease. 4. Atherosclerosis, including left main and 3 vessel coronary artery disease. Status post median sternotomy for CABG, including LIMA to the LAD. 5. In addition, there is mild aneurysmal dilatation of the ascending thoracic aorta (4.6 cm in diameter). Ascending thoracic aortic aneurysm. Recommend semi-annual imaging followup by CTA or MRA and referral to cardiothoracic surgery if not already obtained. This recommendation follows 2010 ACCF/AHA/AATS/ACR/ASA/SCA/SCAI/SIR/STS/SVM Guidelines for the Diagnosis and Management of Patients With Thoracic Aortic Disease. Circulation. 2010; 121: C623-J628. 6. Additional incidental findings, as above.   Electronically Signed  By: Vinnie Langton M.D.  On: 08/13/2015 09:56      Result Notes     Notes Recorded by Collene Gobble, MD on 08/16/2015 at 11:53 AM Please inform patient no evidence ILD on Ct scan          Vitals     Height Weight BMI  (Calculated)    '5\' 11"'$  (1.803 m) 241 lb (109.317 kg) 33.7      Interpretation Summary     CLINICAL DATA: 74 year old male with increasing shortness of breath over the past 4-5 years. Restrictive lung disease. Evaluate for possible interstitial lung disease. Prior smoker, quit in the 1990s.  EXAM: CT CHEST WITHOUT CONTRAST  TECHNIQUE: Multidetector CT imaging of the chest was performed following the standard protocol without intravenous contrast. High resolution imaging of the lungs, as well as inspiratory and expiratory imaging, was performed.  COMPARISON: Chest CT 05/23/2009.  FINDINGS: Mediastinum/Lymph Nodes: Heart size is normal. There is no significant pericardial fluid, thickening or pericardial calcification. There is atherosclerosis of the thoracic aorta, the  great vessels of the mediastinum and the coronary arteries, including calcified atherosclerotic plaque in the left main, left anterior descending, left circumflex and right coronary arteries. Status post median sternotomy for CABG, including LIMA to the LAD. Mild aneurysmal dilatation of the ascending thoracic aorta which measures up to 4.6 cm in diameter. Multiple borderline enlarged and mildly enlarged mediastinal lymph nodes are noted, measuring up to 13 mm in short axis in the low right paratracheal nodal station. No definite hilar lymphadenopathy noted on today's noncontrast CT examination. Esophagus is unremarkable in appearance. No axillary lymphadenopathy.  Lungs/Pleura: High-resolution images demonstrate minimal subpleural reticulation in the lower lobes of the lungs bilaterally, which is extremely mild and similar in retrospect to remote prior CT scan from 05/23/2009. High-resolution images demonstrate no other areas of significant ground-glass attenuation, subpleural reticulation, parenchymal banding or frank honeycombing to suggest interstitial lung disease. Mild diffuse bronchial wall  thickening and very mild cylindrical bronchiectasis, most evident in the lower lobes of the lungs. Inspiratory and expiratory imaging demonstrates some very mild air trapping, indicative of mild small airways disease. No acute consolidative airspace disease. No pleural effusions. Small subpleural lymph nodes along the minor fissure are unchanged (benign). Small calcified granuloma in the right upper lobe. 3 mm right upper lobe pulmonary nodule (image 15 of series 5), also unchanged, considered benign. No other new suspicious appearing pulmonary nodules or masses are noted.  Upper Abdomen: Status post cholecystectomy.  Musculoskeletal/Soft Tissues: Median sternotomy wires. There are no aggressive appearing lytic or blastic lesions noted in the visualized portions of the skeleton.  IMPRESSION: 1. No definite findings to suggest interstitial lung disease. 2. 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 3. Mild air trapping, indicative of mild small airways disease. 4. Atherosclerosis, including left main and 3 vessel coronary artery disease. Status post median sternotomy for CABG, including LIMA to the LAD. 5. In addition, there is mild aneurysmal dilatation of the ascending thoracic aorta (4.6 cm in diameter). Ascending thoracic aortic aneurysm. Recommend semi-annual imaging followup by CTA or MRA and referral to cardiothoracic surgery if not already obtained. This recommendation follows 2010 ACCF/AHA/AATS/ACR/ASA/SCA/SCAI/SIR/STS/SVM Guidelines for the Diagnosis and Management of Patients With Thoracic Aortic Disease. Circulation. 2010; 121: Z329-J242. 6. Additional incidental findings, as above.   Electronically Signed  By: Vinnie Langton M.D.  On: 08/13/2015 09:56     Impression:  He has a 4.6 cm fusiform ascending aortic aneurysm that may be slightly larger than it was in 05/2014. It is still well below the 5.5 cm  threshold where we recommend surgical repair and I would be very conservative in this 74 year old with previous CABG. I discussed the importance of continued good blood pressure control for preventing further enlargement and aortic dissection. He is on a beta blocker.   Plan:  I think he should have another MRA in one year and I will be happy to do this and follow up on his aneurysm unless Dr. Aundra Dubin wants to continue to follow it. We will put him in our file to contact in about a year to schedule a follow up scan unless Dr. Aundra Dubin already has it scheduled. Gaye Pollack, MD [facility] [phone]

## 2015-09-16 ENCOUNTER — Ambulatory Visit (HOSPITAL_COMMUNITY): Payer: Medicare Other | Attending: Cardiovascular Disease

## 2015-09-16 ENCOUNTER — Other Ambulatory Visit: Payer: Self-pay

## 2015-09-16 DIAGNOSIS — Z951 Presence of aortocoronary bypass graft: Secondary | ICD-10-CM | POA: Insufficient documentation

## 2015-09-16 DIAGNOSIS — R252 Cramp and spasm: Secondary | ICD-10-CM

## 2015-09-16 DIAGNOSIS — R0600 Dyspnea, unspecified: Secondary | ICD-10-CM | POA: Diagnosis present

## 2015-09-16 DIAGNOSIS — E785 Hyperlipidemia, unspecified: Secondary | ICD-10-CM | POA: Insufficient documentation

## 2015-09-16 DIAGNOSIS — I5032 Chronic diastolic (congestive) heart failure: Secondary | ICD-10-CM | POA: Diagnosis not present

## 2015-09-16 DIAGNOSIS — I10 Essential (primary) hypertension: Secondary | ICD-10-CM | POA: Diagnosis not present

## 2015-09-16 DIAGNOSIS — I517 Cardiomegaly: Secondary | ICD-10-CM | POA: Insufficient documentation

## 2015-09-16 DIAGNOSIS — I480 Paroxysmal atrial fibrillation: Secondary | ICD-10-CM

## 2015-09-16 DIAGNOSIS — I2510 Atherosclerotic heart disease of native coronary artery without angina pectoris: Secondary | ICD-10-CM | POA: Diagnosis not present

## 2015-09-16 DIAGNOSIS — G4733 Obstructive sleep apnea (adult) (pediatric): Secondary | ICD-10-CM | POA: Diagnosis not present

## 2015-09-19 ENCOUNTER — Encounter: Payer: Self-pay | Admitting: Internal Medicine

## 2015-09-19 ENCOUNTER — Ambulatory Visit (AMBULATORY_SURGERY_CENTER): Payer: Medicare Other | Admitting: Internal Medicine

## 2015-09-19 VITALS — BP 134/90 | HR 73 | Temp 97.2°F | Resp 20 | Ht 71.0 in | Wt 243.0 lb

## 2015-09-19 DIAGNOSIS — D123 Benign neoplasm of transverse colon: Secondary | ICD-10-CM | POA: Diagnosis not present

## 2015-09-19 DIAGNOSIS — Z85038 Personal history of other malignant neoplasm of large intestine: Secondary | ICD-10-CM

## 2015-09-19 DIAGNOSIS — D122 Benign neoplasm of ascending colon: Secondary | ICD-10-CM | POA: Diagnosis not present

## 2015-09-19 MED ORDER — SODIUM CHLORIDE 0.9 % IV SOLN: 500.0000 mL | INTRAVENOUS | Status: DC

## 2015-09-19 NOTE — Progress Notes (Signed)
Report to PACU, RN, vss, BBS= Clear.  

## 2015-09-19 NOTE — Progress Notes (Signed)
Called to room to assist during endoscopic procedure.  Patient ID and intended procedure confirmed with present staff. Received instructions for my participation in the procedure from the performing physician.  

## 2015-09-19 NOTE — Op Note (Signed)
[place]  [address] [place], [id number]   COLONOSCOPY PROCEDURE REPORT  PATIENT: Samuel, Bennett  MR#: [id number] BIRTHDATE: 02/22/1941 , 74  yrs. old GENDER: male ENDOSCOPIST: Jerene Bears, MD PROCEDURE DATE:  09/19/2015 PROCEDURE:   Colonoscopy, surveillance and Colonoscopy with snare polypectomy First Screening Colonoscopy - Avg.  risk and is 50 yrs.  old or older - No.  Prior Negative Screening - Now for repeat screening. N/A  History of Adenoma - Now for follow-up colonoscopy & has been > or = to 3 yrs.  Yes hx of adenoma.  Has been 3 or more years since last colonoscopy.  Polyps removed today? Yes ASA CLASS:   Class III INDICATIONS:Surveillance due to prior colonic neoplasia and PH sigmoid cancer s/p resection in 1992, last colonoscopy May 2013 MEDICATIONS: Monitored anesthesia care and Propofol 160 mg IV  DESCRIPTION OF PROCEDURE:   After the risks benefits and alternatives of the procedure were thoroughly explained, informed consent was obtained.  The digital rectal exam revealed no rectal mass.   The [id number]  endoscope was introduced through the anus and advanced to the cecum, which was identified by both the appendix and ileocecal valve. No adverse events experienced. The quality of the prep was good.  (Suprep was used)  The instrument was then slowly withdrawn as the colon was fully examined. Estimated blood loss is zero unless otherwise noted in this procedure report.  COLON FINDINGS: Four sessile polyps ranging from 4 to 72mm in size were found in the ascending colon (2) and transverse colon (2). Polypectomies were performed with a cold snare.  The resection was complete, the polyp tissue was completely retrieved and sent to histology.   There was severe diverticulosis noted in the left colon.   There was evidence of a normal appearing prior surgical anastomosis in the sigmoid colon.  Retroflexed views revealed internal  hemorrhoids. The time to cecum = 2.6 Withdrawal time = 13.0   The scope was withdrawn and the procedure completed. COMPLICATIONS: There were no immediate complications.  ENDOSCOPIC IMPRESSION: 1.   Four sessile polyps ranging from 4 to 35mm in size were found in the ascending colon and transverse colon; polypectomies were performed with a cold snare 2.   Severe diverticulosis was noted in the left colon 3.   There was evidence of prior surgical anastomosis in the sigmoid colon  RECOMMENDATIONS: 1.  Await pathology results 2.  High fiber diet 3.  Timing of repeat colonoscopy will be determined by pathology findings. 4.  You will receive a letter within 1-2 weeks with the results of your biopsy as well as final recommendations.  Please call my office if you have not received a letter after 3 weeks.  eSigned:  Jerene Bears, MD 09/19/2015 9:44 AM   cc: Shirline Frees MD and The Patient   PATIENT NAME:  Samuel, Bennett MR#: [id number]

## 2015-09-19 NOTE — Progress Notes (Signed)
No problems noted in the recovery room. maw 

## 2015-09-19 NOTE — Patient Instructions (Signed)
YOU HAD AN ENDOSCOPIC PROCEDURE TODAY AT THE [facility]:   Refer to the procedure report that was given to you for any specific questions about what was found during the examination.  If the procedure report does not answer your questions, please call your gastroenterologist to clarify.  If you requested that your care partner not be given the details of your procedure findings, then the procedure report has been included in a sealed envelope for you to review at your convenience later.  YOU SHOULD EXPECT: Some feelings of bloating in the abdomen. Passage of more gas than usual.  Walking can help get rid of the air that was put into your GI tract during the procedure and reduce the bloating. If you had a lower endoscopy (such as a colonoscopy or flexible sigmoidoscopy) you may notice spotting of blood in your stool or on the toilet paper. If you underwent a bowel prep for your procedure, you may not have a normal bowel movement for a few days.  Please Note:  You might notice some irritation and congestion in your nose or some drainage.  This is from the oxygen used during your procedure.  There is no need for concern and it should clear up in a day or so.  SYMPTOMS TO REPORT IMMEDIATELY:   Following lower endoscopy (colonoscopy or flexible sigmoidoscopy):  Excessive amounts of blood in the stool  Significant tenderness or worsening of abdominal pains  Swelling of the abdomen that is new, acute  Fever of 100F or higher  For urgent or emergent issues, a gastroenterologist can be reached at any hour by calling [phone].   DIET: Your first meal following the procedure should be a small meal and then it is ok to progress to your normal diet. Heavy or fried foods are harder to digest and may make you feel nauseous or bloated.  Likewise, meals heavy in dairy and vegetables can increase bloating.  Drink plenty of fluids but you should avoid alcoholic beverages for 24  hours.  ACTIVITY:  You should plan to take it easy for the rest of today and you should NOT DRIVE or use heavy machinery until tomorrow (because of the sedation medicines used during the test).    FOLLOW UP: Our staff will call the number listed on your records the next business day following your procedure to check on you and address any questions or concerns that you may have regarding the information given to you following your procedure. If we do not reach you, we will leave a message.  However, if you are feeling well and you are not experiencing any problems, there is no need to return our call.  We will assume that you have returned to your regular daily activities without incident.  If any biopsies were taken you will be contacted by phone or by letter within the next 1-3 weeks.  Please call us at [phone] if you have not heard about the biopsies in 3 weeks.    SIGNATURES/CONFIDENTIALITY: You and/or your care partner have signed paperwork which will be entered into your electronic medical record.  These signatures attest to the fact that that the information above on your After Visit Summary has been reviewed and is understood.  Full responsibility of the confidentiality of this discharge information lies with you and/or your care-partner.    Handouts were given to your care partner on polyps, diverticulosis, and a high fiber diet with liberal fluid intake. You may resume your   current medications today. Await biopsy results. Please call if any questions or concerns.   

## 2015-09-20 ENCOUNTER — Telehealth: Payer: Self-pay | Admitting: *Deleted

## 2015-09-20 NOTE — Telephone Encounter (Signed)
  Follow up Call-  Call back number 09/19/2015  Post procedure Call Back phone  # [phone]  Permission to leave phone message Yes     Patient questions:  Do you have a fever, pain , or abdominal swelling? No. Pain Score  0 *  Have you tolerated food without any problems? Yes.    Have you been able to return to your normal activities? Yes.    Do you have any questions about your discharge instructions: Diet   No. Medications  No. Follow up visit  No.  Do you have questions or concerns about your Care? No.  Actions: * If pain score is 4 or above: No action needed, pain <4.

## 2015-09-21 ENCOUNTER — Encounter: Payer: Self-pay | Admitting: Gastroenterology

## 2015-09-22 ENCOUNTER — Encounter: Payer: Self-pay | Admitting: Internal Medicine

## 2016-01-03 ENCOUNTER — Ambulatory Visit: Payer: Medicare Other | Admitting: Emergency Medicine

## 2016-01-19 ENCOUNTER — Encounter: Payer: Self-pay | Admitting: Cardiovascular Disease

## 2016-05-15 ENCOUNTER — Encounter: Payer: Self-pay | Admitting: Emergency Medicine

## 2016-08-14 ENCOUNTER — Encounter: Payer: Self-pay | Admitting: Emergency Medicine

## 2016-08-14 ENCOUNTER — Ambulatory Visit (INDEPENDENT_AMBULATORY_CARE_PROVIDER_SITE_OTHER): Payer: Medicare Other | Admitting: Emergency Medicine

## 2016-08-14 VITALS — BP 124/82 | HR 77 | Wt 239.0 lb

## 2016-08-14 DIAGNOSIS — G4733 Obstructive sleep apnea (adult) (pediatric): Secondary | ICD-10-CM | POA: Diagnosis not present

## 2016-08-14 NOTE — Patient Instructions (Signed)
We will work on re-ordering auto-titration CPAP with nasal pillows and a chin strap through [employer].  Follow with Dr Lamonte Sakai in 3 months or sooner if you have any problems.

## 2016-08-14 NOTE — Assessment & Plan Note (Signed)
Not currently on therapy but he is interested in starting. His original sleep study was in 2009. I'd like to start him on an auto titration device with nasal pillows and a chinstrap. Not clear to me whether he'll be able to do this based on the old sleep study. He may need a new polysomnogram. If so then we will arrange for this. I'll see him back in about 3 months to see how he is tolerating therapy, to check his compliance.

## 2016-08-14 NOTE — Progress Notes (Signed)
Subjective:    Patient ID: [name], male    DOB: 1941-02-24, 75 y.o.   MRN: [id number]  Shortness of Breath  Pertinent negatives include no ear pain, fever, headaches, leg swelling, rash, rhinorrhea, sore throat, vomiting or wheezing.   75 year old former smoker (45-pack-years) with a history of coronary artery disease/CABG, atrial fibrillation, obstructive sleep apnea unable to tolerate CPAP. He is referred today for dyspnea. Has been longstanding, worse over 5 years. I reviewed his cardiology notes, prior PFT from 02/07/10 that show mild AFL, hyperinflation. He underwent a cardiac stress test 07/29/14 > reassuring.  He experiences exertional SOB, minimal cough, no wheeze. He has put on some wt about 10 lbs over 6 month. He still gardens, walks.   ROV 08/08/15 -- follows up for dyspnea and hx tobacco, prior PFT with mild obstruction. He underwent repeat PFT on 07/21/15 that I have personally reviewed. These show mild mixed disease, primarily restriction, without a BD response. His lung volumes are normal. DLCO is decreased but corrects for Va.  History is taken from him and his wife. He has had stable exertional dyspnea since last visit. No CP, cough, sputum.   ROV 08/14/16 -- This is a follow-up visit For patient with a history of tobacco use and mild obstruction on pulmonary function testing, also with obstructive sleep apnea not able to tolerate CPAP. He is followed by cardiology for coronary disease, CABG, paroxysmal fibrillation with diastolic CHF. He also has a history of an aortic root aneurysm. 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 We had discussed restarting CPAP - he is willing and ready to do so now. He is fatigued all the time, his wife has witnessed apneas. Originally dx at [facility] in 2009.     Review of Systems  Constitutional: Positive for fatigue. Negative for fever and  unexpected weight change.  HENT: Positive for congestion and postnasal drip. Negative for dental problem, ear pain, nosebleeds, rhinorrhea, sinus pressure, sneezing, sore throat and trouble swallowing.   Eyes: Negative for redness and itching.  Respiratory: Positive for cough. Negative for chest tightness, shortness of breath and wheezing.   Cardiovascular: Negative for palpitations and leg swelling.  Gastrointestinal: Negative for nausea and vomiting.  Genitourinary: Negative for dysuria.  Musculoskeletal: Negative for joint swelling and myalgias.  Skin: Negative for rash.  Neurological: Negative for headaches.  Hematological: Does not bruise/bleed easily.  Psychiatric/Behavioral: Negative for dysphoric mood. The patient is not nervous/anxious.    Past Medical History:  Diagnosis Date  . Atrial fibrillation ([place])    previously on Multaq  . BPH (benign prostatic hyperplasia)   . CAD (coronary artery disease)    s/p CABG 2007; NSTEMI in setting of AFib with RVR 12/2009; cath 1/11: S-RCA ok with prox 30-40% and 50% stenoses; S-OM and RI  with patent OM limb but an occluded RI limb; S-D2 and L-LAD ok; EF 55%  . Colon cancer ([place]) 1992  . Diastolic dysfunction    Echo 1/11:  EF 55-60%; mild LVH; focal basal septal hypertrophy; grade 1 diast.dysfxn; mild BAE and RVE  . Diverticulosis   . Dyspnea   . GERD (gastroesophageal reflux disease)   . HTN (hypertension)   . Hyperlipidemia   . 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 OSA on CPAP   . PE (pulmonary embolism) 2007   after CABG  . Personal history of colonic  polyps 01/22/2000   TUBULAR ADENOMA  . RBBB (right bundle branch block)      Family History  Problem Relation Age of Onset  . Colon cancer Sister   . Lung cancer Mother   . Heart disease Mother   . Breast cancer Sister   . Breast cancer Sister   . Emphysema Father   . Heart disease Father      Social History   Social History  . Marital status: Married    Spouse name:  N/A  . Number of children: 4  . Years of education: N/A   Occupational History  . Retired     [employer] History Main Topics  . Smoking status: Former Smoker    Packs/day: 1.00    Years: 45.00    Types: Cigarettes    Quit date: 12/18/1995  . Smokeless tobacco: Never Used  . Alcohol use No     Comment: quit in 1980  . Drug use: No  . Sexual activity: Not on file   Other Topics Concern  . Not on file   Social History Narrative   Daily caffeine   he has been exposed to animals - hogs, mules.    No Known Allergies   Outpatient Medications Prior to Visit  Medication Sig Dispense Refill  . aspirin 325 MG EC tablet Take 325 mg by mouth daily.      . fish oil-omega-3 fatty acids 1000 MG capsule Take 1,200 g by mouth daily.     . furosemide (LASIX) 40 MG tablet Take 1 tablet (40 mg total) by mouth daily as needed for fluid or edema. 30 tablet 3  . Garlic 123XX123 MG TABS Take 1,000 mg by mouth daily.     Marland Kitchen HYDROcodone-acetaminophen (NORCO/VICODIN) 5-325 MG tablet Take 1 tablet by mouth every 6 (six) hours as needed for moderate pain.    Marland Kitchen lisinopril (PRINIVIL,ZESTRIL) 5 MG tablet Take 1 tablet (5 mg total) by mouth daily. 90 tablet 3  . metoprolol succinate (TOPROL-XL) 25 MG 24 hr tablet TAKE ONE TABLET BY MOUTH ONCE DAILY **TAKE EXTRA AS NEEDED FOR PALPITATION AND NEEDS TO CALL AND MAKE APPOINTMENT** 120 tablet 3  . Multiple Vitamin (MULTIVITAMIN) tablet Take 1 tablet by mouth daily.      . naproxen sodium (ANAPROX) 220 MG tablet Take 220 mg by mouth 2 (two) times daily with a meal.    . nitroGLYCERIN (NITROSTAT) 0.4 MG SL tablet Place 1 tablet (0.4 mg total) under the tongue every 5 (five) minutes as needed for chest pain. 25 tablet prn  . omeprazole (PRILOSEC) 20 MG capsule Take 1 capsule by mouth daily.    . rosuvastatin (CRESTOR) 5 MG tablet Take 1 tablet (5 mg total) by mouth daily. 90 tablet 3  . tamsulosin (FLOMAX) 0.4 MG CAPS capsule Take 1 capsule (0.4 mg  total) by mouth daily. 90 capsule 3  . Coenzyme Q10 (CO Q-10) 200 MG CAPS Take by mouth daily.     No facility-administered medications prior to visit.          Objective:   Physical Exam Vitals:   08/14/16 1411 08/14/16 1412  BP:  124/82  Pulse:  77  SpO2:  97%  Weight: 239 lb (108.4 kg)    Gen: Pleasant, well-nourished, in no distress,  normal affect  ENT: No lesions,  mouth clear,  oropharynx clear, no postnasal drip  Neck: No JVD, no TMG, no carotid bruits  Lungs: No use  of accessory muscles, no dullness to percussion, clear without rales or rhonchi  Cardiovascular: RRR, heart sounds normal, no murmur or gallops, no peripheral edema  Musculoskeletal: No deformities, no cyanosis or clubbing  Neuro: alert, non focal  Skin: Warm, no lesions or rashes     Assessment & Plan:  Obstructive sleep apnea Not currently on therapy but he is interested in starting. His original sleep study was in 2009. I'd like to start him on an auto titration device with nasal pillows and a chinstrap. Not clear to me whether he'll be able to do this based on the old sleep study. He may need a new polysomnogram. If so then we will arrange for this. I'll see him back in about 3 months to see how he is tolerating therapy, to check his compliance.  Baltazar Apo, MD, PhD 08/14/2016, 2:43 PM [facility] [phone] or if no answer [phone]

## 2016-08-16 ENCOUNTER — Other Ambulatory Visit: Payer: Self-pay | Admitting: Surgery

## 2016-08-16 DIAGNOSIS — I712 Thoracic aortic aneurysm, without rupture: Secondary | ICD-10-CM

## 2016-08-16 DIAGNOSIS — I7121 Aneurysm of the ascending aorta, without rupture: Secondary | ICD-10-CM

## 2016-08-21 ENCOUNTER — Telehealth: Payer: Self-pay | Admitting: Emergency Medicine

## 2016-08-21 DIAGNOSIS — G4733 Obstructive sleep apnea (adult) (pediatric): Secondary | ICD-10-CM

## 2016-08-21 NOTE — Telephone Encounter (Signed)
Spoke with Beaver at [facility].  Needs new order for CPAP with Pressure settings.    Dr. Lamonte Sakai, please advise what pressure setting you would like to order for this patient?

## 2016-08-21 NOTE — Telephone Encounter (Signed)
Please order auto titration CPAP, range 5-20 cm H2O, with heated humidity. He needs nasal pillows with a chinstrap.

## 2016-08-21 NOTE — Telephone Encounter (Signed)
Order entered. Nothing further needed.  

## 2016-09-04 ENCOUNTER — Telehealth: Payer: Self-pay | Admitting: Emergency Medicine

## 2016-09-04 DIAGNOSIS — G4733 Obstructive sleep apnea (adult) (pediatric): Secondary | ICD-10-CM

## 2016-09-04 NOTE — Telephone Encounter (Signed)
Spoke with Amy @ [facility]. She states that order needs to say "Please provide loaner CPAP for 2 week study". Order placed with requested documentation.   Spoke with pt's wife and advised. Nothing further needed.

## 2016-09-12 ENCOUNTER — Ambulatory Visit
Admission: RE | Admit: 2016-09-12 | Discharge: 2016-09-12 | Disposition: A | Discharge status: Home / Self Care | Payer: Medicare Other | Source: Ambulatory Visit | Attending: Surgery | Admitting: Surgery

## 2016-09-12 ENCOUNTER — Ambulatory Visit (INDEPENDENT_AMBULATORY_CARE_PROVIDER_SITE_OTHER): Payer: Medicare Other | Admitting: Surgery

## 2016-09-12 VITALS — BP 137/88 | HR 96 | Resp 20 | Ht 71.0 in | Wt 239.0 lb

## 2016-09-12 DIAGNOSIS — I712 Thoracic aortic aneurysm, without rupture, unspecified: Secondary | ICD-10-CM

## 2016-09-12 DIAGNOSIS — I7121 Aneurysm of the ascending aorta, without rupture: Secondary | ICD-10-CM

## 2016-09-12 MED ORDER — GADOBENATE DIMEGLUMINE 529 MG/ML IV SOLN
20.0000 mL | Freq: Once | INTRAVENOUS | Status: AC | PRN
  Administered 2016-09-12: 20 mL via INTRAVENOUS

## 2016-09-17 ENCOUNTER — Encounter: Payer: Self-pay | Admitting: Surgery

## 2016-09-17 NOTE — Progress Notes (Signed)
HPI:  The patient is a 75 year old gentleman with hypertension, hyperlipidemia, atrial fibrillation, OSA on CPAP and coronary artery disease s/p CABG in 2007. He has had a known ascending aortic aneurysm that has been followed by Dr. Aundra Dubin since 2013 when the aortic root was noted to be 4.4 cm and the ascending aorta 4.2 cm by MRA. An MRA in 04/2013 and 05/2014 were unchanged. He was undergoing evaluation by pulmonary medicine for chronic exertional dyspnea last year and a high resolution CT of the chest showed the ascending aorta to be 4.6 cm. He has been feeling well without chest or back pain.  Current Outpatient Prescriptions  Medication Sig Dispense Refill  . aspirin 325 MG EC tablet Take 325 mg by mouth daily.      . fish oil-omega-3 fatty acids 1000 MG capsule Take 1,200 g by mouth daily.     . furosemide (LASIX) 40 MG tablet Take 1 tablet (40 mg total) by mouth daily as needed for fluid or edema. 30 tablet 3  . Garlic 123XX123 MG TABS Take 1,000 mg by mouth daily.     Marland Kitchen HYDROcodone-acetaminophen (NORCO/VICODIN) 5-325 MG tablet Take 1 tablet by mouth every 6 (six) hours as needed for moderate pain.    Marland Kitchen lisinopril (PRINIVIL,ZESTRIL) 5 MG tablet Take 1 tablet (5 mg total) by mouth daily. 90 tablet 3  . metoprolol succinate (TOPROL-XL) 25 MG 24 hr tablet TAKE ONE TABLET BY MOUTH ONCE DAILY **TAKE EXTRA AS NEEDED FOR PALPITATION AND NEEDS TO CALL AND MAKE APPOINTMENT** 120 tablet 3  . Multiple Vitamin (MULTIVITAMIN) tablet Take 1 tablet by mouth daily.      . naproxen sodium (ANAPROX) 220 MG tablet Take 220 mg by mouth 2 (two) times daily with a meal.    . nitroGLYCERIN (NITROSTAT) 0.4 MG SL tablet Place 1 tablet (0.4 mg total) under the tongue every 5 (five) minutes as needed for chest pain. 25 tablet prn  . omeprazole (PRILOSEC) 20 MG capsule Take 1 capsule by mouth daily.    . rosuvastatin (CRESTOR) 5 MG tablet Take 1 tablet (5 mg total) by mouth daily. 90 tablet 3  . tamsulosin (FLOMAX)  0.4 MG CAPS capsule Take 1 capsule (0.4 mg total) by mouth daily. 90 capsule 3   No current facility-administered medications for this visit.      Physical Exam: BP 137/88 (BP Location: Right Arm, Patient Position: Sitting, Cuff Size: Large)   Pulse 96   Resp 20   Ht 5\' 11"  (1.803 m)   Wt 239 lb (108.4 kg)   SpO2 96% Comment: RA  BMI 33.33 kg/m  He looks well Cardiac exam shows a regular rate and rhythm with normal heart sounds Lungs are clear  Diagnostic Tests:  CLINICAL DATA:  75 year old male with ascending aortic aneurysm  EXAM: MRA CHEST WITH OR WITHOUT CONTRAST  TECHNIQUE: Angiographic images of the chest were obtained using MRA technique without and with intravenous contrast.  CONTRAST:  63mL MULTIHANCE GADOBENATE DIMEGLUMINE 529 MG/ML IV SOLN  COMPARISON:  Prior CT scan of the chest 08/12/2015; prior MRA chest 06/03/2014  FINDINGS: VASCULAR  Aorta: Fusiform aneurysmal dilatation of the ascending thoracic aorta is similar to perhaps incrementally increased in size at 4.4 cm. The aorta then tapers to normal caliber at the arch. The descending thoracic aorta is normal in caliber. No evidence of dissection. No dilatation of the aortic root or effacement of the sino-tubular junction. The descending thoracic aorta is tortuous and elongated.  Heart: The heart is normal in size.  No pericardial effusion.  Pulmonary Arteries: Patent and unremarkable main and central pulmonary arteries.  Other: Patient is status post median sternotomy.  NON-VASCULAR  No focal signal abnormality or abnormal enhancement in the mediastinum, lungs or musculoskeletal soft tissues.  IMPRESSION: VASCULAR  Stable to perhaps incrementally enlarged fusiform aneurysmal dilatation of the ascending thoracic aorta with a maximal diameter of 4.4 cm today compared to. 4.3 cm previously.  NON-VASCULAR  No significant abnormalities.  Signed,  Criselda Peaches,  MD  [facility]  [facility]   Electronically Signed   By: Jacqulynn Cadet M.D.   On: 09/12/2016 14:15   Impression:  I have personally reviewed and interpreted his MRA of the chest from today as well as his High Resolution chest CT from 07/2015 and his prior MRA of the chest from 05/2014. I think the ascending aorta has been about the same size despite varying measurements recorded by radiology. I have measured it at 4.4 cm on the current scan. I reviewed the scans with him and his wife and answered their questions. His BP is under adequate control and he is on a beta blocker. This is still well below the surgical threshold of 5.5 cm.  Plan:  I will plan to see him back in one year with a CTA of the chest.   Gaye Pollack, MD [facility] [phone]

## 2016-09-19 ENCOUNTER — Other Ambulatory Visit: Payer: Self-pay | Admitting: Nurse Practitioner

## 2016-10-05 ENCOUNTER — Encounter: Payer: Self-pay | Admitting: Emergency Medicine

## 2016-10-17 ENCOUNTER — Encounter: Payer: Self-pay | Admitting: Emergency Medicine

## 2016-10-23 ENCOUNTER — Other Ambulatory Visit: Payer: Self-pay | Admitting: Nurse Practitioner

## 2016-11-14 ENCOUNTER — Telehealth: Payer: Self-pay | Admitting: Physician Assistant

## 2016-11-14 MED ORDER — LISINOPRIL 5 MG PO TABS: 5.0000 mg | ORAL_TABLET | Freq: Every day | ORAL | 1 refills | Status: DC

## 2016-11-14 MED ORDER — ROSUVASTATIN CALCIUM 5 MG PO TABS: 5.0000 mg | ORAL_TABLET | Freq: Every day | ORAL | 1 refills | Status: DC

## 2016-11-14 MED ORDER — METOPROLOL SUCCINATE ER 25 MG PO TB24: ORAL_TABLET | ORAL | 1 refills | Status: DC

## 2016-11-14 NOTE — Telephone Encounter (Signed)
Pt is needing his medication filled--his wife called and was not sure which meds they were--please give him a call

## 2016-11-16 ENCOUNTER — Ambulatory Visit: Payer: Medicare Other | Admitting: Emergency Medicine

## 2016-11-28 ENCOUNTER — Encounter: Payer: Self-pay | Admitting: *Deleted

## 2016-11-28 ENCOUNTER — Encounter: Payer: Self-pay | Admitting: Physician Assistant

## 2016-11-28 ENCOUNTER — Ambulatory Visit (INDEPENDENT_AMBULATORY_CARE_PROVIDER_SITE_OTHER): Payer: Medicare Other | Admitting: Physician Assistant

## 2016-11-28 VITALS — BP 126/76 | HR 83 | Ht 71.0 in | Wt 239.0 lb

## 2016-11-28 DIAGNOSIS — E7800 Pure hypercholesterolemia, unspecified: Secondary | ICD-10-CM

## 2016-11-28 DIAGNOSIS — I4891 Unspecified atrial fibrillation: Secondary | ICD-10-CM

## 2016-11-28 DIAGNOSIS — R072 Precordial pain: Secondary | ICD-10-CM | POA: Diagnosis not present

## 2016-11-28 DIAGNOSIS — I10 Essential (primary) hypertension: Secondary | ICD-10-CM | POA: Diagnosis not present

## 2016-11-28 DIAGNOSIS — I712 Thoracic aortic aneurysm, without rupture, unspecified: Secondary | ICD-10-CM

## 2016-11-28 DIAGNOSIS — I25118 Atherosclerotic heart disease of native coronary artery with other forms of angina pectoris: Secondary | ICD-10-CM | POA: Diagnosis not present

## 2016-11-28 MED ORDER — ROSUVASTATIN CALCIUM 5 MG PO TABS: 5.0000 mg | ORAL_TABLET | Freq: Every day | ORAL | 6 refills | Status: DC

## 2016-11-28 MED ORDER — LISINOPRIL 5 MG PO TABS: 5.0000 mg | ORAL_TABLET | Freq: Every day | ORAL | 6 refills | Status: DC

## 2016-11-28 MED ORDER — NITROGLYCERIN 0.4 MG SL SUBL: 0.4000 mg | SUBLINGUAL_TABLET | SUBLINGUAL | 3 refills | Status: DC | PRN

## 2016-11-28 NOTE — Patient Instructions (Signed)
Your physician recommends that you schedule a follow-up appointment in: 3-4 Months Dr. Domenic Polite.  Your physician recommends that you continue on your current medications as directed. Please refer to the Current Medication list given to you today.  Your physician has requested that you have a lexiscan myoview. For further information please visit [email or web address]. Please follow instruction sheet, as given.  If you need a refill on your cardiac medications before your next appointment, please call your pharmacy.  Thank you for choosing [facility]!

## 2016-11-28 NOTE — Progress Notes (Signed)
Cardiology Office Note    Date:  11/28/2016   ID:  [name], DOB 07/16/1941, MRN [id number]  PCP:  Shirline Frees, MD  Cardiologist: Dr. Aundra Dubin, now Dr. Domenic Polite  Chief Complaint  Patient presents with  . Follow-up    History of Present Illness:  [name] is a 75 y.o. male with hypertension, hyperlipidemia, paroxysmal atrial fibrillation, OSA on CPAP and coronary artery disease s/p CABG in [id number], chronic diastolic CHF.   He had a Lexiscan Cardiolite in 8/15 showing small area of scar in the distal LAD territory, no ischemia, EF 57%, low risk overall.    Patient had atrial fibrillation with RVR in 1/11 converted back to NSR spontaneously. Patient was on Pradaxa for a while but stopped it. He has had no documented atrial fibrillation recurrence. He has had persistent exertional dyspnea. He is followed by pulmonary. No atrial fibrillation on 3-week event monitor in 2/12. In the past,  He has declined anticoagulation in the past.   He has had a known ascending aortic aneurysm that has been followed by Dr. Aundra Dubin since 2013 when the aortic root was noted to be 4.4 cm and the ascending aorta 4.2 cm by MRA. An MRA in 04/2013 and 05/2014 were unchanged. He was undergoing evaluation by pulmonary medicine for chronic exertional dyspnea last year and a high resolution CT of the chest showed the ascending aorta to be 4.6 cm.Followed by Dr. Cyndia Bent and stable in 08/2016.  Here for yearly check up. Monday night got upset about something and developed chest tightness relieved with 1 NTG. Lasted about 5 min. This is the first time since his CABG. No associated dyspnea, dizziness or presyncope. He doesn't exercise other than doing the house work. No exertional symptoms. Has occasional palpitations but no fast heart rates. Once to be followed by Dr. Domenic Polite in the [place] office.    Past Medical History:  Diagnosis Date  . Atrial fibrillation ([place])    previously on Multaq  . BPH  (benign prostatic hyperplasia)   . CAD (coronary artery disease)    s/p CABG 2007; NSTEMI in setting of AFib with RVR 12/2009; cath 1/11: S-RCA ok with prox 30-40% and 50% stenoses; S-OM and RI  with patent OM limb but an occluded RI limb; S-D2 and L-LAD ok; EF 55%  . Colon cancer ([place]) 1992  . Diastolic dysfunction    Echo 1/11:  EF 55-60%; mild LVH; focal basal septal hypertrophy; grade 1 diast.dysfxn; mild BAE and RVE  . Diverticulosis   . Dyspnea   . GERD (gastroesophageal reflux disease)   . HTN (hypertension)   . Hyperlipidemia   . 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 OSA on CPAP   . PE (pulmonary embolism) 2007   after CABG  . Personal history of colonic polyps 01/22/2000   TUBULAR ADENOMA  . RBBB (right bundle branch block)     Past Surgical History:  Procedure Laterality Date  . COLECTOMY  1992   w/ colostomy  related to colon cancer  . CORONARY ARTERY BYPASS GRAFT  2007  . HAND LIGAMENT RECONSTRUCTION  1963 1964   tendon repair and nerve; left  . INGUINAL HERNIA REPAIR  2009  . LAPAROSCOPIC CHOLECYSTECTOMY  2004  . VENTRAL HERNIA REPAIR  2009    Current Medications: Outpatient Medications Prior to Visit  Medication Sig Dispense Refill  . aspirin 325 MG EC tablet Take 325 mg by mouth daily.      Marland Kitchen  fish oil-omega-3 fatty acids 1000 MG capsule Take 1,200 g by mouth daily.     . furosemide (LASIX) 40 MG tablet TAKE ONE TABLET BY MOUTH ONCE DAILY AS NEEDED FOR  FLUID OR EDEMA 30 tablet 0  . Garlic 123XX123 MG TABS Take 1,000 mg by mouth daily.     Marland Kitchen HYDROcodone-acetaminophen (NORCO/VICODIN) 5-325 MG tablet Take 1 tablet by mouth every 6 (six) hours as needed for moderate pain.    Marland Kitchen lisinopril (PRINIVIL,ZESTRIL) 5 MG tablet Take 1 tablet (5 mg total) by mouth daily. 30 tablet 1  . metoprolol succinate (TOPROL-XL) 25 MG 24 hr tablet TAKE ONE TABLET BY MOUTH ONCE DAILY **TAKE EXTRA AS NEEDED FOR PALPITATION AND NEEDS TO CALL AND MAKE APPOINTMENT** 45 tablet 1  . Multiple  Vitamin (MULTIVITAMIN) tablet Take 1 tablet by mouth daily.      . naproxen sodium (ANAPROX) 220 MG tablet Take 220 mg by mouth 2 (two) times daily with a meal.    . omeprazole (PRILOSEC) 20 MG capsule Take 1 capsule by mouth daily.    . rosuvastatin (CRESTOR) 5 MG tablet Take 1 tablet (5 mg total) by mouth daily. 30 tablet 1  . tamsulosin (FLOMAX) 0.4 MG CAPS capsule Take 1 capsule (0.4 mg total) by mouth daily. 90 capsule 3  . nitroGLYCERIN (NITROSTAT) 0.4 MG SL tablet Place 1 tablet (0.4 mg total) under the tongue every 5 (five) minutes as needed for chest pain. 25 tablet prn   No facility-administered medications prior to visit.      Allergies:   Patient has no known allergies.   Social History   Social History  . Marital status: Married    Spouse name: N/A  . Number of children: 4  . Years of education: N/A   Occupational History  . 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 Smoking status: Former Smoker    Packs/day: 1.00    Years: 45.00    Types: Cigarettes    Quit date: 12/18/1995  . Smokeless tobacco: Never Used  . Alcohol use No     Comment: quit in 1980  . Drug use: No  . Sexual activity: Not Asked   Other Topics Concern  . None   Social History Narrative   Daily caffeine      Family History:  The patient's   family history includes Breast cancer in his sister and sister; Colon cancer in his sister; Emphysema in his father; Heart disease in his father and mother; Lung cancer in his mother.   ROS:   Please see the history of present illness.    Review of Systems  Constitution: Negative.  HENT: Negative.   Cardiovascular: Positive for chest pain.  Respiratory: Negative.   Endocrine: Negative.   Hematologic/Lymphatic: Negative.   Musculoskeletal: Positive for arthritis and joint pain.  Gastrointestinal: Negative.   Genitourinary: Negative.   Neurological: Negative.    All other systems reviewed and are negative.   PHYSICAL EXAM:    VS:  BP 126/76   Pulse 83   Ht 5\' 11"  (1.803 m)   Wt 239 lb (108.4 kg)   SpO2 93%   BMI 33.33 kg/m   Physical Exam  GEN: Obese, in no acute distress  Neck: no JVD, carotid bruits, or masses Cardiac:RRR; positive S4, no murmurs, rubs Respiratory:  clear to auscultation bilaterally, normal work of breathing GI: soft, nontender, nondistended, + BS Ext: without cyanosis, clubbing, or edema, Good  distal pulses bilaterally MS: no deformity or atrophy  Skin: warm and dry, no rash Psych: euthymic mood, full affect  Wt Readings from Last 3 Encounters:  11/28/16 239 lb (108.4 kg)  09/12/16 239 lb (108.4 kg)  08/14/16 239 lb (108.4 kg)      Studies/Labs Reviewed:   EKG:  EKG is  ordered today.  The ekg ordered today demonstrates Normal sinus rhythm with right bundle branch block and PVCs, no acute change from 08/2015 EKG  Recent Labs: No results found for requested labs within last 8760 hours.   Lipid Panel    Component Value Date/Time   CHOL 134 09/08/2015 1210   TRIG 211.0 (H) 09/08/2015 1210   HDL 36.10 (L) 09/08/2015 1210   CHOLHDL 4 09/08/2015 1210   VLDL 42.2 (H) 09/08/2015 1210   LDLCALC 59 07/23/2014 1638   LDLDIRECT 87.0 09/08/2015 1210    Additional studies/ records that were reviewed today include:  2-D echo 08/2015 Study Conclusions   - Left ventricle: The cavity size was normal. There was mild focal   basal hypertrophy of the septum. The estimated ejection fraction   was 55%. Doppler parameters are consistent with abnormal left   ventricular relaxation (grade 1 diastolic dysfunction). - Left atrium: The atrium was mildly dilated. - Right ventricle: The cavity size was mildly dilated. - Right atrium: The atrium was mildly dilated. - Atrial septum: No defect or patent foramen ovale was identified.   MRA of chest 08/2016 FINDINGS: VASCULAR   Aorta: Fusiform aneurysmal dilatation of the ascending thoracic aorta is similar to perhaps incrementally increased  in size at 4.4 cm. The aorta then tapers to normal caliber at the arch. The descending thoracic aorta is normal in caliber. No evidence of dissection. No dilatation of the aortic root or effacement of the sino-tubular junction. The descending thoracic aorta is tortuous and elongated.   Heart: The heart is normal in size.  No pericardial effusion.   Pulmonary Arteries: Patent and unremarkable main and central pulmonary arteries.   Other: Patient is status post median sternotomy.   NON-VASCULAR   No focal signal abnormality or abnormal enhancement in the mediastinum, lungs or musculoskeletal soft tissues.   IMPRESSION: VASCULAR   Stable to perhaps incrementally enlarged fusiform aneurysmal dilatation of the ascending thoracic aorta with a maximal diameter of 4.4 cm today compared to. 4.3 cm previously.   NON-VASCULAR   No significant abnormalities.   Signed,   Criselda Peaches, MD   [facility]   [facility]     Electronically Signed   By: Jacqulynn Cadet M.D.   On: 09/12/2016 14:15    ASSESSMENT:    1. Precordial pain   2. Atherosclerosis of native coronary artery with stable angina pectoris, unspecified whether native or transplanted heart ([place])   3. Atrial fibrillation, unspecified type ([place])   4. Essential hypertension   5. Thoracic aortic aneurysm without rupture ([place])   6. HYPERCHOLESTEROLEMIA      PLAN:  In order of problems listed above:  Chest pain consistent with angina when he got upset Monday night relieved with one nitroglycerin. This is his first episode of chest pain since his CABG in 2007. Recommend Lexi scan Myoview. He can't walk on the treadmill because of joint pain.  CAD status post CABG in 2007. Now with his first episode of chest pain. See above. Patient would like to be followed in the [place] office with Dr. Domenic Polite. We'll arrange follow-up in 3-4 months  unless abnormal stress  test. Call for recurrent chest pain.  PAF previously on Pradaxa but has not had recurrence in many years. Has refused anticoagulation in the past.  Essential hypertension controlled refill medications  Ascending aortic aneurysm 4.4 cm followed closely by Dr. Cyndia Bent and felt to be stable on MRA  in 08/2016  Hypercholesterolemia on Crestor and fish oil. Last lipid panel 08/2015. He is having blood work by primary care in March and would like to wait until then.    Medication Adjustments/Labs and Tests Ordered: Current medicines are reviewed at length with the patient today.  Concerns regarding medicines are outlined above.  Medication changes, Labs and Tests ordered today are listed in the Patient Instructions below. Patient Instructions  Your physician recommends that you schedule a follow-up appointment in: 3-4 Months Dr. Domenic Polite.  Your physician recommends that you continue on your current medications as directed. Please refer to the Current Medication list given to you today.  Your physician has requested that you have a lexiscan myoview. For further information please visit [email or web address]. Please follow instruction sheet, as given.  If you need a refill on your cardiac medications before your next appointment, please call your pharmacy.  Thank you for choosing [facility]!       Sumner Boast, PA-C  11/28/2016 2:37 PM    [facility] [place], [place],   [address] Phone: [phone]; Fax: [phone]

## 2016-12-03 ENCOUNTER — Encounter (HOSPITAL_COMMUNITY): Payer: Self-pay

## 2016-12-03 ENCOUNTER — Encounter (HOSPITAL_COMMUNITY)
Admission: RE | Admit: 2016-12-03 | Discharge: 2016-12-03 | Disposition: A | Discharge status: Home / Self Care | Payer: Medicare Other | Source: Ambulatory Visit | Attending: Physician Assistant | Admitting: Physician Assistant

## 2016-12-03 ENCOUNTER — Inpatient Hospital Stay (HOSPITAL_COMMUNITY): Admission: RE | Admit: 2016-12-03 | Payer: Medicare Other | Source: Ambulatory Visit

## 2016-12-03 DIAGNOSIS — R072 Precordial pain: Secondary | ICD-10-CM

## 2016-12-03 DIAGNOSIS — I259 Chronic ischemic heart disease, unspecified: Secondary | ICD-10-CM | POA: Insufficient documentation

## 2016-12-03 DIAGNOSIS — I252 Old myocardial infarction: Secondary | ICD-10-CM | POA: Insufficient documentation

## 2016-12-03 LAB — NM MYOCAR MULTI W/SPECT W/WALL MOTION / EF
CHL CUP NUCLEAR SSS: 5
CSEPPHR: 93 {beats}/min
LHR: 0.28
LVDIAVOL: 95 mL (ref 62–150)
LVSYSVOL: 46 mL
Rest HR: 62 {beats}/min
SDS: 3
SRS: 3
TID: 1.07

## 2016-12-03 MED ORDER — SODIUM CHLORIDE 0.9% FLUSH
INTRAVENOUS | Status: AC
  Administered 2016-12-03: 10 mL via INTRAVENOUS
  Filled 2016-12-03: qty 10

## 2016-12-03 MED ORDER — TECHNETIUM TC 99M TETROFOSMIN IV KIT
10.0000 | PACK | Freq: Once | INTRAVENOUS | Status: AC | PRN
  Administered 2016-12-03: 10.7 via INTRAVENOUS

## 2016-12-03 MED ORDER — REGADENOSON 0.4 MG/5ML IV SOLN
INTRAVENOUS | Status: AC
  Administered 2016-12-03: 0.4 mg via INTRAVENOUS
  Filled 2016-12-03: qty 5

## 2016-12-03 MED ORDER — TECHNETIUM TC 99M TETROFOSMIN IV KIT
30.0000 | PACK | Freq: Once | INTRAVENOUS | Status: AC | PRN
  Administered 2016-12-03: 31 via INTRAVENOUS

## 2016-12-07 ENCOUNTER — Telehealth: Payer: Self-pay

## 2016-12-07 MED ORDER — LISINOPRIL 5 MG PO TABS: 5.0000 mg | ORAL_TABLET | Freq: Every day | ORAL | 3 refills | Status: DC

## 2016-12-07 MED ORDER — ISOSORBIDE MONONITRATE ER 30 MG PO TB24: 30.0000 mg | ORAL_TABLET | Freq: Every day | ORAL | 3 refills | Status: DC

## 2016-12-07 MED ORDER — METOPROLOL SUCCINATE ER 25 MG PO TB24: ORAL_TABLET | ORAL | 3 refills | Status: DC

## 2016-12-07 MED ORDER — ROSUVASTATIN CALCIUM 5 MG PO TABS: 5.0000 mg | ORAL_TABLET | Freq: Every day | ORAL | 3 refills | Status: DC

## 2016-12-07 NOTE — Telephone Encounter (Signed)
Pt notified, also requested that we change refills to 90 day supply,done

## 2016-12-07 NOTE — Telephone Encounter (Signed)
-----   Message from Imogene Burn, PA-C sent at 12/06/2016  4:38 PM EST ----- Small defect on stress test. Reviewed with Dr. Domenic Polite who recommends medical therapy. Add Imdur 30 mg once daily. He may have a headache for a couple of days but it should go away. Can take tylenol if needed.

## 2016-12-31 ENCOUNTER — Telehealth: Payer: Self-pay | Admitting: Emergency Medicine

## 2016-12-31 DIAGNOSIS — G4733 Obstructive sleep apnea (adult) (pediatric): Secondary | ICD-10-CM

## 2016-12-31 NOTE — Telephone Encounter (Signed)
Called and spoke with pt and he stated that he did the loaner with [facility] and they sent the results over to [place] to advise on the setting of his cpap.  RB please advise so we may send in an order to [facility] for the pts setting for his cpap.  thanks

## 2017-01-09 NOTE — Telephone Encounter (Signed)
[name] with [name] returned call.  She states she will try to get the copy of the home CPAP titration study and have that faxed over to [place].

## 2017-01-09 NOTE — Telephone Encounter (Signed)
We do not have a copy of the home CPAP titration study. lmtcb x1 for [name] with [facility].

## 2017-01-10 NOTE — Telephone Encounter (Signed)
Let him know that based on his titration results I want to order CPAP 10 cmH2O, heated humidity, mask of choice / best fit. Order if her agrees - thanks.

## 2017-01-10 NOTE — Addendum Note (Signed)
Addended by: Elie Confer on: 01/10/2017 02:27 PM   Modules accepted: Orders

## 2017-01-10 NOTE — Telephone Encounter (Signed)
Called and lmom to make the pt aware of order that has been placed with [facility].  Nothing further is needed.

## 2017-01-10 NOTE — Telephone Encounter (Signed)
CPAP titration was received from [facility]. This has been placed in RB's "Review/FYI" folder.  RB - please advise. Thanks.

## 2017-01-15 ENCOUNTER — Other Ambulatory Visit: Payer: Self-pay | Admitting: Nurse Practitioner

## 2017-01-15 ENCOUNTER — Other Ambulatory Visit: Payer: Self-pay | Admitting: *Deleted

## 2017-01-15 MED ORDER — FUROSEMIDE 40 MG PO TABS: ORAL_TABLET | ORAL | 2 refills | Status: DC

## 2017-01-15 NOTE — Telephone Encounter (Signed)
furosemide (LASIX) 40 MG tablet  Medication  Date: 10/23/2016 Department: [address] Office Ordering/Authorizing: Burtis Junes, NP  Order Providers   Prescribing Provider Encounter Provider  Burtis Junes, NP Burtis Junes, NP  Medication Detail    Disp Refills Start End   furosemide (LASIX) 40 MG tablet 30 tablet 0 10/23/2016    Sig: TAKE ONE TABLET BY MOUTH ONCE DAILY AS NEEDED FOR FLUID OR EDEMA   Notes to Pharmacy: Patient needs office visit for any future refills. Please call our office at [phone] to schedule appointment.   E-Prescribing Status: Receipt confirmed by pharmacy (10/23/2016 11:53 AM EST)   Pharmacy   [facility], [place] - [id number]

## 2017-03-08 ENCOUNTER — Encounter: Payer: Self-pay | Admitting: Cardiology

## 2017-03-08 NOTE — Progress Notes (Signed)
Cardiology Office Note  Date: 03/11/2017   ID: [name], DOB Sep 29, 1941, MRN [id number]  PCP: Samuel Frees, MD  Primary Cardiologist: Samuel Lesches, MD   Chief Complaint  Patient presents with  . Coronary Artery Disease    History of Present Illness: [name] is a 76 y.o. male former patient of [name], last seen in the office by [name] in December 2017. This is our first meeting today. I reviewed extensive records and updated his chart. He presents today for follow-up. Describes occasional angina symptoms and also indigestion. He reports compliance with his cardiac regimen. Also has trouble with intermittent weight gain and increased shortness of breath, he takes Lasix 40 mg as needed. He weighs himself daily as well.  I reviewed his current cardiac regimen. We discussed adding a potassium supplement for the times that he takes Lasix, also possibly even putting his diuretic on a standing basis if he is using it frequently.   Last ischemic testing was in December 2017 as outlined below. He has been managed medically with CAD status post CABG in 2007. His last echocardiogram was in 2016.  Past Medical History:  Diagnosis Date  . BPH (benign prostatic hyperplasia)   . CAD (coronary artery disease)    s/p CABG 2007; NSTEMI in setting of AFib with RVR 12/2009; cath 1/11: S-RCA ok with prox 30-40% and 50% stenoses; S-OM and RI  with patent OM limb but an occluded RI limb; S-D2 and L-LAD ok; EF 55%  . Colon cancer ([place]) 1992  . Diverticulosis   . Essential hypertension   . GERD (gastroesophageal reflux disease)   . History of pulmonary embolus ([facility]) 2007   Following CABG  . Hyperlipidemia   . 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 OSA on CPAP   . Paroxysmal atrial fibrillation ([facility])    Previously on Multaq, declines anticoagulation  . Personal history of colonic polyps 01/22/2000   Tubular adenoma  . RBBB (right bundle branch block)     Past  Surgical History:  Procedure Laterality Date  . COLECTOMY  1992   w/ colostomy  related to colon cancer  . CORONARY ARTERY BYPASS GRAFT  2007  . HAND LIGAMENT RECONSTRUCTION  1963 1964   tendon repair and nerve; left  . INGUINAL HERNIA REPAIR  2009  . LAPAROSCOPIC CHOLECYSTECTOMY  2004  . VENTRAL HERNIA REPAIR  2009    Current Outpatient Prescriptions  Medication Sig Dispense Refill  . aspirin 325 MG EC tablet Take 325 mg by mouth daily.      . fish oil-omega-3 fatty acids 1000 MG capsule Take 1,200 g by mouth daily.     . furosemide (LASIX) 40 MG tablet TAKE ONE TABLET BY MOUTH ONCE DAILY AS NEEDED FOR FLUID OR EDEMA 90 tablet 2  . gabapentin (NEURONTIN) 100 MG capsule Take 100 mg by mouth 2 (two) times daily.    . Garlic 562 MG TABS Take 1,000 mg by mouth daily.     Marland Kitchen HYDROcodone-acetaminophen (NORCO/VICODIN) 5-325 MG tablet Take 1 tablet by mouth every 6 (six) hours as needed for moderate pain.    Marland Kitchen lisinopril (PRINIVIL,ZESTRIL) 5 MG tablet Take 1 tablet (5 mg total) by mouth daily. 90 tablet 3  . metoprolol succinate (TOPROL-XL) 25 MG 24 hr tablet TAKE ONE TABLET BY MOUTH ONCE DAILY **TAKE EXTRA AS NEEDED FOR PALPITATIONS   PT WANTED 90 DAY SUPPLY 90 tablet 3  . Multiple Vitamin (MULTIVITAMIN) tablet  Take 1 tablet by mouth daily.      . naproxen sodium (ANAPROX) 220 MG tablet Take 220 mg by mouth 2 (two) times daily with a meal.    . nitroGLYCERIN (NITROSTAT) 0.4 MG SL tablet Place 1 tablet (0.4 mg total) under the tongue every 5 (five) minutes as needed for chest pain. 25 tablet 3  . omeprazole (PRILOSEC) 20 MG capsule Take 1 capsule by mouth daily.    . rosuvastatin (CRESTOR) 5 MG tablet Take 1 tablet (5 mg total) by mouth daily. 90 tablet 3  . tamsulosin (FLOMAX) 0.4 MG CAPS capsule Take 1 capsule (0.4 mg total) by mouth daily. 90 capsule 3  . isosorbide mononitrate (IMDUR) 30 MG 24 hr tablet Take 1 tablet (30 mg total) by mouth daily. 90 tablet 3  . potassium chloride (K-DUR) 10  MEQ tablet Take 10 meq daily WHEN you take lasix 90 tablet 3   No current facility-administered medications for this visit.    Allergies:  Patient has no known allergies.   Social History: The patient  reports that he quit smoking about 21 years ago. His smoking use included Cigarettes. He has a 45.00 pack-year smoking history. He has never used smokeless tobacco. He reports that he does not drink alcohol or use drugs.   ROS:  Please see the history of present illness. Otherwise, complete review of systems is positive for none.  All other systems are reviewed and negative.   Physical Exam: VS:  BP 128/72   Pulse 86   Ht 5\' 11"  (1.803 m)   Wt 241 lb (109.3 kg)   SpO2 94%   BMI 33.61 kg/m , BMI Body mass index is 33.61 kg/m.  Wt Readings from Last 3 Encounters:  03/11/17 241 lb (109.3 kg)  11/28/16 239 lb (108.4 kg)  09/12/16 239 lb (108.4 kg)    General: Overweight male, appears comfortable at rest. HEENT: Conjunctiva and lids normal, oropharynx clear. Neck: Supple, no elevated JVP or carotid bruits, no thyromegaly. Lungs: Clear to auscultation, nonlabored breathing at rest. Cardiac: Regular rate and rhythm, no S3, soft systolic murmur, no pericardial rub. Abdomen: Soft, nontender, bowel sounds present, no guarding or rebound. Extremities: No pitting edema, distal pulses 2+. Skin: Warm and dry. Musculoskeletal: No kyphosis. Neuropsychiatric: Alert and oriented x3, affect grossly appropriate.  ECG: I personally reviewed the tracing from 11/28/2016 which showed sinus rhythm with PVCs and right bundle branch block.  Recent Labwork:    Component Value Date/Time   CHOL 134 09/08/2015 1210   TRIG 211.0 (H) 09/08/2015 1210   HDL 36.10 (L) 09/08/2015 1210   CHOLHDL 4 09/08/2015 1210   VLDL 42.2 (H) 09/08/2015 1210   LDLCALC 59 07/23/2014 1638   LDLDIRECT 87.0 09/08/2015 1210    Other Studies Reviewed Today:  Echocardiogram 09/16/2015: Study Conclusions  - Left ventricle:  The cavity size was normal. There was mild focal   basal hypertrophy of the septum. The estimated ejection fraction   was 55%. Doppler parameters are consistent with abnormal left   ventricular relaxation (grade 1 diastolic dysfunction). - Left atrium: The atrium was mildly dilated. - Right ventricle: The cavity size was mildly dilated. - Right atrium: The atrium was mildly dilated. - Atrial septum: No defect or patent foramen ovale was identified.  Lexiscan Myoview 12/03/2016:  There was no ST segment deviation noted during stress.  Defect 1: There is a medium defect of moderate severity present in the basal inferior, mid inferoseptal, mid inferior, mid inferolateral, apical  inferior and apical lateral location.  Findings consistent with prior myocardial infarction with a mild degree of peri-infarct ischemia.  This is an intermediate risk study.  Nuclear stress EF: 52%.  Assessment and Plan:  1. Multivessel CAD status post CABG in 2007, some graft disease documented in 2011, but otherwise with stable angina symptoms on medical therapy. Myoview study from December of last year was overall intermediate risk with evidence of inferior scar in mild peri-infarct ischemia. I did continue medical therapy and observation for now.  2. Intermittent shortness of breath and weight gain, apparent episodic acute on chronic diastolic heart failure. Continue to use Lasix as needed, adding potassium supplement. We will also update his echocardiogram.  3. Essential hypertension, blood pressure control is adequate today.  4. Hyperlipidemia, continues on Crestor. Follow-up lab work with Dr. Kenton Kingfisher.  Current medicines were reviewed with the patient today.   Orders Placed This Encounter  Procedures  . ECHOCARDIOGRAM COMPLETE    Disposition: Follow-up in 6 months, sooner if needed.  Signed, Satira Sark, MD, [facility] 03/11/2017 11:54 AM    [place] at [facility] [address] [place],  [address] Phone: [phone]; Fax: [phone]

## 2017-03-11 ENCOUNTER — Ambulatory Visit (INDEPENDENT_AMBULATORY_CARE_PROVIDER_SITE_OTHER): Payer: Medicare Other | Admitting: Cardiology

## 2017-03-11 ENCOUNTER — Encounter: Payer: Self-pay | Admitting: Cardiology

## 2017-03-11 VITALS — BP 128/72 | HR 86 | Ht 71.0 in | Wt 241.0 lb

## 2017-03-11 DIAGNOSIS — I25119 Atherosclerotic heart disease of native coronary artery with unspecified angina pectoris: Secondary | ICD-10-CM

## 2017-03-11 DIAGNOSIS — I5032 Chronic diastolic (congestive) heart failure: Secondary | ICD-10-CM | POA: Diagnosis not present

## 2017-03-11 DIAGNOSIS — I10 Essential (primary) hypertension: Secondary | ICD-10-CM

## 2017-03-11 DIAGNOSIS — E782 Mixed hyperlipidemia: Secondary | ICD-10-CM | POA: Diagnosis not present

## 2017-03-11 MED ORDER — ISOSORBIDE MONONITRATE ER 30 MG PO TB24: 30.0000 mg | ORAL_TABLET | Freq: Every day | ORAL | 3 refills | Status: DC

## 2017-03-11 MED ORDER — METOPROLOL SUCCINATE ER 25 MG PO TB24: ORAL_TABLET | ORAL | 3 refills | Status: DC

## 2017-03-11 MED ORDER — POTASSIUM CHLORIDE ER 10 MEQ PO TBCR: EXTENDED_RELEASE_TABLET | ORAL | 3 refills | Status: DC

## 2017-03-11 NOTE — Patient Instructions (Signed)
Your physician wants you to follow-up in: 6 months with Dr. Ferne Reus will receive a reminder letter in the mail two months in advance. If you don't receive a letter, please call our office to schedule the follow-up appointment.    Take potassium 10 meq on the days you take Lasix   Your physician has requested that you have an echocardiogram. Echocardiography is a painless test that uses sound waves to create images of your heart. It provides your doctor with information about the size and shape of your heart and how well your heart's chambers and valves are working. This procedure takes approximately one hour. There are no restrictions for this procedure.    If you need a refill on your cardiac medications before your next appointment, please call your pharmacy.   Thank you for choosing [facility] !

## 2017-03-19 ENCOUNTER — Ambulatory Visit (HOSPITAL_COMMUNITY)
Admission: RE | Admit: 2017-03-19 | Discharge: 2017-03-19 | Disposition: A | Discharge status: Home / Self Care | Payer: Medicare Other | Source: Ambulatory Visit | Attending: Cardiology | Admitting: Cardiology

## 2017-03-19 DIAGNOSIS — I4891 Unspecified atrial fibrillation: Secondary | ICD-10-CM | POA: Insufficient documentation

## 2017-03-19 DIAGNOSIS — I509 Heart failure, unspecified: Secondary | ICD-10-CM | POA: Insufficient documentation

## 2017-03-19 DIAGNOSIS — I071 Rheumatic tricuspid insufficiency: Secondary | ICD-10-CM | POA: Diagnosis not present

## 2017-03-19 DIAGNOSIS — G4733 Obstructive sleep apnea (adult) (pediatric): Secondary | ICD-10-CM | POA: Insufficient documentation

## 2017-03-19 DIAGNOSIS — Z86711 Personal history of pulmonary embolism: Secondary | ICD-10-CM | POA: Diagnosis not present

## 2017-03-19 DIAGNOSIS — E785 Hyperlipidemia, unspecified: Secondary | ICD-10-CM | POA: Insufficient documentation

## 2017-03-19 DIAGNOSIS — Z951 Presence of aortocoronary bypass graft: Secondary | ICD-10-CM | POA: Insufficient documentation

## 2017-03-19 DIAGNOSIS — I252 Old myocardial infarction: Secondary | ICD-10-CM | POA: Diagnosis not present

## 2017-03-19 DIAGNOSIS — I25119 Atherosclerotic heart disease of native coronary artery with unspecified angina pectoris: Secondary | ICD-10-CM | POA: Diagnosis not present

## 2017-03-19 DIAGNOSIS — I110 Hypertensive heart disease with heart failure: Secondary | ICD-10-CM | POA: Insufficient documentation

## 2017-03-19 LAB — ECHOCARDIOGRAM COMPLETE
AVLVOTPG: 3 mmHg
E decel time: 391 msec
E/e' ratio: 5.76
FS: 36 % (ref 28–44)
IVS/LV PW RATIO, ED: 1.35
LA vol A4C: 42.4 ml
LADIAMINDEX: 1.52 cm/m2
LASIZE: 36 mm
LEFT ATRIUM END SYS DIAM: 36 mm
LV PW d: 12.5 mm — AB (ref 0.6–1.1)
LV SIMPSON'S DISK: 51
LV TDI E'LATERAL: 7.07
LV dias vol index: 25 mL/m2
LV dias vol: 59 mL — AB (ref 62–150)
LV e' LATERAL: 7.07 cm/s
LV sys vol: 29 mL (ref 21–61)
LVEEAVG: 5.76
LVEEMED: 5.76
LVOT VTI: 13.1 cm
LVOT area: 4.52 cm2
LVOT diameter: 24 mm
LVOTPV: 84.9 cm/s
LVOTSV: 59 mL
LVSYSVOLIN: 12 mL/m2
Lateral S' vel: 8.59 cm/s
MV Dec: 391
MVPKAVEL: 97.7 m/s
MVPKEVEL: 40.7 m/s
Stroke v: 30 ml
TAPSE: 13.4 mm
TDI e' medial: 11.3

## 2017-03-19 NOTE — Progress Notes (Signed)
*  PRELIMINARY RESULTS* Echocardiogram 2D Echocardiogram has been performed.  [name] 03/19/2017, 12:23 PM

## 2017-03-20 ENCOUNTER — Telehealth: Payer: Self-pay | Admitting: *Deleted

## 2017-03-20 NOTE — Telephone Encounter (Signed)
Called patient with test results. No answer. Left message to call back.  

## 2017-03-20 NOTE — Telephone Encounter (Signed)
-----   Message from Satira Sark, MD sent at 03/20/2017  9:24 AM EDT ----- Results reviewed. No decreased in LVEF (55-60%) and mild diastolic dysfunction. Continue with current regimen. A copy of this test should be forwarded to Shirline Frees, MD.

## 2017-03-22 ENCOUNTER — Telehealth: Payer: Self-pay | Admitting: Cardiology

## 2017-03-22 MED ORDER — ISOSORBIDE MONONITRATE ER 30 MG PO TB24: 30.0000 mg | ORAL_TABLET | Freq: Every day | ORAL | 3 refills | Status: DC

## 2017-03-22 NOTE — Telephone Encounter (Signed)
Hold Imdur and lisinopril going forward.

## 2017-03-22 NOTE — Telephone Encounter (Signed)
Patient states he feels "washed out".Took BP 2 hrs ago and had 86/58 with HR 80. Repeated BP 15 minutes ago and had 98/56, HR 71. He has already taken his Toprol XL and Imdur. He denies near syncope, dizziness etc   Will send to [facility], Dr Bronson Ing

## 2017-03-22 NOTE — Telephone Encounter (Signed)
Patient is having problems with low BP and HR. / tg

## 2017-03-22 NOTE — Telephone Encounter (Signed)
Patient notified to HOLD Imdur and lisinopril going forward, [facility] pharmacy made aware also

## 2017-03-22 NOTE — Addendum Note (Signed)
Addended by: Barbarann Ehlers A on: 03/22/2017 04:21 PM   Modules accepted: Orders

## 2017-04-01 ENCOUNTER — Ambulatory Visit (INDEPENDENT_AMBULATORY_CARE_PROVIDER_SITE_OTHER): Payer: Medicare Other | Admitting: Otolaryngology

## 2017-04-01 DIAGNOSIS — H903 Sensorineural hearing loss, bilateral: Secondary | ICD-10-CM

## 2017-04-01 DIAGNOSIS — H9313 Tinnitus, bilateral: Secondary | ICD-10-CM | POA: Diagnosis not present

## 2017-04-11 ENCOUNTER — Ambulatory Visit (INDEPENDENT_AMBULATORY_CARE_PROVIDER_SITE_OTHER): Payer: Medicare Other

## 2017-04-11 ENCOUNTER — Encounter (INDEPENDENT_AMBULATORY_CARE_PROVIDER_SITE_OTHER): Payer: Self-pay | Admitting: Orthopaedic Surgery

## 2017-04-11 ENCOUNTER — Ambulatory Visit (INDEPENDENT_AMBULATORY_CARE_PROVIDER_SITE_OTHER): Payer: Medicare Other | Admitting: Orthopaedic Surgery

## 2017-04-11 VITALS — BP 137/96 | HR 89 | Resp 14 | Ht 71.0 in | Wt 230.0 lb

## 2017-04-11 DIAGNOSIS — M25562 Pain in left knee: Secondary | ICD-10-CM

## 2017-04-11 MED ORDER — LIDOCAINE HCL 1 % IJ SOLN
5.0000 mL | INTRAMUSCULAR | Status: AC | PRN
  Administered 2017-04-11: 5 mL

## 2017-04-11 MED ORDER — METHYLPREDNISOLONE ACETATE 40 MG/ML IJ SUSP
80.0000 mg | INTRAMUSCULAR | Status: AC | PRN
  Administered 2017-04-11: 80 mg

## 2017-04-11 MED ORDER — BUPIVACAINE HCL 0.5 % IJ SOLN
3.0000 mL | INTRAMUSCULAR | Status: AC | PRN
  Administered 2017-04-11: 3 mL via INTRA_ARTICULAR

## 2017-04-11 NOTE — Progress Notes (Signed)
Office Visit Note   Patient: [name]           Date of Birth: 1941/06/15           MRN: [id number] Visit Date: 04/11/2017              Requested by: Shirline Frees, MD [address], [place] [id number] PCP: Shirline Frees, MD   Assessment & Plan: Visit Diagnoses:  1. Acute pain of left knee    osteoarthritis medial compartment left knee  Plan: Cortisone injection medial compartment left knee, follow-up in one month if no improvement. Would consider MRI scan at that point  Follow-Up Instructions: Return if symptoms worsen or fail to improve.   Orders:  Orders Placed This Encounter  Procedures  . XR Pelvis 1-2 Views  . XR KNEE 3 VIEW LEFT   No orders of the defined types were placed in this encounter.     Procedures: Large Joint Inj Date/Time: 04/11/2017 12:20 PM Performed by: Garald Balding Authorized by: Garald Balding   Consent Given by:  Patient Timeout: prior to procedure the correct patient, procedure, and site was verified   Indications:  Pain and joint swelling Location:  Knee Site:  L knee Prep: patient was prepped and draped in usual sterile fashion   Needle Size:  25 G Needle Length:  1.5 inches Approach:  Anteromedial Ultrasound Guidance: No   Fluoroscopic Guidance: No   Arthrogram: No   Medications:  5 mL lidocaine 1 %; 80 mg methylPREDNISolone acetate 40 MG/ML; 3 mL bupivacaine 0.5 % Aspiration Attempted: No   Patient tolerance:  Patient tolerated the procedure well with no immediate complications     Clinical Data: No additional findings.   Subjective: Chief Complaint  Patient presents with  . Left Knee - Pain    [name] is a 76 y o that presents with Left knee pain x 2 weeks. Denies injury, no fever, chills or swelling. Pt takes hyropcodone for 3 bulging  Disc (Dr. 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  [name] noted acute onset of left knee pain  approximately 2 weeks ago. He denies any obvious history of injury or trauma. He's had "some" swelling but no increased heat, erythema or ecchymosis. He is not having any sensation of his knee giving way. There has been no swelling distally. He feels his pain is  different from any problems he has had  previously  from his back.  HPI  Review of Systems   Objective: Vital Signs: BP (!) 137/96   Pulse 89   Resp 14   Ht 5\' 11"  (1.803 m)   Wt 230 lb (104.3 kg)   BMI 32.08 kg/m   Physical Exam  Ortho Exam straight leg raise negative bilaterally. Painless range of motion of both hips with internal and external rotation. Left knee without effusion. Diffuse medial joint tenderness. Minimal patellar crepitation without pain. No lateral joint tenderness. Full extension and over 105 of flexion without instability. No popliteal pain. No distal edema. Neurovascular exam intact distally.  Specialty Comments:  No specialty comments available.  Imaging: Xr Knee 3 View Left  Result Date: 04/11/2017 Films of the left knee were obtained in 3 projections standing. The joint spaces are well maintained. No ectopic calcification. No malalignment. There is some increased subchondral sclerosis in the medial femoral condyle associated with some faint rare fraction of the subchondral bone in other  locations of the medial femoral condyle  Xr Pelvis 1-2 Views  Result Date: 04/11/2017 AP the pelvis was obtained demonstrating some mild degenerative changes in both hips. Joint spaces slightly narrowed on the right compared to the left. There is vascular calcification. No evidence of fracture    PMFS History: Patient Active Problem List   Diagnosis Date Noted  . Bad posture 09/08/2014  . Leg weakness, bilateral 09/08/2014  . Anemia 03/07/2012  . Other general symptoms(780.99) 03/07/2012  . GERD (gastroesophageal reflux disease) 03/07/2012  . Sleep apnea 03/07/2012  . Personal history of malignant neoplasm  of rectum, rectosigmoid junction, and anus 03/07/2012  . Hx of CABG 03/07/2012  . Chronic diastolic heart failure ([place]) 12/06/2011  . Fatigue 04/03/2011  . Aortic aneurysm ([place]) 04/03/2011  . SYNCOPE 02/21/2011  . PALPITATIONS 02/21/2011  . BRADYCARDIA 01/22/2011  . ATRIAL FIBRILLATION 01/23/2010  . Obstructive sleep apnea 01/16/2010  . DYSPNEA 05/12/2009  . HYPERCHOLESTEROLEMIA 03/22/2009  . HYPERLIPIDEMIA 03/22/2009  . OBESITY 03/22/2009  . Essential hypertension 03/22/2009  . Coronary atherosclerosis 03/22/2009  . BUNDLE BRANCH BLOCK, RIGHT 03/22/2009  . GERD 03/22/2009  . BENIGN PROSTATIC HYPERTROPHY, HX OF 03/22/2009   Past Medical History:  Diagnosis Date  . BPH (benign prostatic hyperplasia)   . CAD (coronary artery disease)    s/p CABG 2007; NSTEMI in setting of AFib with RVR 12/2009; cath 1/11: S-RCA ok with prox 30-40% and 50% stenoses; S-OM and RI  with patent OM limb but an occluded RI limb; S-D2 and L-LAD ok; EF 55%  . Colon cancer ([place]) 1992  . Diverticulosis   . Essential hypertension   . GERD (gastroesophageal reflux disease)   . History of pulmonary embolus (PE) 2007   Following CABG  . Hyperlipidemia   . 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 OSA on CPAP   . Paroxysmal atrial fibrillation ([facility])    Previously on Multaq, declines anticoagulation  . Personal history of colonic polyps 01/22/2000   Tubular adenoma  . RBBB (right bundle branch block)     Family History  Problem Relation Age of Onset  . Lung cancer Mother   . Heart disease Mother   . Emphysema Father   . Heart disease Father   . Colon cancer Sister   . Breast cancer Sister   . Breast cancer Sister     Past Surgical History:  Procedure Laterality Date  . COLECTOMY  1992   w/ colostomy  related to colon cancer  . CORONARY ARTERY BYPASS GRAFT  2007  . HAND LIGAMENT RECONSTRUCTION  1963 1964   tendon repair and nerve; left  . INGUINAL HERNIA REPAIR  2009  . LAPAROSCOPIC CHOLECYSTECTOMY   2004  . VENTRAL HERNIA REPAIR  2009   Social History   Occupational History  . 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 Smoking status: Former Smoker    Packs/day: 1.00    Years: 45.00    Types: Cigarettes    Quit date: 12/18/1995  . Smokeless tobacco: Never Used  . Alcohol use No     Comment: quit in 1980  . Drug use: No  . Sexual activity: Not on file     Garald Balding, MD   Note - This record has been created using [employer].  Chart creation errors have been sought, but may not always  have been located. Such creation errors do not reflect on  the  standard of medical care.

## 2017-05-12 ENCOUNTER — Other Ambulatory Visit: Payer: Self-pay | Admitting: Physician Assistant

## 2017-06-13 ENCOUNTER — Other Ambulatory Visit (INDEPENDENT_AMBULATORY_CARE_PROVIDER_SITE_OTHER): Payer: Medicare Other

## 2017-06-13 ENCOUNTER — Ambulatory Visit (INDEPENDENT_AMBULATORY_CARE_PROVIDER_SITE_OTHER): Payer: Medicare Other | Admitting: Emergency Medicine

## 2017-06-13 ENCOUNTER — Encounter: Payer: Self-pay | Admitting: Emergency Medicine

## 2017-06-13 ENCOUNTER — Telehealth: Payer: Self-pay | Admitting: *Deleted

## 2017-06-13 VITALS — BP 134/80 | HR 88 | Ht 71.0 in | Wt 240.0 lb

## 2017-06-13 DIAGNOSIS — I480 Paroxysmal atrial fibrillation: Secondary | ICD-10-CM

## 2017-06-13 DIAGNOSIS — R531 Weakness: Secondary | ICD-10-CM | POA: Diagnosis not present

## 2017-06-13 DIAGNOSIS — I5032 Chronic diastolic (congestive) heart failure: Secondary | ICD-10-CM | POA: Diagnosis not present

## 2017-06-13 LAB — CBC WITH DIFFERENTIAL/PLATELET
BASOS PCT: 0.4 % (ref 0.0–3.0)
Basophils Absolute: 0 10*3/uL (ref 0.0–0.1)
EOS PCT: 2.1 % (ref 0.0–5.0)
Eosinophils Absolute: 0.1 10*3/uL (ref 0.0–0.7)
HCT: 43.4 % (ref 39.0–52.0)
HEMOGLOBIN: 14.2 g/dL (ref 13.0–17.0)
LYMPHS ABS: 2.1 10*3/uL (ref 0.7–4.0)
Lymphocytes Relative: 37.1 % (ref 12.0–46.0)
MCHC: 32.8 g/dL (ref 30.0–36.0)
MCV: 85.7 fl (ref 78.0–100.0)
MONO ABS: 0.6 10*3/uL (ref 0.1–1.0)
MONOS PCT: 11 % (ref 3.0–12.0)
NEUTROS PCT: 49.4 % (ref 43.0–77.0)
Neutro Abs: 2.8 10*3/uL (ref 1.4–7.7)
Platelets: 183 10*3/uL (ref 150.0–400.0)
RBC: 5.06 Mil/uL (ref 4.22–5.81)
RDW: 16.1 % — AB (ref 11.5–15.5)
WBC: 5.7 10*3/uL (ref 4.0–10.5)

## 2017-06-13 LAB — BASIC METABOLIC PANEL
BUN: 13 mg/dL (ref 6–23)
CHLORIDE: 105 meq/L (ref 96–112)
CO2: 28 mEq/L (ref 19–32)
CREATININE: 1.21 mg/dL (ref 0.40–1.50)
Calcium: 9.4 mg/dL (ref 8.4–10.5)
GFR: 61.83 mL/min (ref >60.00)
GLUCOSE: 74 mg/dL (ref 70–99)
POTASSIUM: 4.2 meq/L (ref 3.5–5.1)
Sodium: 140 mEq/L (ref 135–145)

## 2017-06-13 LAB — TSH: TSH: 1.63 u[IU]/mL (ref 0.35–4.50)

## 2017-06-13 LAB — CORTISOL: CORTISOL PLASMA: 5.2 ug/dL

## 2017-06-13 NOTE — Telephone Encounter (Signed)
Patient c/o SOB, heart skipping and fatigue. Seeing pulmonologist today in [place]. No c/o fever,chills, n/v, swelling, chest pain or dizziness. Patient was in a hurry while on the phone d/t an appt with his pulmonologist today in [place]. Offered an appointment with an extender and patient refused and took the first available appointment with Dr. Domenic Polite. Patient advised that if his symptoms got worse that he needed to go to the ED for an evaluation. Patient verbalized understanding of plan.

## 2017-06-13 NOTE — Patient Instructions (Signed)
[facility] now Lab work today Keep your appointment with Dr McDowell's off on 06/27/17. We will not repeat your pulmonary function testing at this time.  We will not add an inhaled medication at this time.  If your breathing worsens please call us. If you develop severe shortness of breath, weakness then please go to the ED.  Otherwise follow with Dr Lamonte Sakai in 6 months or sooner if you have any problems

## 2017-06-13 NOTE — Progress Notes (Signed)
Subjective:    Patient ID: [name], male    DOB: 26-Oct-1941, 76 y.o.   MRN: [id number]  Shortness of Breath  Pertinent negatives include no ear pain, fever, headaches, leg swelling, rash, rhinorrhea, sore throat, vomiting or wheezing.   76 year old former smoker (45-pack-years) with a history of coronary artery disease/CABG, atrial fibrillation, obstructive sleep apnea unable to tolerate CPAP. He is referred today for dyspnea. Has been longstanding, worse over 5 years. I reviewed his cardiology notes, prior PFT from 02/07/10 that show mild AFL, hyperinflation. He underwent a cardiac stress test 07/29/14 > reassuring.  He experiences exertional SOB, minimal cough, no wheeze. He has put on some wt about 10 lbs over 6 month. He still gardens, walks.   ROV 08/08/15 -- follows up for dyspnea and hx tobacco, prior PFT with mild obstruction. He underwent repeat PFT on 07/21/15 that I have personally reviewed. These show mild mixed disease, primarily restriction, without a BD response. His lung volumes are normal. DLCO is decreased but corrects for Va.  History is taken from him and his wife. He has had stable exertional dyspnea since last visit. No CP, cough, sputum.   ROV 08/14/16 -- This is a follow-up visit For patient with a history of tobacco use and mild obstruction on pulmonary function testing, also with obstructive sleep apnea not able to tolerate CPAP. He is followed by cardiology for coronary disease, CABG, paroxysmal fibrillation with diastolic CHF. He also has a history of an aortic root aneurysm. 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 We had discussed restarting CPAP - he is willing and ready to do so now. He is fatigued all the time, his wife has witnessed apneas. Originally dx at [facility] in 2009.   ROV 06/13/17 -- Patient has history of coronary disease/CABG, atrial fibrillation, diastolic  CHF. He also has obstructive lung disease based on spirometry, untreated obstructive sleep apnea. He presents today for an acute visit. About 2 weeks ago began weakness, some SOB with exertion. He has felt some palpitations and irregular heart beat. His wt has ben stable around 235 lbs. We restarted CPAP, he has worked on better compliance but hasn';t used it for the last month. Has nasal pillows. He notes that no new meds no changes to meds.     Review of Systems  Constitutional: Positive for fatigue. Negative for fever and unexpected weight change.  HENT: Negative for congestion, dental problem, ear pain, nosebleeds, postnasal drip, rhinorrhea, sinus pressure, sneezing, sore throat and trouble swallowing.   Eyes: Negative for redness and itching.  Respiratory: Negative for cough, chest tightness, shortness of breath and wheezing.   Cardiovascular: Negative for palpitations and leg swelling.  Gastrointestinal: Negative for nausea and vomiting.  Genitourinary: Negative for dysuria.  Musculoskeletal: Negative for joint swelling and myalgias.  Skin: Negative for rash.  Neurological: Negative for headaches.  Hematological: Does not bruise/bleed easily.  Psychiatric/Behavioral: Negative for dysphoric mood. The patient is not nervous/anxious.    Past Medical History:  Diagnosis Date  . BPH (benign prostatic hyperplasia)   . CAD (coronary artery disease)    s/p CABG 2007; NSTEMI in setting of AFib with RVR 12/2009; cath 1/11: S-RCA ok with prox 30-40% and 50% stenoses; S-OM and RI  with patent OM limb but an occluded RI limb; S-D2 and L-LAD ok; EF 55%  . Colon cancer ([place]) 1992  . Diverticulosis   .  Essential hypertension   . GERD (gastroesophageal reflux disease)   . History of pulmonary embolus (PE) 2007   Following CABG  . Hyperlipidemia   . 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 OSA on CPAP   . Paroxysmal atrial fibrillation ([facility])    Previously on Multaq, declines anticoagulation  .  Personal history of colonic polyps 01/22/2000   Tubular adenoma  . RBBB (right bundle branch block)      Family History  Problem Relation Age of Onset  . Lung cancer Mother   . Heart disease Mother   . Emphysema Father   . Heart disease Father   . Colon cancer Sister   . Breast cancer Sister   . Breast cancer Sister      Social History   Social History  . Marital status: Married    Spouse name: N/A  . Number of children: 4  . Years of education: N/A   Occupational History  . 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 Smoking status: Former Smoker    Packs/day: 1.00    Years: 45.00    Types: Cigarettes    Quit date: 12/18/1995  . Smokeless tobacco: Never Used  . Alcohol use No     Comment: quit in 1980  . Drug use: No  . Sexual activity: Not on file   Other Topics Concern  . Not on file   Social History Narrative   Daily caffeine   he has been exposed to animals - hogs, mules.    No Known Allergies   Outpatient Medications Prior to Visit  Medication Sig Dispense Refill  . aspirin 325 MG EC tablet Take 325 mg by mouth daily.      . fish oil-omega-3 fatty acids 1000 MG capsule Take 1,200 g by mouth daily.     . furosemide (LASIX) 40 MG tablet TAKE ONE TABLET BY MOUTH ONCE DAILY AS NEEDED FOR FLUID OR EDEMA 90 tablet 2  . gabapentin (NEURONTIN) 100 MG capsule Take 100 mg by mouth 2 (two) times daily.    . Garlic 664 MG TABS Take 1,000 mg by mouth daily.     Marland Kitchen HYDROcodone-acetaminophen (NORCO/VICODIN) 5-325 MG tablet Take 1 tablet by mouth every 6 (six) hours as needed for moderate pain.    . isosorbide mononitrate (IMDUR) 30 MG 24 hr tablet Take 1 tablet (30 mg total) by mouth daily. 03/22/17 On HOLD due to hypotension per K[place]waran 90 tablet 3  . lisinopril (PRINIVIL,ZESTRIL) 5 MG tablet Take 1 tablet (5 mg total) by mouth daily. 90 tablet 3  . metoprolol succinate (TOPROL-XL) 25 MG 24 hr tablet TAKE ONE TABLET BY MOUTH ONCE DAILY **TAKE  EXTRA AS NEEDED FOR PALPITATIONS   PT WANTED 90 DAY SUPPLY 90 tablet 3  . Multiple Vitamin (MULTIVITAMIN) tablet Take 1 tablet by mouth daily.      . naproxen sodium (ANAPROX) 220 MG tablet Take 220 mg by mouth 2 (two) times daily with a meal.    . nitroGLYCERIN (NITROSTAT) 0.4 MG SL tablet Place 1 tablet (0.4 mg total) under the tongue every 5 (five) minutes as needed for chest pain. 25 tablet 3  . omeprazole (PRILOSEC) 20 MG capsule Take 1 capsule by mouth daily.    . potassium chloride (K-DUR) 10 MEQ tablet Take 10 meq daily WHEN you take lasix 90 tablet 3  . rosuvastatin (CRESTOR) 5 MG tablet TAKE ONE TABLET BY MOUTH  ONCE DAILY 90 tablet 2  . tamsulosin (FLOMAX) 0.4 MG CAPS capsule Take 1 capsule (0.4 mg total) by mouth daily. 90 capsule 3  . rosuvastatin (CRESTOR) 5 MG tablet Take 1 tablet (5 mg total) by mouth daily. 90 tablet 3   No facility-administered medications prior to visit.          Objective:   Physical Exam Vitals:   06/13/17 1404  BP: 134/80  Pulse: 88  SpO2: 96%  Weight: 240 lb (108.9 kg)  Height: 5\' 11"  (1.803 m)   Gen: Pleasant, well-nourished, in no distress,  normal affect  ENT: No lesions,  mouth clear,  oropharynx clear, no postnasal drip  Neck: No JVD, no TMG, no carotid bruits  Lungs: No use of accessory muscles, no dullness to percussion, clear without rales or rhonchi  Cardiovascular: Regular, I do not appreciate any PVCs or irregularity.  Musculoskeletal: No deformities, no cyanosis or clubbing  Neuro: alert, non focal  Skin: Warm, no lesions or rashes     Assessment & Plan:  Obstructive sleep apnea Try to restart his CPAP. I suspect that if he can have better compliance then his atrial fibrillation will be easier to manage, his cardiac status and volume status would be easier to manage.  Chronic diastolic heart failure He states that his weight has been stable, no increase in edema. Using Lasix when necessary  Weakness Large  differential diagnosis here. It has been associated with some exertional dyspnea. His obstructive lung disease is mild from poor function testing, he did not respond a bronchodilator. I doubt that this is a progression of obstructive lung disease. He relates the symptoms to the initiation of palpitations. I suspect that he is out of normal sinus rhythm at least intermittently.   Check EKG today Check CBC, BMP, TSH, random cortisol today. He has follow-up arranged with cardiology on 06/27/17.   Baltazar Apo, MD, PhD 06/13/2017, 2:42 PM [facility] [phone] or if no answer [phone]

## 2017-06-13 NOTE — Assessment & Plan Note (Signed)
He states that his weight has been stable, no increase in edema. Using Lasix when necessary

## 2017-06-13 NOTE — Assessment & Plan Note (Signed)
Large differential diagnosis here. It has been associated with some exertional dyspnea. His obstructive lung disease is mild from poor function testing, he did not respond a bronchodilator. I doubt that this is a progression of obstructive lung disease. He relates the symptoms to the initiation of palpitations. I suspect that he is out of normal sinus rhythm at least intermittently.   Check EKG today Check CBC, BMP, TSH, random cortisol today. He has follow-up arranged with cardiology on 06/27/17.

## 2017-06-13 NOTE — Assessment & Plan Note (Signed)
Try to restart his CPAP. I suspect that if he can have better compliance then his atrial fibrillation will be easier to manage, his cardiac status and volume status would be easier to manage.

## 2017-06-26 NOTE — Progress Notes (Signed)
Cardiology Office Note  Date: 06/27/2017   ID: [name], DOB [birth year], MRN [id number]  PCP: Shirline Frees, MD ` Primary Cardiologist: Rozann Lesches, MD   Chief Complaint  Patient presents with  . Shortness of Breath  . Coronary Artery Disease    History of Present Illness: [name] is a 76 y.o. male that I met in March, a former patient of Dr. Aundra Dubin. He is referred back to the office by Dr. Kenton Kingfisher with concerns about shortness of breath and palpitations. He tells me that over the last month in particular he has been more short of breath with routine activities, feels an occasional heart skipping at times but no rapid palpitations or syncope. He does not specifically endorse angina or nitroglycerin use, but feels a generalized discomfort in his chest when he is short of breath.  He underwent ischemic testing approximately 6 months ago that was overall intermediate risk as described below. Echocardiogram in April showed LVEF 55-60% with only mild diastolic dysfunction.  He has a history of multivessel CAD status post CABG in 2007. Cardiac catheterization in 2011 did show some element of graft disease specifically involving the sequential SVG to OM and ramus intermedius. All other grafts were patent.  He reports compliance with his medications. He states that his blood pressure has been running somewhat lower than usual. He has had no syncope. We discussed reducing his lisinopril 2.5 mg daily, potentially discontinuing it if blood pressure remains low normal.  Today we discussed possibility of proceeding to a diagnostic cardiac catheterization to reevaluate coronary anatomy and hemodynamics in light of his progressive symptoms. He is in agreement to proceed.  Past Medical History:  Diagnosis Date  . BPH (benign prostatic hyperplasia)   . CAD (coronary artery disease)    s/p CABG 2007; NSTEMI in setting of AFib with RVR 12/2009; cath 1/11: S-RCA ok with prox 30-40%  and 50% stenoses; S-OM and RI  with patent OM limb but an occluded RI limb; S-D2 and L-LAD ok; EF 55%  . Colon cancer ([place]) 1992  . Diverticulosis   . Essential hypertension   . GERD (gastroesophageal reflux disease)   . History of pulmonary embolus (PE) 2007   Following CABG  . Hyperlipidemia   . 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 OSA on CPAP   . Paroxysmal atrial fibrillation (HCC)    Previously on Multaq, declines anticoagulation  . Personal history of colonic polyps 01/22/2000   Tubular adenoma  . RBBB (right bundle branch block)     Past Surgical History:  Procedure Laterality Date  . COLECTOMY  1992   w/ colostomy  related to colon cancer  . CORONARY ARTERY BYPASS GRAFT  2007  . HAND LIGAMENT RECONSTRUCTION  1963 1964   tendon repair and nerve; left  . INGUINAL HERNIA REPAIR  2009  . LAPAROSCOPIC CHOLECYSTECTOMY  2004  . VENTRAL HERNIA REPAIR  2009    Current Outpatient Prescriptions  Medication Sig Dispense Refill  . aspirin 325 MG EC tablet Take 325 mg by mouth daily.      . fish oil-omega-3 fatty acids 1000 MG capsule Take 1,200 g by mouth daily.     . furosemide (LASIX) 40 MG tablet TAKE ONE TABLET BY MOUTH ONCE DAILY AS NEEDED FOR FLUID OR EDEMA 90 tablet 2  . gabapentin (NEURONTIN) 100 MG capsule Take 100 mg by mouth 2 (two) times daily.    . Garlic 601 MG TABS Take 1,000  mg by mouth daily.     Marland Kitchen HYDROcodone-acetaminophen (NORCO/VICODIN) 5-325 MG tablet Take 1 tablet by mouth every 6 (six) hours as needed for moderate pain.    . isosorbide mononitrate (IMDUR) 30 MG 24 hr tablet Take 30 mg by mouth daily.    . metoprolol succinate (TOPROL-XL) 25 MG 24 hr tablet TAKE ONE TABLET BY MOUTH ONCE DAILY **TAKE EXTRA AS NEEDED FOR PALPITATIONS   PT WANTED 90 DAY SUPPLY 90 tablet 3  . Multiple Vitamin (MULTIVITAMIN) tablet Take 1 tablet by mouth daily.      . naproxen sodium (ANAPROX) 220 MG tablet Take 220 mg by mouth 2 (two) times daily with a meal.    .  nitroGLYCERIN (NITROSTAT) 0.4 MG SL tablet Place 1 tablet (0.4 mg total) under the tongue every 5 (five) minutes as needed for chest pain. 25 tablet 3  . omeprazole (PRILOSEC) 20 MG capsule Take 1 capsule by mouth daily.    . potassium chloride (K-DUR) 10 MEQ tablet Take 10 meq daily WHEN you take lasix 90 tablet 3  . rosuvastatin (CRESTOR) 5 MG tablet TAKE ONE TABLET BY MOUTH ONCE DAILY 90 tablet 2  . tamsulosin (FLOMAX) 0.4 MG CAPS capsule Take 1 capsule (0.4 mg total) by mouth daily. 90 capsule 3  . lisinopril (PRINIVIL,ZESTRIL) 2.5 MG tablet Take 1 tablet (2.5 mg total) by mouth daily. 90 tablet 3   No current facility-administered medications for this visit.    Allergies:  Patient has no known allergies.   Social History: The patient  reports that he quit smoking about 21 years ago. His smoking use included Cigarettes. He has a 45.00 pack-year smoking history. He has never used smokeless tobacco. He reports that he does not drink alcohol or use drugs.   Family History: The patient's family history includes Breast cancer in his sister and sister; Colon cancer in his sister; Emphysema in his father; Heart disease in his father and mother; Lung cancer in his mother.   ROS:  Please see the history of present illness. Otherwise, complete review of systems is positive for NYHA class 2-3 dyspnea.  All other systems are reviewed and negative.   Physical Exam: VS:  BP 94/62   Pulse 91   Ht _0  (1.803 m)   Wt 236 lb (107 kg)   SpO2 97%   BMI 32.92 kg/m , BMI Body mass index is 32.92 kg/m.  Wt Readings from Last 3 Encounters:  06/27/17 236 lb (107 kg)  06/13/17 240 lb (108.9 kg)  04/11/17 230 lb (104.3 kg)    General: Overweight male, appears comfortable at rest. HEENT: Conjunctiva and lids normal, oropharynx clear. Neck: Supple, no elevated JVP or carotid bruits, no thyromegaly. Lungs: Clear to auscultation, nonlabored breathing at rest. Cardiac: Regular rate and rhythm, no S3, soft  systolic murmur, no pericardial rub. Abdomen: Soft, nontender, bowel sounds present, no guarding or rebound. Extremities: No pitting edema, distal pulses 2+. Skin: Warm and dry. Musculoskeletal: No kyphosis. Neuropsychiatric: Alert and oriented x3, affect grossly appropriate.  ECG: I personally reviewed the tracing from 06/13/2017 which showed sinus rhythm with RBBB and PVCs.  Recent Labwork: 06/13/2017: BUN 13; Creatinine, Ser 1.21; Hemoglobin 14.2; Platelets 183.0; Potassium 4.2; Sodium 140; TSH 1.63     Component Value Date/Time   CHOL 134 09/08/2015 1210   TRIG 211.0 (H) 09/08/2015 1210   HDL 36.10 (L) 09/08/2015 1210   CHOLHDL 4 09/08/2015 1210   VLDL 42.2 (H) 09/08/2015 1210   LDLCALC 59  07/23/2014 1638   LDLDIRECT 87.0 09/08/2015 1210    Other Studies Reviewed Today:  Echocardiogram 03/19/2017: Study Conclusions  - Left ventricle: The cavity size was normal. Wall thickness was   increased in a pattern of severe LVH. Systolic function was   normal. The estimated ejection fraction was in the range of 55%   to 60%. Wall motion was normal; there were no regional wall   motion abnormalities. Doppler parameters are consistent with   abnormal left ventricular relaxation (grade 1 diastolic   dysfunction). - Right ventricle: The cavity size was mildly dilated. Wall   thickness was normal. - Atrial septum: No defect or patent foramen ovale was identified.  Lexiscan Myoview 12/03/2016:  There was no ST segment deviation noted during stress.  Defect 1: There is a medium defect of moderate severity present in the basal inferior, mid inferoseptal, mid inferior, mid inferolateral, apical inferior and apical lateral location.  Findings consistent with prior myocardial infarction with a mild degree of peri-infarct ischemia.  This is an intermediate risk study.  Nuclear stress EF: 52%.  Assessment and Plan:  1. Progressive shortness of breath as discussed above. Recent visit  with Pulmonary noted. Symptoms felt to be out of for portion to degree of pulmonary disease. He does have multivessel CAD status post CABG, intermediate risk Myoview from 6 months ago, and symptoms have worsened within the last month. Plan is to proceed with a left and right heart catheterization to evaluate coronary and bypass graft anatomy as well as hemodynamics. We discussed the risks and benefits, and he is in agreement to proceed. He states that he would prefer to have this done from the femoral approach as opposed to the radial approach.  2. Essential hypertension, recent blood pressure is low normal range. Decrease lisinopril to 2.5 mg daily, discontinue altogether if blood pressure remains low.  3. History of paroxysmal atrial fibrillation in the past, previously on Multaq, declines anticoagulation. He has had no obvious recent arrhythmias. If coronary anatomy is stable, we will likely consider further monitoring to ensure that he is not having increasing arrhythmia burden as an explanation for his shortness of breath.  4. Hyperlipidemia, currently on Crestor. He has been following lipids with PCP.  Current medicines were reviewed with the patient today.   Orders Placed This Encounter  Procedures  . DG Chest 2 View  . CBC w/Diff/Platelet  . Basic Metabolic Panel (BMET)  . INR/PT    Disposition: Follow-up after cardiac catheterization.  Signed, Satira Sark, MD, [facility] 06/27/2017 4:46 PM    [facility] [address], [place], [place] [address] Phone: [phone]; Fax: [phone]

## 2017-06-27 ENCOUNTER — Ambulatory Visit (INDEPENDENT_AMBULATORY_CARE_PROVIDER_SITE_OTHER): Payer: Medicare Other | Admitting: Cardiology

## 2017-06-27 ENCOUNTER — Encounter: Payer: Self-pay | Admitting: Cardiology

## 2017-06-27 ENCOUNTER — Ambulatory Visit (HOSPITAL_COMMUNITY)
Admission: RE | Admit: 2017-06-27 | Discharge: 2017-06-27 | Disposition: A | Discharge status: Home / Self Care | Payer: Medicare Other | Source: Ambulatory Visit | Attending: Cardiology | Admitting: Cardiology

## 2017-06-27 ENCOUNTER — Other Ambulatory Visit: Payer: Self-pay | Admitting: Cardiology

## 2017-06-27 VITALS — BP 94/62 | HR 91 | Ht 71.0 in | Wt 236.0 lb

## 2017-06-27 DIAGNOSIS — R0602 Shortness of breath: Secondary | ICD-10-CM

## 2017-06-27 DIAGNOSIS — J8410 Pulmonary fibrosis, unspecified: Secondary | ICD-10-CM | POA: Diagnosis not present

## 2017-06-27 DIAGNOSIS — Z01818 Encounter for other preprocedural examination: Secondary | ICD-10-CM | POA: Insufficient documentation

## 2017-06-27 DIAGNOSIS — E782 Mixed hyperlipidemia: Secondary | ICD-10-CM

## 2017-06-27 DIAGNOSIS — R002 Palpitations: Secondary | ICD-10-CM

## 2017-06-27 DIAGNOSIS — R0609 Other forms of dyspnea: Secondary | ICD-10-CM | POA: Insufficient documentation

## 2017-06-27 DIAGNOSIS — I10 Essential (primary) hypertension: Secondary | ICD-10-CM | POA: Diagnosis not present

## 2017-06-27 DIAGNOSIS — Z8679 Personal history of other diseases of the circulatory system: Secondary | ICD-10-CM

## 2017-06-27 DIAGNOSIS — I700 Atherosclerosis of aorta: Secondary | ICD-10-CM | POA: Insufficient documentation

## 2017-06-27 DIAGNOSIS — I25119 Atherosclerotic heart disease of native coronary artery with unspecified angina pectoris: Secondary | ICD-10-CM | POA: Diagnosis not present

## 2017-06-27 MED ORDER — LISINOPRIL 2.5 MG PO TABS: 2.5000 mg | ORAL_TABLET | Freq: Every day | ORAL | 3 refills | Status: DC

## 2017-06-27 NOTE — Patient Instructions (Addendum)
Your physician recommends that you schedule a follow-up appointment in: 1 month Dr [facility] CARDIOVASCULAR DIVISION [facility] [place] [address] Dept: [phone] Loc: [place]  06/27/2017  You are scheduled for a Cardiac Catheterization on Wednesday, July 18 with Dr. Glenetta Hew.  1. Please arrive at the [facility] (Main Entrance A) at [facility]: [address] [place], [place] [address] at 6:30 AM (two hours before your procedure to ensure your preparation). Free valet parking service is available.   Special note: Every effort is made to have your procedure done on time. Please understand that emergencies sometimes delay scheduled procedures.  2. Diet: Do not eat or drink anything after midnight prior to your procedure except sips of water to take medications.  3. Labs: You will need to have blood drawn on Friday, July 13 at [facility], get chest x-ray NOW  4. Medication instructions in preparation for your procedure   On the morning of your procedure, take your Aspirin and any morning medicines NOT listed above.  You may use sips of water.  5. Plan for one night stay--bring personal belongings. 6. Bring a current list of your medications and current insurance cards. 7. You MUST have a responsible person to drive you home. 8. Someone MUST be with you the first 24 hours after you arrive home or your discharge will be delayed. 9. Please wear clothes that are easy to get on and off and wear slip-on shoes.  Thank you for allowing [place] to care for you!   -- [facility]

## 2017-06-28 ENCOUNTER — Other Ambulatory Visit (HOSPITAL_COMMUNITY)
Admission: RE | Admit: 2017-06-28 | Discharge: 2017-06-28 | Disposition: A | Discharge status: Home / Self Care | Payer: Medicare Other | Source: Ambulatory Visit | Attending: Cardiology | Admitting: Cardiology

## 2017-06-28 DIAGNOSIS — R0609 Other forms of dyspnea: Secondary | ICD-10-CM | POA: Insufficient documentation

## 2017-06-28 DIAGNOSIS — Z01818 Encounter for other preprocedural examination: Secondary | ICD-10-CM | POA: Diagnosis present

## 2017-06-28 LAB — CBC WITH DIFFERENTIAL/PLATELET
BASOS ABS: 0 10*3/uL (ref 0.0–0.1)
Basophils Relative: 0 %
EOS PCT: 2 %
Eosinophils Absolute: 0.1 10*3/uL (ref 0.0–0.7)
HCT: 44.4 % (ref 39.0–52.0)
Hemoglobin: 14.9 g/dL (ref 13.0–17.0)
LYMPHS PCT: 26 %
Lymphs Abs: 1.7 10*3/uL (ref 0.7–4.0)
MCH: 28.5 pg (ref 26.0–34.0)
MCHC: 33.6 g/dL (ref 30.0–36.0)
MCV: 84.9 fL (ref 78.0–100.0)
MONO ABS: 0.6 10*3/uL (ref 0.1–1.0)
MONOS PCT: 9 %
Neutro Abs: 4.1 10*3/uL (ref 1.7–7.7)
Neutrophils Relative %: 63 %
PLATELETS: 157 10*3/uL (ref 150–400)
RBC: 5.23 MIL/uL (ref 4.22–5.81)
RDW: 15.4 % (ref 11.5–15.5)
WBC: 6.5 10*3/uL (ref 4.0–10.5)

## 2017-06-28 LAB — PROTIME-INR
INR: 1.07
Prothrombin Time: 13.9 seconds (ref 11.4–15.2)

## 2017-06-28 LAB — BASIC METABOLIC PANEL
ANION GAP: 8 (ref 5–15)
BUN: 15 mg/dL (ref 6–20)
CALCIUM: 9 mg/dL (ref 8.9–10.3)
CO2: 24 mmol/L (ref 22–32)
CREATININE: 1.09 mg/dL (ref 0.61–1.24)
Chloride: 104 mmol/L (ref 101–111)
GFR calc Af Amer: >60 mL/min (ref >60)
GLUCOSE: 100 mg/dL — AB (ref 65–99)
Potassium: 3.9 mmol/L (ref 3.5–5.1)
Sodium: 136 mmol/L (ref 135–145)

## 2017-07-02 ENCOUNTER — Telehealth: Payer: Self-pay

## 2017-07-02 NOTE — Telephone Encounter (Signed)
Patient contacted pre-catheterization at [facility] scheduled for: 07/03/2017 @ 0830 Verified arrival time and place:  [facility] at 0630 Confirmed AM meds to be taken pre-cath with sip of water:  Notified to take ASA prior to arrival.  Notified to hold lasix. Confirmed patient has responsible person to drive home post procedure and observe patient for 24 hours:  wife Addl concerns:  None noted.

## 2017-07-03 ENCOUNTER — Encounter (HOSPITAL_COMMUNITY)
Admission: RE | Disposition: A | Discharge status: Home / Self Care | Payer: Self-pay | Source: Ambulatory Visit | Attending: Cardiology

## 2017-07-03 ENCOUNTER — Ambulatory Visit (HOSPITAL_COMMUNITY)
Admission: RE | Admit: 2017-07-03 | Discharge: 2017-07-03 | Disposition: A | Discharge status: Home / Self Care | Payer: Medicare Other | Source: Ambulatory Visit | Attending: Cardiology | Admitting: Cardiology

## 2017-07-03 DIAGNOSIS — Z951 Presence of aortocoronary bypass graft: Secondary | ICD-10-CM

## 2017-07-03 DIAGNOSIS — I252 Old myocardial infarction: Secondary | ICD-10-CM | POA: Diagnosis not present

## 2017-07-03 DIAGNOSIS — R9439 Abnormal result of other cardiovascular function study: Secondary | ICD-10-CM | POA: Diagnosis present

## 2017-07-03 DIAGNOSIS — Z87891 Personal history of nicotine dependence: Secondary | ICD-10-CM | POA: Diagnosis not present

## 2017-07-03 DIAGNOSIS — I4510 Unspecified right bundle-branch block: Secondary | ICD-10-CM | POA: Insufficient documentation

## 2017-07-03 DIAGNOSIS — I25119 Atherosclerotic heart disease of native coronary artery with unspecified angina pectoris: Secondary | ICD-10-CM

## 2017-07-03 DIAGNOSIS — I25810 Atherosclerosis of coronary artery bypass graft(s) without angina pectoris: Secondary | ICD-10-CM | POA: Diagnosis not present

## 2017-07-03 DIAGNOSIS — N400 Enlarged prostate without lower urinary tract symptoms: Secondary | ICD-10-CM | POA: Diagnosis not present

## 2017-07-03 DIAGNOSIS — G4733 Obstructive sleep apnea (adult) (pediatric): Secondary | ICD-10-CM | POA: Diagnosis not present

## 2017-07-03 DIAGNOSIS — R0609 Other forms of dyspnea: Secondary | ICD-10-CM

## 2017-07-03 DIAGNOSIS — E785 Hyperlipidemia, unspecified: Secondary | ICD-10-CM | POA: Diagnosis not present

## 2017-07-03 DIAGNOSIS — J8410 Pulmonary fibrosis, unspecified: Secondary | ICD-10-CM | POA: Insufficient documentation

## 2017-07-03 DIAGNOSIS — Z86711 Personal history of pulmonary embolism: Secondary | ICD-10-CM | POA: Diagnosis not present

## 2017-07-03 DIAGNOSIS — Z7982 Long term (current) use of aspirin: Secondary | ICD-10-CM | POA: Diagnosis not present

## 2017-07-03 DIAGNOSIS — I2582 Chronic total occlusion of coronary artery: Secondary | ICD-10-CM | POA: Diagnosis not present

## 2017-07-03 DIAGNOSIS — K219 Gastro-esophageal reflux disease without esophagitis: Secondary | ICD-10-CM | POA: Insufficient documentation

## 2017-07-03 DIAGNOSIS — I10 Essential (primary) hypertension: Secondary | ICD-10-CM | POA: Diagnosis not present

## 2017-07-03 DIAGNOSIS — I2584 Coronary atherosclerosis due to calcified coronary lesion: Secondary | ICD-10-CM | POA: Insufficient documentation

## 2017-07-03 HISTORY — PX: RIGHT/LEFT HEART CATH AND CORONARY/GRAFT ANGIOGRAPHY: CATH118267

## 2017-07-03 LAB — POCT I-STAT 3, ART BLOOD GAS (G3+)
ACID-BASE DEFICIT: 1 mmol/L (ref 0.0–2.0)
Acid-base deficit: 2 mmol/L (ref 0.0–2.0)
Bicarbonate: 25.1 mmol/L (ref 20.0–28.0)
Bicarbonate: 25.1 mmol/L (ref 20.0–28.0)
O2 SAT: 72 %
O2 Saturation: 95 %
PCO2 ART: 49.5 mmHg — AB (ref 32.0–48.0)
PH ART: 7.312 — AB (ref 7.350–7.450)
PO2 ART: 40 mmHg — AB (ref 83.0–108.0)
TCO2: 26 mmol/L (ref 0–100)
TCO2: 27 mmol/L (ref 0–100)
pCO2 arterial: 45.9 mmHg (ref 32.0–48.0)
pH, Arterial: 7.345 — ABNORMAL LOW (ref 7.350–7.450)
pO2, Arterial: 83 mmHg (ref 83.0–108.0)

## 2017-07-03 SURGERY — RIGHT/LEFT HEART CATH AND CORONARY/GRAFT ANGIOGRAPHY
Anesthesia: LOCAL

## 2017-07-03 MED ORDER — SODIUM CHLORIDE 0.9% FLUSH: 3.0000 mL | INTRAVENOUS | Status: DC | PRN

## 2017-07-03 MED ORDER — LIDOCAINE HCL (PF) 1 % IJ SOLN
INTRAMUSCULAR | Status: DC | PRN
  Administered 2017-07-03: 17 mL
  Administered 2017-07-03: 2 mL
  Administered 2017-07-03: 20 mL

## 2017-07-03 MED ORDER — FENTANYL CITRATE (PF) 100 MCG/2ML IJ SOLN
INTRAMUSCULAR | Status: DC | PRN
  Administered 2017-07-03 (×2): 50 ug via INTRAVENOUS
  Administered 2017-07-03 (×2): 25 ug via INTRAVENOUS
  Administered 2017-07-03: 50 ug via INTRAVENOUS

## 2017-07-03 MED ORDER — ONDANSETRON HCL 4 MG/2ML IJ SOLN: 4.0000 mg | Freq: Four times a day (QID) | INTRAMUSCULAR | Status: DC | PRN

## 2017-07-03 MED ORDER — IOPAMIDOL (ISOVUE-370) INJECTION 76%
INTRAVENOUS | Status: AC
  Filled 2017-07-03: qty 100

## 2017-07-03 MED ORDER — LIDOCAINE HCL (PF) 1 % IJ SOLN
INTRAMUSCULAR | Status: AC
  Filled 2017-07-03: qty 30

## 2017-07-03 MED ORDER — SODIUM CHLORIDE 0.9% FLUSH: 3.0000 mL | Freq: Two times a day (BID) | INTRAVENOUS | Status: DC

## 2017-07-03 MED ORDER — FENTANYL CITRATE (PF) 100 MCG/2ML IJ SOLN
INTRAMUSCULAR | Status: AC
  Filled 2017-07-03: qty 2

## 2017-07-03 MED ORDER — MIDAZOLAM HCL 2 MG/2ML IJ SOLN
INTRAMUSCULAR | Status: AC
  Filled 2017-07-03: qty 2

## 2017-07-03 MED ORDER — IOPAMIDOL (ISOVUE-370) INJECTION 76%
INTRAVENOUS | Status: AC
  Filled 2017-07-03: qty 125

## 2017-07-03 MED ORDER — MIDAZOLAM HCL 2 MG/2ML IJ SOLN
INTRAMUSCULAR | Status: DC | PRN
  Administered 2017-07-03: 1 mg via INTRAVENOUS
  Administered 2017-07-03: 2 mg via INTRAVENOUS

## 2017-07-03 MED ORDER — MORPHINE SULFATE (PF) 4 MG/ML IV SOLN: 2.0000 mg | INTRAVENOUS | Status: DC | PRN

## 2017-07-03 MED ORDER — HEPARIN (PORCINE) IN NACL 2-0.9 UNIT/ML-% IJ SOLN
INTRAMUSCULAR | Status: AC
  Filled 2017-07-03: qty 500

## 2017-07-03 MED ORDER — SODIUM CHLORIDE 0.9 % WEIGHT BASED INFUSION: 1.0000 mL/kg/h | INTRAVENOUS | Status: AC

## 2017-07-03 MED ORDER — SODIUM CHLORIDE 0.9 % IV SOLN: 250.0000 mL | INTRAVENOUS | Status: DC | PRN

## 2017-07-03 MED ORDER — IOPAMIDOL (ISOVUE-370) INJECTION 76%
INTRAVENOUS | Status: DC | PRN
  Administered 2017-07-03: 180 mL via INTRA_ARTERIAL

## 2017-07-03 MED ORDER — HEPARIN (PORCINE) IN NACL 2-0.9 UNIT/ML-% IJ SOLN
INTRAMUSCULAR | Status: AC | PRN
  Administered 2017-07-03: 1000 mL

## 2017-07-03 MED ORDER — ACETAMINOPHEN 325 MG PO TABS: 650.0000 mg | ORAL_TABLET | ORAL | Status: DC | PRN

## 2017-07-03 MED ORDER — SODIUM CHLORIDE 0.9 % IV SOLN
INTRAVENOUS | Status: DC
  Administered 2017-07-03: 09:00:00 via INTRAVENOUS

## 2017-07-03 MED ORDER — ASPIRIN 81 MG PO CHEW: 81.0000 mg | CHEWABLE_TABLET | ORAL | Status: DC

## 2017-07-03 SURGICAL SUPPLY — 20 items
CATH BALLN WEDGE 5F 110CM (CATHETERS) IMPLANT
CATH INFINITI 5 FR IM (CATHETERS) ×1 IMPLANT
CATH INFINITI 5 FR MPA2 (CATHETERS) ×1 IMPLANT
CATH INFINITI 5FR MULTPACK ANG (CATHETERS) ×1 IMPLANT
CATH SWAN GANZ 7F STRAIGHT (CATHETERS) ×1 IMPLANT
COVER PRB 48X5XTLSCP FOLD TPE (BAG) IMPLANT
COVER PROBE 5X48 (BAG) ×4
GUIDEWIRE .025 260CM (WIRE) ×2 IMPLANT
GUIDEWIRE 3MM J TIP .035 145 (WIRE) ×1 IMPLANT
KIT HEART LEFT (KITS) ×2 IMPLANT
KIT MICROINTRODUCER STIFF 5F (SHEATH) ×1 IMPLANT
PACK CARDIAC CATHETERIZATION (CUSTOM PROCEDURE TRAY) ×2 IMPLANT
SHEATH GLIDE SLENDER 4/5FR (SHEATH) ×1 IMPLANT
SHEATH PINNACLE 5F 10CM (SHEATH) ×1 IMPLANT
SHEATH PINNACLE 7F 10CM (SHEATH) ×1 IMPLANT
SYR MEDRAD MARK V 150ML (SYRINGE) ×2 IMPLANT
TRANSDUCER W/STOPCOCK (MISCELLANEOUS) ×2 IMPLANT
WIRE EMERALD 3MM-J .025X260CM (WIRE) ×1 IMPLANT
WIRE EMERALD 3MM-J .035X260CM (WIRE) ×1 IMPLANT
WIRE HI TORQ VERSACORE-J 145CM (WIRE) ×1 IMPLANT

## 2017-07-03 NOTE — Progress Notes (Signed)
Site area: Left groin a 7 french venous sheath was removed  Site Prior to Removal:  Level 0  Pressure Applied For 15 MINUTES    Bedrest Beginning at 1145a  Manual:   Yes.    Patient Status During Pull:  stable  Post Pull Groin Site:  Level 0  Post Pull Instructions Given:  Yes.    Post Pull Pulses Present:  Yes.    Dressing Applied:  Yes.    Comments:  VS remain stable Pt resting with eyes closed  BP 111/74, HR 68, R 15  O2 Sat 94% RA  No complaints of pain at this time. Pain meds effective from procedure.

## 2017-07-03 NOTE — Progress Notes (Addendum)
Site area: Right groin a 5 french arterial sheath was removed  Site Prior to Removal:  Level 0  Pressure Applied For 20 MINUTES    Bedrest Beginning at 1145p  Manual:   Yes.    Patient Status During Pull:  stable  Post Pull Groin Site:  Level 0  Post Pull Instructions Given:  Yes.    Post Pull Pulses Present:  Yes.    Dressing Applied:  Yes.    Comments:  VS remain stable

## 2017-07-03 NOTE — H&P (View-Only) (Signed)
Cardiology Office Note  Date: 06/27/2017   ID: [name], DOB [birth year], MRN [id number]  PCP: Shirline Frees, MD ` Primary Cardiologist: Rozann Lesches, MD   Chief Complaint  Patient presents with  . Shortness of Breath  . Coronary Artery Disease    History of Present Illness: [name] is a 76 y.o. male that I met in March, a former patient of Dr. Aundra Dubin. He is referred back to the office by Dr. Kenton Kingfisher with concerns about shortness of breath and palpitations. He tells me that over the last month in particular he has been more short of breath with routine activities, feels an occasional heart skipping at times but no rapid palpitations or syncope. He does not specifically endorse angina or nitroglycerin use, but feels a generalized discomfort in his chest when he is short of breath.  He underwent ischemic testing approximately 6 months ago that was overall intermediate risk as described below. Echocardiogram in April showed LVEF 55-60% with only mild diastolic dysfunction.  He has a history of multivessel CAD status post CABG in 2007. Cardiac catheterization in 2011 did show some element of graft disease specifically involving the sequential SVG to OM and ramus intermedius. All other grafts were patent.  He reports compliance with his medications. He states that his blood pressure has been running somewhat lower than usual. He has had no syncope. We discussed reducing his lisinopril 2.5 mg daily, potentially discontinuing it if blood pressure remains low normal.  Today we discussed possibility of proceeding to a diagnostic cardiac catheterization to reevaluate coronary anatomy and hemodynamics in light of his progressive symptoms. He is in agreement to proceed.  Past Medical History:  Diagnosis Date  . BPH (benign prostatic hyperplasia)   . CAD (coronary artery disease)    s/p CABG 2007; NSTEMI in setting of AFib with RVR 12/2009; cath 1/11: S-RCA ok with prox 30-40%  and 50% stenoses; S-OM and RI  with patent OM limb but an occluded RI limb; S-D2 and L-LAD ok; EF 55%  . Colon cancer ([place]) 1992  . Diverticulosis   . Essential hypertension   . GERD (gastroesophageal reflux disease)   . History of pulmonary embolus (PE) 2007   Following CABG  . Hyperlipidemia   . 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 OSA on CPAP   . Paroxysmal atrial fibrillation (HCC)    Previously on Multaq, declines anticoagulation  . Personal history of colonic polyps 01/22/2000   Tubular adenoma  . RBBB (right bundle branch block)     Past Surgical History:  Procedure Laterality Date  . COLECTOMY  1992   w/ colostomy  related to colon cancer  . CORONARY ARTERY BYPASS GRAFT  2007  . HAND LIGAMENT RECONSTRUCTION  1963 1964   tendon repair and nerve; left  . INGUINAL HERNIA REPAIR  2009  . LAPAROSCOPIC CHOLECYSTECTOMY  2004  . VENTRAL HERNIA REPAIR  2009    Current Outpatient Prescriptions  Medication Sig Dispense Refill  . aspirin 325 MG EC tablet Take 325 mg by mouth daily.      . fish oil-omega-3 fatty acids 1000 MG capsule Take 1,200 g by mouth daily.     . furosemide (LASIX) 40 MG tablet TAKE ONE TABLET BY MOUTH ONCE DAILY AS NEEDED FOR FLUID OR EDEMA 90 tablet 2  . gabapentin (NEURONTIN) 100 MG capsule Take 100 mg by mouth 2 (two) times daily.    . Garlic 601 MG TABS Take 1,000  mg by mouth daily.     Marland Kitchen HYDROcodone-acetaminophen (NORCO/VICODIN) 5-325 MG tablet Take 1 tablet by mouth every 6 (six) hours as needed for moderate pain.    . isosorbide mononitrate (IMDUR) 30 MG 24 hr tablet Take 30 mg by mouth daily.    . metoprolol succinate (TOPROL-XL) 25 MG 24 hr tablet TAKE ONE TABLET BY MOUTH ONCE DAILY **TAKE EXTRA AS NEEDED FOR PALPITATIONS   PT WANTED 90 DAY SUPPLY 90 tablet 3  . Multiple Vitamin (MULTIVITAMIN) tablet Take 1 tablet by mouth daily.      . naproxen sodium (ANAPROX) 220 MG tablet Take 220 mg by mouth 2 (two) times daily with a meal.    .  nitroGLYCERIN (NITROSTAT) 0.4 MG SL tablet Place 1 tablet (0.4 mg total) under the tongue every 5 (five) minutes as needed for chest pain. 25 tablet 3  . omeprazole (PRILOSEC) 20 MG capsule Take 1 capsule by mouth daily.    . potassium chloride (K-DUR) 10 MEQ tablet Take 10 meq daily WHEN you take lasix 90 tablet 3  . rosuvastatin (CRESTOR) 5 MG tablet TAKE ONE TABLET BY MOUTH ONCE DAILY 90 tablet 2  . tamsulosin (FLOMAX) 0.4 MG CAPS capsule Take 1 capsule (0.4 mg total) by mouth daily. 90 capsule 3  . lisinopril (PRINIVIL,ZESTRIL) 2.5 MG tablet Take 1 tablet (2.5 mg total) by mouth daily. 90 tablet 3   No current facility-administered medications for this visit.    Allergies:  Patient has no known allergies.   Social History: The patient  reports that he quit smoking about 21 years ago. His smoking use included Cigarettes. He has a 45.00 pack-year smoking history. He has never used smokeless tobacco. He reports that he does not drink alcohol or use drugs.   Family History: The patient's family history includes Breast cancer in his sister and sister; Colon cancer in his sister; Emphysema in his father; Heart disease in his father and mother; Lung cancer in his mother.   ROS:  Please see the history of present illness. Otherwise, complete review of systems is positive for NYHA class 2-3 dyspnea.  All other systems are reviewed and negative.   Physical Exam: VS:  BP 94/62   Pulse 91   Ht _0  (1.803 m)   Wt 236 lb (107 kg)   SpO2 97%   BMI 32.92 kg/m , BMI Body mass index is 32.92 kg/m.  Wt Readings from Last 3 Encounters:  06/27/17 236 lb (107 kg)  06/13/17 240 lb (108.9 kg)  04/11/17 230 lb (104.3 kg)    General: Overweight male, appears comfortable at rest. HEENT: Conjunctiva and lids normal, oropharynx clear. Neck: Supple, no elevated JVP or carotid bruits, no thyromegaly. Lungs: Clear to auscultation, nonlabored breathing at rest. Cardiac: Regular rate and rhythm, no S3, soft  systolic murmur, no pericardial rub. Abdomen: Soft, nontender, bowel sounds present, no guarding or rebound. Extremities: No pitting edema, distal pulses 2+. Skin: Warm and dry. Musculoskeletal: No kyphosis. Neuropsychiatric: Alert and oriented x3, affect grossly appropriate.  ECG: I personally reviewed the tracing from 06/13/2017 which showed sinus rhythm with RBBB and PVCs.  Recent Labwork: 06/13/2017: BUN 13; Creatinine, Ser 1.21; Hemoglobin 14.2; Platelets 183.0; Potassium 4.2; Sodium 140; TSH 1.63     Component Value Date/Time   CHOL 134 09/08/2015 1210   TRIG 211.0 (H) 09/08/2015 1210   HDL 36.10 (L) 09/08/2015 1210   CHOLHDL 4 09/08/2015 1210   VLDL 42.2 (H) 09/08/2015 1210   LDLCALC 59  07/23/2014 1638   LDLDIRECT 87.0 09/08/2015 1210    Other Studies Reviewed Today:  Echocardiogram 03/19/2017: Study Conclusions  - Left ventricle: The cavity size was normal. Wall thickness was   increased in a pattern of severe LVH. Systolic function was   normal. The estimated ejection fraction was in the range of 55%   to 60%. Wall motion was normal; there were no regional wall   motion abnormalities. Doppler parameters are consistent with   abnormal left ventricular relaxation (grade 1 diastolic   dysfunction). - Right ventricle: The cavity size was mildly dilated. Wall   thickness was normal. - Atrial septum: No defect or patent foramen ovale was identified.  Lexiscan Myoview 12/03/2016:  There was no ST segment deviation noted during stress.  Defect 1: There is a medium defect of moderate severity present in the basal inferior, mid inferoseptal, mid inferior, mid inferolateral, apical inferior and apical lateral location.  Findings consistent with prior myocardial infarction with a mild degree of peri-infarct ischemia.  This is an intermediate risk study.  Nuclear stress EF: 52%.  Assessment and Plan:  1. Progressive shortness of breath as discussed above. Recent visit  with Pulmonary noted. Symptoms felt to be out of for portion to degree of pulmonary disease. He does have multivessel CAD status post CABG, intermediate risk Myoview from 6 months ago, and symptoms have worsened within the last month. Plan is to proceed with a left and right heart catheterization to evaluate coronary and bypass graft anatomy as well as hemodynamics. We discussed the risks and benefits, and he is in agreement to proceed. He states that he would prefer to have this done from the femoral approach as opposed to the radial approach.  2. Essential hypertension, recent blood pressure is low normal range. Decrease lisinopril to 2.5 mg daily, discontinue altogether if blood pressure remains low.  3. History of paroxysmal atrial fibrillation in the past, previously on Multaq, declines anticoagulation. He has had no obvious recent arrhythmias. If coronary anatomy is stable, we will likely consider further monitoring to ensure that he is not having increasing arrhythmia burden as an explanation for his shortness of breath.  4. Hyperlipidemia, currently on Crestor. He has been following lipids with PCP.  Current medicines were reviewed with the patient today.   Orders Placed This Encounter  Procedures  . DG Chest 2 View  . CBC w/Diff/Platelet  . Basic Metabolic Panel (BMET)  . INR/PT    Disposition: Follow-up after cardiac catheterization.  Signed, Satira Sark, MD, [facility] 06/27/2017 4:46 PM    [facility] [address], [place], [place] [address] Phone: [phone]; Fax: [phone]

## 2017-07-03 NOTE — Discharge Instructions (Signed)

## 2017-07-03 NOTE — Progress Notes (Signed)
Pt difficult IV insertion, myself and another RN attempted. IV team consult placed. Rennis Harding also called and informed of reason for delay.

## 2017-07-03 NOTE — Interval H&P Note (Signed)
History and Physical Interval Note:  07/03/2017 8:48 AM  [name]  has presented today for surgery, with the diagnosis of cad - angina/DOE with abnormal nuclear ST.  The various methods of treatment have been discussed with the patient and family. After consideration of risks, benefits and other options for treatment, the patient has consented to  Procedure(s): Right/Left Heart Cath and Coronary/Graft Angiography (N/A) as a surgical intervention .  The patient's history has been reviewed, patient examined, no change in status, stable for surgery.  I have reviewed the patient's chart and labs.  Questions were answered to the patient's satisfaction.    Cath Lab Visit (complete for each Cath Lab visit)  Clinical Evaluation Leading to the Procedure:   ACS: No.  Non-ACS:    Anginal Classification: CCS III  Anti-ischemic medical therapy: Maximal Therapy (2 or more classes of medications)  Non-Invasive Test Results: Intermediate-risk stress test findings: cardiac mortality 1-3%/year  Prior CABG: Previous CABG   [name]

## 2017-07-04 ENCOUNTER — Encounter (HOSPITAL_COMMUNITY): Payer: Self-pay | Admitting: Cardiology

## 2017-07-11 ENCOUNTER — Other Ambulatory Visit: Payer: Self-pay | Admitting: Physician Assistant

## 2017-07-30 NOTE — Progress Notes (Deleted)
Cardiology Office Note  Date: 07/30/2017   ID: UGO THOMA, DOB Aug 08, 1941, MRN [id number]  PCP: Shirline Frees, MD  Primary Cardiologist: Rozann Lesches, MD   No chief complaint on file.   History of Present Illness: [name] is a 76 y.o. male last seen in July and referred at that time with progressive shortness of breath for cardiac catheterization to reassess coronary and bypass graft anatomy. Procedure was performed and Dr. Ellyn Hack, results are outlined below. Overall coronary and bypass graft anatomy were described as being relatively stable with the exception of some progression in the ostial first diagonal and also distal LAD. PCI was not pursued at that point although consideration for [facility] evaluation mentioned if medical therapy was not effective. He also had low normal right heart pressures.  Past Medical History:  Diagnosis Date  . BPH (benign prostatic hyperplasia)   . CAD (coronary artery disease)    s/p CABG 2007; NSTEMI in setting of AFib with RVR 12/2009; cath 1/11: S-RCA ok with prox 30-40% and 50% stenoses; S-OM and RI  with patent OM limb but an occluded RI limb; S-D2 and L-LAD ok; EF 55%  . Colon cancer ([place]) 1992  . Diverticulosis   . Essential hypertension   . GERD (gastroesophageal reflux disease)   . History of pulmonary embolus (PE) 2007   Following CABG  . Hyperlipidemia   . 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 OSA on CPAP   . Paroxysmal atrial fibrillation (HCC)    Previously on Multaq, declines anticoagulation  . Personal history of colonic polyps 01/22/2000   Tubular adenoma  . RBBB (right bundle branch block)     Past Surgical History:  Procedure Laterality Date  . COLECTOMY  1992   w/ colostomy  related to colon cancer  . CORONARY ARTERY BYPASS GRAFT  2007  . HAND LIGAMENT RECONSTRUCTION  1963 1964   tendon repair and nerve; left  . INGUINAL HERNIA REPAIR  2009  . LAPAROSCOPIC CHOLECYSTECTOMY  2004  . RIGHT/LEFT HEART CATH  AND CORONARY/GRAFT ANGIOGRAPHY N/A 07/03/2017   Procedure: Right/Left Heart Cath and Coronary/Graft Angiography;  Surgeon: Leonie Man, MD;  Location: [facility];  Service: Cardiovascular;  Laterality: N/A;  . VENTRAL HERNIA REPAIR  2009    Current Outpatient Prescriptions  Medication Sig Dispense Refill  . aspirin 325 MG EC tablet Take 325 mg by mouth daily.      . furosemide (LASIX) 40 MG tablet TAKE ONE TABLET BY MOUTH ONCE DAILY AS NEEDED FOR FLUID OR EDEMA (Patient taking differently: Take 40 mg by mouth daily as needed for fluid or edema. ) 90 tablet 2  . gabapentin (NEURONTIN) 100 MG capsule Take 100 mg by mouth 2 (two) times daily.    . Garlic 9735 MG CAPS Take 1,000 mg by mouth daily.    Marland Kitchen HYDROcodone-acetaminophen (NORCO/VICODIN) 5-325 MG tablet Take 1 tablet by mouth every 6 (six) hours as needed for moderate pain.    . isosorbide mononitrate (IMDUR) 30 MG 24 hr tablet Take 30 mg by mouth daily.    Marland Kitchen lisinopril (PRINIVIL,ZESTRIL) 2.5 MG tablet Take 1 tablet (2.5 mg total) by mouth daily. 90 tablet 3  . metoprolol succinate (TOPROL-XL) 25 MG 24 hr tablet TAKE ONE TABLET BY MOUTH ONCE DAILY **TAKE EXTRA AS NEEDED FOR PALPITATIONS   PT WANTED 90 DAY SUPPLY (Patient taking differently: Take 25 mg by mouth daily. ) 90 tablet 3  . Multiple Vitamin (  MULTIVITAMIN WITH MINERALS) TABS tablet Take 1 tablet by mouth daily.    . naproxen sodium (ANAPROX) 220 MG tablet Take 220 mg by mouth 2 (two) times daily as needed (FOR PAIN.).     Marland Kitchen nitroGLYCERIN (NITROSTAT) 0.4 MG SL tablet Place 1 tablet (0.4 mg total) under the tongue every 5 (five) minutes as needed for chest pain. 25 tablet 3  . Omega-3 Fatty Acids (FISH OIL) 1200 MG CAPS Take 1,200 mg by mouth daily.    Marland Kitchen omeprazole (PRILOSEC) 20 MG capsule Take 20 mg by mouth every other day. IN THE MORNING    . potassium chloride (K-DUR) 10 MEQ tablet Take 10 meq daily WHEN you take lasix (Patient taking differently: Take 10 mEq by mouth  daily as needed (for fluid/edema--whenever you take lasix). ) 90 tablet 3  . rosuvastatin (CRESTOR) 5 MG tablet TAKE ONE TABLET BY MOUTH ONCE DAILY (Patient taking differently: TAKE ONE TABLET BY MOUTH ONCE DAILY AT BEDTIME.) 90 tablet 2  . tamsulosin (FLOMAX) 0.4 MG CAPS capsule Take 1 capsule (0.4 mg total) by mouth daily. (Patient taking differently: Take 0.8 mg by mouth at bedtime. ) 90 capsule 3  . tiZANidine (ZANAFLEX) 4 MG tablet Take 4 mg by mouth at bedtime as needed for muscle spasms.     No current facility-administered medications for this visit.    Allergies:  Patient has no known allergies.   Social History: The patient  reports that he quit smoking about 21 years ago. His smoking use included Cigarettes. He has a 45.00 pack-year smoking history. He has never used smokeless tobacco. He reports that he does not drink alcohol or use drugs.   Family History: The patient's family history includes Breast cancer in his sister and sister; Colon cancer in his sister; Emphysema in his father; Heart disease in his father and mother; Lung cancer in his mother.   ROS:  Please see the history of present illness. Otherwise, complete review of systems is positive for {NONE DEFAULTED:18576::"none"}.  All other systems are reviewed and negative.   Physical Exam: VS:  There were no vitals taken for this visit., BMI There is no height or weight on file to calculate BMI.  Wt Readings from Last 3 Encounters:  07/03/17 230 lb (104.3 kg)  06/27/17 236 lb (107 kg)  06/13/17 240 lb (108.9 kg)    General: Overweight male,appears comfortable at rest. HEENT: Conjunctiva and lids normal, oropharynx clear. Neck: Supple, no elevated JVP or carotid bruits, no thyromegaly. Lungs: Clear to auscultation, nonlabored breathing at rest. Cardiac: Regular rate and rhythm, no S3, softsystolic murmur, no pericardial rub. Abdomen: Soft, nontender, bowel sounds present, no guarding or rebound. Extremities: No  pitting edema, distal pulses 2+. Skin: Warm and dry. Musculoskeletal: No kyphosis. Neuropsychiatric: Alert and oriented x3, affect grossly appropriate.  ECG: I personally reviewed the tracing from 06/13/2017 which showed sinus rhythm with RBBB and PVCs.  Recent Labwork: 06/13/2017: TSH 1.63 06/28/2017: BUN 15; Creatinine, Ser 1.09; Hemoglobin 14.9; Platelets 157; Potassium 3.9; Sodium 136     Component Value Date/Time   CHOL 134 09/08/2015 1210   TRIG 211.0 (H) 09/08/2015 1210   HDL 36.10 (L) 09/08/2015 1210   CHOLHDL 4 09/08/2015 1210   VLDL 42.2 (H) 09/08/2015 1210   LDLCALC 59 07/23/2014 1638   LDLDIRECT 87.0 09/08/2015 1210    Other Studies Reviewed Today:  Echocardiogram 03/19/2017: Study Conclusions  - Left ventricle: The cavity size was normal. Wall thickness was increased in a pattern of  severe LVH. Systolic function was normal. The estimated ejection fraction was in the range of 55% to 60%. Wall motion was normal; there were no regional wall motion abnormalities. Doppler parameters are consistent with abnormal left ventricular relaxation (grade 1 diastolic dysfunction). - Right ventricle: The cavity size was mildly dilated. Wall thickness was normal. - Atrial septum: No defect or patent foramen ovale was identified.  Cardiac catheterization 07/03/2017:  Prox RCA lesion, 40 %stenosed. Mid RCA lesion, 60 %stenosed (focal). Dist RCA lesion, 30 %stenosed. - No obstructive disease noted.  Ramus lesion, 99 %stenosed. - Essentially the native vessel is subtotally occluded despite being grafted  Prox Cx lesion, 85 %stenosed. 1st Mrg lesion, 100 %stenosed - grafted  Ost 1st Diag lesion, 70 %stenosed. Smooth, likely not acute lesion  Prox LAD lesion, 75 %stenosed. Ost 2nd Diag to 2nd Diag lesion, 100 %stenosed.  Dist LAD lesion, 75 %stenosed relatively focal lesion downstream from LIMA insertion -  LIMA graft was visualized by non-selective angiography and  is normal in caliber and anatomically normal.  SVG-RI-OM graft was visualized by angiography and is large. Patent with minimal luminal irregularities.  SVG-dRCA graft was visualized by angiography and is large. Origin to Prox Graft lesion, 40 %stenosed. Insertion lesion, 40 %stenosed.  SVG-Diad2 graft was visualized by angiography and is moderate in size. Angiographically normal  The left ventricular systolic function is normal.  LV end diastolic pressure is normal.  Normal right heart cath pressures - borderline dehydrated   No significant change to angiography based on report from 2011. It does appear to be ostial first diagonal branches progressed some from before, but this does not appear to be an acute lesion. There is also the distal LAD lesion beyond the graft which is not very amenable to PCI.  Cannot be certain that either the diagonal lesion or the distal LAD lesion are causing his symptoms however. The stress test did not suggest ischemia in this area, in fact there is more related to the inferior wall. Given the difficulty of the procedure and the time intensity associated with it, I felt it more prudent to discontinue the procedure today with plans for medical management and consider the possibility of PCI of the first diagonal branch with a stent from the LAD into the diagonal if FFR positive.  Assessment and Plan:    Current medicines were reviewed with the patient today.  No orders of the defined types were placed in this encounter.   Disposition:  Signed, Satira Sark, MD, [facility] 07/30/2017 3:51 PM    [facility], [place], [place] [address] Phone: [phone]; Fax: [phone]

## 2017-07-31 ENCOUNTER — Ambulatory Visit: Payer: Medicare Other | Admitting: Cardiology

## 2017-08-01 ENCOUNTER — Ambulatory Visit: Payer: Medicare Other | Admitting: Cardiology

## 2017-08-05 NOTE — Progress Notes (Signed)
Cardiology Office Note  Date: 08/06/2017   ID: [name], DOB April 03, 1941, MRN [id number]  PCP: Shirline Frees, MD  Primary Cardiologist: Rozann Lesches, MD   Chief Complaint  Patient presents with  . Coronary Artery Disease    History of Present Illness: [name] is a 76 y.o. male seen recently in July. At that time he was referred for a cardiac catheterization due to progressive shortness of breath. Procedure was performed by Dr. Ellyn Hack. Report indicates moderate multivessel CAD with patent LIMA to LAD with 75% stenosis in the LAD distal to graft insertion, patent SVG to OM and ramus, patent SVG to RCA with 40% graft stenosis, patent SVG to second diagonal, low to low normal right heart pressures. Medical therapy was felt to be best treatment option.  He presents today for follow-up. Reports no angina but still has intermittent episodes of shortness of breath, also palpitations. We discussed his medications and have decided to stop lisinopril and increase Toprol XL to 50 mg daily.  We went over the results of his cardiac catheterization and plan to continue with medical therapy adjustments as tolerated. Also discussed possibility of a cardiac monitor if palpitations persist despite increase in beta blocker.  Past Medical History:  Diagnosis Date  . BPH (benign prostatic hyperplasia)   . CAD (coronary artery disease)    s/p CABG 2007; NSTEMI in setting of AFib with RVR 12/2009; cath 1/11: S-RCA ok with prox 30-40% and 50% stenoses; S-OM and RI  with patent OM limb but an occluded RI limb; S-D2 and L-LAD ok; EF 55%  . Colon cancer ([place]) 1992  . Diverticulosis   . Essential hypertension   . GERD (gastroesophageal reflux disease)   . History of pulmonary embolus (PE) 2007   Following CABG  . Hyperlipidemia   . 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 OSA on CPAP   . Paroxysmal atrial fibrillation (HCC)    Previously on Multaq, declines anticoagulation  . Personal  history of colonic polyps 01/22/2000   Tubular adenoma  . RBBB (right bundle branch block)     Past Surgical History:  Procedure Laterality Date  . COLECTOMY  1992   w/ colostomy  related to colon cancer  . CORONARY ARTERY BYPASS GRAFT  2007  . HAND LIGAMENT RECONSTRUCTION  1963 1964   tendon repair and nerve; left  . INGUINAL HERNIA REPAIR  2009  . LAPAROSCOPIC CHOLECYSTECTOMY  2004  . RIGHT/LEFT HEART CATH AND CORONARY/GRAFT ANGIOGRAPHY N/A 07/03/2017   Procedure: Right/Left Heart Cath and Coronary/Graft Angiography;  Surgeon: Leonie Man, MD;  Location: [facility];  Service: Cardiovascular;  Laterality: N/A;  . VENTRAL HERNIA REPAIR  2009    Current Outpatient Prescriptions  Medication Sig Dispense Refill  . aspirin 325 MG EC tablet Take 325 mg by mouth daily.      . furosemide (LASIX) 40 MG tablet TAKE ONE TABLET BY MOUTH ONCE DAILY AS NEEDED FOR FLUID OR EDEMA (Patient taking differently: Take 40 mg by mouth daily as needed for fluid or edema. ) 90 tablet 2  . gabapentin (NEURONTIN) 100 MG capsule Take 100 mg by mouth 2 (two) times daily.    . Garlic 0175 MG CAPS Take 1,000 mg by mouth daily.    Marland Kitchen HYDROcodone-acetaminophen (NORCO/VICODIN) 5-325 MG tablet Take 1 tablet by mouth every 6 (six) hours as needed for moderate pain.    . isosorbide mononitrate (IMDUR) 30 MG 24 hr tablet Take  30 mg by mouth daily.    . Multiple Vitamin (MULTIVITAMIN WITH MINERALS) TABS tablet Take 1 tablet by mouth daily.    . naproxen sodium (ANAPROX) 220 MG tablet Take 220 mg by mouth 2 (two) times daily as needed (FOR PAIN.).     Marland Kitchen nitroGLYCERIN (NITROSTAT) 0.4 MG SL tablet Place 1 tablet (0.4 mg total) under the tongue every 5 (five) minutes as needed for chest pain. 25 tablet 3  . Omega-3 Fatty Acids (FISH OIL) 1200 MG CAPS Take 1,200 mg by mouth daily.    Marland Kitchen omeprazole (PRILOSEC) 20 MG capsule Take 20 mg by mouth every other day. IN THE MORNING    . potassium chloride (K-DUR) 10 MEQ  tablet Take 10 meq daily WHEN you take lasix (Patient taking differently: Take 10 mEq by mouth daily as needed (for fluid/edema--whenever you take lasix). ) 90 tablet 3  . rosuvastatin (CRESTOR) 5 MG tablet TAKE ONE TABLET BY MOUTH ONCE DAILY (Patient taking differently: TAKE ONE TABLET BY MOUTH ONCE DAILY AT BEDTIME.) 90 tablet 2  . tamsulosin (FLOMAX) 0.4 MG CAPS capsule Take 1 capsule (0.4 mg total) by mouth daily. (Patient taking differently: Take 0.8 mg by mouth at bedtime. ) 90 capsule 3  . tiZANidine (ZANAFLEX) 4 MG tablet Take 4 mg by mouth at bedtime as needed for muscle spasms.    . metoprolol succinate (TOPROL-XL) 50 MG 24 hr tablet Take 1 tablet (50 mg total) by mouth daily. Take with or immediately following a meal. 30 tablet 1   No current facility-administered medications for this visit.    Allergies:  Patient has no known allergies.   Social History: The patient  reports that he quit smoking about 21 years ago. His smoking use included Cigarettes. He has a 45.00 pack-year smoking history. He has never used smokeless tobacco. He reports that he does not drink alcohol or use drugs.   ROS:  Please see the history of present illness. Otherwise, complete review of systems is positive for none.  All other systems are reviewed and negative.   Physical Exam: VS:  BP 98/64   Pulse 96   Ht 5\' 11"  (1.803 m)   Wt 237 lb 9.6 oz (107.8 kg)   SpO2 93%   BMI 33.14 kg/m , BMI Body mass index is 33.14 kg/m.  Wt Readings from Last 3 Encounters:  08/06/17 237 lb 9.6 oz (107.8 kg)  07/03/17 230 lb (104.3 kg)  06/27/17 236 lb (107 kg)    General: Overweight male,appears comfortable at rest. HEENT: Conjunctiva and lids normal, oropharynx clear. Neck: Supple, no elevated JVP or carotid bruits, no thyromegaly. Lungs: Clear to auscultation, nonlabored breathing at rest. Cardiac: Regular rate and rhythm, no S3, softsystolic murmur, no pericardial rub. Abdomen: Soft, nontender, bowel sounds  present, no guarding or rebound. Extremities: No pitting edema, distal pulses 2+. Skin: Warm and dry. Musculoskeletal: No kyphosis. Neuropsychiatric: Alert and oriented x3, affect grossly appropriate.  ECG: I personally reviewed the tracing from 06/13/2017 which showed sinus rhythm with RBBB and PVCs.  Recent Labwork: 06/13/2017: TSH 1.63 06/28/2017: BUN 15; Creatinine, Ser 1.09; Hemoglobin 14.9; Platelets 157; Potassium 3.9; Sodium 136     Component Value Date/Time   CHOL 134 09/08/2015 1210   TRIG 211.0 (H) 09/08/2015 1210   HDL 36.10 (L) 09/08/2015 1210   CHOLHDL 4 09/08/2015 1210   VLDL 42.2 (H) 09/08/2015 1210   LDLCALC 59 07/23/2014 1638   LDLDIRECT 87.0 09/08/2015 1210    Other Studies Reviewed  Today:  Cardiac catheterization 07/03/2017:  Prox RCA lesion, 40 %stenosed. Mid RCA lesion, 60 %stenosed (focal). Dist RCA lesion, 30 %stenosed. - No obstructive disease noted.  Ramus lesion, 99 %stenosed. - Essentially the native vessel is subtotally occluded despite being grafted  Prox Cx lesion, 85 %stenosed. 1st Mrg lesion, 100 %stenosed - grafted  Ost 1st Diag lesion, 70 %stenosed. Smooth, likely not acute lesion  Prox LAD lesion, 75 %stenosed. Ost 2nd Diag to 2nd Diag lesion, 100 %stenosed.  Dist LAD lesion, 75 %stenosed relatively focal lesion downstream from LIMA insertion -  LIMA graft was visualized by non-selective angiography and is normal in caliber and anatomically normal.  SVG-RI-OM graft was visualized by angiography and is large. Patent with minimal luminal irregularities.  SVG-dRCA graft was visualized by angiography and is large. Origin to Prox Graft lesion, 40 %stenosed. Insertion lesion, 40 %stenosed.  SVG-Diad2 graft was visualized by angiography and is moderate in size. Angiographically normal  The left ventricular systolic function is normal.  LV end diastolic pressure is normal.  Normal right heart cath pressures - borderline dehydrated   No  significant change to angiography based on report from 2011. It does appear to be ostial first diagonal branches progressed some from before, but this does not appear to be an acute lesion. There is also the distal LAD lesion beyond the graft which is not very amenable to PCI.  Cannot be certain that either the diagonal lesion or the distal LAD lesion are causing his symptoms however. The stress test did not suggest ischemia in this area, in fact there is more related to the inferior wall. Given the difficulty of the procedure and the time intensity associated with it, I felt it more prudent to discontinue the procedure today with plans for medical management and consider the possibility of PCI of the first diagonal branch with a stent from the LAD into the diagonal if FFR positive.  Echocardiogram 03/19/2017: Study Conclusions  - Left ventricle: The cavity size was normal. Wall thickness was   increased in a pattern of severe LVH. Systolic function was   normal. The estimated ejection fraction was in the range of 55%   to 60%. Wall motion was normal; there were no regional wall   motion abnormalities. Doppler parameters are consistent with   abnormal left ventricular relaxation (grade 1 diastolic   dysfunction). - Right ventricle: The cavity size was mildly dilated. Wall   thickness was normal. - Atrial septum: No defect or patent foramen ovale was identified.  Assessment and Plan:  1. CAD status post CABG. Recent cardiac catheterization results are outlined above. This point I would agree that medical therapy is most reasonable option. We will continue regimen including Imdur, increase in beta blocker with concurrent palpitations, and stop lisinopril given low blood pressure.  2. Intermittent episodes of shortness of breath and palpitations. He does have a history of PAF but none documented recently and he declines anticoagulation. We will increase Toprol-XL to 50 mg daily, if palpitations  continue a cardiac monitor will be obtained.  3. Essential hypertension, blood pressure low normal range. Stopping lisinopril completely.  4. Hyperlipidemia on Crestor.  Current medicines were reviewed with the patient today.   Disposition: Follow-up in 6 weeks.  Signed, Satira Sark, MD, [facility] 08/06/2017 3:26 PM    [facility] [place], [place], [place] [address] Phone: [phone]; Fax: [phone]

## 2017-08-06 ENCOUNTER — Ambulatory Visit (INDEPENDENT_AMBULATORY_CARE_PROVIDER_SITE_OTHER): Payer: Medicare Other | Admitting: Cardiology

## 2017-08-06 ENCOUNTER — Encounter: Payer: Self-pay | Admitting: Cardiology

## 2017-08-06 VITALS — BP 98/64 | HR 96 | Ht 71.0 in | Wt 237.6 lb

## 2017-08-06 DIAGNOSIS — I10 Essential (primary) hypertension: Secondary | ICD-10-CM | POA: Diagnosis not present

## 2017-08-06 DIAGNOSIS — Z8679 Personal history of other diseases of the circulatory system: Secondary | ICD-10-CM

## 2017-08-06 DIAGNOSIS — R002 Palpitations: Secondary | ICD-10-CM

## 2017-08-06 DIAGNOSIS — E782 Mixed hyperlipidemia: Secondary | ICD-10-CM

## 2017-08-06 DIAGNOSIS — I25119 Atherosclerotic heart disease of native coronary artery with unspecified angina pectoris: Secondary | ICD-10-CM | POA: Diagnosis not present

## 2017-08-06 MED ORDER — METOPROLOL SUCCINATE ER 50 MG PO TB24: 50.0000 mg | ORAL_TABLET | Freq: Every day | ORAL | 1 refills | Status: DC

## 2017-08-06 NOTE — Patient Instructions (Signed)
Medication Instructions:  Your physician has recommended you make the following change in your medication:   STOP lisinopril  STOP metoprolol 25 mg  BEGIN metoprolol 50 mg daily  Please continue all other medications as prescribed  Labwork: none  Testing/Procedures: none  Follow-Up: Your physician recommends that you schedule a follow-up appointment in: [place]  Any Other Special Instructions Will Be Listed Below (If Applicable).  If you need a refill on your cardiac medications before your next appointment, please call your pharmacy.

## 2017-08-08 ENCOUNTER — Ambulatory Visit: Payer: Medicare Other | Admitting: Cardiology

## 2017-09-18 NOTE — Progress Notes (Signed)
Cardiology Office Note  Date: 09/20/2017   ID: [name], DOB 10-Jan-1941, MRN [id number]  PCP: Shirline Frees, MD  Primary Cardiologist: Rozann Lesches, MD   Chief Complaint  Patient presents with  . Coronary Artery Disease    History of Present Illness: [name] is a 76 y.o. male last seen in August. He presents today with his wife for a follow-up visit. Still reports dyspnea on exertion, no progressive angina symptoms. He is not very active reporting limitations related to hip arthritis and neuropathy. We discussed the results of his cardiac catheterization from August and plan to continue medical therapy at this time.  Current cardiac regimen includes aspirin, Lasix, Imdur, lisinopril, Toprol-XL, potassium supplements, Crestor, and as needed nitroglycerin.  He does not report any palpitations and his most recent ECG showed sinus rhythm.  Past Medical History:  Diagnosis Date  . BPH (benign prostatic hyperplasia)   . CAD (coronary artery disease)    s/p CABG 2007; NSTEMI in setting of AFib with RVR 12/2009; cath 1/11: S-RCA ok with prox 30-40% and 50% stenoses; S-OM and RI  with patent OM limb but an occluded RI limb; S-D2 and L-LAD ok; EF 55%  . Colon cancer ([place]) 1992  . Diverticulosis   . Essential hypertension   . GERD (gastroesophageal reflux disease)   . History of pulmonary embolus (PE) 2007   Following CABG  . Hyperlipidemia   . 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 OSA on CPAP   . Paroxysmal atrial fibrillation (HCC)    Previously on Multaq, declines anticoagulation  . Personal history of colonic polyps 01/22/2000   Tubular adenoma  . RBBB (right bundle branch block)     Past Surgical History:  Procedure Laterality Date  . COLECTOMY  1992   w/ colostomy  related to colon cancer  . CORONARY ARTERY BYPASS GRAFT  2007  . HAND LIGAMENT RECONSTRUCTION  1963 1964   tendon repair and nerve; left  . INGUINAL HERNIA REPAIR  2009  . LAPAROSCOPIC  CHOLECYSTECTOMY  2004  . RIGHT/LEFT HEART CATH AND CORONARY/GRAFT ANGIOGRAPHY N/A 07/03/2017   Procedure: Right/Left Heart Cath and Coronary/Graft Angiography;  Surgeon: Leonie Man, MD;  Location: [facility];  Service: Cardiovascular;  Laterality: N/A;  . VENTRAL HERNIA REPAIR  2009    Current Outpatient Prescriptions  Medication Sig Dispense Refill  . aspirin 325 MG EC tablet Take 325 mg by mouth daily.      . furosemide (LASIX) 40 MG tablet TAKE ONE TABLET BY MOUTH ONCE DAILY AS NEEDED FOR FLUID OR EDEMA (Patient taking differently: Take 40 mg by mouth daily as needed for fluid or edema. ) 90 tablet 2  . gabapentin (NEURONTIN) 100 MG capsule Take 100 mg by mouth 2 (two) times daily.    . Garlic 8850 MG CAPS Take 1,000 mg by mouth daily.    Marland Kitchen HYDROcodone-acetaminophen (NORCO/VICODIN) 5-325 MG tablet Take 1 tablet by mouth every 6 (six) hours as needed for moderate pain.    . isosorbide mononitrate (IMDUR) 30 MG 24 hr tablet Take 30 mg by mouth daily.    Marland Kitchen lisinopril (PRINIVIL,ZESTRIL) 2.5 MG tablet Take 2.5 mg by mouth daily.    . metoprolol succinate (TOPROL-XL) 50 MG 24 hr tablet Take 1 tablet (50 mg total) by mouth daily. Take with or immediately following a meal. 30 tablet 1  . Multiple Vitamin (MULTIVITAMIN WITH MINERALS) TABS tablet Take 1 tablet by mouth daily.    Marland Kitchen  naproxen sodium (ANAPROX) 220 MG tablet Take 220 mg by mouth 2 (two) times daily as needed (FOR PAIN.).     Marland Kitchen nitroGLYCERIN (NITROSTAT) 0.4 MG SL tablet Place 1 tablet (0.4 mg total) under the tongue every 5 (five) minutes as needed for chest pain. 25 tablet 3  . Omega-3 Fatty Acids (FISH OIL) 1200 MG CAPS Take 1,200 mg by mouth daily.    Marland Kitchen omeprazole (PRILOSEC) 20 MG capsule Take 20 mg by mouth every other day. IN THE MORNING    . potassium chloride (K-DUR) 10 MEQ tablet Take 10 meq daily WHEN you take lasix (Patient taking differently: Take 10 mEq by mouth daily as needed (for fluid/edema--whenever you take  lasix). ) 90 tablet 3  . rosuvastatin (CRESTOR) 5 MG tablet TAKE ONE TABLET BY MOUTH ONCE DAILY (Patient taking differently: TAKE ONE TABLET BY MOUTH ONCE DAILY AT BEDTIME.) 90 tablet 2  . tamsulosin (FLOMAX) 0.4 MG CAPS capsule Take 0.4 mg by mouth 2 (two) times daily.    Marland Kitchen tiZANidine (ZANAFLEX) 4 MG tablet Take 4 mg by mouth at bedtime as needed for muscle spasms.     No current facility-administered medications for this visit.    Allergies:  Patient has no known allergies.   Social History: The patient  reports that he quit smoking about 21 years ago. His smoking use included Cigarettes. He has a 45.00 pack-year smoking history. He has never used smokeless tobacco. He reports that he does not drink alcohol or use drugs.   ROS:  Please see the history of present illness. Otherwise, complete review of systems is positive for chronic neuropathy and hip pain.  All other systems are reviewed and negative.   Physical Exam: VS:  BP 120/62   Pulse 69   Ht 5\' 11"  (1.803 m)   Wt 241 lb (109.3 kg)   SpO2 96%   BMI 33.61 kg/m , BMI Body mass index is 33.61 kg/m.  Wt Readings from Last 3 Encounters:  09/20/17 241 lb (109.3 kg)  08/06/17 237 lb 9.6 oz (107.8 kg)  07/03/17 230 lb (104.3 kg)    General: Patient appears comfortable at rest. HEENT: Conjunctiva and lids normal, oropharynx clear. Neck: Supple, no elevated JVP or carotid bruits, no thyromegaly. Lungs: Clear to auscultation, nonlabored breathing at rest. Cardiac: Regular rate and rhythm, no S3, soft systolic murmur, no pericardial rub. Abdomen: Soft, nontender, bowel sounds present, no guarding or rebound. Extremities: No pitting edema, distal pulses 2+. Skin: Warm and dry. Musculoskeletal: No kyphosis. Neuropsychiatric: Alert and oriented x3, affect grossly appropriate.  ECG: I personally reviewed the tracing from 06/13/2017 which showed sinus rhythm with PVCs and right bundle branch block.  Recent Labwork: 06/13/2017: TSH  1.63 06/28/2017: BUN 15; Creatinine, Ser 1.09; Hemoglobin 14.9; Platelets 157; Potassium 3.9; Sodium 136     Component Value Date/Time   CHOL 134 09/08/2015 1210   TRIG 211.0 (H) 09/08/2015 1210   HDL 36.10 (L) 09/08/2015 1210   CHOLHDL 4 09/08/2015 1210   VLDL 42.2 (H) 09/08/2015 1210   LDLCALC 59 07/23/2014 1638   LDLDIRECT 87.0 09/08/2015 1210    Other Studies Reviewed Today:  Cardiac catheterization 07/03/2017:  Prox RCA lesion, 40 %stenosed. Mid RCA lesion, 60 %stenosed (focal). Dist RCA lesion, 30 %stenosed. - No obstructive disease noted.  Ramus lesion, 99 %stenosed. - Essentially the native vessel is subtotally occluded despite being grafted  Prox Cx lesion, 85 %stenosed. 1st Mrg lesion, 100 %stenosed - grafted  Ost 1st Diag lesion,  70 %stenosed. Smooth, likely not acute lesion  Prox LAD lesion, 75 %stenosed. Ost 2nd Diag to 2nd Diag lesion, 100 %stenosed.  Dist LAD lesion, 75 %stenosed relatively focal lesion downstream from LIMA insertion -  LIMA graft was visualized by non-selective angiography and is normal in caliber and anatomically normal.  SVG-RI-OM graft was visualized by angiography and is large. Patent with minimal luminal irregularities.  SVG-dRCA graft was visualized by angiography and is large. Origin to Prox Graft lesion, 40 %stenosed. Insertion lesion, 40 %stenosed.  SVG-Diad2 graft was visualized by angiography and is moderate in size. Angiographically normal  The left ventricular systolic function is normal.  LV end diastolic pressure is normal.  Normal right heart cath pressures - borderline dehydrated  No significant change to angiography based on report from 2011. It does appear to be ostial first diagonal branches progressed some from before, but this does not appear to be an acute lesion. There is also the distal LAD lesion beyond the graft which is not very amenable to PCI.  Cannot be certain that either the diagonal lesion or the distal  LAD lesion are causing his symptoms however. The stress test did not suggest ischemia in this area, in fact there is more related to the inferior wall. Given the difficulty of the procedure and the time intensity associated with it, I felt it more prudent to discontinue the procedure today with plans for medical management and consider the possibility of PCI of the first diagonal branch with a stent from the LAD into the diagonal if FFR positive.  Echocardiogram 03/19/2017: Study Conclusions  - Left ventricle: The cavity size was normal. Wall thickness was increased in a pattern of severe LVH. Systolic function was normal. The estimated ejection fraction was in the range of 55% to 60%. Wall motion was normal; there were no regional wall motion abnormalities. Doppler parameters are consistent with abnormal left ventricular relaxation (grade 1 diastolic dysfunction). - Right ventricle: The cavity size was mildly dilated. Wall thickness was normal. - Atrial septum: No defect or patent foramen ovale was identified.  Assessment and Plan:  1. CAD status post CABG. Plan is to continue with medical therapy which he is tolerating well at this time. He resumed lisinopril stating that blood pressure trend had increased, and his blood pressure is normal today. I recommended a basic walking plan to try and increase his activity somewhat.  2. History of PAF, he continues on beta blocker therapy with plan for observation. He has declined anticoagulation.  3. Essential hypertension, blood pressure is normal today on current regimen.  4. Hyperlipidemia, continues on Crestor.  Current medicines were reviewed with the patient today.  Disposition: Follow-up in 6 months.  Signed, Satira Sark, MD, [facility] 09/20/2017 12:23 PM    [facility], [place], [place] [address] Phone: [phone]; Fax: [phone]

## 2017-09-20 ENCOUNTER — Ambulatory Visit (INDEPENDENT_AMBULATORY_CARE_PROVIDER_SITE_OTHER): Payer: Medicare Other | Admitting: Cardiology

## 2017-09-20 ENCOUNTER — Encounter: Payer: Self-pay | Admitting: Cardiology

## 2017-09-20 VITALS — BP 120/62 | HR 69 | Ht 71.0 in | Wt 241.0 lb

## 2017-09-20 DIAGNOSIS — Z8679 Personal history of other diseases of the circulatory system: Secondary | ICD-10-CM | POA: Diagnosis not present

## 2017-09-20 DIAGNOSIS — I10 Essential (primary) hypertension: Secondary | ICD-10-CM

## 2017-09-20 DIAGNOSIS — I25119 Atherosclerotic heart disease of native coronary artery with unspecified angina pectoris: Secondary | ICD-10-CM | POA: Diagnosis not present

## 2017-09-20 DIAGNOSIS — E782 Mixed hyperlipidemia: Secondary | ICD-10-CM | POA: Diagnosis not present

## 2017-09-20 NOTE — Patient Instructions (Signed)

## 2017-10-21 ENCOUNTER — Other Ambulatory Visit: Payer: Self-pay | Admitting: Cardiology

## 2018-03-31 ENCOUNTER — Ambulatory Visit (INDEPENDENT_AMBULATORY_CARE_PROVIDER_SITE_OTHER): Payer: Medicare Other | Admitting: Otolaryngology

## 2018-05-02 ENCOUNTER — Other Ambulatory Visit: Payer: Self-pay | Admitting: Cardiology

## 2018-06-02 ENCOUNTER — Telehealth: Payer: Self-pay | Admitting: Cardiology

## 2018-06-02 NOTE — Telephone Encounter (Signed)
Patient states he has had several weeks of increased SOB .Is still able to sleep at night with one pillow. Non productive cough, no CP, no weight gain.Does have occasional skipped heart beat but HR is not fast.He plans to follow up with pulmonologist and made f/u apt for Dr Domenic Polite on 7/11 at 17 am in the [place] office.Patient understands if sx's worsen to go to the ED, he agrees.

## 2018-06-02 NOTE — Telephone Encounter (Signed)
Per phone call from pt's wife-- having SOB, heart is skipping beats

## 2018-06-03 ENCOUNTER — Other Ambulatory Visit (INDEPENDENT_AMBULATORY_CARE_PROVIDER_SITE_OTHER): Payer: Medicare Other

## 2018-06-03 ENCOUNTER — Ambulatory Visit (INDEPENDENT_AMBULATORY_CARE_PROVIDER_SITE_OTHER): Payer: Medicare Other | Admitting: Pulmonary Disease

## 2018-06-03 ENCOUNTER — Other Ambulatory Visit: Payer: Self-pay

## 2018-06-03 ENCOUNTER — Ambulatory Visit (INDEPENDENT_AMBULATORY_CARE_PROVIDER_SITE_OTHER)
Admission: RE | Admit: 2018-06-03 | Discharge: 2018-06-03 | Disposition: A | Discharge status: Home / Self Care | Payer: Medicare Other | Source: Ambulatory Visit | Attending: Pulmonary Disease | Admitting: Pulmonary Disease

## 2018-06-03 ENCOUNTER — Encounter: Payer: Self-pay | Admitting: Pulmonary Disease

## 2018-06-03 VITALS — BP 118/70 | HR 88 | Ht 71.0 in | Wt 241.0 lb

## 2018-06-03 DIAGNOSIS — I5032 Chronic diastolic (congestive) heart failure: Secondary | ICD-10-CM

## 2018-06-03 DIAGNOSIS — R0609 Other forms of dyspnea: Secondary | ICD-10-CM

## 2018-06-03 DIAGNOSIS — I480 Paroxysmal atrial fibrillation: Secondary | ICD-10-CM

## 2018-06-03 LAB — BASIC METABOLIC PANEL
BUN: 13 mg/dL (ref 6–23)
CALCIUM: 9.3 mg/dL (ref 8.4–10.5)
CO2: 25 meq/L (ref 19–32)
Chloride: 105 mEq/L (ref 96–112)
Creatinine, Ser: 1.16 mg/dL (ref 0.40–1.50)
GFR: 64.75 mL/min (ref >60.00)
GLUCOSE: 137 mg/dL — AB (ref 70–99)
Potassium: 3.9 mEq/L (ref 3.5–5.1)
SODIUM: 138 meq/L (ref 135–145)

## 2018-06-03 LAB — CBC WITH DIFFERENTIAL/PLATELET
BASOS PCT: 0.5 % (ref 0.0–3.0)
Basophils Absolute: 0 10*3/uL (ref 0.0–0.1)
EOS PCT: 1.9 % (ref 0.0–5.0)
Eosinophils Absolute: 0.1 10*3/uL (ref 0.0–0.7)
HEMATOCRIT: 43.7 % (ref 39.0–52.0)
HEMOGLOBIN: 14.6 g/dL (ref 13.0–17.0)
LYMPHS PCT: 31.8 % (ref 12.0–46.0)
Lymphs Abs: 1.9 10*3/uL (ref 0.7–4.0)
MCHC: 33.4 g/dL (ref 30.0–36.0)
MCV: 83.9 fl (ref 78.0–100.0)
MONOS PCT: 8 % (ref 3.0–12.0)
Monocytes Absolute: 0.5 10*3/uL (ref 0.1–1.0)
NEUTROS ABS: 3.4 10*3/uL (ref 1.4–7.7)
Neutrophils Relative %: 57.8 % (ref 43.0–77.0)
PLATELETS: 180 10*3/uL (ref 150.0–400.0)
RBC: 5.21 Mil/uL (ref 4.22–5.81)
RDW: 15.8 % — AB (ref 11.5–15.5)
WBC: 6 10*3/uL (ref 4.0–10.5)

## 2018-06-03 LAB — BRAIN NATRIURETIC PEPTIDE: Pro B Natriuretic peptide (BNP): 63 pg/mL (ref 0.0–100.0)

## 2018-06-03 MED ORDER — FUROSEMIDE 40 MG PO TABS: ORAL_TABLET | ORAL | 2 refills | Status: DC

## 2018-06-03 MED ORDER — APIXABAN 5 MG PO TABS: 5.0000 mg | ORAL_TABLET | Freq: Two times a day (BID) | ORAL | 0 refills | Status: DC

## 2018-06-03 NOTE — Progress Notes (Signed)
   Subjective:    Patient ID: [name], male    DOB: November 02, 1941, 77 y.o.   MRN: [id number]  HPI   77 year old remote smoker with chronic diastolic heart failure and CAD for which he sees Dr. Domenic Polite.  He has paroxysmal atrial fibrillation and is not on anticoagulation. He has OSA but has not tolerated CPAP.  He follows up in our office with Dr. Malvin Johns for mild airflow obstruction based on spirometry but is not maintained on bronchodilators    Chief Complaint  Patient presents with  . Follow-up    SOB in the past 2 weeks. It has been getting worse. Has noticed the increase more in the mornings. Increased non-productive cough. Denies any back pain or chest pain.    He reports increasing dyspnea over the past 2 weeks with sudden onset, denies URI symptoms at onset, no pedal edema no orthopnea paroxysmal nocturnal dyspnea. He takes Lasix on an as-needed basis He has not taken this in the last 2 weeks.  His weight is stable at 240 pounds compared to his visit from 2018 He denies wheezing or sputum production, he does have a dry cough And his oxygen saturation is 92% today  EKG shows sinus rhythm with right bundle branch block and occasional PVC  On review of previous EKG, right bundle branch block is chronic  Is a remote history of pulmonary embolism in 2007 after CABG  Significant tests/ events reviewed  PFT 01/2010 FVC 3.98(88), FEV1 3.14(103%), ratio 79, TLC 7.94(117%), DLCO 84%, no BD    Past Medical History:  Diagnosis Date  . BPH (benign prostatic hyperplasia)   . CAD (coronary artery disease)    s/p CABG 2007; NSTEMI in setting of AFib with RVR 12/2009; cath 1/11: S-RCA ok with prox 30-40% and 50% stenoses; S-OM and RI  with patent OM limb but an occluded RI limb; S-D2 and L-LAD ok; EF 55%  . Colon cancer ([place]) 1992  . Diverticulosis   . Essential hypertension   . GERD (gastroesophageal reflux disease)   . History of pulmonary embolus (PE) 2007   Following CABG    . Hyperlipidemia   . 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 OSA on CPAP   . Paroxysmal atrial fibrillation (HCC)    Previously on Multaq, declines anticoagulation  . Personal history of colonic polyps 01/22/2000   Tubular adenoma  . RBBB (right bundle branch block)       Review of Systems neg for any significant sore throat, dysphagia, itching, sneezing, nasal congestion or excess/ purulent secretions, fever, chills, sweats, unintended wt loss, pleuritic or exertional cp, hempoptysis, orthopnea pnd or change in chronic leg swelling.   Also denies presyncope, palpitations, heartburn, abdominal pain, nausea, vomiting, diarrhea or change in bowel or urinary habits, dysuria,hematuria, rash, arthralgias, visual complaints, headache, numbness weakness or ataxia.     Objective:   Physical Exam  Gen. Pleasant, obese, in no distress ENT - no lesions, no post nasal drip Neck: No JVD, no thyromegaly, no carotid bruits Lungs: no use of accessory muscles, no dullness to percussion, decreased without rales or rhonchi  Cardiovascular: Rhythm regular, heart sounds  normal, no murmurs or gallops, no peripheral edema Musculoskeletal: No deformities, no cyanosis or clubbing , no tremors       Assessment & Plan:

## 2018-06-03 NOTE — Assessment & Plan Note (Signed)
Take Lasix daily even though he does not seem to have overt signs of heart failure or pedal edema.  And his weight is unchanged from a year ago

## 2018-06-03 NOTE — Patient Instructions (Signed)
Blood work and chest x-ray today Take Lasix 40 mg daily. Call us back if worse

## 2018-06-03 NOTE — Assessment & Plan Note (Signed)
Cause for his dyspnea is unclear, he does not appear obviously volume overloaded There is no wheezing on auscultation He does not have any preceding URI symptoms  We will obtain basic blood work including BNP and d-dimer given his history of remote pulmonary embolism and pursue CT angiogram if needed, if no other cause identified

## 2018-06-04 LAB — D-DIMER, QUANTITATIVE: D-Dimer, Quant: 0.43 mcg/mL FEU (ref <0.50)

## 2018-06-16 NOTE — Progress Notes (Signed)
@Patient  ID: [name], male    DOB: [birth year], 77 y.o.   MRN: [id number]  Chief Complaint  Patient presents with  . Follow-up    States he has been well, cpap is working great. Has no SD card in machine.     Referring provider: Shirline Frees, MD  HPI: 77 year old former smoker (45 pack years) with history of coronary artery disease, CABG, A. fib, obstructive sleep apnea with CPAP.  Patient of Dr. Lamonte Sakai.  Recent  Pulmonary Encounters:  06/03/18-acute-[place] Patient reporting dyspnea over the last 2 weeks with sudden onset.  EKG shows sinus rhythm with right bundle branch block and occasional PVC. Plan: D-dimer today, take Lasix as prescribed, BNP, if d-dimer positive we will do CT angios, Eliquis sample provided  06/03/2018- BNP 63 06/03/2018-d-dimer-0.43 >>> Ordered to not start Eliquis   Tests:  PFT 01/2010 FVC 3.98(88), FEV1 3.14(103%), ratio 79, TLC 7.94(117%), DLCO 84%, no BD      06/17/18  OV  Patient reports the office today reporting that he is feeling much better still has some occasional dyspnea.  Patient was confused and whether not he still needs to be taking his Eliquis that was briefly discussed with him by Dr. Elsworth Soho.  Patient and spouse report they do have an appointment to see cardiology on 06/26/2018.  Patient reporting he does have some occasional palpitations and shortness of breath.  EKG done last week showed PVCs normal sinus rhythm.  Unfortunately patient CPAP machine does not have an ST card in it.  We will work to get this replaced by DME company.  No Known Allergies  Immunization History  Administered Date(s) Administered  . Influenza Split 06/25/2012, 09/22/2013  . Influenza Whole 10/17/2009  . Influenza,inj,Quad PF,6+ Mos 11/24/2014  . Pneumococcal Polysaccharide-23 10/17/2005    Past Medical History:  Diagnosis Date  . BPH (benign prostatic hyperplasia)   . CAD (coronary artery disease)    s/p CABG 2007; NSTEMI in setting of AFib  with RVR 12/2009; cath 1/11: S-RCA ok with prox 30-40% and 50% stenoses; S-OM and RI  with patent OM limb but an occluded RI limb; S-D2 and L-LAD ok; EF 55%  . Colon cancer ([place]) 1992  . Diverticulosis   . Essential hypertension   . GERD (gastroesophageal reflux disease)   . History of pulmonary embolus (PE) 2007   Following CABG  . Hyperlipidemia   . 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 OSA on CPAP   . Paroxysmal atrial fibrillation (HCC)    Previously on Multaq, declines anticoagulation  . Personal history of colonic polyps 01/22/2000   Tubular adenoma  . RBBB (right bundle branch block)     Tobacco History: Social History   Tobacco Use  Smoking Status Former Smoker  . Packs/day: 1.00  . Years: 45.00  . Pack years: 45.00  . Types: Cigarettes  . Last attempt to quit: 12/18/1995  . Years since quitting: 22.5  Smokeless Tobacco Never Used   Counseling given: Yes Continue not smoking.  Outpatient Encounter Medications as of 06/17/2018  Medication Sig  . aspirin 325 MG EC tablet Take 325 mg by mouth daily.    Marland Kitchen atorvastatin (LIPITOR) 20 MG tablet   . furosemide (LASIX) 40 MG tablet TAKE ONE TABLET BY MOUTH ONCE DAILY AS NEEDED FOR FLUID OR EDEMA  . gabapentin (NEURONTIN) 100 MG capsule Take 100 mg by mouth 2 (two) times daily.  . Garlic 8546 MG CAPS Take 1,000 mg by mouth daily.  Marland Kitchen  HYDROcodone-acetaminophen (NORCO/VICODIN) 5-325 MG tablet Take 1 tablet by mouth every 6 (six) hours as needed for moderate pain.  . isosorbide mononitrate (IMDUR) 30 MG 24 hr tablet TAKE 1 TABLET BY MOUTH ONCE DAILY  . losartan (COZAAR) 25 MG tablet   . metoprolol succinate (TOPROL-XL) 50 MG 24 hr tablet TAKE 1 TABLET BY MOUTH ONCE DAILY. TAKE  WITH  OR  IMMEDIATELY  FOLLOWING  A  MEAL.  . Multiple Vitamin (MULTIVITAMIN WITH MINERALS) TABS tablet Take 1 tablet by mouth daily.  . naproxen sodium (ANAPROX) 220 MG tablet Take 220 mg by mouth 2 (two) times daily as needed (FOR PAIN.).   Marland Kitchen  nitroGLYCERIN (NITROSTAT) 0.4 MG SL tablet Place 1 tablet (0.4 mg total) under the tongue every 5 (five) minutes as needed for chest pain.  . Omega-3 Fatty Acids (FISH OIL) 1200 MG CAPS Take 1,200 mg by mouth daily.  Marland Kitchen omeprazole (PRILOSEC) 20 MG capsule Take 20 mg by mouth every other day. IN THE MORNING  . potassium chloride (K-DUR) 10 MEQ tablet Take 10 meq daily WHEN you take lasix (Patient taking differently: Take 10 mEq by mouth daily as needed (for fluid/edema--whenever you take lasix). )  . tamsulosin (FLOMAX) 0.4 MG CAPS capsule Take 0.4 mg by mouth 2 (two) times daily.  Marland Kitchen tiZANidine (ZANAFLEX) 4 MG tablet Take 4 mg by mouth at bedtime as needed for muscle spasms.  . [DISCONTINUED] apixaban (ELIQUIS) 5 MG TABS tablet Take 1 tablet (5 mg total) by mouth 2 (two) times daily.   No facility-administered encounter medications on file as of 06/17/2018.      Review of Systems  Constitutional: +fatigue   No  weight loss, night sweats,  fevers, chills HEENT:   No headaches,  Difficulty swallowing,  Tooth/dental problems, or  Sore throat, No sneezing, itching, ear ache, nasal congestion, post nasal drip  CV: No chest pain,  orthopnea, PND, swelling in lower extremities, anasarca, dizziness, palpitations, syncope  GI: No heartburn, indigestion, abdominal pain, nausea, vomiting, diarrhea, change in bowel habits, loss of appetite, bloody stools Resp:  +some sob with exertion   No shortness of breath at rest.  No excess mucus, no productive cough,  No non-productive cough,  No coughing up of blood.  No change in color of mucus.  No wheezing.  No chest wall deformity Skin: no rash, lesions, no skin changes. GU: no dysuria, change in color of urine, no urgency or frequency.  No flank pain, no hematuria  MS:  No joint pain or swelling.  No decreased range of motion.  No back pain. Psych:  No change in mood or affect. No depression or anxiety.  No memory loss.   Physical Exam  BP 122/80   Pulse 86    Ht 5\' 11"  (1.803 m)   Wt 245 lb (111.1 kg)   SpO2 93%   BMI 34.17 kg/m    Wt Readings from Last 3 Encounters:  06/17/18 245 lb (111.1 kg)  06/03/18 241 lb (109.3 kg)  09/20/17 241 lb (109.3 kg)    GEN: A/Ox3; pleasant , NAD, well nourished    HEENT:  [place]/AT,  EACs-clear, TMs-wnl, NOSE-clear, THROAT-clear, no lesions, no postnasal drip or exudate noted.   NECK:  Supple w/ fair ROM; no JVD; normal carotid impulses w/o bruits; no thyromegaly or nodules palpated; no lymphadenopathy.    RESP:  Clear  P & A; w/o, wheezes/ rales/ or rhonchi. no accessory muscle use, no dullness to percussion  CARD:  RRR,  occasionally irregular on palpation (probable PVCs), no m/r/g, no peripheral edema, pulses intact, no cyanosis or clubbing.  GI:   Soft & nt; nml bowel sounds; no organomegaly or masses detected.   Musco: Warm bil, no deformities or joint swelling noted.   Neuro: alert, no focal deficits noted.    Skin: Warm, no lesions or rashes    Lab Results:  CBC    Component Value Date/Time   WBC 6.0 06/03/2018 1119   RBC 5.21 06/03/2018 1119   HGB 14.6 06/03/2018 1119   HCT 43.7 06/03/2018 1119   PLT 180.0 06/03/2018 1119   MCV 83.9 06/03/2018 1119   MCH 28.5 06/28/2017 1000   MCHC 33.4 06/03/2018 1119   RDW 15.8 (H) 06/03/2018 1119   LYMPHSABS 1.9 06/03/2018 1119   MONOABS 0.5 06/03/2018 1119   EOSABS 0.1 06/03/2018 1119   BASOSABS 0.0 06/03/2018 1119    BMET    Component Value Date/Time   NA 138 06/03/2018 1119   K 3.9 06/03/2018 1119   CL 105 06/03/2018 1119   CO2 25 06/03/2018 1119   GLUCOSE 137 (H) 06/03/2018 1119   BUN 13 06/03/2018 1119   CREATININE 1.16 06/03/2018 1119   CREATININE 1.15 10/13/2012 0942   CALCIUM 9.3 06/03/2018 1119   GFRNONAA >60 06/28/2017 1000   GFRAA >60 06/28/2017 1000    BNP    Component Value Date/Time   BNP 50.6 12/07/2011 1500    ProBNP    Component Value Date/Time   PROBNP 63.0 06/03/2018 1119    Imaging: Dg Chest 2  View  Result Date: 06/03/2018 CLINICAL DATA:  Shortness of breath. EXAM: CHEST - 2 VIEW COMPARISON:  Radiographs of June 27, 2017. FINDINGS: Stable cardiomegaly. Status post coronary artery bypass graft. No pneumothorax or pleural effusion is noted. Stable calcified granuloma seen in right midlung. No acute pulmonary disease is noted. Bony thorax is unremarkable. IMPRESSION: No active cardiopulmonary disease. Electronically Signed   By: Marijo Conception, M.D.   On: 06/03/2018 12:59     Assessment & Plan:   Pleasant 77 year old patient seen office today.  I encouraged him to keep their follow-up appointment with cardiology next week.  After much discussion patient spouse admits that they are interested in potentially getting started with another cardiologist that is outside of the [facility] system.  I discussed with him one option they could try is potentially seen Dr. Adrian Prows.  This would require that they would need to drive to [facility] to be seen. They will let us know if they need a referral.   Upon chart review as well as discussion with the patient since his d-dimer was negative patient does not need to be taking Eliquis. Pt stated they understood.   Encouraged patient to use CPAP daily at least 4 hours.  Will work with DME company to get new SD card provided to patient.  Have patient follow-up with Dr. Lamonte Sakai in 3 months.  ATRIAL FIBRILLATION Rate today is slightly irregular.  I suspect patient is still having PVCs but is normal sinus rhythm.  We will not do EKG as well as just done last week.  Patient is not symptomatic at this time.  Encourage patient to keep follow-up appointment with cardiology on 06/26/2018  Obstructive sleep apnea  Follow-up with Dr. Lamonte Sakai in 3 months  Resume CPAP use and use daily >>> 4 hours of use at least every night >>> Wear with naps >>> Contact our office if you are unable to use or  you have any difficulties using your machine >>> We will work to get SD card  sent you from your DME supply company  Please contact our office if your symptoms worsen, having issues breathing, or difficulty using her CPAP    OBESITY Continue to work towards healthy weight  Dyspnea on exertion Stable today D-dimer was negative on 06/03/2018 BNP was within normal limits  Keep follow-up with cardiology  Follow-up with Dr. Lamonte Sakai in 3 months     Lauraine Rinne, NP 06/17/2018

## 2018-06-17 ENCOUNTER — Encounter: Payer: Self-pay | Admitting: Pulmonary Disease

## 2018-06-17 ENCOUNTER — Ambulatory Visit (INDEPENDENT_AMBULATORY_CARE_PROVIDER_SITE_OTHER): Payer: Medicare Other | Admitting: Pulmonary Disease

## 2018-06-17 DIAGNOSIS — Z6834 Body mass index (BMI) 34.0-34.9, adult: Secondary | ICD-10-CM

## 2018-06-17 DIAGNOSIS — G4733 Obstructive sleep apnea (adult) (pediatric): Secondary | ICD-10-CM | POA: Diagnosis not present

## 2018-06-17 DIAGNOSIS — R0609 Other forms of dyspnea: Secondary | ICD-10-CM

## 2018-06-17 DIAGNOSIS — I480 Paroxysmal atrial fibrillation: Secondary | ICD-10-CM | POA: Diagnosis not present

## 2018-06-17 DIAGNOSIS — E669 Obesity, unspecified: Secondary | ICD-10-CM

## 2018-06-17 NOTE — Patient Instructions (Signed)
Keep follow-up with cardiology in 06/26/2018  Follow-up with Dr. Lamonte Sakai in 3 months  Resume CPAP use and use daily >>> 4 hours of use at least every night >>> Wear with naps >>> Contact our office if you are unable to use or you have any difficulties using your machine >>> We will work to get SD card sent you from your DME supply company  Please contact our office if your symptoms worsen, having issues breathing, or difficulty using her CPAP    Please contact the office if your symptoms worsen or you have concerns that you are not improving.   Thank you for choosing [facility] for your healthcare, and for allowing [place] to partner with you on your healthcare journey. I am thankful to be able to provide care to you today.   Wyn Quaker FNP-C

## 2018-06-17 NOTE — Assessment & Plan Note (Signed)
  Follow-up with Dr. Lamonte Sakai in 3 months  Resume CPAP use and use daily >>> 4 hours of use at least every night >>> Wear with naps >>> Contact our office if you are unable to use or you have any difficulties using your machine >>> We will work to get SD card sent you from your DME supply company  Please contact our office if your symptoms worsen, having issues breathing, or difficulty using her CPAP

## 2018-06-17 NOTE — Assessment & Plan Note (Signed)
Stable today D-dimer was negative on 06/03/2018 BNP was within normal limits  Keep follow-up with cardiology  Follow-up with Dr. Lamonte Sakai in 3 months

## 2018-06-17 NOTE — Progress Notes (Signed)
Reviewed & agree with plan  

## 2018-06-17 NOTE — Assessment & Plan Note (Signed)
Rate today is slightly irregular.  I suspect patient is still having PVCs but is normal sinus rhythm.  We will not do EKG as well as just done last week.  Patient is not symptomatic at this time.  Encourage patient to keep follow-up appointment with cardiology on 06/26/2018

## 2018-06-17 NOTE — Assessment & Plan Note (Signed)
Continue to work towards healthy weight 

## 2018-06-26 ENCOUNTER — Other Ambulatory Visit: Payer: Self-pay | Admitting: Pulmonary Disease

## 2018-06-26 DIAGNOSIS — I480 Paroxysmal atrial fibrillation: Secondary | ICD-10-CM

## 2018-06-26 NOTE — Progress Notes (Signed)
Cardiology Office Note  Date: 06/27/2018   ID: [name], DOB 1941/11/18, MRN [id number]  PCP: Shirline Frees, MD  Primary Cardiologist: Rozann Lesches, MD   Chief Complaint  Patient presents with  . Shortness of Breath    History of Present Illness: [name] is a 77 y.o. male last seen in October 2018.  He is here today for a follow-up visit with his wife.  Cardiac history is outlined below including cardiac catheterization from July 2018.  He complains of dyspnea on exertion described up to NYHA class III, a feeling of intermittent palpitations, no chest tightness or nitroglycerin use however.  He is frustrated with his symptoms.  Interval visits with Pulmonary noted.  D-dimer and BNP levels were normal.  He is being treated for OSA with CPAP.  Further pulmonary imaging studies were not obtained in light of normal d-dimer level and low risk of thromboembolic disease.  I reviewed his recent ECG which showed sinus rhythm and PVC.  He has a previous history of paroxysmal atrial fibrillation, but none documented recently.  Today we discussed his previous cardiac catheterization findings from July 2018.  At that point there was not a clear culprit lesion to definitively explain his shortness of breath.  LVEDP was normal as were right heart pressures.  LVEF was 60% with mild diastolic dysfunction by echocardiogram last year as well.  He has been on a low-dose diuretic but using it intermittently.  Recent BNP level was normal arguing against significant fluid overload as a component of his symptoms.  With his sense of palpitations, we did talk about providing a cardiac monitor to assess for both paroxysmal atrial fibrillation and also frequency of PVCs.  Past Medical History:  Diagnosis Date  . BPH (benign prostatic hyperplasia)   . CAD (coronary artery disease)    s/p CABG 2007; NSTEMI in setting of AFib with RVR 12/2009; cath 1/11: S-RCA ok with prox 30-40% and 50% stenoses;  S-OM and RI  with patent OM limb but an occluded RI limb; S-D2 and L-LAD ok; EF 55%  . Colon cancer ([place]) 1992  . Diverticulosis   . Essential hypertension   . GERD (gastroesophageal reflux disease)   . History of pulmonary embolus (PE) 2007   Following CABG  . Hyperlipidemia   . 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 OSA on CPAP   . Paroxysmal atrial fibrillation (HCC)    Previously on Multaq, declines anticoagulation  . Personal history of colonic polyps 01/22/2000   Tubular adenoma  . RBBB (right bundle branch block)     Past Surgical History:  Procedure Laterality Date  . COLECTOMY  1992   w/ colostomy  related to colon cancer  . CORONARY ARTERY BYPASS GRAFT  2007  . HAND LIGAMENT RECONSTRUCTION  1963 1964   tendon repair and nerve; left  . INGUINAL HERNIA REPAIR  2009  . LAPAROSCOPIC CHOLECYSTECTOMY  2004  . RIGHT/LEFT HEART CATH AND CORONARY/GRAFT ANGIOGRAPHY N/A 07/03/2017   Procedure: Right/Left Heart Cath and Coronary/Graft Angiography;  Surgeon: Leonie Man, MD;  Location: [facility];  Service: Cardiovascular;  Laterality: N/A;  . VENTRAL HERNIA REPAIR  2009    Current Outpatient Medications  Medication Sig Dispense Refill  . atorvastatin (LIPITOR) 20 MG tablet   5  . furosemide (LASIX) 40 MG tablet TAKE ONE TABLET BY MOUTH ONCE DAILY AS NEEDED FOR FLUID OR EDEMA 90 tablet 2  . gabapentin (NEURONTIN) 100 MG  capsule Take 100 mg by mouth 2 (two) times daily.    . Garlic 3762 MG CAPS Take 1,000 mg by mouth daily.    Marland Kitchen HYDROcodone-acetaminophen (NORCO/VICODIN) 5-325 MG tablet Take 1 tablet by mouth every 6 (six) hours as needed for moderate pain.    . isosorbide mononitrate (IMDUR) 30 MG 24 hr tablet TAKE 1 TABLET BY MOUTH ONCE DAILY 90 tablet 0  . losartan (COZAAR) 25 MG tablet   5  . metoprolol succinate (TOPROL-XL) 50 MG 24 hr tablet TAKE 1 TABLET BY MOUTH ONCE DAILY. TAKE  WITH  OR  IMMEDIATELY  FOLLOWING  A  MEAL. 90 tablet 0  . Multiple Vitamin  (MULTIVITAMIN WITH MINERALS) TABS tablet Take 1 tablet by mouth daily.    . naproxen sodium (ANAPROX) 220 MG tablet Take 220 mg by mouth 2 (two) times daily as needed (FOR PAIN.).     Marland Kitchen nitroGLYCERIN (NITROSTAT) 0.4 MG SL tablet Place 1 tablet (0.4 mg total) under the tongue every 5 (five) minutes as needed for chest pain. 25 tablet 3  . Omega-3 Fatty Acids (FISH OIL) 1200 MG CAPS Take 1,200 mg by mouth daily.    Marland Kitchen omeprazole (PRILOSEC) 20 MG capsule Take 20 mg by mouth every other day. IN THE MORNING    . potassium chloride (K-DUR) 10 MEQ tablet Take 10 meq daily WHEN you take lasix (Patient taking differently: Take 10 mEq by mouth daily as needed (for fluid/edema--whenever you take lasix). ) 90 tablet 3  . tamsulosin (FLOMAX) 0.4 MG CAPS capsule Take 0.4 mg by mouth 2 (two) times daily.    Marland Kitchen tiZANidine (ZANAFLEX) 4 MG tablet Take 4 mg by mouth at bedtime as needed for muscle spasms.    Marland Kitchen aspirin EC 81 MG tablet Take 1 tablet (81 mg total) by mouth daily. 30 tablet 3   No current facility-administered medications for this visit.    Allergies:  Patient has no known allergies.   Social History: The patient  reports that he quit smoking about 22 years ago. His smoking use included cigarettes. He has a 45.00 pack-year smoking history. He has never used smokeless tobacco. He reports that he does not drink alcohol or use drugs.   ROS:  Please see the history of present illness. Otherwise, complete review of systems is positive for fatigue.  All other systems are reviewed and negative.   Physical Exam: VS:  BP 140/72   Pulse 67   Ht 5\' 11"  (1.803 m)   Wt 242 lb (109.8 kg)   SpO2 98%   BMI 33.75 kg/m , BMI Body mass index is 33.75 kg/m.  Wt Readings from Last 3 Encounters:  06/27/18 242 lb (109.8 kg)  06/17/18 245 lb (111.1 kg)  06/03/18 241 lb (109.3 kg)    General: Overweight male, appears comfortable at rest. HEENT: Conjunctiva and lids normal, oropharynx clear. Neck: Supple, no  elevated JVP or carotid bruits, no thyromegaly. Lungs: Clear to auscultation, nonlabored breathing at rest. Cardiac: Regular rate and rhythm, no S3, soft significant systolic murmur. Abdomen: Soft, nontender, bowel sounds present. Extremities: No pitting edema, distal pulses 2+. Skin: Warm and dry. Musculoskeletal: No kyphosis. Neuropsychiatric: Alert and oriented x3, affect grossly appropriate.  ECG: I personally reviewed the tracing from 06/03/2018 which showed sinus rhythm with right bundle branch block and PVC.  Recent Labwork: 06/03/2018: BUN 13; Creatinine, Ser 1.16; Hemoglobin 14.6; Platelets 180.0; Potassium 3.9; Pro B Natriuretic peptide (BNP) 63.0; Sodium 138     Component Value  Date/Time   CHOL 134 09/08/2015 1210   TRIG 211.0 (H) 09/08/2015 1210   HDL 36.10 (L) 09/08/2015 1210   CHOLHDL 4 09/08/2015 1210   VLDL 42.2 (H) 09/08/2015 1210   LDLCALC 59 07/23/2014 1638   LDLDIRECT 87.0 09/08/2015 1210    Other Studies Reviewed Today:  Cardiac catheterization 07/03/2017:  Prox RCA lesion, 40 %stenosed. Mid RCA lesion, 60 %stenosed (focal). Dist RCA lesion, 30 %stenosed. - No obstructive disease noted.  [place] lesion, 99 %stenosed. - Essentially the native vessel is subtotally occluded despite being grafted  Prox Cx lesion, 85 %stenosed. 1st Mrg lesion, 100 %stenosed - grafted  Ost 1st Diag lesion, 70 %stenosed. Smooth, likely not acute lesion  Prox LAD lesion, 75 %stenosed. Ost 2nd Diag to 2nd Diag lesion, 100 %stenosed.  Dist LAD lesion, 75 %stenosed relatively focal lesion downstream from LIMA insertion -  LIMA graft was visualized by non-selective angiography and is normal in caliber and anatomically normal.  SVG-RI-OM graft was visualized by angiography and is large. Patent with minimal luminal irregularities.  SVG-dRCA graft was visualized by angiography and is large. Origin to Prox Graft lesion, 40 %stenosed. Insertion lesion, 40 %stenosed.  SVG-Diad2 graft was  visualized by angiography and is moderate in size. Angiographically normal  The left ventricular systolic function is normal.  LV end diastolic pressure is normal.  Normal right heart cath pressures - borderline dehydrated   No significant change to angiography based on report from 2011. It does appear to be ostial first diagonal branches progressed some from before, but this does not appear to be an acute lesion. There is also the distal LAD lesion beyond the graft which is not very amenable to PCI.  Cannot be certain that either the diagonal lesion or the distal LAD lesion are causing his symptoms however. The stress test did not suggest ischemia in this area, in fact there is more related to the inferior wall. Given the difficulty of the procedure and the time intensity associated with it, I felt it more prudent to discontinue the procedure today with plans for medical management and consider the possibility of PCI of the first diagonal branch with a stent from the LAD into the diagonal if FFR positive.  Echocardiogram 03/19/2017: Study Conclusions  - Left ventricle: The cavity size was normal. Wall thickness was   increased in a pattern of severe LVH. Systolic function was   normal. The estimated ejection fraction was in the range of 55%   to 60%. Wall motion was normal; there were no regional wall   motion abnormalities. Doppler parameters are consistent with   abnormal left ventricular relaxation (grade 1 diastolic   dysfunction). - Right ventricle: The cavity size was mildly dilated. Wall   thickness was normal. - Atrial septum: No defect or patent foramen ovale was identified.  Assessment and Plan:  1.  Dyspnea on exertion, chronic at this point.  He has undergone cardiac testing within the last year as detailed above and also recent assessment in the Pulmonary division.  He is clearly frustrated by his symptoms, and we did have an open discussion today about the results of his  testing and at this point the lack of a clear culprit to explain everything.  It may well be that this is a multifactorial process.  As mentioned above, with his sense of palpitations I do think it is important to get further objective information via cardiac monitor, mainly to assess for any potential atrial fibrillation or frequent PVCs  that could be contributing. We will also make a referral for cardiac rehabilitation as a focused exercise plan may be of benefit.  We will bring him back to see how he is doing over the next 4 to 6 weeks.  2.  Intermittent palpitations, 7-day cardiac monitor will be obtained.  3.  CAD status post CABG.  Continue aspirin although at 81 mg daily area he is also on statin therapy, beta-blocker, ARB, and long-acting nitrate.  With possible contributor of diastolic dysfunction, I asked him to take Lasix at 20 mg daily with potassium supplement to see if this had a positive impact on his symptoms.  4.  Mixed hyperlipidemia, on statin therapy.  He continues to follow with PCP.  Current medicines were reviewed with the patient today.   Orders Placed This Encounter  Procedures  . AMB referral to cardiac rehabilitation  . Cardiac event monitor    Disposition: Follow-up in 4 to 6 weeks.  Signed, Satira Sark, MD, [facility] 06/27/2018 12:41 PM    [facility] [place], [place], [place] [address] Phone: [phone]; Fax: [phone]

## 2018-06-27 ENCOUNTER — Encounter: Payer: Self-pay | Admitting: Cardiology

## 2018-06-27 ENCOUNTER — Telehealth: Payer: Self-pay | Admitting: Cardiology

## 2018-06-27 ENCOUNTER — Ambulatory Visit (INDEPENDENT_AMBULATORY_CARE_PROVIDER_SITE_OTHER): Payer: Medicare Other | Admitting: Cardiology

## 2018-06-27 VITALS — BP 140/72 | HR 67 | Ht 71.0 in | Wt 242.0 lb

## 2018-06-27 DIAGNOSIS — R0609 Other forms of dyspnea: Secondary | ICD-10-CM | POA: Diagnosis not present

## 2018-06-27 DIAGNOSIS — I5032 Chronic diastolic (congestive) heart failure: Secondary | ICD-10-CM | POA: Diagnosis not present

## 2018-06-27 DIAGNOSIS — I25119 Atherosclerotic heart disease of native coronary artery with unspecified angina pectoris: Secondary | ICD-10-CM | POA: Diagnosis not present

## 2018-06-27 DIAGNOSIS — I493 Ventricular premature depolarization: Secondary | ICD-10-CM

## 2018-06-27 DIAGNOSIS — E782 Mixed hyperlipidemia: Secondary | ICD-10-CM

## 2018-06-27 MED ORDER — ASPIRIN EC 81 MG PO TBEC: 81.0000 mg | DELAYED_RELEASE_TABLET | Freq: Every day | ORAL | 3 refills | Status: DC

## 2018-06-27 NOTE — Telephone Encounter (Signed)
7 day monitor- pvc

## 2018-06-27 NOTE — Patient Instructions (Signed)
Medication Instructions:  Your physician has recommended you make the following change in your medication:   DECREASE Aspirin to 81 mg   Please continue all other medications as prescribed  Labwork: NONE  Testing/Procedures: Your physician has recommended that you wear an event monitor FOR 7 DAYS. Event monitors are medical devices that record the heart's electrical activity. Doctors most often [place] these monitors to diagnose arrhythmias. Arrhythmias are problems with the speed or rhythm of the heartbeat. The monitor is a small, portable device. You can wear one while you do your normal daily activities. This is usually used to diagnose what is causing palpitations/syncope (passing out).  Follow-Up: Your physician recommends that you schedule a follow-up appointment in: 3-4 WEEKS WITH DR. MCDOWELL  Any Other Special Instructions Will Be Listed Below (If Applicable).  If you need a refill on your cardiac medications before your next appointment, please call your pharmacy.

## 2018-07-02 ENCOUNTER — Ambulatory Visit (INDEPENDENT_AMBULATORY_CARE_PROVIDER_SITE_OTHER): Payer: Medicare Other

## 2018-07-02 DIAGNOSIS — I493 Ventricular premature depolarization: Secondary | ICD-10-CM | POA: Diagnosis not present

## 2018-07-22 ENCOUNTER — Other Ambulatory Visit: Payer: Self-pay | Admitting: Cardiology

## 2018-07-22 ENCOUNTER — Other Ambulatory Visit: Payer: Self-pay | Admitting: Family Medicine

## 2018-07-22 ENCOUNTER — Ambulatory Visit
Admission: RE | Admit: 2018-07-22 | Discharge: 2018-07-22 | Disposition: A | Discharge status: Home / Self Care | Payer: Medicare Other | Source: Ambulatory Visit | Attending: Family Medicine | Admitting: Family Medicine

## 2018-07-22 DIAGNOSIS — J209 Acute bronchitis, unspecified: Secondary | ICD-10-CM

## 2018-07-31 ENCOUNTER — Other Ambulatory Visit: Payer: Self-pay | Admitting: Surgery

## 2018-07-31 DIAGNOSIS — I712 Thoracic aortic aneurysm, without rupture, unspecified: Secondary | ICD-10-CM

## 2018-08-01 ENCOUNTER — Encounter: Payer: Self-pay | Admitting: Internal Medicine

## 2018-08-06 ENCOUNTER — Telehealth: Payer: Self-pay | Admitting: Pulmonary Disease

## 2018-08-06 NOTE — Telephone Encounter (Signed)
08/06/18 0935  Cardiology note received from Dr. Irven Shelling office.  Date of encounter is 07/31/2018.  Patient is now establishing with Dr. Einar Gip for cardiology.  I have reviewed this and put into be signed into the chart  Plan: Readdress obstructive sleep apnea next pulmonary visit -patient is willing to try treatment for obstructive sleep apnea again.  Consider home sleep study at next office visit, increase metoprolol to 50 mg twice daily, follow-up in 6 weeks with Dr. Einar Gip  Samuel Quaker, FNP [facility]

## 2018-08-08 ENCOUNTER — Ambulatory Visit: Payer: Medicare Other | Admitting: Emergency Medicine

## 2018-08-08 ENCOUNTER — Encounter: Payer: Self-pay | Admitting: Emergency Medicine

## 2018-08-08 ENCOUNTER — Ambulatory Visit: Payer: Medicare Other | Admitting: Cardiology

## 2018-08-08 ENCOUNTER — Encounter: Payer: Self-pay | Admitting: Cardiology

## 2018-08-08 VITALS — BP 104/65 | HR 74 | Ht 71.0 in | Wt 246.8 lb

## 2018-08-08 DIAGNOSIS — G4733 Obstructive sleep apnea (adult) (pediatric): Secondary | ICD-10-CM

## 2018-08-08 DIAGNOSIS — R0602 Shortness of breath: Secondary | ICD-10-CM | POA: Diagnosis not present

## 2018-08-08 DIAGNOSIS — R0609 Other forms of dyspnea: Secondary | ICD-10-CM

## 2018-08-08 DIAGNOSIS — I4729 Other ventricular tachycardia: Secondary | ICD-10-CM

## 2018-08-08 DIAGNOSIS — I472 Ventricular tachycardia: Secondary | ICD-10-CM | POA: Diagnosis not present

## 2018-08-08 DIAGNOSIS — I25119 Atherosclerotic heart disease of native coronary artery with unspecified angina pectoris: Secondary | ICD-10-CM

## 2018-08-08 DIAGNOSIS — E782 Mixed hyperlipidemia: Secondary | ICD-10-CM

## 2018-08-08 DIAGNOSIS — J449 Chronic obstructive pulmonary disease, unspecified: Secondary | ICD-10-CM | POA: Diagnosis not present

## 2018-08-08 DIAGNOSIS — I5032 Chronic diastolic (congestive) heart failure: Secondary | ICD-10-CM | POA: Diagnosis not present

## 2018-08-08 MED ORDER — FUROSEMIDE 20 MG PO TABS: 20.0000 mg | ORAL_TABLET | Freq: Every day | ORAL | 3 refills | Status: DC

## 2018-08-08 MED ORDER — POTASSIUM CHLORIDE ER 10 MEQ PO TBCR: 10.0000 meq | EXTENDED_RELEASE_TABLET | Freq: Every day | ORAL | 3 refills | Status: DC

## 2018-08-08 MED ORDER — ALBUTEROL SULFATE HFA 108 (90 BASE) MCG/ACT IN AERS: 2.0000 | INHALATION_SPRAY | RESPIRATORY_TRACT | 5 refills | Status: DC | PRN

## 2018-08-08 NOTE — Addendum Note (Signed)
Addended by: Desmond Dike C on: 08/08/2018 10:57 AM   Modules accepted: Orders

## 2018-08-08 NOTE — Patient Instructions (Addendum)
We will perform a repeat split-night Sleep Study. Based on the result we will arrange for a new CPAP machine and mask.  We will perform full pulmonary function testing.  We will teach you how to use albuterol. You may use 2 puffs up to every 4 hours if needed for shortness of breath.  Follow with Drs Nadyne Coombes and Domenic Polite as planned.  Continue your medications as ordered.  Follow with Dr Lamonte Sakai in 1 month to review your testing

## 2018-08-08 NOTE — Assessment & Plan Note (Signed)
Mild obstruction on his former PFT, never has been on scheduled bronchodilators.  He had a recent episode of bronchitis that sounded like an acute exacerbation of COPD.  I suspect he will benefit from scheduled medications now.  We will repeat his PFT, review these and talk about starting therapy when he follows up.

## 2018-08-08 NOTE — Progress Notes (Signed)
Patient seen in the office today and instructed on use of Albuterol HFA.  Patient expressed understanding and demonstrated technique.  

## 2018-08-08 NOTE — Progress Notes (Signed)
Cardiology Office Note  Date: 08/08/2018   ID: [name], DOB November 01, 1941, MRN [id number]  PCP: Shirline Frees, MD  Primary Cardiologist: Rozann Lesches, MD   Chief Complaint  Patient presents with  . Shortness of Breath    History of Present Illness: [name] is a 77 y.o. male last seen in July 2019.  He presents for a scheduled follow-up visit.  He does not report any improvement in symptoms since last visit.  Still describes dyspnea on exertion, sometimes dyspnea at rest, no obvious wheezing, no progressive palpitations, no specific chest tightness or nitroglycerin use, no syncope.  He continues to follow in the Pulmonary division with plan for follow-up PFTs.  He also had a cardiology evaluation by Dr. Einar Gip and plans to transition his cardiology follow-up to Dr. Einar Gip going forward.  He did wear a 7-day event monitor since her last visit, I reviewed the tracings.  Sinus rhythm was present with heart rate ranging from 56 bpm up to 111 bpm, average heart rate 76 bpm.  He had relatively frequent PVCs and also an episode of NSVT on July 17.  Interestingly, he tells me that he did not notice any specific worsening in symptoms, palpitations, or lightheadedness while wearing the monitor.  I reviewed his medications, Toprol-XL dose was doubled by Dr. Einar Gip, he seems to be tolerating this change.  Otherwise no change in additional medications including aspirin, Lipitor, Lasix, Imdur, Cozaar, potassium supplements, and as needed nitroglycerin.  Today we discussed the results of his heart monitor, also went over his medications.  I talked with him about anticipated next steps in his evaluation.  At this point I would generally pursue a follow-up cardiac catheterization and also an echocardiogram since he has not shown significant improvement following medication adjustments.  I anticipate that Dr. Einar Gip will most likely take this course. Mr. Falls voiced appreciation of his care  through our practice and anticipates following with Dr. Einar Gip for further testing going forward.  Past Medical History:  Diagnosis Date  . BPH (benign prostatic hyperplasia)   . CAD (coronary artery disease)    s/p CABG 2007; NSTEMI in setting of AFib with RVR 12/2009; cath 1/11: S-RCA ok with prox 30-40% and 50% stenoses; S-OM and RI  with patent OM limb but an occluded RI limb; S-D2 and L-LAD ok; EF 55%  . Colon cancer ([place]) 1992  . Diverticulosis   . Essential hypertension   . GERD (gastroesophageal reflux disease)   . History of pulmonary embolus (PE) 2007   Following CABG  . Hyperlipidemia   . 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 OSA on CPAP   . Paroxysmal atrial fibrillation ([facility])    Previously on Multaq, declines anticoagulation  . Personal history of colonic polyps 01/22/2000   Tubular adenoma  . RBBB (right bundle branch block)     Past Surgical History:  Procedure Laterality Date  . COLECTOMY  1992   w/ colostomy  related to colon cancer  . CORONARY ARTERY BYPASS GRAFT  2007  . HAND LIGAMENT RECONSTRUCTION  1963 1964   tendon repair and nerve; left  . INGUINAL HERNIA REPAIR  2009  . LAPAROSCOPIC CHOLECYSTECTOMY  2004  . RIGHT/LEFT HEART CATH AND CORONARY/GRAFT ANGIOGRAPHY N/A 07/03/2017   Procedure: Right/Left Heart Cath and Coronary/Graft Angiography;  Surgeon: Leonie Man, MD;  Location: [facility];  Service: Cardiovascular;  Laterality: N/A;  . VENTRAL HERNIA REPAIR  2009  Current Outpatient Medications  Medication Sig Dispense Refill  . albuterol (PROVENTIL HFA;VENTOLIN HFA) 108 (90 Base) MCG/ACT inhaler Inhale 2 puffs into the lungs every 4 (four) hours as needed for wheezing or shortness of breath. 1 Inhaler 5  . aspirin EC 81 MG tablet Take 1 tablet (81 mg total) by mouth daily. 30 tablet 3  . atorvastatin (LIPITOR) 20 MG tablet   5  . furosemide (LASIX) 20 MG tablet Take 1 tablet (20 mg total) by mouth daily. 30 tablet 3  . gabapentin  (NEURONTIN) 100 MG capsule Take 100 mg by mouth 2 (two) times daily.    . Garlic 8413 MG CAPS Take 1,000 mg by mouth daily.    Marland Kitchen HYDROcodone-acetaminophen (NORCO/VICODIN) 5-325 MG tablet Take 1 tablet by mouth every 6 (six) hours as needed for moderate pain.    . isosorbide mononitrate (IMDUR) 30 MG 24 hr tablet TAKE 1 TABLET BY MOUTH ONCE DAILY 90 tablet 0  . losartan (COZAAR) 25 MG tablet   5  . metoprolol succinate (TOPROL-XL) 50 MG 24 hr tablet Take 50 mg by mouth 2 (two) times daily. Take with or immediately following a meal.    . Multiple Vitamin (MULTIVITAMIN WITH MINERALS) TABS tablet Take 1 tablet by mouth daily.    . naproxen sodium (ANAPROX) 220 MG tablet Take 220 mg by mouth 2 (two) times daily as needed (FOR PAIN.).     Marland Kitchen nitroGLYCERIN (NITROSTAT) 0.4 MG SL tablet Place 1 tablet (0.4 mg total) under the tongue every 5 (five) minutes as needed for chest pain. 25 tablet 3  . Omega-3 Fatty Acids (FISH OIL) 1200 MG CAPS Take 1,200 mg by mouth daily.    Marland Kitchen omeprazole (PRILOSEC) 20 MG capsule Take 20 mg by mouth every other day. IN THE MORNING    . potassium chloride (K-DUR) 10 MEQ tablet Take 1 tablet (10 mEq total) by mouth daily. 30 tablet 3  . tamsulosin (FLOMAX) 0.4 MG CAPS capsule Take 0.4 mg by mouth 2 (two) times daily.    Marland Kitchen tiZANidine (ZANAFLEX) 4 MG tablet Take 4 mg by mouth at bedtime as needed for muscle spasms.     No current facility-administered medications for this visit.    Allergies:  Patient has no known allergies.   Social History: The patient  reports that he quit smoking about 22 years ago. His smoking use included cigarettes. He has a 45.00 pack-year smoking history. He has never used smokeless tobacco. He reports that he does not drink alcohol or use drugs.   ROS:  Please see the history of present illness. Otherwise, complete review of systems is positive for reported interval URI treated with antibiotics.  All other systems are reviewed and negative.   Physical  Exam: VS:  BP 104/65   Pulse 74   Ht 5\' 11"  (1.803 m)   Wt 246 lb 12.8 oz (111.9 kg)   SpO2 94%   BMI 34.42 kg/m , BMI Body mass index is 34.42 kg/m.  Wt Readings from Last 3 Encounters:  08/08/18 246 lb 12.8 oz (111.9 kg)  08/08/18 245 lb (111.1 kg)  06/27/18 242 lb (109.8 kg)    General: Overweight male, appears comfortable at rest. HEENT: Conjunctiva and lids normal, oropharynx clear. Neck: Supple, no elevated JVP or carotid bruits, no thyromegaly. Lungs: No wheezing, nonlabored breathing at rest. Cardiac: Regular rate and rhythm, no S3, soft systolic murmur. Abdomen: Soft, nontender, bowel sounds present. Extremities: No pitting edema, distal pulses 2+. Skin: Warm and  dry. Musculoskeletal: No kyphosis. Neuropsychiatric: Alert and oriented x3, affect grossly appropriate.  ECG: I personally reviewed the tracing from 06/03/2018 which showed sinus rhythm with right bundle branch block and PVC.  Recent Labwork: 06/03/2018: BUN 13; Creatinine, Ser 1.16; Hemoglobin 14.6; Platelets 180.0; Potassium 3.9; Pro B Natriuretic peptide (BNP) 63.0; Sodium 138     Component Value Date/Time   CHOL 134 09/08/2015 1210   TRIG 211.0 (H) 09/08/2015 1210   HDL 36.10 (L) 09/08/2015 1210   CHOLHDL 4 09/08/2015 1210   VLDL 42.2 (H) 09/08/2015 1210   LDLCALC 59 07/23/2014 1638   LDLDIRECT 87.0 09/08/2015 1210    Other Studies Reviewed Today:  Cardiac catheterization 07/03/2017:  Prox RCA lesion, 40 %stenosed. Mid RCA lesion, 60 %stenosed (focal). Dist RCA lesion, 30 %stenosed. - No obstructive disease noted.  Ramus lesion, 99 %stenosed. - Essentially the native vessel is subtotally occluded despite being grafted  Prox Cx lesion, 85 %stenosed. 1st Mrg lesion, 100 %stenosed - grafted  Ost 1st Diag lesion, 70 %stenosed. Smooth, likely not acute lesion  Prox LAD lesion, 75 %stenosed. Ost 2nd Diag to 2nd Diag lesion, 100 %stenosed.  Dist LAD lesion, 75 %stenosed relatively focal lesion  downstream from LIMA insertion -  LIMA graft was visualized by non-selective angiography and is normal in caliber and anatomically normal.  SVG-RI-OM graft was visualized by angiography and is large. Patent with minimal luminal irregularities.  SVG-dRCA graft was visualized by angiography and is large. Origin to Prox Graft lesion, 40 %stenosed. Insertion lesion, 40 %stenosed.  SVG-Diad2 graft was visualized by angiography and is moderate in size. Angiographically normal  The left ventricular systolic function is normal.  LV end diastolic pressure is normal.  Normal right heart cath pressures - borderline dehydrated  No significant change to angiography based on report from 2011. It does appear to be ostial first diagonal branches progressed some from before, but this does not appear to be an acute lesion. There is also the distal LAD lesion beyond the graft which is not very amenable to PCI.  Cannot be certain that either the diagonal lesion or the distal LAD lesion are causing his symptoms however. The stress test did not suggest ischemia in this area, in fact there is more related to the inferior wall. Given the difficulty of the procedure and the time intensity associated with it, I felt it more prudent to discontinue the procedure today with plans for medical management and consider the possibility of PCI of the first diagonal branch with a stent from the LAD into the diagonal if FFR positive.  Echocardiogram 03/19/2017: Study Conclusions  - Left ventricle: The cavity size was normal. Wall thickness was increased in a pattern of severe LVH. Systolic function was normal. The estimated ejection fraction was in the range of 55% to 60%. Wall motion was normal; there were no regional wall motion abnormalities. Doppler parameters are consistent with abnormal left ventricular relaxation (grade 1 diastolic dysfunction). - Right ventricle: The cavity size was mildly dilated.  Wall thickness was normal. - Atrial septum: No defect or patent foramen ovale was identified.  Assessment and Plan:  1.  Shortness of breath, fairly chronic at this point but worsening over the last year.  He has undergone adjustments in his cardiac medical regimen, also continuing input from the Pulmonary division. As noted above, he plans to transition his further cardiac follow-up and evaluation to Dr. Einar Gip who I think will do an excellent job.  I talked with Mr. Wimer about  a follow-up cardiac catheterization and echocardiogram as next steps, although obviously I will defer this to Dr. Einar Gip.  2.  PVCs and burst of NSVT by interval cardiac monitor.  LVEF was normal by echocardiogram last year and his coronary/bypass graft anatomy was felt to be best managed medically based on cardiac catheterization from July 2018.  He has undergone medication adjustments including recent increase in beta-blocker.  No syncope.  Pending ollow-up ischemic evaluation and reassessment of LVEF, if revascularization is not required, and PVC frequency increases on medical therapy, he may ultimately be a candidate for ablation.  3.  CAD status post CABG.  No changes made to present medical regimen.  4.  Diastolic dysfunction, he continues on low-dose Lasix with potassium supplements.  Weight has been relatively stable.  5.  Mixed hyperlipidemia, continues on statin therapy with follow-up per PCP.  Current medicines were reviewed with the patient today.  Disposition: Further cardiac follow-up and evaluation per Dr. Einar Gip.  Signed, Satira Sark, MD, [facility] 08/08/2018 2:19 PM    [facility] [place], [place], [place] [address] Phone: [phone]; Fax: [phone]

## 2018-08-08 NOTE — Assessment & Plan Note (Signed)
Suspect multifactorial with contribution of his underlying heart disease especially given the fact that he has been in and out of atrial fibrillation, has had some tachycardia.  He has an event monitor that is to be reviewed with Dr. Domenic Polite today, is now following also with Dr. Einar Gip and his Toprol was recently increased.  He does not appear to be volume overloaded today.  Suspect that there is also a component of underlying lung disease.  He had mild obstruction on point function testing over 8 years ago, needs repeat PFT now.  I suspect he will benefit from a schedule bronchodilator.  In the meantime we will treat him with albuterol to use as needed.  He will report back whether he tolerates, benefits.

## 2018-08-08 NOTE — Assessment & Plan Note (Signed)
He has used his CPAP briefly in spots but for the most part he has been noncompliant.  Agree that he needs to wear it, it will make his other issues including his atrial fibrillation more easily managed.  He needs a repeat sleep study in order to get a new device and we will arrange for this.

## 2018-08-08 NOTE — Patient Instructions (Signed)
Medication Instructions:   Your physician recommends that you continue on your current medications as directed. Please refer to the Current Medication list given to you today.  Labwork:  NONE  Testing/Procedures:  NONE  Follow-Up:  Your physician recommends that you schedule a follow-up appointment in: as needed.   Any Other Special Instructions Will Be Listed Below (If Applicable).  If you need a refill on your cardiac medications before your next appointment, please call your pharmacy. 

## 2018-08-08 NOTE — Progress Notes (Signed)
Subjective:    Patient ID: NAGI FURIO, male    DOB: 27-May-1941, 77 y.o.   MRN: [id number]  Shortness of Breath  Pertinent negatives include no ear pain, fever, headaches, leg swelling, rash, rhinorrhea, sore throat, vomiting or wheezing.   Acute OV 08/08/18 --77 year old gentleman whom I have followed for former tobacco use (45 pack years) and mild obstructive lung disease (2016), obstructive sleep apnea with poor CPAP compliance.  He has a history of coronary artery disease/CABG, atrial fibrillation.  He has been seen in our office over the last 2 months with waxing and waning acute dyspnea.  It was felt that this could be related to his volume status, his prn lasix was converted to 20mg  scheduled, metoprolol was increased.  He was treated for an acute bronchitis by Dr Moreen Fowler about 2 weeks ago and was treated w abx, cough and mucous improved. He has established with Dr Nadyne Coombes for cardiology, also to follow with Dr Domenic Polite.   He still has exertional SOB, also can happen at rest. No wheeze, minimal cough.   Review of Systems  Constitutional: Positive for fatigue. Negative for fever and unexpected weight change.  HENT: Negative for congestion, dental problem, ear pain, nosebleeds, postnasal drip, rhinorrhea, sinus pressure, sneezing, sore throat and trouble swallowing.   Eyes: Negative for redness and itching.  Respiratory: Negative for cough, chest tightness, shortness of breath and wheezing.   Cardiovascular: Negative for palpitations and leg swelling.  Gastrointestinal: Negative for nausea and vomiting.  Genitourinary: Negative for dysuria.  Musculoskeletal: Negative for joint swelling and myalgias.  Skin: Negative for rash.  Neurological: Negative for headaches.  Hematological: Does not bruise/bleed easily.  Psychiatric/Behavioral: Negative for dysphoric mood. The patient is not nervous/anxious.    Past Medical History:  Diagnosis Date  . BPH (benign prostatic hyperplasia)   . CAD  (coronary artery disease)    s/p CABG 2007; NSTEMI in setting of AFib with RVR 12/2009; cath 1/11: S-RCA ok with prox 30-40% and 50% stenoses; S-OM and RI  with patent OM limb but an occluded RI limb; S-D2 and L-LAD ok; EF 55%  . Colon cancer ([place]) 1992  . Diverticulosis   . Essential hypertension   . GERD (gastroesophageal reflux disease)   . History of pulmonary embolus (PE) 2007   Following CABG  . Hyperlipidemia   . 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 OSA on CPAP   . Paroxysmal atrial fibrillation (HCC)    Previously on Multaq, declines anticoagulation  . Personal history of colonic polyps 01/22/2000   Tubular adenoma  . RBBB (right bundle branch block)      Family History  Problem Relation Age of Onset  . Lung cancer Mother   . Heart disease Mother   . Emphysema Father   . Heart disease Father   . Colon cancer Sister   . Breast cancer Sister   . Breast cancer Sister      Social History   Socioeconomic History  . Marital status: Married    Spouse name: Not on file  . Number of children: 4  . Years of education: Not on file  . Highest education level: Not on file  Occupational History  . Occupation: Retired    Comment: Chiropractor of [employer]  . Financial resource strain: Not on file  . Food insecurity:    Worry: Not on file    Inability: Not on file  . Transportation needs:  Medical: Not on file    Non-medical: Not on file  Tobacco Use  . Smoking status: Former Smoker    Packs/day: 1.00    Years: 45.00    Pack years: 45.00    Types: Cigarettes    Last attempt to quit: 12/18/1995    Years since quitting: 22.6  . Smokeless tobacco: Never Used  Substance and Sexual Activity  . Alcohol use: No    Comment: quit in 1980  . Drug use: No  . Sexual activity: Not on file  Lifestyle  . Physical activity:    Days per week: Not on file    Minutes per session: Not on file  . Stress: Not on file  Relationships  . Social connections:    Talks on  phone: Not on file    Gets together: Not on file    Attends religious service: Not on file    Active member of club or organization: Not on file    Attends meetings of clubs or organizations: Not on file    Relationship status: Not on file  . Intimate partner violence:    Fear of current or ex partner: Not on file    Emotionally abused: Not on file    Physically abused: Not on file    Forced sexual activity: Not on file  Other Topics Concern  . Not on file  Social History Narrative   Daily caffeine   he has been exposed to animals - hogs, mules.    No Known Allergies   Outpatient Medications Prior to Visit  Medication Sig Dispense Refill  . aspirin EC 81 MG tablet Take 1 tablet (81 mg total) by mouth daily. 30 tablet 3  . atorvastatin (LIPITOR) 20 MG tablet   5  . furosemide (LASIX) 40 MG tablet TAKE ONE TABLET BY MOUTH ONCE DAILY AS NEEDED FOR FLUID OR EDEMA 90 tablet 2  . gabapentin (NEURONTIN) 100 MG capsule Take 100 mg by mouth 2 (two) times daily.    . Garlic 2694 MG CAPS Take 1,000 mg by mouth daily.    Marland Kitchen HYDROcodone-acetaminophen (NORCO/VICODIN) 5-325 MG tablet Take 1 tablet by mouth every 6 (six) hours as needed for moderate pain.    . isosorbide mononitrate (IMDUR) 30 MG 24 hr tablet TAKE 1 TABLET BY MOUTH ONCE DAILY 90 tablet 0  . losartan (COZAAR) 25 MG tablet   5  . metoprolol succinate (TOPROL-XL) 50 MG 24 hr tablet TAKE 1 TABLET BY MOUTH ONCE DAILY WITH  OR  IMMEDIATELY  FOLLOWING  A  MEAL 90 tablet 0  . Multiple Vitamin (MULTIVITAMIN WITH MINERALS) TABS tablet Take 1 tablet by mouth daily.    . naproxen sodium (ANAPROX) 220 MG tablet Take 220 mg by mouth 2 (two) times daily as needed (FOR PAIN.).     Marland Kitchen nitroGLYCERIN (NITROSTAT) 0.4 MG SL tablet Place 1 tablet (0.4 mg total) under the tongue every 5 (five) minutes as needed for chest pain. 25 tablet 3  . Omega-3 Fatty Acids (FISH OIL) 1200 MG CAPS Take 1,200 mg by mouth daily.    Marland Kitchen omeprazole (PRILOSEC) 20 MG capsule  Take 20 mg by mouth every other day. IN THE MORNING    . potassium chloride (K-DUR) 10 MEQ tablet Take 10 meq daily WHEN you take lasix (Patient taking differently: Take 10 mEq by mouth daily as needed (for fluid/edema--whenever you take lasix). ) 90 tablet 3  . tamsulosin (FLOMAX) 0.4 MG CAPS capsule Take 0.4 mg by mouth  2 (two) times daily.    Marland Kitchen tiZANidine (ZANAFLEX) 4 MG tablet Take 4 mg by mouth at bedtime as needed for muscle spasms.     No facility-administered medications prior to visit.          Objective:   Physical Exam Vitals:   08/08/18 1029  BP: 124/86  Pulse: 73  SpO2: 94%  Weight: 245 lb (111.1 kg)  Height: 5\' 11"  (1.803 m)   Gen: Pleasant, well-nourished, in no distress,  normal affect  ENT: No lesions,  mouth clear,  oropharynx clear, no postnasal drip  Neck: No JVD, no stridor  Lungs: No use of accessory muscles, no wheeze, decreased at bases  Cardiovascular:  Regular, no evidence a fib currently   Musculoskeletal: amputation R index finger, no cyanosis or clubbing  Neuro: alert, non focal  Skin: Warm, no lesions or rashes     Assessment & Plan:  Obstructive sleep apnea He has used his CPAP briefly in spots but for the most part he has been noncompliant.  Agree that he needs to wear it, it will make his other issues including his atrial fibrillation more easily managed.  He needs a repeat sleep study in order to get a new device and we will arrange for this.  Dyspnea on exertion Suspect multifactorial with contribution of his underlying heart disease especially given the fact that he has been in and out of atrial fibrillation, has had some tachycardia.  He has an event monitor that is to be reviewed with Dr. Domenic Polite today, is now following also with Dr. Einar Gip and his Toprol was recently increased.  He does not appear to be volume overloaded today.  Suspect that there is also a component of underlying lung disease.  He had mild obstruction on point  function testing over 8 years ago, needs repeat PFT now.  I suspect he will benefit from a schedule bronchodilator.  In the meantime we will treat him with albuterol to use as needed.  He will report back whether he tolerates, benefits.  COPD (chronic obstructive pulmonary disease) (HCC) Mild obstruction on his former PFT, never has been on scheduled bronchodilators.  He had a recent episode of bronchitis that sounded like an acute exacerbation of COPD.  I suspect he will benefit from scheduled medications now.  We will repeat his PFT, review these and talk about starting therapy when he follows up.  Baltazar Apo, MD, PhD 08/08/2018, 10:52 AM [facility] [phone] or if no answer [phone]

## 2018-08-20 ENCOUNTER — Ambulatory Visit: Payer: Medicare Other | Attending: Emergency Medicine | Admitting: Pulmonary Disease

## 2018-08-20 DIAGNOSIS — G4733 Obstructive sleep apnea (adult) (pediatric): Secondary | ICD-10-CM | POA: Diagnosis not present

## 2018-08-20 DIAGNOSIS — J449 Chronic obstructive pulmonary disease, unspecified: Secondary | ICD-10-CM | POA: Diagnosis not present

## 2018-08-20 DIAGNOSIS — I493 Ventricular premature depolarization: Secondary | ICD-10-CM | POA: Insufficient documentation

## 2018-08-30 NOTE — Procedures (Signed)
   Patient Name: [name], [name] Date: 08/20/2018   Gender: Male  D.O.B: June 17, 1941  Age (years): 77  Referring Provider: Baltazar Apo  Height (inches): 24  Interpreting Physician: Chesley Mires MD, ABSM  Weight (lbs): 245  RPSGT: Peak, Jaidan  BMI: 34  MRN: [id number]  Neck Size: 19.00  <br> <br>  CLINICAL INFORMATION  Sleep Study Type: Split Night CPAP Indication for sleep study: 77 yo male with history of COPD and obstructive sleep apnea. He presents to the sleep lab for re-evaluation of obstructive sleep. Epworth Sleepiness Score: 9 SLEEP STUDY TECHNIQUE  As per the AASM Manual for the Scoring of Sleep and Associated Events v2.3 (April 2016) with a hypopnea requiring 4% desaturations. The channels recorded and monitored were frontal, central and occipital EEG, electrooculogram (EOG), submentalis EMG (chin), nasal and oral airflow, thoracic and abdominal wall motion, anterior tibialis EMG, snore microphone, electrocardiogram, and pulse oximetry. Continuous positive airway pressure (CPAP) was initiated when the patient met split night criteria and was titrated according to treat sleep-disordered breathing. MEDICATIONS  Medications self-administered by patient taken the night of the study : N/A RESPIRATORY PARAMETERS  Diagnostic Total AHI (/hr): 62.4 RDI (/hr): 70.0 OA Index (/hr): 9 CA Index (/hr): 2.9  REM AHI (/hr): 48.0 NREM AHI (/hr): 63.0 Supine AHI (/hr): N/A Non-supine AHI (/hr): 62.4  Min O2 Sat (%): 80.0 Mean O2 (%): 90.5 Time below 88% (min): 19.9      Titration Optimal Pressure (cm): 11 AHI at Optimal Pressure (/hr): 0.0 Min O2 at Optimal Pressure (%): 90.0  Supine % at Optimal (%): 0 Sleep % at Optimal (%): 100      SLEEP ARCHITECTURE  The recording time for the entire night was 426.1 minutes. During a baseline period of 198.0 minutes, the patient slept for 126.0 minutes in REM and nonREM, yielding a sleep efficiency of 63.6%%. Sleep onset after lights out  was 38.7 minutes with a REM latency of 127.0 minutes. The patient spent 21.4%% of the night in stage N1 sleep, 74.6%% in stage N2 sleep, 0.0%% in stage N3 and 4% in REM. During the titration period of 217.0 minutes, the patient slept for 188.5 minutes in REM and nonREM, yielding a sleep efficiency of 86.8%%. Sleep onset after CPAP initiation was 13.1 minutes with a REM latency of 62.0 minutes. The patient spent 9.6%% of the night in stage N1 sleep, 67.1%% in stage N2 sleep, 0.0%% in stage N3 and 23.3% in REM. CARDIAC DATA  The 2 lead EKG demonstrated sinus rhythm. The mean heart rate was 100.0 beats per minute. Other EKG findings include: PVCs.  LEG MOVEMENT DATA  The total Periodic Limb Movements of Sleep (PLMS) were 4. The PLMS index was 0.8 . IMPRESSIONS  - Severe obstructive sleep apnea with an AHI of 62.4 and SpO2 low of 80%.  - He did well with CPAP 11 cm H2O. He did not require the use of supplemental oxygen during this study. DIAGNOSIS  - Obstructive Sleep Apnea (327.23 [G47.33 ICD-10]) RECOMMENDATIONS  - Trial of CPAP therapy on 11 cm H2O with a Medium size Resmed Nasal CPAP Mask AirFit N30i mask and heated humidification. [Electronically signed] 08/30/2018 09:39 PM Chesley Mires MD, [place], American Board of Sleep Medicine  NPI: [id number]

## 2018-08-30 NOTE — Procedures (Signed)
   Patient Name: [name], [name] Date: 08/20/2018   Gender: Male  D.O.B: [birth year]  Age (years): 77  Referring Provider: Baltazar Apo  Height (inches): 26  Interpreting Physician: Chesley Mires MD, ABSM  Weight (lbs): 245  RPSGT: Peak, Nalu  BMI: 34  MRN: [id number]  Neck Size: 19.00  <br> <br>  CLINICAL INFORMATION  Sleep Study Type: Split Night CPAP Indication for sleep study: 77 yo male with history of COPD and obstructive sleep apnea. He presents to the sleep lab for re-evaluation of obstructive sleep. Epworth Sleepiness Score: 9 SLEEP STUDY TECHNIQUE  As per the AASM Manual for the Scoring of Sleep and Associated Events v2.3 (April 2016) with a hypopnea requiring 4% desaturations. The channels recorded and monitored were frontal, central and occipital EEG, electrooculogram (EOG), submentalis EMG (chin), nasal and oral airflow, thoracic and abdominal wall motion, anterior tibialis EMG, snore microphone, electrocardiogram, and pulse oximetry. Continuous positive airway pressure (CPAP) was initiated when the patient met split night criteria and was titrated according to treat sleep-disordered breathing. MEDICATIONS  Medications self-administered by patient taken the night of the study : N/A RESPIRATORY PARAMETERS  Diagnostic Total AHI (/hr): 62.4 RDI (/hr): 70.0 OA Index (/hr): 9 CA Index (/hr): 2.9  REM AHI (/hr): 48.0 NREM AHI (/hr): 63.0 Supine AHI (/hr): N/A Non-supine AHI (/hr): 62.4  Min O2 Sat (%): 80.0 Mean O2 (%): 90.5 Time below 88% (min): 19.9      Titration Optimal Pressure (cm): 11 AHI at Optimal Pressure (/hr): 0.0 Min O2 at Optimal Pressure (%): 90.0  Supine % at Optimal (%): 0 Sleep % at Optimal (%): 100      SLEEP ARCHITECTURE  The recording time for the entire night was 426.1 minutes. During a baseline period of 198.0 minutes, the patient slept for 126.0 minutes in REM and nonREM, yielding a sleep efficiency of 63.6%%. Sleep onset after lights out  was 38.7 minutes with a REM latency of 127.0 minutes. The patient spent 21.4%% of the night in stage N1 sleep, 74.6%% in stage N2 sleep, 0.0%% in stage N3 and 4% in REM. During the titration period of 217.0 minutes, the patient slept for 188.5 minutes in REM and nonREM, yielding a sleep efficiency of 86.8%%. Sleep onset after CPAP initiation was 13.1 minutes with a REM latency of 62.0 minutes. The patient spent 9.6%% of the night in stage N1 sleep, 67.1%% in stage N2 sleep, 0.0%% in stage N3 and 23.3% in REM. CARDIAC DATA  The 2 lead EKG demonstrated sinus rhythm. The mean heart rate was 100.0 beats per minute. Other EKG findings include: PVCs.  LEG MOVEMENT DATA  The total Periodic Limb Movements of Sleep (PLMS) were 4. The PLMS index was 0.8 . IMPRESSIONS  - Severe obstructive sleep apnea with an AHI of 62.4 and SpO2 low of 80%.  - He did well with CPAP 11 cm H2O. He did not require the use of supplemental oxygen during this study. DIAGNOSIS  - Obstructive Sleep Apnea (327.23 [G47.33 ICD-10]) RECOMMENDATIONS  - Trial of CPAP therapy on 11 cm H2O with a Medium size Resmed Nasal CPAP Mask AirFit N30i mask and heated humidification. [Electronically signed] 08/30/2018 09:39 PM Chesley Mires MD, [employer], American Board of Sleep Medicine  NPI: [id number]

## 2018-09-10 ENCOUNTER — Encounter: Payer: Self-pay | Admitting: Surgery

## 2018-09-10 ENCOUNTER — Ambulatory Visit: Payer: Medicare Other | Admitting: Surgery

## 2018-09-10 ENCOUNTER — Other Ambulatory Visit: Payer: Self-pay

## 2018-09-10 ENCOUNTER — Ambulatory Visit
Admission: RE | Admit: 2018-09-10 | Discharge: 2018-09-10 | Disposition: A | Discharge status: Home / Self Care | Payer: Medicare Other | Source: Ambulatory Visit | Attending: Surgery | Admitting: Surgery

## 2018-09-10 VITALS — BP 104/72 | HR 70 | Resp 18 | Ht 71.0 in | Wt 245.2 lb

## 2018-09-10 DIAGNOSIS — I712 Thoracic aortic aneurysm, without rupture, unspecified: Secondary | ICD-10-CM

## 2018-09-10 MED ORDER — IOPAMIDOL (ISOVUE-370) INJECTION 76%
75.0000 mL | Freq: Once | INTRAVENOUS | Status: AC | PRN
  Administered 2018-09-10: 75 mL via INTRAVENOUS

## 2018-09-10 NOTE — Progress Notes (Signed)
HPI:  The patient is a 77 year old gentleman with hypertension, hyperlipidemia, atrial fibrillation, OSA on CPAP and coronary artery disease s/p CABG in 2007.  He has a known fusiform ascending aortic aneurysm that was measured at 4.4 cm by MRA of the chest on 09/12/2016.  In 08/2015 it was noted to be 4.3 cm.  He reports that he has been having some chest tightness and shortness of breath occurring with exertion and sometimes at rest.  He saw Dr. Domenic Polite in August 2019 and then had a cardiology evaluation by Dr. Einar Gip.  He thinks had a follow-up cardiac catheterization and echocardiogram are in the future plans.  He is also being evaluated by Dr. Halford Chessman from pulmonary medicine.  Current Outpatient Medications  Medication Sig Dispense Refill  . albuterol (PROVENTIL HFA;VENTOLIN HFA) 108 (90 Base) MCG/ACT inhaler Inhale 2 puffs into the lungs every 4 (four) hours as needed for wheezing or shortness of breath. 1 Inhaler 5  . aspirin EC 81 MG tablet Take 1 tablet (81 mg total) by mouth daily. 30 tablet 3  . atorvastatin (LIPITOR) 20 MG tablet   5  . furosemide (LASIX) 20 MG tablet Take 1 tablet (20 mg total) by mouth daily. 30 tablet 3  . gabapentin (NEURONTIN) 100 MG capsule Take 100 mg by mouth 2 (two) times daily.    . Garlic 7494 MG CAPS Take 1,000 mg by mouth daily.    Marland Kitchen HYDROcodone-acetaminophen (NORCO/VICODIN) 5-325 MG tablet Take 1 tablet by mouth every 6 (six) hours as needed for moderate pain.    . isosorbide mononitrate (IMDUR) 30 MG 24 hr tablet TAKE 1 TABLET BY MOUTH ONCE DAILY 90 tablet 0  . losartan (COZAAR) 25 MG tablet   5  . metoprolol succinate (TOPROL-XL) 50 MG 24 hr tablet Take 50 mg by mouth 2 (two) times daily. Take with or immediately following a meal.    . Multiple Vitamin (MULTIVITAMIN WITH MINERALS) TABS tablet Take 1 tablet by mouth daily.    . naproxen sodium (ANAPROX) 220 MG tablet Take 220 mg by mouth 2 (two) times daily as needed (FOR PAIN.).     Marland Kitchen nitroGLYCERIN  (NITROSTAT) 0.4 MG SL tablet Place 1 tablet (0.4 mg total) under the tongue every 5 (five) minutes as needed for chest pain. 25 tablet 3  . Omega-3 Fatty Acids (FISH OIL) 1200 MG CAPS Take 1,200 mg by mouth daily.    Marland Kitchen omeprazole (PRILOSEC) 20 MG capsule Take 20 mg by mouth every other day. IN THE MORNING    . potassium chloride (K-DUR) 10 MEQ tablet Take 1 tablet (10 mEq total) by mouth daily. 30 tablet 3  . tamsulosin (FLOMAX) 0.4 MG CAPS capsule Take 0.4 mg by mouth 2 (two) times daily.    Marland Kitchen tiZANidine (ZANAFLEX) 4 MG tablet Take 4 mg by mouth at bedtime as needed for muscle spasms.     No current facility-administered medications for this visit.      Physical Exam: BP 104/72 (BP Location: Right Arm, Patient Position: Sitting, Cuff Size: Normal)   Pulse 70   Resp 18   Ht 5\' 11"  (1.803 m)   Wt 245 lb 3.2 oz (111.2 kg)   SpO2 93% Comment: RA  BMI 34.20 kg/m  He looks well. Cardiac exam shows a regular rate and rhythm with normal heart sounds.  There is no murmur. Lungs are clear. There is no peripheral edema.  Diagnostic Tests:  CLINICAL DATA:  Follow-up aortic aneurysm  EXAM: CT ANGIOGRAPHY CHEST WITH CONTRAST  TECHNIQUE: Multidetector CT imaging of the chest was performed using the standard protocol during bolus administration of intravenous contrast. Multiplanar CT image reconstructions and MIPs were obtained to evaluate the vascular anatomy.  CONTRAST:  12mL ISOVUE-370 IOPAMIDOL (ISOVUE-370) INJECTION 76%  Creatinine was obtained on site at [place] at [address]  Results: Creatinine 1.3 mg/dL.  COMPARISON:  08/12/2015  FINDINGS: Cardiovascular: Heart is mildly enlarged. Prior CABG. Mild aneurysmal dilatation of the ascending thoracic aorta, 4.5 cm maximally, stable. No dissection.  Mediastinum/Nodes: Mildly enlarged and borderline sized mediastinal lymph nodes. Right paratracheal lymph node has a short axis diameter of 12 mm,  stable.  Lungs/Pleura: No confluent opacities or effusions. Calcified granulomas in the right upper lobe.  Upper Abdomen: Imaging into the upper abdomen shows no acute findings. Prior cholecystectomy.  Musculoskeletal: Chest wall soft tissues are unremarkable. No acute bony abnormality.  Review of the MIP images confirms the above findings.  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 Recommend semi-annual imaging followup by CTA or MRA. This recommendation follows 2010 ACCF/AHA/AATS/ACR/ASA/SCA/SCAI/SIR/STS/SVM Guidelines for the Diagnosis and Management of Patients With Thoracic Aortic Disease. Circulation. 2010; 121: B583-E940   Electronically Signed   By: Rolm Baptise M.D.   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  This is well below the surgical threshold of 5.5 cm.  I reviewed the CT scan images with the patient and his wife and answered their questions.  I stressed the importance of good blood pressure control.  I will plan to repeat his scan in about 1 year.  He is going to continue follow-up with Dr. Einar Gip concerning his exertional chest discomfort and shortness of breath and may require cardiac catheterization.  Plan:  I will see him back in 1 year with a CTA of the chest.   I spent 15 minutes performing this established patient evaluation and > 50% of this time was spent face to face counseling and coordinating the care of this patient's aortic aneurysm.    Gaye Pollack, MD [facility] [phone]

## 2018-09-11 ENCOUNTER — Ambulatory Visit: Payer: Medicare Other | Admitting: Emergency Medicine

## 2018-09-11 ENCOUNTER — Encounter: Payer: Self-pay | Admitting: Emergency Medicine

## 2018-09-11 ENCOUNTER — Encounter: Payer: Self-pay | Admitting: Internal Medicine

## 2018-09-11 DIAGNOSIS — R0609 Other forms of dyspnea: Secondary | ICD-10-CM | POA: Diagnosis not present

## 2018-09-11 DIAGNOSIS — G4733 Obstructive sleep apnea (adult) (pediatric): Secondary | ICD-10-CM

## 2018-09-11 DIAGNOSIS — J449 Chronic obstructive pulmonary disease, unspecified: Secondary | ICD-10-CM

## 2018-09-11 MED ORDER — TIOTROPIUM BROMIDE-OLODATEROL 2.5-2.5 MCG/ACT IN AERS: 2.0000 | INHALATION_SPRAY | Freq: Every day | RESPIRATORY_TRACT | 0 refills | Status: DC

## 2018-09-11 NOTE — Progress Notes (Signed)
Subjective:    Patient ID: [name], male    DOB: 1941/11/11, 77 y.o.   MRN: [id number]  Shortness of Breath  Pertinent negatives include no ear pain, fever, headaches, leg swelling, rash, rhinorrhea, sore throat, vomiting or wheezing.   Acute OV 08/08/18 --77 year old gentleman whom I have followed for former tobacco use (45 pack years) and mild obstructive lung disease (2016), obstructive sleep apnea with poor CPAP compliance.  He has a history of coronary artery disease/CABG, atrial fibrillation.  He has been seen in our office over the last 2 months with waxing and waning acute dyspnea.  It was felt that this could be related to his volume status, his prn lasix was converted to 20mg  scheduled, metoprolol was increased.  He was treated for an acute bronchitis by [name] about 2 weeks ago and was treated w abx, cough and mucous improved. He has established with [name] for cardiology, also to follow with [name].   He still has exertional SOB, also can happen at rest. No wheeze, minimal cough.   ROV 09/11/18 --[name] is a former smoker followed for obstructive sleep apnea (not currently on CPAP), mild obstruction on pulmonary function testing.  He also has coronary disease/CABG, atrial fibrillation.  I saw him a month ago with increased dyspnea probably multifactorial.  He was to undergo pulmonary function testing today but he was unable due to dyspnea. I gave him albuterol to use as needed - he tried it 6-8 times, didn't notice much benefit. He has gained about 5 lbs in the last year. He is to see [name] this month. He is following with [name] for AAA. Split night PSG > AHI 62/h. He is willing to retry CPAP, needed CPAP 11 cm H2O    Review of Systems  Constitutional: Positive for fatigue. Negative for fever and unexpected weight change.  HENT: Negative for congestion, dental problem, ear pain, nosebleeds, postnasal drip, rhinorrhea, sinus pressure, sneezing, sore throat and  trouble swallowing.   Eyes: Negative for redness and itching.  Respiratory: Negative for cough, chest tightness, shortness of breath and wheezing.   Cardiovascular: Negative for palpitations and leg swelling.  Gastrointestinal: Negative for nausea and vomiting.  Genitourinary: Negative for dysuria.  Musculoskeletal: Negative for joint swelling and myalgias.  Skin: Negative for rash.  Neurological: Negative for headaches.  Hematological: Does not bruise/bleed easily.  Psychiatric/Behavioral: Negative for dysphoric mood. The patient is not nervous/anxious.    Past Medical History:  Diagnosis Date  . BPH (benign prostatic hyperplasia)   . CAD (coronary artery disease)    s/p CABG 2007; NSTEMI in setting of AFib with RVR 12/2009; cath 1/11: S-RCA ok with prox 30-40% and 50% stenoses; S-OM and RI  with patent OM limb but an occluded RI limb; S-D2 and L-LAD ok; EF 55%  . Colon cancer ([place]) 1992  . Diverticulosis   . Essential hypertension   . GERD (gastroesophageal reflux disease)   . History of pulmonary embolus (PE) 2007   Following CABG  . Hyperlipidemia   . 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 OSA on CPAP   . Paroxysmal atrial fibrillation ([facility])    Previously on Multaq, declines anticoagulation  . Personal history of colonic polyps 01/22/2000   Tubular adenoma  . RBBB (right bundle branch block)      Family History  Problem Relation Age of Onset  . Lung cancer Mother   . Heart disease Mother   . Emphysema  Father   . Heart disease Father   . Colon cancer Sister   . Breast cancer Sister   . Breast cancer Sister      Social History   Socioeconomic History  . Marital status: Married    Spouse name: Not on file  . Number of children: 4  . Years of education: Not on file  . Highest education level: Not on file  Occupational History  . Occupation: Retired    Comment: Chiropractor of [employer]  . Financial resource strain: Not on file  . Food insecurity:     Worry: Not on file    Inability: Not on file  . Transportation needs:    Medical: Not on file    Non-medical: Not on file  Tobacco Use  . Smoking status: Former Smoker    Packs/day: 1.00    Years: 45.00    Pack years: 45.00    Types: Cigarettes    Last attempt to quit: 12/18/1995    Years since quitting: 22.7  . Smokeless tobacco: Never Used  Substance and Sexual Activity  . Alcohol use: No    Comment: quit in 1980  . Drug use: No  . Sexual activity: Not on file  Lifestyle  . Physical activity:    Days per week: Not on file    Minutes per session: Not on file  . Stress: Not on file  Relationships  . Social connections:    Talks on phone: Not on file    Gets together: Not on file    Attends religious service: Not on file    Active member of club or organization: Not on file    Attends meetings of clubs or organizations: Not on file    Relationship status: Not on file  . Intimate partner violence:    Fear of current or ex partner: Not on file    Emotionally abused: Not on file    Physically abused: Not on file    Forced sexual activity: Not on file  Other Topics Concern  . Not on file  Social History Narrative   Daily caffeine   he has been exposed to animals - hogs, mules.    No Known Allergies   Outpatient Medications Prior to Visit  Medication Sig Dispense Refill  . albuterol (PROVENTIL HFA;VENTOLIN HFA) 108 (90 Base) MCG/ACT inhaler Inhale 2 puffs into the lungs every 4 (four) hours as needed for wheezing or shortness of breath. 1 Inhaler 5  . aspirin EC 81 MG tablet Take 1 tablet (81 mg total) by mouth daily. 30 tablet 3  . atorvastatin (LIPITOR) 20 MG tablet   5  . furosemide (LASIX) 20 MG tablet Take 1 tablet (20 mg total) by mouth daily. 30 tablet 3  . gabapentin (NEURONTIN) 100 MG capsule Take 100 mg by mouth 2 (two) times daily.    . Garlic 2956 MG CAPS Take 1,000 mg by mouth daily.    Marland Kitchen HYDROcodone-acetaminophen (NORCO/VICODIN) 5-325 MG tablet Take 1  tablet by mouth every 6 (six) hours as needed for moderate pain.    . isosorbide mononitrate (IMDUR) 30 MG 24 hr tablet TAKE 1 TABLET BY MOUTH ONCE DAILY 90 tablet 0  . losartan (COZAAR) 25 MG tablet   5  . metoprolol succinate (TOPROL-XL) 50 MG 24 hr tablet Take 50 mg by mouth 2 (two) times daily. Take with or immediately following a meal.    . Multiple Vitamin (MULTIVITAMIN WITH MINERALS) TABS tablet Take  1 tablet by mouth daily.    . naproxen sodium (ANAPROX) 220 MG tablet Take 220 mg by mouth 2 (two) times daily as needed (FOR PAIN.).     Marland Kitchen nitroGLYCERIN (NITROSTAT) 0.4 MG SL tablet Place 1 tablet (0.4 mg total) under the tongue every 5 (five) minutes as needed for chest pain. 25 tablet 3  . Omega-3 Fatty Acids (FISH OIL) 1200 MG CAPS Take 1,200 mg by mouth daily.    Marland Kitchen omeprazole (PRILOSEC) 20 MG capsule Take 20 mg by mouth every other day. IN THE MORNING    . potassium chloride (K-DUR) 10 MEQ tablet Take 1 tablet (10 mEq total) by mouth daily. 30 tablet 3  . tamsulosin (FLOMAX) 0.4 MG CAPS capsule Take 0.4 mg by mouth 2 (two) times daily.    Marland Kitchen tiZANidine (ZANAFLEX) 4 MG tablet Take 4 mg by mouth at bedtime as needed for muscle spasms.     No facility-administered medications prior to visit.          Objective:   Physical Exam Vitals:   09/11/18 1113  BP: 130/78  Pulse: 74  SpO2: 97%  Weight: 246 lb (111.6 kg)  Height: 6' (1.829 m)   Gen: Pleasant, well-nourished, in no distress,  normal affect  ENT: No lesions,  mouth clear,  oropharynx clear, no postnasal drip  Neck: No JVD, no stridor  Lungs: No use of accessory muscles, no wheeze, decreased at bases  Cardiovascular:  Regular, no evidence a fib currently   Musculoskeletal: amputation R index finger, no cyanosis or clubbing  Neuro: alert, non focal  Skin: Warm, no lesions or rashes     Assessment & Plan:  Obstructive sleep apnea Confirmed on his recent split-night study, AHI 62.5/h.  His old CPAP is not  functioning, is over 1 years old.  He needs a new device with Medium size Resmed Nasal CPAP Mask AirFit N30i mask and heated humidification.  I will follow him in about 6 weeks for compliance  COPD (chronic obstructive pulmonary disease) ([place]) He was unable to do his PFT today so I cannot quantify his degree of obstruction.  I think based on his inability to do so we should do a trial of empiric to see if he benefits.  Dyspnea on exertion Etiology remains unclear.  He did not get a clinical response to albuterol and he is high risk for recurrence of symptoms due to his coronary disease.  I suspect that when he follows with Samuel. Einar Bennett further work-up is going to take place including possible stress test or catheterization.  In the meantime I will try him on Stiolto to see if he gets benefit.  He is seeing Samuel. Einar Bennett on 9/30, may not be able to say completely whether he is benefiting since it can take longer to establish.   Baltazar Apo, MD, PhD 09/11/2018, 11:57 AM [facility] [phone] or if no answer [phone]

## 2018-09-11 NOTE — Assessment & Plan Note (Signed)
Confirmed on his recent split-night study, AHI 62.5/h.  His old CPAP is not functioning, is over 78 years old.  He needs a new device with Medium size Resmed Nasal CPAP Mask AirFit N30i mask and heated humidification.  I will follow him in about 6 weeks for compliance

## 2018-09-11 NOTE — Progress Notes (Signed)
Patient seen in the office today and instructed on use of Stiolto.  Patient expressed understanding and demonstrated technique. 

## 2018-09-11 NOTE — Assessment & Plan Note (Signed)
Etiology remains unclear.  He did not get a clinical response to albuterol and he is high risk for recurrence of symptoms due to his coronary disease.  I suspect that when he follows with Dr. Einar Gip further work-up is going to take place including possible stress test or catheterization.  In the meantime I will try him on Stiolto to see if he gets benefit.  He is seeing Dr. Einar Gip on 9/30, may not be able to say completely whether he is benefiting since it can take longer to establish.

## 2018-09-11 NOTE — Assessment & Plan Note (Signed)
He was unable to do his PFT today so I cannot quantify his degree of obstruction.  I think based on his inability to do so we should do a trial of empiric to see if he benefits.

## 2018-09-11 NOTE — Addendum Note (Signed)
Addended by: Desmond Dike C on: 09/11/2018 12:04 PM   Modules accepted: Orders

## 2018-09-11 NOTE — Patient Instructions (Signed)
We will do a trial of Stiolto 2 puffs once daily until our next visit. Keep albuterol available to use 2 puffs if needed for shortness of breath Follow with Dr. Einar Gip as planned. Based on your recent sleep test we need to restart CPAP.  We will order a new device for you with a pressure of 11 cmH2O Follow with Dr Lamonte Sakai in 6 weeks or sooner if you have any problems

## 2018-10-03 NOTE — H&P (Signed)
OFFICE VISIT NOTES COPIED TO EPIC FOR DOCUMENTATION  . History of Present Illness (April Harrington; 10-02-18 2:37 PM) Patient words: 6 week f/u EKG for cad, afib; Last office visit 07/31/18.  The patient is a 77 year old male who presents for a Follow-up for Shortness of breath.  Additional reasons for visit:  Follow-up for Coronary artery disease is described as the following: [name] is a Caucasian male with known coronary artery disease and CABG in 2007 in the setting of non-STEMI in atrial fibrillation with RVR. Coronary angiogram 07/02/2017 revealed LIMA to LAD, SVG to D2, SVG to OM1 and ramus intermediate with OM1 limb occluded, SVG to RCA with a proximal 50% stenosis. Normal LVEF. Normal right heart pressure.  His past medical history is significant for 45 pack year h/o smoking tobacco, ascending aortic aneurysm (follows Dr.Bryan Bartle), small abdominal aortic aneurysm noted in 2015 (3cm), H/O colon cancer, in remission since 1992, GERD, hyperlipidemia, obstructive sleep apnea not using CPAP and h/o A. Fib when he presented with NSTEMI in 2007 with no documented recurrence and he did not want to be on anticoagulation.  He now presents here to establish cardiac care, has chronic shortness of breath and dyspnea on exertion but states that it has been getting worse, unable to do routine activities without having to stop. His activity is very limited. Denies claudication, TIA, chest pain.   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 Normal LVEF. Lexiscan sestamibi stress test 12/03/2016: No significant ST-T wave changes during stress test. Medium defect of moderate intensity in the basal inferior, mid inferoseptal, mid inferior and mid inferolateral and apical inferior and apical lateral location consistent with Myocardial infarction mild degree of peri-infarct ischemia. LVEF 52%. Intermediate risk study. H/O five vessel coronary artery bypass (Z95.1)  CABG 2007: LIMA to LAD, SVG to D2, SVG to OM1 and ramus intermediate with OM1 limb occluded, SVG to RCA with a proximal 50% stenosis by coronary angiography in July 2018. Normal LVEF. Benign essential hypertension (I10)  Obstructive sleep apnea, adult (G47.33)  Not using CPAP. Follows Dr. Lamonte Sakai, Herbie Baltimore ([facility]): Dyspnea on exertion (R06.09)  Echocardiogram 03/19/2017: Normal LV systolic function, severe LVH, EF 12-75%, grade 1 diastolic dysfunction. Right ventricle mildly dilated. RV function normal. No obvious PFO. Chest x-ray PA and lateral view 07/22/2018: Mild cardiomegaly, post CABG with pulmonary vascular congestion Laboratory examination (Z01.89)  Labs 06/03/2018: Potassium 3.9, BUN 13, creatinine 1.16, eGFR 64 mL, d-dimer 0.43, HB 14.6/HCT 43.7, platelets 180. 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 CT angiogram for 08/11/2018 ordered (Dr. Gilford Raid, MD): Stable at 4.5 will continue annual screening Abdominal aortic aneurysm 30 to 34 mm in diameter (I71.4)  Ultrasound of the abdomen 07/09/2014: Abdominal atherosclerotic change and ectasia, maximum diameter 3.6 cm proximal aorta.  Allergies (April Harrington; Oct 02, 2018 2:36 PM) No Known Drug Allergies [07/31/2018]:  Family History (April Harrington; 10-02-18 2:36 PM) Mother  Deceased. age 11 from lung cancer, heart issues, htn, no strokes Father  Deceased. age 23 from heart  attack, emphezema, no strokes Sister 3  1 deceased, 1 ould 1 younger still living- cancer, no heart attacks or strokes, no known cardiovascular conditions  Social History (April Harrington; 09/15/2018 2:36 PM) Current tobacco use  quit 1997, smoked for 40-50 yrs ago @ 1ppd Marital status  Married. Non Drinker/No Alcohol Use  Living Situation  Lives with spouse. Number of Children  4.  Past Surgical History (April Harrington; 09/15/2018 2:36 PM) Colon Removal - Partial [1992]: from colon cancer Hernia Repair  (4) repairs Gallbladder Surgery [2006]: Coronary Artery Bypass, Five [2007]: 2007: LIMA to LAD, SVG to D2, SVG to OM1 and ramus intermediate with OM1 limb occluded, SVG to RCA with a proximal 50% stenosis by coronary angiography in July 2018. Normal LVEF.  Medication History Laverda Page, MD; 09/15/2018 10:19 PM) Metoprolol Succinate ER ('50MG'$  Tablet ER 24HR, 1 Tablet Oral two times daily, Taken starting 07/31/2018) Active. Omega-3 & Omega-6 Fish Oil ('2080mg'$  Oral daily) Active. Multivitamin Men 50+ (1 Oral daily) Active. Potassium Chloride ER (10MEQ Capsule ER, 1 Oral daily) Active. clonazePAM ('1MG'$  Tablet, Oral prn sleep) Active. Gabapentin ('100MG'$  Capsule, 1 Oral two times daily) Active. Isosorbide Mononitrate ER ('30MG'$  Tablet ER 24HR, 1 Oral daily) Active. tiZANidine HCl ('4MG'$  Tablet, Oral prn bedtime) Active. HYDROcodone-Acetaminophen (5-'325MG'$  Tablet, Oral prn pain) Active. Furosemide ('20MG'$  Tablet, 1 tab Oral daily) Active. Omeprazole ('20MG'$  Capsule DR, 1 Oral qod) Active. Tamsulosin HCl (0.'4MG'$  Capsule, 2 caps Oral daily) Active. Atorvastatin Calcium ('20MG'$  Tablet, 1tab Oral daily) Active. Losartan Potassium ('25MG'$  Tablet, 1 Oral daily) Active. Claritin ('10MG'$  Tablet, 1 Oral qod) Active. Garlic ('1000MG'$  Capsule, 1 Oral daily) Active. Ketoconazole (2% Cream, External prn) Active. Aspirin ('81MG'$  Tablet Chewable, 1 Oral daily) Active. ProAir HFA (108 (90  Base)MCG/ACT Aerosol Soln, Inhalation daily) Active. Medications Reconciled (verbally with pt; no list or medication present)  Diagnostic Studies History (April Harrington; 09/15/2018 2:44 PM) Echocardiogram [2018]: Echocardiogram 03/19/2017: Normal LV systolic function, severe LVH, EF 23-76%, grade 1 diastolic dysfunction. Right ventricle mildly dilated. RV function normal. No obvious PFO. Coronary Angiogram  07/03/2017: Normal right heart pressure. Coronary angiogram LIMA to LAD, SVG to D2, SVG to OM1 and ramus intermediate with OM1 limb occluded, SVG to RCA with a proximal 50% stenosis by coronary angiography in July 2018. Normal LVEF. Nuclear stress test  Lexiscan sestamibi stress test 12/03/2016: No significant ST-T wave changes during stress test. Medium defect of moderate intensity in the basal inferior, mid inferoseptal, mid inferior and mid inferolateral and apical inferior and apical lateral location consistent with Myocardial infarction mild degree of peri-infarct ischemia. LVEF 52%. Intermediate risk study. Doppler Ultrasound  Ultrasound of the abdomen 07/09/2014: Abdominal atherosclerotic change and ectasia, maximum diameter 3.6 cm proximal aorta.    Review of Systems Laverda Page MD; 09/15/2018 10:21 PM) General Not Present- Appetite Loss and Weight Gain. Respiratory Present- Decreased Exercise Tolerance and Difficulty Breathing on Exertion. Not Present- Chronic Cough, Sputum Production, Wakes up from Sleep Wheezing or Short of Breath and Wheezing. Cardiovascular Present- Chest Pain and Palpitations. Not Present- Calf Cramps, Claudications, Edema and Paroxysmal Nocturnal Dyspnea. Gastrointestinal Not Present- Black, Tarry Stool and Difficulty Swallowing. Musculoskeletal Present- Back Pain and Joint Pain (knee). Not Present- Decreased Range of Motion and Muscle Atrophy. Neurological Not Present- Attention Deficit. Psychiatric Not Present- Personality Changes and Suicidal  Ideation. Endocrine Not Present- Cold Intolerance and Heat Intolerance. Hematology Not Present- Abnormal Bleeding. All other systems negative  Vitals (April Harrington; 09/15/2018 2:51 PM) 09/15/2018 2:41  PM Weight: 245.5 lb Height: 71in Body Surface Area: 2.3 m Body Mass Index: 34.24 kg/m  Pulse: 80 (Regular)  P.OX: 94% (Room air) BP: 110/72 (Sitting, Right Arm, Standard)       Physical Exam Laverda Page, MD; 09/15/2018 10:15 PM) General Mental Status-Alert. General Appearance-Cooperative and Appears stated age. Build & Nutrition-Well built and Mildly obese.  Head and Neck Thyroid Gland Characteristics - normal size and consistency and no palpable nodules.  Chest and Lung Exam Chest and lung exam reveals -quiet, even and easy respiratory effort with no use of accessory muscles, non-tender and on auscultation, normal breath sounds, no adventitious sounds.  Cardiovascular Cardiovascular examination reveals -normal heart sounds, regular rate and rhythm with no murmurs, carotid auscultation reveals no bruits and abdominal aorta auscultation reveals no bruits and no prominent pulsation. Auscultation Rhythm - Frequent Ectopy.  Abdomen Inspection Contour - Obese. Palpation/Percussion Normal exam - Non Tender and No hepatosplenomegaly.  Peripheral Vascular Lower Extremity Inspection - Bilateral - Inspection Normal. Palpation - Temperature - Bilateral - Normal. Edema - Bilateral - 2+ Pitting edema(at the ankle only). Femoral pulse - Bilateral - 2+. Popliteal pulse - Bilateral - Feeble(Pulse difficult to feel due to patient's bodily habitus.). Dorsalis pedis pulse - Bilateral - Feeble. Posterior tibial pulse - Bilateral - Normal. Carotid arteries - Bilateral-No Carotid bruit.  Neurologic Neurologic evaluation reveals -alert and oriented x 3 with no impairment of recent or remote memory. Motor-Grossly intact without any focal  deficits.  Musculoskeletal Global Assessment Left Lower Extremity - no deformities, masses or tenderness, no known fractures. Right Lower Extremity - no deformities, masses or tenderness, no known fractures.  Assessment & Plan Laverda Page MD; 09/15/2018 10:23 PM) Dyspnea on exertion (R06.09) Story:Echocardiogram 09/26/2018: Left ventricle cavity is normal in size. Moderate concentric hypertrophy of the left ventricle. Abnormal septal wall motion due to post-operative coronary artery bypass graft. Doppler evidence of grade I (impaired) diastolic dysfunction, normal LAP. Calculated EF 56%. Left atrial cavity is normal in size. The interatrial Septum is thin and mobile but appears to be intact by 2D and CF Doppler interrogation. The aortic root is mildly dilated at 4.11 cm. Compared to Echocardiogram 03/19/2017: Normal LV systolic function, severe LVH, EF 28-31%, grade 1 diastolic dysfunction.  Right ventricle mildly dilated.     Chest x-ray PA and lateral view 07/22/2018: Mild cardiomegaly, post CABG with pulmonary vascular congestion Coronary artery disease of native artery of native heart with stable angina pectoris (I25.118) Story: Coronary angiogram 07/02/2017: (CABG 2007): LIMA to LAD, SVG to D2, SVG to OM1 and ramus intermediate & OM1, SVG to RCA with a proximal and distal 50% stenosis by coronary angiography in 07/02/2017. D1 70% and D2 100% occluded. Normal LVEF. Native Cx 90%.  Lexiscan sestamibi stress test 12/03/2016: No significant ST-T wave changes during stress test. Medium defect of moderate intensity in the basal inferior, mid inferoseptal, mid inferior and mid inferolateral and apical inferior and apical lateral location consistent with Myocardial infarction mild degree of peri-infarct ischemia. LVEF 52%. Intermediate risk study.  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 Normal LVEF. Benign essential hypertension (I10) Story: EKG 09/15/2018: Normal sinus rhythm at the rate of 73 bpm, normal axis, cannot exclude inferior infarct old. Right bundle branch block. PVC.  Obstructive sleep apnea, adult (G47.33)  Story: Not using CPAP. Follows Dr. Lamonte Sakai, Herbie [place] ([facility]): Thoracic ascending aortic aneurysm (I71.2) Story: Mild aneurysmal dilatation of the ascending thoracic aorta measuring 4.6 cm. CT angiogram for 08/11/2018 ordered (Dr. Gilford Raid, MD): Stable at 4.5 will continue annual screening Abdominal aortic aneurysm 30 to 34 mm in diameter (I71.4) Story: Ultrasound of the abdomen 07/09/2014: Abdominal atherosclerotic change and ectasia, maximum diameter 3.6 cm proximal aorta. Laboratory examination (Z01.89) 09/29/2018: Creatinine 1.16, EGFR 60/69, potassium 4.3, glucose 17, BMP otherwise normal.  CBC normal. Labs 06/03/2018: Potassium 3.9, BUN 13, creatinine 1.16, eGFR 64 mL, d-dimer 0.43, HB 14.6/HCT 43.7, platelets 180.  Note:-  Recommendations:  [name] is a Caucasian male with known coronary artery disease and CABG in 2007 in the setting of non-STEMI in atrial fibrillation with RVR. Coronary angiogram 07/02/2017 revealed LIMA to LAD, SVG to D2, SVG to OM1 and ramus intermediate. RI is severely diffusely disease. SVG to RCA with a proximal and distal 50% stenosis. Normal LVEF. Normal right heart pressure.  His past medical history is significant for 45 pack year h/o smoking tobacco, ascending aortic aneurysm (follows Dr. Gilford Raid and is stable as of 08/2018), small abdominal aortic aneurysm noted in 2015 (3cm), H/O colon cancer, in remission since 1992, GERD, hyperlipidemia, moderate obesity and obstructive sleep apnea not using CPAP but is awaiting new CPAP machine after recent testing and h/o A. Fib when he presented with NSTEMI in 2007 with no documented  recurrence and he did not want to be on anticoagulation.  He has chronic shortness of breath and dyspnea on exertion but states that it has been getting worse, unable to do routine activities without having to stop. Has occasional chest pain and palpitations with dyspnea.  He was seen by Dr. Baltazar Apo on 09/09/2018, he could not complete his PFTs and he suspect he may have mild COPD, he also underwent sleep study which was markedly abnormal, awaiting a new machine.  He was also evaluated by Dr. Cyndia Bent for ascending aortic aneurysm. Due to his worsening dyspnea and also occasional episodes of chest pain. It is felt that cardiac catheterization would be more appropriate.  Suspect dyspnea multifactorial including obesity hypoventilation, COPD, diastolic dysfunction and CAD. Dyspnea class III is his anginal equivalent. I'll set him up for an echocardiogram to reevaluate his LV systolic function, I'll also set him up for left heart catheterization and make further recommendation. Previously he has had positive stress test and hence will not give more information than by directly going for LCP.  He will need femoral arterial access for angiography due to moderate sized ascending aortic aneurysm which will make it difficult for radial access to engage the grafts. All these were explained to the patient and his wife at the bedside, I'll see him back after the test. This was a greater than 40 minute office visit with greater than 50% of the time spent with face-to-face encounter with patient and evaluation of complex medical issues, review of external records and coordination of care. His wife also had multiple questions and all questions answered. Schedule for cardiac catheterization, and possible angioplasty. We discussed regarding risks, benefits, alternatives to this including stress testing, CTA and continued medical therapy. Patient wants to proceed. Understands <1-2% risk of death, stroke, MI, urgent  CABG, bleeding, infection, renal failure but not limited to these.  Cc: Shirline Frees, MD (PCP); Baltazar Apo, MD (Pul) Addendum Sunday Spillers MD; 09/28/2018 5:14 PM) Echocardiogram 09/26/2018: Left ventricle cavity is normal in size. Moderate  concentric hypertrophy of the left ventricle. Abnormal septal wall motion due to post-operative coronary artery bypass graft. Doppler evidence of grade I (impaired) diastolic dysfunction, normal LAP. Calculated EF 56%. Left atrial cavity is normal in size. The interatrial Septum is thin and mobile but appears to be intact by 2D and CF Doppler interrogation. The aortic root is mildly dilated at 4.11 cm.  Signed by Laverda Page, MD (09/15/2018 10:24 PM)

## 2018-10-07 ENCOUNTER — Ambulatory Visit (HOSPITAL_COMMUNITY)
Admission: RE | Admit: 2018-10-07 | Discharge: 2018-10-08 | Disposition: A | Discharge status: Home / Self Care | Payer: Medicare Other | Source: Ambulatory Visit | Attending: Cardiology | Admitting: Cardiology

## 2018-10-07 ENCOUNTER — Encounter (HOSPITAL_COMMUNITY)
Admission: RE | Disposition: A | Discharge status: Home / Self Care | Payer: Self-pay | Source: Ambulatory Visit | Attending: Cardiology

## 2018-10-07 ENCOUNTER — Other Ambulatory Visit: Payer: Self-pay

## 2018-10-07 DIAGNOSIS — Z9221 Personal history of antineoplastic chemotherapy: Secondary | ICD-10-CM | POA: Insufficient documentation

## 2018-10-07 DIAGNOSIS — Z8249 Family history of ischemic heart disease and other diseases of the circulatory system: Secondary | ICD-10-CM | POA: Diagnosis not present

## 2018-10-07 DIAGNOSIS — I2510 Atherosclerotic heart disease of native coronary artery without angina pectoris: Secondary | ICD-10-CM | POA: Diagnosis not present

## 2018-10-07 DIAGNOSIS — Z85038 Personal history of other malignant neoplasm of large intestine: Secondary | ICD-10-CM | POA: Insufficient documentation

## 2018-10-07 DIAGNOSIS — Z7982 Long term (current) use of aspirin: Secondary | ICD-10-CM | POA: Diagnosis not present

## 2018-10-07 DIAGNOSIS — I712 Thoracic aortic aneurysm, without rupture: Secondary | ICD-10-CM | POA: Diagnosis not present

## 2018-10-07 DIAGNOSIS — I10 Essential (primary) hypertension: Secondary | ICD-10-CM | POA: Insufficient documentation

## 2018-10-07 DIAGNOSIS — R9439 Abnormal result of other cardiovascular function study: Secondary | ICD-10-CM | POA: Diagnosis not present

## 2018-10-07 DIAGNOSIS — Z87891 Personal history of nicotine dependence: Secondary | ICD-10-CM | POA: Insufficient documentation

## 2018-10-07 DIAGNOSIS — Z9861 Coronary angioplasty status: Secondary | ICD-10-CM

## 2018-10-07 DIAGNOSIS — Z951 Presence of aortocoronary bypass graft: Secondary | ICD-10-CM | POA: Insufficient documentation

## 2018-10-07 DIAGNOSIS — R0609 Other forms of dyspnea: Secondary | ICD-10-CM | POA: Insufficient documentation

## 2018-10-07 DIAGNOSIS — Z006 Encounter for examination for normal comparison and control in clinical research program: Secondary | ICD-10-CM

## 2018-10-07 DIAGNOSIS — G4733 Obstructive sleep apnea (adult) (pediatric): Secondary | ICD-10-CM | POA: Insufficient documentation

## 2018-10-07 DIAGNOSIS — K219 Gastro-esophageal reflux disease without esophagitis: Secondary | ICD-10-CM | POA: Insufficient documentation

## 2018-10-07 DIAGNOSIS — I714 Abdominal aortic aneurysm, without rupture: Secondary | ICD-10-CM | POA: Diagnosis not present

## 2018-10-07 DIAGNOSIS — G629 Polyneuropathy, unspecified: Secondary | ICD-10-CM | POA: Insufficient documentation

## 2018-10-07 DIAGNOSIS — Z9889 Other specified postprocedural states: Secondary | ICD-10-CM | POA: Insufficient documentation

## 2018-10-07 DIAGNOSIS — Z9049 Acquired absence of other specified parts of digestive tract: Secondary | ICD-10-CM | POA: Diagnosis not present

## 2018-10-07 DIAGNOSIS — M1990 Unspecified osteoarthritis, unspecified site: Secondary | ICD-10-CM | POA: Diagnosis not present

## 2018-10-07 DIAGNOSIS — Z955 Presence of coronary angioplasty implant and graft: Secondary | ICD-10-CM

## 2018-10-07 HISTORY — PX: CORONARY STENT INTERVENTION: CATH118234

## 2018-10-07 HISTORY — PX: RIGHT/LEFT HEART CATH AND CORONARY/GRAFT ANGIOGRAPHY: CATH118267

## 2018-10-07 LAB — POCT I-STAT 3, VENOUS BLOOD GAS (G3P V)
ACID-BASE EXCESS: 1 mmol/L (ref 0.0–2.0)
Bicarbonate: 26.9 mmol/L (ref 20.0–28.0)
O2 SAT: 75 %
PO2 VEN: 42 mmHg (ref 32.0–45.0)
TCO2: 28 mmol/L (ref 22–32)
pCO2, Ven: 48.4 mmHg (ref 44.0–60.0)
pH, Ven: 7.353 (ref 7.250–7.430)

## 2018-10-07 LAB — POCT I-STAT 3, ART BLOOD GAS (G3+)
Bicarbonate: 24.6 mmol/L (ref 20.0–28.0)
O2 SAT: 95 %
PCO2 ART: 39.4 mmHg (ref 32.0–48.0)
PH ART: 7.404 (ref 7.350–7.450)
PO2 ART: 76 mmHg — AB (ref 83.0–108.0)
TCO2: 26 mmol/L (ref 22–32)

## 2018-10-07 LAB — POCT ACTIVATED CLOTTING TIME
ACTIVATED CLOTTING TIME: 213 s
ACTIVATED CLOTTING TIME: 296 s

## 2018-10-07 SURGERY — RIGHT/LEFT HEART CATH AND CORONARY/GRAFT ANGIOGRAPHY
Anesthesia: LOCAL

## 2018-10-07 MED ORDER — HEPARIN (PORCINE) IN NACL 1000-0.9 UT/500ML-% IV SOLN
INTRAVENOUS | Status: DC | PRN
  Administered 2018-10-07 (×2): 500 mL

## 2018-10-07 MED ORDER — HEPARIN (PORCINE) IN NACL 1000-0.9 UT/500ML-% IV SOLN
INTRAVENOUS | Status: AC
  Filled 2018-10-07: qty 1000

## 2018-10-07 MED ORDER — ALBUTEROL SULFATE (2.5 MG/3ML) 0.083% IN NEBU: 2.5000 mg | INHALATION_SOLUTION | RESPIRATORY_TRACT | Status: DC | PRN

## 2018-10-07 MED ORDER — NITROGLYCERIN 1 MG/10 ML FOR IR/CATH LAB
INTRA_ARTERIAL | Status: AC
  Filled 2018-10-07: qty 10

## 2018-10-07 MED ORDER — NITROGLYCERIN 1 MG/10 ML FOR IR/CATH LAB
INTRA_ARTERIAL | Status: DC | PRN
  Administered 2018-10-07: 200 ug via INTRACORONARY

## 2018-10-07 MED ORDER — SODIUM CHLORIDE 0.9% FLUSH: 3.0000 mL | INTRAVENOUS | Status: DC | PRN

## 2018-10-07 MED ORDER — METOPROLOL SUCCINATE ER 50 MG PO TB24
50.0000 mg | ORAL_TABLET | Freq: Two times a day (BID) | ORAL | Status: DC
  Administered 2018-10-07 – 2018-10-08 (×2): 50 mg via ORAL
  Filled 2018-10-07 (×2): qty 1

## 2018-10-07 MED ORDER — ASPIRIN EC 81 MG PO TBEC
81.0000 mg | DELAYED_RELEASE_TABLET | Freq: Every day | ORAL | Status: DC
  Administered 2018-10-08: 10:00:00 81 mg via ORAL
  Filled 2018-10-07: qty 1

## 2018-10-07 MED ORDER — IOHEXOL 350 MG/ML SOLN
INTRAVENOUS | Status: DC | PRN
  Administered 2018-10-07: 200 mL via INTRA_ARTERIAL

## 2018-10-07 MED ORDER — SODIUM CHLORIDE 0.9 % IV SOLN
INTRAVENOUS | Status: AC | PRN
  Administered 2018-10-07: 250 mL via INTRAVENOUS

## 2018-10-07 MED ORDER — TAMSULOSIN HCL 0.4 MG PO CAPS
0.8000 mg | ORAL_CAPSULE | Freq: Every evening | ORAL | Status: DC
  Administered 2018-10-07: 0.8 mg via ORAL
  Filled 2018-10-07: qty 2

## 2018-10-07 MED ORDER — GABAPENTIN 100 MG PO CAPS
100.0000 mg | ORAL_CAPSULE | Freq: Two times a day (BID) | ORAL | Status: DC
  Administered 2018-10-07 – 2018-10-08 (×2): 100 mg via ORAL
  Filled 2018-10-07 (×2): qty 1

## 2018-10-07 MED ORDER — SODIUM CHLORIDE 0.9 % IV SOLN
INTRAVENOUS | Status: AC
  Administered 2018-10-07: 14:00:00 via INTRAVENOUS

## 2018-10-07 MED ORDER — HEPARIN SODIUM (PORCINE) 1000 UNIT/ML IJ SOLN
INTRAMUSCULAR | Status: DC | PRN
  Administered 2018-10-07: 8000 [IU] via INTRAVENOUS
  Administered 2018-10-07: 4000 [IU] via INTRAVENOUS

## 2018-10-07 MED ORDER — MIDAZOLAM HCL 2 MG/2ML IJ SOLN
INTRAMUSCULAR | Status: AC
  Filled 2018-10-07: qty 2

## 2018-10-07 MED ORDER — HYDROCODONE-ACETAMINOPHEN 5-325 MG PO TABS: 1.0000 | ORAL_TABLET | Freq: Four times a day (QID) | ORAL | Status: DC | PRN

## 2018-10-07 MED ORDER — MIDAZOLAM HCL 2 MG/2ML IJ SOLN
INTRAMUSCULAR | Status: DC | PRN
  Administered 2018-10-07 (×2): 2 mg via INTRAVENOUS

## 2018-10-07 MED ORDER — ACETAMINOPHEN 325 MG PO TABS: 650.0000 mg | ORAL_TABLET | ORAL | Status: DC | PRN

## 2018-10-07 MED ORDER — CLOPIDOGREL BISULFATE 300 MG PO TABS
ORAL_TABLET | ORAL | Status: AC
  Filled 2018-10-07: qty 2

## 2018-10-07 MED ORDER — GARLIC 1000 MG PO CAPS: 1000.0000 mg | ORAL_CAPSULE | Freq: Every day | ORAL | Status: DC

## 2018-10-07 MED ORDER — TIZANIDINE HCL 4 MG PO TABS
4.0000 mg | ORAL_TABLET | Freq: Every evening | ORAL | Status: DC | PRN
  Filled 2018-10-07: qty 1

## 2018-10-07 MED ORDER — LIDOCAINE HCL (PF) 1 % IJ SOLN
INTRAMUSCULAR | Status: AC
  Filled 2018-10-07: qty 30

## 2018-10-07 MED ORDER — SODIUM CHLORIDE 0.9% FLUSH
3.0000 mL | Freq: Two times a day (BID) | INTRAVENOUS | Status: DC
  Administered 2018-10-07: 3 mL via INTRAVENOUS

## 2018-10-07 MED ORDER — CLOPIDOGREL BISULFATE 300 MG PO TABS
ORAL_TABLET | ORAL | Status: DC | PRN
  Administered 2018-10-07: 600 mg via ORAL

## 2018-10-07 MED ORDER — ONDANSETRON HCL 4 MG/2ML IJ SOLN: 4.0000 mg | Freq: Four times a day (QID) | INTRAMUSCULAR | Status: DC | PRN

## 2018-10-07 MED ORDER — HYDRALAZINE HCL 20 MG/ML IJ SOLN: 5.0000 mg | INTRAMUSCULAR | Status: AC | PRN

## 2018-10-07 MED ORDER — CLOPIDOGREL BISULFATE 75 MG PO TABS
75.0000 mg | ORAL_TABLET | Freq: Every day | ORAL | Status: DC
  Administered 2018-10-08: 10:00:00 75 mg via ORAL
  Filled 2018-10-07: qty 1

## 2018-10-07 MED ORDER — SODIUM CHLORIDE 0.9 % IV SOLN: 250.0000 mL | INTRAVENOUS | Status: DC | PRN

## 2018-10-07 MED ORDER — ONDANSETRON HCL 4 MG/2ML IJ SOLN
INTRAMUSCULAR | Status: DC | PRN
  Administered 2018-10-07: 4 mg via INTRAVENOUS

## 2018-10-07 MED ORDER — ISOSORBIDE MONONITRATE ER 30 MG PO TB24
30.0000 mg | ORAL_TABLET | Freq: Every day | ORAL | Status: DC
  Administered 2018-10-08: 10:00:00 30 mg via ORAL
  Filled 2018-10-07: qty 1

## 2018-10-07 MED ORDER — SODIUM CHLORIDE 0.9% FLUSH: 3.0000 mL | Freq: Two times a day (BID) | INTRAVENOUS | Status: DC

## 2018-10-07 MED ORDER — SODIUM CHLORIDE 0.9 % WEIGHT BASED INFUSION: 1.0000 mL/kg/h | INTRAVENOUS | Status: DC

## 2018-10-07 MED ORDER — FENTANYL CITRATE (PF) 100 MCG/2ML IJ SOLN
INTRAMUSCULAR | Status: AC
  Filled 2018-10-07: qty 2

## 2018-10-07 MED ORDER — ONDANSETRON HCL 4 MG/2ML IJ SOLN
INTRAMUSCULAR | Status: AC
  Filled 2018-10-07: qty 2

## 2018-10-07 MED ORDER — SODIUM CHLORIDE 0.9 % WEIGHT BASED INFUSION
3.0000 mL/kg/h | INTRAVENOUS | Status: DC
  Administered 2018-10-07: 3 mL/kg/h via INTRAVENOUS

## 2018-10-07 MED ORDER — LABETALOL HCL 5 MG/ML IV SOLN: 10.0000 mg | INTRAVENOUS | Status: AC | PRN

## 2018-10-07 MED ORDER — LOSARTAN POTASSIUM 25 MG PO TABS: 25.0000 mg | ORAL_TABLET | Freq: Every evening | ORAL | Status: DC

## 2018-10-07 MED ORDER — ATORVASTATIN CALCIUM 20 MG PO TABS
20.0000 mg | ORAL_TABLET | Freq: Every day | ORAL | Status: DC
  Administered 2018-10-07: 21:00:00 20 mg via ORAL
  Filled 2018-10-07: qty 1

## 2018-10-07 MED ORDER — ASPIRIN 81 MG PO CHEW: 81.0000 mg | CHEWABLE_TABLET | ORAL | Status: DC

## 2018-10-07 MED ORDER — ANGIOPLASTY BOOK
Freq: Once | Status: AC
  Administered 2018-10-07: 21:00:00
  Filled 2018-10-07: qty 1

## 2018-10-07 MED ORDER — FENTANYL CITRATE (PF) 100 MCG/2ML IJ SOLN
INTRAMUSCULAR | Status: DC | PRN
  Administered 2018-10-07 (×4): 50 ug via INTRAVENOUS

## 2018-10-07 MED ORDER — LIDOCAINE HCL (PF) 1 % IJ SOLN
INTRAMUSCULAR | Status: DC | PRN
  Administered 2018-10-07: 20 mL via INTRADERMAL

## 2018-10-07 MED ORDER — NITROGLYCERIN 0.4 MG SL SUBL: 0.4000 mg | SUBLINGUAL_TABLET | SUBLINGUAL | Status: DC | PRN

## 2018-10-07 SURGICAL SUPPLY — 25 items
BALLN WOLVERINE 2.50X10 (BALLOONS) ×2
BALLOON WOLVERINE 2.50X10 (BALLOONS) IMPLANT
CATH INFINITI 5FR MPB2 (CATHETERS) ×1 IMPLANT
CATH INFINITI JR4 5F (CATHETERS) ×1 IMPLANT
CATH LAUNCHER 6FR AL2 (CATHETERS) IMPLANT
CATH SWAN GANZ 7F STRAIGHT (CATHETERS) ×1 IMPLANT
CATH VISTA GUIDE 6FR XB4 (CATHETERS) ×1 IMPLANT
CATHETER LAUNCHER 6FR AL2 (CATHETERS) ×2
CLOSURE MYNX CONTROL 6F/7F (Vascular Products) ×2 IMPLANT
KIT ENCORE 26 ADVANTAGE (KITS) ×1 IMPLANT
KIT HEART LEFT (KITS) ×2 IMPLANT
KIT HEMO VALVE WATCHDOG (MISCELLANEOUS) ×1 IMPLANT
KIT MICROPUNCTURE NIT STIFF (SHEATH) ×1 IMPLANT
PACK CARDIAC CATHETERIZATION (CUSTOM PROCEDURE TRAY) ×2 IMPLANT
SHEATH PINNACLE 5F 10CM (SHEATH) ×1 IMPLANT
SHEATH PINNACLE 6F 10CM (SHEATH) ×1 IMPLANT
SHEATH PINNACLE 7F 10CM (SHEATH) ×1 IMPLANT
SHEATH PROBE COVER 6X72 (BAG) ×1 IMPLANT
STENT SIERRA 3.00 X 23 MM (Permanent Stent) ×1 IMPLANT
TRANSDUCER W/STOPCOCK (MISCELLANEOUS) ×2 IMPLANT
TUBING CIL FLEX 10 FLL-RA (TUBING) ×2 IMPLANT
WIRE COUGAR XT STRL 190CM (WIRE) ×1 IMPLANT
WIRE EMERALD 3MM-J .035X150CM (WIRE) ×1 IMPLANT
WIRE EMERALD 3MM-J .035X260CM (WIRE) ×1 IMPLANT
WIRE TORQFLEX AUST .018X40CM (WIRE) ×1 IMPLANT

## 2018-10-07 NOTE — Interval H&P Note (Signed)
History and Physical Interval Note:  10/07/2018 10:53 AM  [name]  has presented today for surgery, with the diagnosis of cad - sob  The various methods of treatment have been discussed with the patient and family. After consideration of risks, benefits and other options for treatment, the patient has consented to  Procedure(s): RIGHT/LEFT HEART CATH AND CORONARY/GRAFT ANGIOGRAPHY (N/A) and possible angioplasty as a surgical intervention .  The patient's history has been reviewed, patient examined, no change in status, stable for surgery.  I have reviewed the patient's chart and labs.  Questions were answered to the patient's satisfaction.   Symptom Status: Ischemic Symptoms Non-invasive Testing: Intermediate Risk If no or indeterminate stress test, FFR/iFR results in all diseased vessels: N/A Diabetes Mellitus: No S/P CABG: Yes Antianginal therapy (number of long-acting drugs): >=2 Patient undergoing renal transplant: No Patient undergoing percutaneous valve procedure: No  LIMA-LAD patent and without significant stenoses Stenosis supplying 1 territory (bypass graft or native artery) other than anterior  PCI: A (8);  Indication 31  CABG: M (5);  Indication 31 Stenoses supplying 2 territories (bypass graft or native artery, either 2 separate vessels or sequential graft supplying 2 territories) not including anterior territory  PCI: A (8);  Indication 34  CABG: M (6);  Indication 34  LIMA-LAD not patent Stenosis supplying 1 territory (bypass graft or native artery)-anterior (LAD) territory  PCI: A (8);  Indication 36  CABG: M (6);  Indication 36 Stenoses supplying 2 territories (bypass graft or native artery, either 2 separate vessels or sequential graft supplying 2 territories)-LAD plus other territory  PCI: A (8);  Indication 39  CABG: A (8);  Indication 39 Stenoses supplying 3 territories (bypass graft or native arteries, separate vessels, sequential grafts, or combination  thereof)-LAD plus 2 other territories  PCI: A (8);  Indication 41  CABG: A (8);  Indication 41  Notes:  A indicates appropriate. M indicates may be appropriate. R indicates rarely appropriate. Number in parentheses is median score for that indication. Reclassify indicates number of functionally diseased vessels should be decreased given negative FFR/iFR. Re-evaluate the scenario interpreting any FFR/iFR negative vessel as being not significantly stenosed.  Journal of the [employer] of Cardiology Mar 2017, 23391; DOI: 10.1016/j.jacc.2017.02.001 [employer].de.[email or web address].full-text.pdf This App  2018 by the Society for Cardiovascular Angiography and Interventions  Adrian Prows

## 2018-10-07 NOTE — Research (Signed)
XIENCE 28 Informed Consent   Subject Name: [name]  Subject met inclusion and exclusion criteria.  The informed consent form, study requirements and expectations were reviewed with the subject and questions and concerns were addressed prior to the signing of the consent form.  The subject verbalized understanding of the trail requirements.  The subject agreed to participate in the XIENCE 28 trial and signed the informed consent.  The informed consent was obtained prior to performance of any protocol-specific procedures for the subject.  A copy of the signed informed consent was given to the subject and a copy was placed in the subject's medical record.  Hedrick,Tammy W/ Amy Terrell 10/07/2018, 1540

## 2018-10-08 ENCOUNTER — Encounter (HOSPITAL_COMMUNITY): Payer: Self-pay | Admitting: Cardiology

## 2018-10-08 DIAGNOSIS — R9439 Abnormal result of other cardiovascular function study: Secondary | ICD-10-CM | POA: Diagnosis not present

## 2018-10-08 DIAGNOSIS — I2510 Atherosclerotic heart disease of native coronary artery without angina pectoris: Secondary | ICD-10-CM | POA: Diagnosis not present

## 2018-10-08 DIAGNOSIS — R0609 Other forms of dyspnea: Secondary | ICD-10-CM | POA: Diagnosis not present

## 2018-10-08 DIAGNOSIS — Z951 Presence of aortocoronary bypass graft: Secondary | ICD-10-CM | POA: Diagnosis not present

## 2018-10-08 LAB — BASIC METABOLIC PANEL
ANION GAP: 5 (ref 5–15)
BUN: 10 mg/dL (ref 8–23)
CO2: 28 mmol/L (ref 22–32)
CREATININE: 1.21 mg/dL (ref 0.61–1.24)
Calcium: 8.9 mg/dL (ref 8.9–10.3)
Chloride: 106 mmol/L (ref 98–111)
GFR calc Af Amer: >60 mL/min (ref >60)
GFR calc non Af Amer: 56 mL/min — ABNORMAL LOW (ref >60)
GLUCOSE: 111 mg/dL — AB (ref 70–99)
Potassium: 3.8 mmol/L (ref 3.5–5.1)
Sodium: 139 mmol/L (ref 135–145)

## 2018-10-08 LAB — CBC
HCT: 43 % (ref 39.0–52.0)
HEMOGLOBIN: 13.3 g/dL (ref 13.0–17.0)
MCH: 26.5 pg (ref 26.0–34.0)
MCHC: 30.9 g/dL (ref 30.0–36.0)
MCV: 85.8 fL (ref 80.0–100.0)
Platelets: 174 10*3/uL (ref 150–400)
RBC: 5.01 MIL/uL (ref 4.22–5.81)
RDW: 15.2 % (ref 11.5–15.5)
WBC: 6.5 10*3/uL (ref 4.0–10.5)
nRBC: 0 % (ref 0.0–0.2)

## 2018-10-08 MED ORDER — FAMOTIDINE 20 MG PO TABS: 20.0000 mg | ORAL_TABLET | Freq: Every evening | ORAL | 1 refills | Status: DC

## 2018-10-08 MED ORDER — CLOPIDOGREL BISULFATE 75 MG PO TABS: 75.0000 mg | ORAL_TABLET | Freq: Every day | ORAL | 0 refills | Status: DC

## 2018-10-08 NOTE — Progress Notes (Addendum)
CARDIAC REHAB PHASE I   PRE:  Rate/Rhythm: 70 SR w/ PACs  BP:  Sitting: 135/65        SaO2: 92 RA  MODE:  Ambulation: 400 ft   POST:  Rate/Rhythm: 85 SR w/ PACs  BP:  Sitting: 145/75        SaO2: 95 RA  0915 - 1005  Pt ambulated independently 400 ft. Pt denies SOB and groin pain. Gait was slow and steady. Ed on stent, lifting restrictions, and medication (plavix) completed. HH diet and exercise guidelines were discussed. Pt previously could not do CRPII due to insurance co pays, but will send referral to [facility] CRPII.   Philis Kendall, MS 10/08/2018 9:57 AM

## 2018-10-08 NOTE — Research (Signed)
XIENCE 28 Research Study: Spoke with patient about XIENCE research study and that he would be Seeing Dr. Lia Foyer at 1 month (11/06/18 @ 11 am) if he remained stable we would stop Plavix and continue the ASA. Explained that the research required follow up would be phone calls @ 3,6 & 12 months. Patient was encouraged to call research office for any questions . Questions encouraged and answered. Patient verbalized understanding.

## 2018-10-08 NOTE — Discharge Summary (Signed)
Physician Discharge Summary  Patient ID: [name] MRN: [id number] DOB/AGE: [birth year] 78 y.o.  Admit date: 10/07/2018 Discharge date: 10/08/2018  Primary Discharge Diagnosis 1.  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  Shortness of breath, class III angina pectoris equivalent 3.  Post CABG status  Secondary Discharge Diagnosis 4.  Hypertension 5.  Hyperlipidemia  Significant Diagnostic Studies: Coronary angiogram 10/07/2018: Left main normal, LAD has a high-grade stenosis just after the origin of a high D1/Ramus.  D2 is occluded.  Distal LAD and D2 are supplied by LIMA to LAD which is widely patent and SVG to D2 which is widely patent.  Ramus 1/high diagonal 1 has a high-grade mildly calcified tandem 90% and 80% stenosis S/P 3.0 x 23 mm Xience Sierra DES to 0%. Circumflex gives origin to large OM1 with multiple secondary branches.  OM1 is occluded, distally supplied by SVG to OM1 which is widely patent.  Distal circumflex is very small. RCA is dominant, has mild to moderate diffuse disease in the mid segment and distal segment.  Patent SVG to PDA and PL.  Ostium of the SVG graft has 50% stenosis without pressure gradient with a 5 French diagnostic catheter engagement.  Flow in the mid to right was slow which improved with IC nitroglycerin. 200 mL contrast utilized. LV: Normal LVEF. Normal EDP  EKG: Sinus rhythm with first-degree AV block, unchanged from previous tracings, RBBB.  Right heart catheterization: Normal pressures, preserved cardiac output and cardiac index at 7.44 and 3.3 by Fick.  No evidence of pulmonary hypertension.   Hospital Course: Patient admitted for elective coronary angiogram and right heart catheterization, underwent successful revascularization.  Patient kept overnight due to inability to discharge patient due to time of the procedure.  Patient ambulated in the hallway without any chest  pain or shortness of breath the following morning, no EKG abnormalities, remained stable, labs normal.  Hence felt to be stable for discharging.  Recommendations on discharge:  Recommend uninterrupted dual antiplatelet therapy with Aspirin 81mg  daily and Clopidogrel 75mg  daily for a minimum of 30 days and Aspirin indefinately.. Patient enrolled in the registry for Xience 28 Study.  Discharge Exam: Blood pressure 119/75, pulse 60, temperature 98.2 F (36.8 C), resp. rate 16, height 5\' 11"  (1.803 m), weight 107 kg, SpO2 94 %.    General appearance: alert, cooperative, appears stated age and mildly obese Resp: clear to auscultation bilaterally Cardio: regular rate and rhythm, S1, S2 normal, no murmur, click, rub or gallop GI: soft, non-tender; bowel sounds normal; no masses,  no organomegaly Extremities: extremities normal, atraumatic, no cyanosis or edema Neurologic: Grossly normal Incision/Wound: Labs:   Lab Results  Component Value Date   WBC 6.5 10/08/2018   HGB 13.3 10/08/2018   HCT 43.0 10/08/2018   MCV 85.8 10/08/2018   PLT 174 10/08/2018    Recent Labs  Lab 10/08/18 0328  NA 139  K 3.8  CL 106  CO2 28  BUN 10  CREATININE 1.21  CALCIUM 8.9  GLUCOSE 111*    Lipid Panel     Component Value Date/Time   CHOL 134 09/08/2015 1210   TRIG 211.0 (H) 09/08/2015 1210   HDL 36.10 (L) 09/08/2015 1210   CHOLHDL 4 09/08/2015 1210   VLDL 42.2 (H) 09/08/2015 1210   LDLCALC 59 07/23/2014 1638  Radiology: Ct Angio Chest Aorta W/cm &/or Wo/cm  Result Date: 09/10/2018 CLINICAL DATA:  Follow-up aortic aneurysm  EXAM: CT ANGIOGRAPHY CHEST WITH CONTRAST TECHNIQUE: Multidetector CT imaging of the chest was performed using the standard protocol during bolus administration of intravenous contrast. Multiplanar CT image reconstructions and MIPs were obtained to evaluate the vascular anatomy. CONTRAST:  17mL ISOVUE-370 IOPAMIDOL (ISOVUE-370) INJECTION 76% Creatinine was obtained on site at  [facility] at [address] Results: Creatinine 1.3 mg/dL. COMPARISON:  08/12/2015 FINDINGS: Cardiovascular: Heart is mildly enlarged. Prior CABG. Mild aneurysmal dilatation of the ascending thoracic aorta, 4.5 cm maximally, stable. No dissection. Mediastinum/Nodes: Mildly enlarged and borderline sized mediastinal lymph nodes. Right paratracheal lymph node has a short axis diameter of 12 mm, stable. Lungs/Pleura: No confluent opacities or effusions. Calcified granulomas in the right upper lobe. Upper Abdomen: Imaging into the upper abdomen shows no acute findings. Prior cholecystectomy. Musculoskeletal: Chest wall soft tissues are unremarkable. No acute bony abnormality. Review of the MIP images confirms the above findings. 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 Recommend semi-annual imaging followup by CTA or MRA. This recommendation follows 2010 ACCF/AHA/AATS/ACR/ASA/SCA/SCAI/SIR/STS/SVM Guidelines for the Diagnosis and Management of Patients With Thoracic Aortic Disease. Circulation. 2010; 121: Z329-J242 Electronically Signed   By: Rolm Baptise M.D.   On: 09/10/2018 14:52   FOLLOW UP PLANS AND APPOINTMENTS Discharge Instructions    AMB Referral to Cardiac Rehabilitation - Phase II   Complete by:  As directed    Diagnosis:  Coronary Stents     Allergies as of 10/08/2018   No Known Allergies     Medication List    STOP taking these medications   omeprazole 20 MG capsule Commonly known as:  PRILOSEC     TAKE these medications   albuterol 108 (90 Base) MCG/ACT inhaler Commonly known as:  PROVENTIL HFA;VENTOLIN HFA Inhale 2 puffs into the lungs every 4 (four) hours as needed for wheezing or shortness of breath.   aspirin EC 81 MG tablet Take 1 tablet (81 mg total) by mouth daily.   atorvastatin 20 MG tablet Commonly known as:  LIPITOR Take 20 mg by mouth at bedtime.   clopidogrel 75 MG tablet Commonly known as:  PLAVIX Take 1 tablet (75 mg total)  by mouth daily with breakfast.   famotidine 20 MG tablet Commonly known as:  PEPCID Take 1 tablet (20 mg total) by mouth every evening.   Fish Oil 1200 MG Caps Take 1,200 mg by mouth daily.   furosemide 20 MG tablet Commonly known as:  LASIX Take 1 tablet (20 mg total) by mouth daily.   gabapentin 100 MG capsule Commonly known as:  NEURONTIN Take 100 mg by mouth 2 (two) times daily.   Garlic 6834 MG Caps Take 1,000 mg by mouth daily.   HYDROcodone-acetaminophen 5-325 MG tablet Commonly known as:  NORCO/VICODIN Take 1 tablet by mouth every 6 (six) hours as needed for moderate pain.   isosorbide mononitrate 30 MG 24 hr tablet Commonly known as:  IMDUR TAKE 1 TABLET BY MOUTH ONCE DAILY   ketoconazole 2 % cream Commonly known as:  NIZORAL Apply 1 application topically daily.   losartan 25 MG tablet Commonly known as:  COZAAR Take 25 mg by mouth every evening.   metoprolol succinate 50 MG 24 hr tablet Commonly known as:  TOPROL-XL Take 50 mg by mouth 2 (two) times daily. Take with or immediately following a meal.   multivitamin with minerals Tabs tablet Take 1 tablet by mouth daily.   naproxen sodium 220 MG tablet Commonly known as:  ALEVE Take 220 mg  by mouth 2 (two) times daily as needed (FOR PAIN.).   nitroGLYCERIN 0.4 MG SL tablet Commonly known as:  NITROSTAT Place 1 tablet (0.4 mg total) under the tongue every 5 (five) minutes as needed for chest pain.   potassium chloride 10 MEQ tablet Commonly known as:  K-DUR Take 1 tablet (10 mEq total) by mouth daily.   tamsulosin 0.4 MG Caps capsule Commonly known as:  FLOMAX Take 0.8 mg by mouth every evening.   Tiotropium Bromide-Olodaterol 2.5-2.5 MCG/ACT Aers Inhale 2 puffs into the lungs daily.   tiZANidine 4 MG tablet Commonly known as:  ZANAFLEX Take 4 mg by mouth at bedtime as needed for muscle spasms.      Follow-up Information    Adrian Prows, MD.   Specialty:  Cardiology Why:  Keep previous  appointment Contact information: [place] [id number] [phone]          Adrian Prows, MD 10/08/2018, 8:55 AM  Pager: [phone] Office: [phone] If no answer: [phone]

## 2018-10-11 ENCOUNTER — Encounter (HOSPITAL_COMMUNITY): Payer: Self-pay | Admitting: Cardiology

## 2018-10-13 ENCOUNTER — Telehealth: Payer: Self-pay

## 2018-10-13 NOTE — Telephone Encounter (Signed)
Numerous attempts were made to contact patient to schedule an OV prior to his colonoscopy on 10/30/18. Patient is on Plavix and must have an OV. Patient had a cardiac cath on 10/07/18. I will continue to try to contact patient.   Riki Sheer, LPN ( [facility] )

## 2018-10-14 NOTE — Telephone Encounter (Signed)
Called patient , message left that the colonoscopy needs to be cancelled related recent stent placement and blood thinners.patient is presently enrolled in clinical trial for blood thinners. Message left for the patient to call us regarding cancellation of procedure and previsit.

## 2018-10-15 NOTE — Telephone Encounter (Signed)
Called to cancel pts appointments due to recent stent placement and on Plavix. Left a voicemail.

## 2018-10-15 NOTE — Telephone Encounter (Signed)
Patient with drug-eluting cardiac stent placed on 10/07/2018; he is due for surveillance colonoscopy at this time due to 4 adenomatous polyps being removed in 2016.  Personal history of colon cancer He is now on Plavix which needs to be uninterrupted x12 months With this knowledge would delay recall colonoscopy by 12 months in order to allow Plavix to be held for colonoscopy  Would place recall for office visit for August 2020 to discuss surveillance colonoscopy after 10/08/2019

## 2018-10-15 NOTE — Telephone Encounter (Signed)
Recall office visit has been placed in Epic for 07/18/2019 and recall colonoscopy has been placed in Epic for 09/2019 (as a fail-safe should office visit not be scheduled for some reason). Current appointment for colonoscopy has been cancelled. I have left voicemail on home phone and wife's cell for patient to call back.

## 2018-10-16 ENCOUNTER — Telehealth: Payer: Self-pay | Admitting: Emergency Medicine

## 2018-10-16 NOTE — Telephone Encounter (Signed)
Samples have been provided to the pt. Nothing further was needed.

## 2018-10-17 NOTE — Telephone Encounter (Signed)
Again left another voicemail on patient's wife's phone for him to call back. Since we still have not received a return call, we will send a letter to patient's home address with our recommendations to hold off on procedure for now.

## 2018-10-19 ENCOUNTER — Encounter (HOSPITAL_COMMUNITY): Payer: Self-pay | Admitting: Cardiology

## 2018-10-26 ENCOUNTER — Encounter (HOSPITAL_COMMUNITY): Payer: Self-pay | Admitting: Cardiology

## 2018-10-30 ENCOUNTER — Encounter: Payer: Medicare Other | Admitting: Internal Medicine

## 2018-11-05 ENCOUNTER — Ambulatory Visit: Payer: Medicare Other | Admitting: Emergency Medicine

## 2018-11-07 VITALS — BP 139/76 | HR 74 | Wt 243.0 lb

## 2018-11-07 DIAGNOSIS — Z006 Encounter for examination for normal comparison and control in clinical research program: Secondary | ICD-10-CM

## 2018-11-07 NOTE — Research (Signed)
Patient seen and current status reviewed. .  The patient is 70, with prior CABG, and patent grafts x5 and new lesion in the LAD (small) was stented using a Xience stent.  The indication was stable CAD, and per Dr. Einar Gip was an uncomplicated procedure with good outcome. The patient  was started previously on Eliquis for atrial fibrillation but was not on it at the time of discharge or before PCI. The patient said it was given to him, but it is not clear why it was not continued and/or stopped.   The patient was enrolled in Xience 28 at the time of implant as reflected in the catheterization report.     Exam:  VS as noted.  Lungs clear.  Cardiac rhythm regular.  No excessive bruising. No edema.    EKG:  Unchanged from prior tracing  Protocol as outlined discussed with patient, along with risks and benefits of prolonged or shortened DAPT.  I reviewed with Dr. Einar Gip in detail current patient status, and his wishes with regard to patient and Xience 28 protocol.  He recommended continuing patient in the outlined protocol, and he plans to reconsider the issue of anticoagulation.  Instructions given to patient by research coordinator Amy Enid Derry.    Patient plans to have his son see Dr. Einar Gip as well.   Loretha Brasil. Lia Foyer, MD, [facility], [place] Director, [facility]

## 2018-11-07 NOTE — Research (Addendum)
Patient met with Dr Lia Foyer, no changes with patient at this time. Will proceed with XIENCE 28 study.Pt given instructions at this time to stop antiplatelet.     Contact Type:  '[x]'$  Office/hospital '[]'$  Phone  Subject compliant with DAPT regimen per protocol requirement?  '[x]'$  Yes  '[]'$  No  Any changes in antiplatelet medication? '[]'$  Yes  '[x]'$  No  Any new or changed adverse events? '[]'$  Yes  '[x]'$  No  Any new or changed anticoagulant medication? '[]'$  Yes  '[x]'$  No     Only to be used at 1 month follow up visit     Subject is free from Myocardial Infarction, repeat coronary revascularization, stroke, or stent thrombosis within the 1 month after stenting? '[x]'$  Yes  '[]'$  No  Subject is compliant with 1 month DAPT without interruption of either ASA and/or P2Y12 receptor inhibitor for >7 consecutive days '[x]'$  Yes  '[]'$  No  Investigator is comfortable with stopping P2Y12 inhibitor after 64-monthvisit and subject has been instructed to stop P2Y12? '[x]'$  Yes  '[]'$  No  If questions 3, 4, and 5 are all Yes - Subject is 1 month clear? '[x]'$  Yes  '[]'$  No    Current Outpatient Medications:  .  albuterol (PROVENTIL HFA;VENTOLIN HFA) 108 (90 Base) MCG/ACT inhaler, Inhale 2 puffs into the lungs every 4 (four) hours as needed for wheezing or shortness of breath., Disp: 1 Inhaler, Rfl: 5 .  aspirin EC 81 MG tablet, Take 1 tablet (81 mg total) by mouth daily., Disp: 30 tablet, Rfl: 3 .  atorvastatin (LIPITOR) 20 MG tablet, Take 20 mg by mouth at bedtime. , Disp: , Rfl: 5 .  clopidogrel (PLAVIX) 75 MG tablet, Take 1 tablet (75 mg total) by mouth daily with breakfast., Disp: 30 tablet, Rfl: 0 .  furosemide (LASIX) 20 MG tablet, Take 1 tablet (20 mg total) by mouth daily., Disp: 30 tablet, Rfl: 3 .  gabapentin (NEURONTIN) 100 MG capsule, Take 100 mg by mouth 2 (two) times daily., Disp: , Rfl:  .  Garlic 11610MG CAPS, Take 1,000 mg by mouth daily., Disp: , Rfl:  .  HYDROcodone-acetaminophen (NORCO/VICODIN) 5-325 MG tablet, Take 1 tablet by  mouth every 6 (six) hours as needed for moderate pain., Disp: , Rfl:  .  isosorbide mononitrate (IMDUR) 30 MG 24 hr tablet, TAKE 1 TABLET BY MOUTH ONCE DAILY, Disp: 90 tablet, Rfl: 0 .  ketoconazole (NIZORAL) 2 % cream, Apply 1 application topically daily., Disp: , Rfl:  .  losartan (COZAAR) 25 MG tablet, Take 25 mg by mouth every evening. , Disp: , Rfl: 5 .  metoprolol succinate (TOPROL-XL) 50 MG 24 hr tablet, Take 50 mg by mouth 2 (two) times daily. Take with or immediately following a meal., Disp: , Rfl:  .  Multiple Vitamin (MULTIVITAMIN WITH MINERALS) TABS tablet, Take 1 tablet by mouth daily., Disp: , Rfl:  .  naproxen sodium (ANAPROX) 220 MG tablet, Take 220 mg by mouth 2 (two) times daily as needed (FOR PAIN.). , Disp: , Rfl:  .  nitroGLYCERIN (NITROSTAT) 0.4 MG SL tablet, Place 1 tablet (0.4 mg total) under the tongue every 5 (five) minutes as needed for chest pain., Disp: 25 tablet, Rfl: 3 .  Omega-3 Fatty Acids (FISH OIL) 1200 MG CAPS, Take 1,200 mg by mouth daily., Disp: , Rfl:  .  potassium chloride (K-DUR) 10 MEQ tablet, Take 1 tablet (10 mEq total) by mouth daily., Disp: 30 tablet, Rfl: 3 .  tamsulosin (FLOMAX) 0.4 MG CAPS capsule,  Take 0.8 mg by mouth every evening. , Disp: , Rfl:  .  Tiotropium Bromide-Olodaterol (STIOLTO RESPIMAT) 2.5-2.5 MCG/ACT AERS, Inhale 2 puffs into the lungs daily., Disp: 2 Inhaler, Rfl: 0 .  tiZANidine (ZANAFLEX) 4 MG tablet, Take 4 mg by mouth at bedtime as needed for muscle spasms., Disp: , Rfl:  .  famotidine (PEPCID) 20 MG tablet, Take 1 tablet (20 mg total) by mouth every evening. (Patient not taking: Reported on 11/07/2018), Disp: 30 tablet, Rfl: 1

## 2018-11-07 NOTE — Research (Signed)
Pt arrived to research for the Xience study. Vital signs taken and documented.

## 2018-12-24 ENCOUNTER — Telehealth: Payer: Self-pay | Admitting: Emergency Medicine

## 2018-12-24 NOTE — Telephone Encounter (Signed)
Called patient, unable to reach left message to give [place] a call back. Will call back since it is after 5:00

## 2018-12-24 NOTE — Telephone Encounter (Signed)
Called and spoke with patient, he has been scheduled for an appointment nothing further needed.

## 2018-12-25 ENCOUNTER — Ambulatory Visit (INDEPENDENT_AMBULATORY_CARE_PROVIDER_SITE_OTHER): Payer: Medicare Other | Admitting: Nurse Practitioner

## 2018-12-25 ENCOUNTER — Ambulatory Visit: Payer: Medicare Other | Admitting: Primary Care

## 2018-12-25 ENCOUNTER — Encounter: Payer: Self-pay | Admitting: Nurse Practitioner

## 2018-12-25 VITALS — BP 144/72 | HR 68 | Ht 72.0 in | Wt 245.4 lb

## 2018-12-25 DIAGNOSIS — J449 Chronic obstructive pulmonary disease, unspecified: Secondary | ICD-10-CM | POA: Diagnosis not present

## 2018-12-25 MED ORDER — ALBUTEROL SULFATE (2.5 MG/3ML) 0.083% IN NEBU: 2.5000 mg | INHALATION_SOLUTION | Freq: Four times a day (QID) | RESPIRATORY_TRACT | 12 refills | Status: DC | PRN

## 2018-12-25 MED ORDER — TIOTROPIUM BROMIDE-OLODATEROL 2.5-2.5 MCG/ACT IN AERS: 2.0000 | INHALATION_SPRAY | Freq: Every day | RESPIRATORY_TRACT | 0 refills | Status: DC

## 2018-12-25 MED ORDER — PREDNISONE 10 MG PO TABS: ORAL_TABLET | ORAL | 0 refills | Status: DC

## 2018-12-25 NOTE — Progress Notes (Signed)
@Patient  ID: [name], male    DOB: Jun 02, 1941, 78 y.o.   MRN: [id number]  Chief Complaint  Patient presents with  . Shortness of Breath    Referring provider: Shirline Frees, MD  HPI 78 year old male with COPD and OSA followed by Dr. 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 Recommend semi-annual imaging followup by CTA or MRA.  CXR 07/22/18>> Mild enlargement of cardiac silhouette post CABG with pulmonary vascular congestion. No acute abnormalities.    OV 12/25/18 - shortness of breath, cough, and chest congestion Patient presents today with shortness of breath, cough, chest congestion.  States that symptoms started about a week ago.  Cough is been productive of yellow sputum.  He saw his PCP on 12/22/2018 and was prescribed doxycycline.  He was last seen by Dr. Lamonte Sakai on 09/11/2018 and started on Stiolto.  Patient states that he only took this for 2 weeks and it did significantly help his breathing.  He ran out of the sample and did not pick up the medication from the pharmacy.  He has only been using his Proventil as needed.  He denies any recent fever, chest pain, or edema.  He has followed up with his cardiologist since last visit with Dr. Lamonte Sakai.  He did have coronary artery stent placement and states that he has seen some improvement with his shortness of breath since the procedure.      No Known Allergies  Immunization History  Administered Date(s) Administered  . Influenza Split 06/25/2012, 09/22/2013  . Influenza Whole 10/17/2009  . Influenza, High Dose Seasonal PF 10/21/2018  . Influenza,inj,Quad PF,6+ Mos 11/24/2014  . Pneumococcal Polysaccharide-23 10/17/2005  . Zoster Recombinat (Shingrix) 05/14/2018, 08/08/2018    Past Medical History:  Diagnosis Date  . BPH (benign prostatic hyperplasia)   . CAD (coronary artery disease)    s/p CABG 2007; NSTEMI in setting of AFib with RVR 12/2009; cath 1/11: S-RCA ok with prox 30-40%  and 50% stenoses; S-OM and RI  with patent OM limb but an occluded RI limb; S-D2 and L-LAD ok; EF 55%  . Colon cancer ([place]) 1992  . Diverticulosis   . Essential hypertension   . GERD (gastroesophageal reflux disease)   . History of pulmonary embolus (PE) 2007   Following CABG  . Hyperlipidemia   . 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 OSA on CPAP   . Paroxysmal atrial fibrillation ([facility])    Previously on Multaq, declines anticoagulation  . Personal history of colonic polyps 01/22/2000   Tubular adenoma  . RBBB (right bundle branch block)     Tobacco History: Social History   Tobacco Use  Smoking Status Former Smoker  . Packs/day: 1.00  . Years: 45.00  . Pack years: 45.00  . Types: Cigarettes  . Last attempt to quit: 12/18/1995  . Years since quitting: 23.0  Smokeless Tobacco Never Used   Counseling given: Not Answered   Outpatient Encounter Medications as of 12/25/2018  Medication Sig  . albuterol (PROVENTIL HFA;VENTOLIN HFA) 108 (90 Base) MCG/ACT inhaler Inhale 2 puffs into the lungs every 4 (four) hours as needed for wheezing or shortness of breath.  Marland Kitchen aspirin EC 81 MG tablet Take 1 tablet (81 mg total) by mouth daily.  Marland Kitchen atorvastatin (LIPITOR) 20 MG tablet Take 20 mg by mouth at bedtime.   Marland Kitchen DOXYCYCLINE HYCLATE PO Take 100 mg by mouth 2 (two) times daily.  . famotidine (PEPCID) 20 MG tablet Take  1 tablet (20 mg total) by mouth every evening.  . furosemide (LASIX) 20 MG tablet Take 1 tablet (20 mg total) by mouth daily. (Patient taking differently: Take 40 mg by mouth daily. )  . gabapentin (NEURONTIN) 100 MG capsule Take 100 mg by mouth 2 (two) times daily.  . Garlic 4270 MG CAPS Take 1,000 mg by mouth daily.  Marland Kitchen guaiFENesin (MUCINEX) 600 MG 12 hr tablet Take 600 mg by mouth 2 (two) times daily.  Marland Kitchen HYDROcodone-acetaminophen (NORCO/VICODIN) 5-325 MG tablet Take 1 tablet by mouth every 6 (six) hours as needed for moderate pain.  Marland Kitchen losartan (COZAAR) 25 MG tablet Take 25 mg by  mouth every evening.   . metoprolol succinate (TOPROL-XL) 50 MG 24 hr tablet Take 50 mg by mouth 2 (two) times daily. Take with or immediately following a meal.  . Multiple Vitamin (MULTIVITAMIN WITH MINERALS) TABS tablet Take 1 tablet by mouth daily.  . nitroGLYCERIN (NITROSTAT) 0.4 MG SL tablet Place 1 tablet (0.4 mg total) under the tongue every 5 (five) minutes as needed for chest pain.  . Omega-3 Fatty Acids (FISH OIL) 1200 MG CAPS Take 1,200 mg by mouth daily.  . tamsulosin (FLOMAX) 0.4 MG CAPS capsule Take 0.8 mg by mouth every evening.   . Tiotropium Bromide-Olodaterol (STIOLTO RESPIMAT) 2.5-2.5 MCG/ACT AERS Inhale 2 puffs into the lungs daily.  Marland Kitchen tiZANidine (ZANAFLEX) 4 MG tablet Take 4 mg by mouth at bedtime as needed for muscle spasms.  . [DISCONTINUED] Tiotropium Bromide-Olodaterol (STIOLTO RESPIMAT) 2.5-2.5 MCG/ACT AERS Inhale 2 puffs into the lungs daily.  . [DISCONTINUED] Tiotropium Bromide-Olodaterol (STIOLTO RESPIMAT) 2.5-2.5 MCG/ACT AERS Inhale 2 puffs into the lungs daily.  Marland Kitchen albuterol (PROVENTIL) (2.5 MG/3ML) 0.083% nebulizer solution Take 3 mLs (2.5 mg total) by nebulization every 6 (six) hours as needed for wheezing or shortness of breath.  . clopidogrel (PLAVIX) 75 MG tablet Take 1 tablet (75 mg total) by mouth daily with breakfast. (Patient not taking: Reported on 12/25/2018)  . isosorbide mononitrate (IMDUR) 30 MG 24 hr tablet TAKE 1 TABLET BY MOUTH ONCE DAILY (Patient not taking: Reported on 12/25/2018)  . ketoconazole (NIZORAL) 2 % cream Apply 1 application topically daily.  . naproxen sodium (ANAPROX) 220 MG tablet Take 220 mg by mouth 2 (two) times daily as needed (FOR PAIN.).   Marland Kitchen potassium chloride (K-DUR) 10 MEQ tablet Take 1 tablet (10 mEq total) by mouth daily. (Patient not taking: Reported on 12/25/2018)  . predniSONE (DELTASONE) 10 MG tablet Take 4 tabs for 2 days, then 3 tabs for 2 days, then 2 tabs for 2 days, then 1 tab for 2 days, then stop   No  facility-administered encounter medications on file as of 12/25/2018.      Review of Systems  Review of Systems  Constitutional: Negative.  Negative for fever.  HENT: Negative.   Respiratory: Positive for cough and shortness of breath. Negative for wheezing.   Cardiovascular: Negative.  Negative for chest pain, palpitations and leg swelling.  Gastrointestinal: Negative.   Allergic/Immunologic: Negative.   Neurological: Negative.   Psychiatric/Behavioral: Negative.        Physical Exam  BP (!) 144/72 (BP Location: Left Arm, Patient Position: Sitting, Cuff Size: Normal)   Pulse 68   Ht 6' (1.829 m)   Wt 245 lb 6.4 oz (111.3 kg)   SpO2 96%   BMI 33.28 kg/m   Wt Readings from Last 5 Encounters:  12/25/18 245 lb 6.4 oz (111.3 kg)  11/07/18 243 lb (110.2 kg)  10/08/18 235 lb 14.3 oz (107 kg)  09/11/18 246 lb (111.6 kg)  09/10/18 245 lb 3.2 oz (111.2 kg)     Physical Exam Vitals signs and nursing note reviewed.  Constitutional:      General: He is not in acute distress.    Appearance: He is well-developed.  Cardiovascular:     Rate and Rhythm: Normal rate and regular rhythm.  Pulmonary:     Effort: Pulmonary effort is normal.     Breath sounds: Normal breath sounds. No decreased breath sounds, wheezing or rhonchi.  Musculoskeletal:     Right lower leg: No edema.  Skin:    General: Skin is warm and dry.  Neurological:     Mental Status: He is alert and oriented to person, place, and time.       Assessment & Plan:   COPD (chronic obstructive pulmonary disease) ([place]) Patient has not been compliant with inhalers. Will give another sample of Stiolto and send prescription to pharmacy.  Patient Instructions  Will give sample of stiolto to take 2 puffs daily every day Will order prednisone taper Will order Proventil in nebulizer - patient thinks itr will be more cost effective May take mucinex May take delsym Follow up with Dr. Lamonte Sakai at his first available  appointment in around 4 weeks or sooner if needed       Fenton Foy, NP 12/25/2018

## 2018-12-25 NOTE — Assessment & Plan Note (Signed)
Patient has not been compliant with inhalers. Will give another sample of Stiolto and send prescription to pharmacy.  Patient Instructions  Will give sample of stiolto to take 2 puffs daily every day Will order prednisone taper Will order Proventil in nebulizer - patient thinks itr will be more cost effective May take mucinex May take delsym Follow up with Dr. Lamonte Sakai at his first available appointment in around 4 weeks or sooner if needed

## 2018-12-25 NOTE — Patient Instructions (Addendum)
Will give sample of stiolto to take 2 puffs daily every day Will order prednisone taper Will order Proventil in nebulizer - patient thinks itr will be more cost effective May take mucinex May take delsym Follow up with Dr. Lamonte Sakai at his first available appointment in around 4 weeks or sooner if needed

## 2018-12-30 DIAGNOSIS — Z006 Encounter for examination for normal comparison and control in clinical research program: Secondary | ICD-10-CM

## 2018-12-30 NOTE — Research (Addendum)
3 month Xience 28 Research Study Follow-up. Pt states he is doing well and has no concerns at this time.   Contact Type:  []  Office/hospital [x]  Phone  Subject compliant with DAPT regimen per protocol requirement?  [x]  Yes  []  No  Any changes in antiplatelet medication? []  Yes  [x]  No  Any new or changed adverse events? []  Yes  [x]  No  Any new or changed anticoagulant medication? []  Yes  [x]  No     Only to be used at 1 month follow up visit     Subject is free from Myocardial Infarction, repeat coronary revascularization, stroke, or stent thrombosis within the 1 month after stenting? []  Yes  []  No  Subject is compliant with 1 month DAPT without interruption of either ASA and/or P2Y12 receptor inhibitor for >7 consecutive days []  Yes  []  No  Investigator is comfortable with stopping P2Y12 inhibitor after 9-month visit and subject has been instructed to stop P2Y12? []  Yes  []  No  If questions 3, 4, and 5 are all Yes - Subject is 1 month clear? []  Yes  []  No

## 2019-01-15 ENCOUNTER — Other Ambulatory Visit: Payer: Self-pay | Admitting: Cardiology

## 2019-01-19 ENCOUNTER — Other Ambulatory Visit: Payer: Self-pay

## 2019-01-20 ENCOUNTER — Other Ambulatory Visit: Payer: Self-pay | Admitting: Cardiology

## 2019-01-22 ENCOUNTER — Ambulatory Visit (INDEPENDENT_AMBULATORY_CARE_PROVIDER_SITE_OTHER): Payer: Medicare Other | Admitting: Emergency Medicine

## 2019-01-22 ENCOUNTER — Encounter: Payer: Self-pay | Admitting: Emergency Medicine

## 2019-01-22 DIAGNOSIS — G4733 Obstructive sleep apnea (adult) (pediatric): Secondary | ICD-10-CM | POA: Diagnosis not present

## 2019-01-22 DIAGNOSIS — J449 Chronic obstructive pulmonary disease, unspecified: Secondary | ICD-10-CM

## 2019-01-22 DIAGNOSIS — J3489 Other specified disorders of nose and nasal sinuses: Secondary | ICD-10-CM | POA: Insufficient documentation

## 2019-01-22 MED ORDER — TIOTROPIUM BROMIDE-OLODATEROL 2.5-2.5 MCG/ACT IN AERS: 2.0000 | INHALATION_SPRAY | Freq: Every day | RESPIRATORY_TRACT | 5 refills | Status: DC

## 2019-01-22 MED ORDER — FLUTICASONE PROPIONATE 50 MCG/ACT NA SUSP: 2.0000 | Freq: Every day | NASAL | 2 refills | Status: AC

## 2019-01-22 NOTE — Assessment & Plan Note (Signed)
Continue to wear your CPAP every night as you have been doing. °

## 2019-01-22 NOTE — Assessment & Plan Note (Signed)
Please start fluticasone nasal spray, 2 sprays each nostril once daily.  This is a maintenance medicine. Please continue your loratadine every other day as you have been taking it.

## 2019-01-22 NOTE — Assessment & Plan Note (Signed)
We will restart your Stiolto.  Take 2 puffs once daily every day on a schedule. Keep your albuterol available to use either 2 puffs or 1 nebulizer treatment up to every 4 hours if you needed for shortness of breath, chest tightness, wheezing. Follow with Dr Lamonte Sakai in 3 months or sooner if you have any problems

## 2019-01-22 NOTE — Patient Instructions (Addendum)
We will restart your Stiolto.  Take 2 puffs once daily every day on a schedule. Keep your albuterol available to use either 2 puffs or 1 nebulizer treatment up to every 4 hours if you needed for shortness of breath, chest tightness, wheezing. Continue to wear your CPAP every night as you have been doing. Please start fluticasone nasal spray, 2 sprays each nostril once daily.  This is a maintenance medicine. Please continue your loratadine every other day as you have been taking it. Follow with Dr Lamonte Sakai in 3 months or sooner if you have any problems.

## 2019-01-22 NOTE — Progress Notes (Signed)
Subjective:    Patient ID: [name], male    DOB: Nov 03, 1941, 78 y.o.   MRN: [id number]  Shortness of Breath  Pertinent negatives include no ear pain, fever, headaches, leg swelling, rash, rhinorrhea, sore throat, vomiting or wheezing.   Acute OV 08/08/18 --78 year old gentleman whom I have followed for former tobacco use (45 pack years) and mild obstructive lung disease (2016), obstructive sleep apnea with poor CPAP compliance.  He has a history of coronary artery disease/CABG, atrial fibrillation.  He has been seen in our office over the last 2 months with waxing and waning acute dyspnea.  It was felt that this could be related to his volume status, his prn lasix was converted to 20mg  scheduled, metoprolol was increased.  He was treated for an acute bronchitis by Dr Moreen Fowler about 2 weeks ago and was treated w abx, cough and mucous improved. He has established with Dr Nadyne Coombes for cardiology, also to follow with Dr Domenic Polite.   He still has exertional SOB, also can happen at rest. No wheeze, minimal cough.   ROV 09/11/18 --[name] is a former smoker followed for obstructive sleep apnea (not currently on CPAP), mild obstruction on pulmonary function testing.  He also has coronary disease/CABG, atrial fibrillation.  I saw him a month ago with increased dyspnea probably multifactorial.  He was to undergo pulmonary function testing today but he was unable due to dyspnea. I gave him albuterol to use as needed - he tried it 6-8 times, didn't notice much benefit. He has gained about 5 lbs in the last year. He is to see Dr Einar Gip this month. He is following with Dr Cyndia Bent for AAA. Split night PSG > AHI 62/h. He is willing to retry CPAP, needed CPAP 11 cm H2O  ROV 01/22/19 --this a follow-up visit for mild COPD by PFT, obstructive sleep apnea.  He also has a history of CAD/CABG, A. fib.  I tried starting him on Stiolto and he may have benefited but he did not fill the prescription.  He was seen here a month  ago and was treated with a pred taper for possible AE-COPD and recent URI. He was given Stiolto samples and again benefited. Also gave him albuterol nebs, has been using about 1x a day. He is good about wearing his CPAP machine. He is on loratadine qod, has a lot nasal congestion.    Review of Systems  Constitutional: Positive for fatigue. Negative for fever and unexpected weight change.  HENT: Negative for congestion, dental problem, ear pain, nosebleeds, postnasal drip, rhinorrhea, sinus pressure, sneezing, sore throat and trouble swallowing.   Eyes: Negative for redness and itching.  Respiratory: Negative for cough, chest tightness, shortness of breath and wheezing.   Cardiovascular: Negative for palpitations and leg swelling.  Gastrointestinal: Negative for nausea and vomiting.  Genitourinary: Negative for dysuria.  Musculoskeletal: Negative for joint swelling and myalgias.  Skin: Negative for rash.  Neurological: Negative for headaches.  Hematological: Does not bruise/bleed easily.  Psychiatric/Behavioral: Negative for dysphoric mood. The patient is not nervous/anxious.    Past Medical History:  Diagnosis Date  . BPH (benign prostatic hyperplasia)   . CAD (coronary artery disease)    s/p CABG 2007; NSTEMI in setting of AFib with RVR 12/2009; cath 1/11: S-RCA ok with prox 30-40% and 50% stenoses; S-OM and RI  with patent OM limb but an occluded RI limb; S-D2 and L-LAD ok; EF 55%  . Colon cancer ([place]) 1992  . Diverticulosis   .  Essential hypertension   . GERD (gastroesophageal reflux disease)   . History of pulmonary embolus (PE) 2007   Following CABG  . Hyperlipidemia   . 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 OSA on CPAP   . Paroxysmal atrial fibrillation ([facility])    Previously on Multaq, declines anticoagulation  . Personal history of colonic polyps 01/22/2000   Tubular adenoma  . RBBB (right bundle branch block)      Family History  Problem Relation Age of Onset  . Lung  cancer Mother   . Heart disease Mother   . Emphysema Father   . Heart disease Father   . Colon cancer Sister   . Breast cancer Sister   . Breast cancer Sister      Social History   Socioeconomic History  . Marital status: Married    Spouse name: Not on file  . Number of children: 4  . Years of education: Not on file  . Highest education level: Not on file  Occupational History  . Occupation: Retired    Comment: Chiropractor of [employer]  . Financial resource strain: Not on file  . Food insecurity:    Worry: Not on file    Inability: Not on file  . Transportation needs:    Medical: Not on file    Non-medical: Not on file  Tobacco Use  . Smoking status: Former Smoker    Packs/day: 1.00    Years: 45.00    Pack years: 45.00    Types: Cigarettes    Last attempt to quit: 12/18/1995    Years since quitting: 23.1  . Smokeless tobacco: Never Used  Substance and Sexual Activity  . Alcohol use: No    Comment: quit in 1980  . Drug use: No  . Sexual activity: Not on file  Lifestyle  . Physical activity:    Days per week: Not on file    Minutes per session: Not on file  . Stress: Not on file  Relationships  . Social connections:    Talks on phone: Not on file    Gets together: Not on file    Attends religious service: Not on file    Active member of club or organization: Not on file    Attends meetings of clubs or organizations: Not on file    Relationship status: Not on file  . Intimate partner violence:    Fear of current or ex partner: Not on file    Emotionally abused: Not on file    Physically abused: Not on file    Forced sexual activity: Not on file  Other Topics Concern  . Not on file  Social History Narrative   Daily caffeine   he has been exposed to animals - hogs, mules.    No Known Allergies   Outpatient Medications Prior to Visit  Medication Sig Dispense Refill  . albuterol (PROVENTIL HFA;VENTOLIN HFA) 108 (90 Base) MCG/ACT inhaler Inhale 2  puffs into the lungs every 4 (four) hours as needed for wheezing or shortness of breath. 1 Inhaler 5  . albuterol (PROVENTIL) (2.5 MG/3ML) 0.083% nebulizer solution Take 3 mLs (2.5 mg total) by nebulization every 6 (six) hours as needed for wheezing or shortness of breath. 75 mL 12  . aspirin EC 81 MG tablet Take 1 tablet (81 mg total) by mouth daily. 30 tablet 3  . atorvastatin (LIPITOR) 20 MG tablet Take 20 mg by mouth at bedtime.   5  .  furosemide (LASIX) 20 MG tablet Take 1 tablet (20 mg total) by mouth daily. (Patient taking differently: Take 40 mg by mouth daily. ) 30 tablet 3  . gabapentin (NEURONTIN) 100 MG capsule Take 100 mg by mouth 2 (two) times daily.    . Garlic 6314 MG CAPS Take 1,000 mg by mouth daily.    Marland Kitchen guaiFENesin (MUCINEX) 600 MG 12 hr tablet Take 600 mg by mouth 2 (two) times daily as needed.     Marland Kitchen HYDROcodone-acetaminophen (NORCO/VICODIN) 5-325 MG tablet Take 1 tablet by mouth every 6 (six) hours as needed for moderate pain.    . isosorbide mononitrate (IMDUR) 30 MG 24 hr tablet TAKE 1 TABLET BY MOUTH ONCE DAILY 30 tablet 0  . ketoconazole (NIZORAL) 2 % cream Apply 1 application topically daily.    Marland Kitchen losartan (COZAAR) 25 MG tablet Take 25 mg by mouth every evening.   5  . metoprolol succinate (TOPROL-XL) 50 MG 24 hr tablet Take 50 mg by mouth 2 (two) times daily. Take with or immediately following a meal.    . Multiple Vitamin (MULTIVITAMIN WITH MINERALS) TABS tablet Take 1 tablet by mouth daily.    . naproxen sodium (ANAPROX) 220 MG tablet Take 220 mg by mouth 2 (two) times daily as needed (FOR PAIN.).     Marland Kitchen nitroGLYCERIN (NITROSTAT) 0.4 MG SL tablet Place 1 tablet (0.4 mg total) under the tongue every 5 (five) minutes as needed for chest pain. 25 tablet 3  . Omega-3 Fatty Acids (FISH OIL) 1200 MG CAPS Take 1,200 mg by mouth daily.    . potassium chloride (K-DUR) 10 MEQ tablet TAKE 1 TABLET BY MOUTH ONCE DAILY 90 tablet 1  . tamsulosin (FLOMAX) 0.4 MG CAPS capsule Take 0.8  mg by mouth every evening.     Marland Kitchen tiZANidine (ZANAFLEX) 4 MG tablet Take 4 mg by mouth at bedtime as needed for muscle spasms.    . clopidogrel (PLAVIX) 75 MG tablet Take 1 tablet (75 mg total) by mouth daily with breakfast. (Patient not taking: Reported on 01/22/2019) 30 tablet 0  . DOXYCYCLINE HYCLATE PO Take 100 mg by mouth 2 (two) times daily.    . famotidine (PEPCID) 20 MG tablet Take 1 tablet (20 mg total) by mouth every evening. (Patient not taking: Reported on 01/22/2019) 30 tablet 1  . predniSONE (DELTASONE) 10 MG tablet Take 4 tabs for 2 days, then 3 tabs for 2 days, then 2 tabs for 2 days, then 1 tab for 2 days, then stop (Patient not taking: Reported on 01/22/2019) 20 tablet 0  . Tiotropium Bromide-Olodaterol (STIOLTO RESPIMAT) 2.5-2.5 MCG/ACT AERS Inhale 2 puffs into the lungs daily. (Patient not taking: Reported on 01/22/2019) 1 Inhaler 0   No facility-administered medications prior to visit.          Objective:   Physical Exam Vitals:   01/22/19 1607  BP: 126/68  Pulse: 80  SpO2: 95%  Weight: 248 lb (112.5 kg)  Height: 6' (1.829 m)   Gen: Pleasant, obese, in no distress,  normal affect  ENT: No lesions,  mouth clear,  oropharynx clear, no postnasal drip  Neck: No JVD, no stridor  Lungs: No use of accessory muscles, no wheeze, decreased at bases  Cardiovascular:  Regular, no evidence a fib currently   Musculoskeletal: amputation R index finger, no cyanosis or clubbing  Neuro: alert, non focal  Skin: Warm, no lesions or rashes     Assessment & Plan:  COPD (chronic obstructive pulmonary disease) (  [place]) We will restart your Stiolto.  Take 2 puffs once daily every day on a schedule. Keep your albuterol available to use either 2 puffs or 1 nebulizer treatment up to every 4 hours if you needed for shortness of breath, chest tightness, wheezing. Follow with Dr Lamonte Sakai in 3 months or sooner if you have any problems  Obstructive sleep apnea Continue to wear your CPAP every  night as you have been doing.  Nasal obstruction Please start fluticasone nasal spray, 2 sprays each nostril once daily.  This is a maintenance medicine. Please continue your loratadine every other day as you have been taking it.  Baltazar Apo, MD, PhD 01/22/2019, 4:40 PM  Pulmonary and Critical Care [phone] or if no answer [phone]

## 2019-01-30 ENCOUNTER — Encounter: Payer: Self-pay | Admitting: Internal Medicine

## 2019-02-17 ENCOUNTER — Other Ambulatory Visit: Payer: Self-pay | Admitting: Cardiology

## 2019-03-13 ENCOUNTER — Other Ambulatory Visit: Payer: Self-pay | Admitting: Cardiology

## 2019-03-24 ENCOUNTER — Ambulatory Visit: Payer: Self-pay | Admitting: Cardiology

## 2019-03-24 DIAGNOSIS — Z006 Encounter for examination for normal comparison and control in clinical research program: Secondary | ICD-10-CM

## 2019-03-24 NOTE — Research (Signed)
Xience 28 Research 6 Month Visit:  Contact Type:  []  Office/hospital [x]  Phone  Subject compliant with DAPT regimen per protocol requirement?  [x]  Yes  []  No  Any changes in antiplatelet medication? []  Yes  [x]  No  Any new or changed adverse events? []  Yes  [x]  No  Any new or changed anticoagulant medication? []  Yes  [x]  No     Only to be used at 1 month follow up visit     Subject is free from Myocardial Infarction, repeat coronary revascularization, stroke, or stent thrombosis within the 1 month after stenting? []  Yes  []  No  Subject is compliant with 1 month DAPT without interruption of either ASA and/or P2Y12 receptor inhibitor for >7 consecutive days []  Yes  []  No  Investigator is comfortable with stopping P2Y12 inhibitor after 72-month visit and subject has been instructed to stop P2Y12? []  Yes  []  No  If questions 3, 4, and 5 are all Yes - Subject is 1 month clear? []  Yes  []  No

## 2019-04-14 ENCOUNTER — Other Ambulatory Visit: Payer: Self-pay | Admitting: Cardiology

## 2019-04-22 ENCOUNTER — Ambulatory Visit: Payer: Medicare Other | Admitting: Emergency Medicine

## 2019-07-10 ENCOUNTER — Ambulatory Visit: Payer: Medicare Other | Admitting: Emergency Medicine

## 2019-07-21 ENCOUNTER — Other Ambulatory Visit: Payer: Self-pay

## 2019-07-21 ENCOUNTER — Encounter: Payer: Self-pay | Admitting: Emergency Medicine

## 2019-07-21 ENCOUNTER — Ambulatory Visit (INDEPENDENT_AMBULATORY_CARE_PROVIDER_SITE_OTHER): Payer: Medicare Other | Admitting: Emergency Medicine

## 2019-07-21 DIAGNOSIS — J301 Allergic rhinitis due to pollen: Secondary | ICD-10-CM

## 2019-07-21 DIAGNOSIS — J449 Chronic obstructive pulmonary disease, unspecified: Secondary | ICD-10-CM | POA: Diagnosis not present

## 2019-07-21 DIAGNOSIS — J309 Allergic rhinitis, unspecified: Secondary | ICD-10-CM | POA: Insufficient documentation

## 2019-07-21 DIAGNOSIS — G4733 Obstructive sleep apnea (adult) (pediatric): Secondary | ICD-10-CM | POA: Diagnosis not present

## 2019-07-21 NOTE — Assessment & Plan Note (Signed)
He has benefited from fluticasone nasal spray as needed, in fact he has stopped his loratadine.  We will continue the same regimen.

## 2019-07-21 NOTE — Assessment & Plan Note (Signed)
He benefited from [facility] and still believes that he does get benefit when he uses it-it makes his breathing better, allows him to clear secretions more effectively.  All the same he does not take it every day, has been using it as needed.  I encouraged him to go back to taking it on a schedule.  He will keep his albuterol use this as needed.

## 2019-07-21 NOTE — Patient Instructions (Signed)
You would probably benefit from using your Stiolto 2 puffs once daily every day.  Consider going back to this schedule. Keep your albuterol available use 2 puffs if needed for shortness of breath, chest tightness, wheezing. Continue your fluticasone nasal spray, 2 sprays each nostril once daily if needed for congestion. You would benefit from getting back on your CPAP.  If you continue to have mouth dryness on your machine we could consider a change in your mask or equipment Get the flu shot this fall Follow with Dr. Lamonte Sakai in 12 months or sooner if you have any problems.

## 2019-07-21 NOTE — Progress Notes (Signed)
   Subjective:    Patient ID: [name], male    DOB: Apr 26, 1941, 78 y.o.   MRN: [id number]  Shortness of Breath Pertinent negatives include no ear pain, fever, headaches, leg swelling, rash, rhinorrhea, sore throat, vomiting or wheezing.   ROV 07/21/2019 --78 year old man with mild COPD, obstructive sleep apnea.  He also has coronary disease, A. fib.  I continued to Stiolto at last visit.  He reports that he has been taking it prn - helps his breathing and secretion clearance.  He stopped using his CPAP machine - has been dealing with a lot of dry mouth, uses nasal mask w a chin strap.  He has stopped loratadine.  Last visit we added fluticasone nasal spray, using prn.    Review of Systems  Constitutional: Negative for fatigue, fever and unexpected weight change.  HENT: Negative for congestion, dental problem, ear pain, nosebleeds, postnasal drip, rhinorrhea, sinus pressure, sneezing, sore throat and trouble swallowing.   Eyes: Negative for redness and itching.  Respiratory: Negative for cough, chest tightness, shortness of breath and wheezing.   Cardiovascular: Negative for palpitations and leg swelling.  Gastrointestinal: Negative for nausea and vomiting.  Genitourinary: Negative for dysuria.  Musculoskeletal: Negative for joint swelling and myalgias.  Skin: Negative for rash.  Neurological: Negative for headaches.  Hematological: Does not bruise/bleed easily.  Psychiatric/Behavioral: Negative for dysphoric mood. The patient is not nervous/anxious.        Objective:   Physical Exam Vitals:   07/21/19 1509  BP: 116/70  Pulse: 78  SpO2: 97%  Weight: 237 lb (107.5 kg)  Height: 5\' 11"  (1.803 m)   Gen: Pleasant, obese, in no distress,  normal affect  ENT: No lesions,  mouth clear,  oropharynx clear, no postnasal drip  Neck: No JVD, no stridor  Lungs: No use of accessory muscles, no wheeze, decreased at bases  Cardiovascular:  Regular, no evidence a fib currently    Musculoskeletal: amputation R index finger, no cyanosis or clubbing  Neuro: alert, non focal  Skin: Warm, no lesions or rashes     Assessment & Plan:  COPD (chronic obstructive pulmonary disease) ([place]) He benefited from [employer] and still believes that he does get benefit when he uses it-it makes his breathing better, allows him to clear secretions more effectively.  All the same he does not take it every day, has been using it as needed.  I encouraged him to go back to taking it on a schedule.  He will keep his albuterol use this as needed.  Obstructive sleep apnea Since last time he unfortunately stopped his CPAP due to dry mouth.  He has tried a chinstrap and also make sure that he has humidity in his device.  Unclear why he is having dryness unless his mouth is opening.  We may need to reassess his equipment at some point.  I encouraged him to try to get back on this with better compliance.  Allergic rhinitis He has benefited from fluticasone nasal spray as needed, in fact he has stopped his loratadine.  We will continue the same regimen.  Baltazar Apo, MD, PhD 07/21/2019, 3:25 PM [place] [facility] [phone] or if no answer [phone]

## 2019-07-21 NOTE — Assessment & Plan Note (Signed)
Since last time he unfortunately stopped his CPAP due to dry mouth.  He has tried a chinstrap and also make sure that he has humidity in his device.  Unclear why he is having dryness unless his mouth is opening.  We may need to reassess his equipment at some point.  I encouraged him to try to get back on this with better compliance.

## 2019-07-24 ENCOUNTER — Other Ambulatory Visit: Payer: Self-pay | Admitting: Surgery

## 2019-07-24 ENCOUNTER — Other Ambulatory Visit: Payer: Self-pay | Admitting: Pulmonary Disease

## 2019-07-24 DIAGNOSIS — I712 Thoracic aortic aneurysm, without rupture, unspecified: Secondary | ICD-10-CM

## 2019-07-29 ENCOUNTER — Encounter: Payer: Self-pay | Admitting: Internal Medicine

## 2019-07-31 ENCOUNTER — Other Ambulatory Visit: Payer: Self-pay | Admitting: Pulmonary Disease

## 2019-08-03 ENCOUNTER — Other Ambulatory Visit: Payer: Self-pay | Admitting: Pulmonary Disease

## 2019-09-16 ENCOUNTER — Telehealth: Payer: Medicare Other | Admitting: Surgery

## 2019-09-16 ENCOUNTER — Other Ambulatory Visit: Payer: Self-pay

## 2019-09-16 ENCOUNTER — Ambulatory Visit
Admission: RE | Admit: 2019-09-16 | Discharge: 2019-09-16 | Disposition: A | Discharge status: Home / Self Care | Payer: Medicare Other | Source: Ambulatory Visit | Attending: Surgery | Admitting: Surgery

## 2019-09-16 DIAGNOSIS — I712 Thoracic aortic aneurysm, without rupture, unspecified: Secondary | ICD-10-CM

## 2019-09-16 MED ORDER — IOPAMIDOL (ISOVUE-370) INJECTION 76%
75.0000 mL | Freq: Once | INTRAVENOUS | Status: AC | PRN
  Administered 2019-09-16: 10:00:00 75 mL via INTRAVENOUS

## 2019-09-17 ENCOUNTER — Telehealth (INDEPENDENT_AMBULATORY_CARE_PROVIDER_SITE_OTHER): Payer: Medicare Other | Admitting: Surgery

## 2019-09-17 DIAGNOSIS — I714 Abdominal aortic aneurysm, without rupture: Secondary | ICD-10-CM | POA: Diagnosis not present

## 2019-09-17 NOTE — Telephone Encounter (Signed)
[address]       ,[place] [address]             [phone]     CARDIOTHORACIC SURGERY TELEPHONE VIRTUAL OFFICE NOTE  Referring Provider is No ref. provider found Primary Cardiologist is Rozann Lesches, MD PCP is Shirline Frees, MD   HPI:  I spoke with [name] (DOB 1941-05-27 ) via telephone on 9/302020 at 4:30 PM and verified that I was speaking with the correct person using more than one form of identification.  We discussed the reason(s) for conducting our visit virtually instead of in-person.  The patient expressed understanding the circumstances and agreed to proceed as described.   The patient is a 78 year old gentleman with hypertension, hyperlipidemia, atrial fibrillation, OSA on CPAP, COPD with chronic SOB followed by Dr. Lamonte Sakai and coronary artery disease s/p CABG in 2007.  He has a known fusiform ascending aortic aneurysm that was measured at 4.5 cm by CTA of the chest in Sept 2019. MRA of the chest on 09/12/2016 showed it to be 4.4 cm.  He has had multiple CT scans and MRAs of the chest since 2008 for evaluation of lung nodules and to follow the aorta.  I personally reviewed his CT chest in 2008 and the diameter of the ascending aorta was 4.2 cm.  He had an MRI of the chest and thigh 2012 the aorta measured at 4.5 cm.  He continues to feel well without any chest pain, back pain or shortness of breath.   Current Outpatient Medications  Medication Sig Dispense Refill  . albuterol (PROVENTIL HFA;VENTOLIN HFA) 108 (90 Base) MCG/ACT inhaler Inhale 2 puffs into the lungs every 4 (four) hours as needed for wheezing or shortness of breath. 1 Inhaler 5  . albuterol (PROVENTIL) (2.5 MG/3ML) 0.083% nebulizer solution Take 3 mLs (2.5 mg total) by nebulization every 6 (six) hours as needed for wheezing or shortness of breath. 75 mL 12  . aspirin EC 81 MG tablet Take 1 tablet (81 mg total) by mouth daily. 30 tablet 3  . atorvastatin (LIPITOR) 20 MG tablet  Take 20 mg by mouth at bedtime.   5  . fluticasone (FLONASE) 50 MCG/ACT nasal spray Place 2 sprays into both nostrils daily. 16 g 2  . furosemide (LASIX) 20 MG tablet Take 1 tablet (20 mg total) by mouth daily. (Patient taking differently: Take 40 mg by mouth daily. ) 30 tablet 3  . gabapentin (NEURONTIN) 100 MG capsule Take 100 mg by mouth 2 (two) times daily.    . Garlic 123XX123 MG CAPS Take 1,000 mg by mouth daily.    Marland Kitchen guaiFENesin (MUCINEX) 600 MG 12 hr tablet Take 600 mg by mouth 2 (two) times daily as needed.     Marland Kitchen HYDROcodone-acetaminophen (NORCO/VICODIN) 5-325 MG tablet Take 1 tablet by mouth every 6 (six) hours as needed for moderate pain.    . isosorbide mononitrate (IMDUR) 30 MG 24 hr tablet Take 1 tablet by mouth once daily 90 tablet 1  . ketoconazole (NIZORAL) 2 % cream Apply 1 application topically daily.    Marland Kitchen losartan (COZAAR) 25 MG tablet Take 25 mg by mouth every evening.   5  . metoprolol succinate (TOPROL-XL) 50 MG 24 hr tablet Take 50 mg by mouth 2 (two) times daily. Take with or immediately following a meal.    . Multiple Vitamin (MULTIVITAMIN WITH MINERALS) TABS tablet Take 1 tablet by mouth daily.    Marland Kitchen  naproxen sodium (ANAPROX) 220 MG tablet Take 220 mg by mouth 2 (two) times daily as needed (FOR PAIN.).     Marland Kitchen nitroGLYCERIN (NITROSTAT) 0.4 MG SL tablet Place 1 tablet (0.4 mg total) under the tongue every 5 (five) minutes as needed for chest pain. 25 tablet 3  . Omega-3 Fatty Acids (FISH OIL) 1200 MG CAPS Take 1,200 mg by mouth daily.    . potassium chloride (K-DUR) 10 MEQ tablet TAKE 1 TABLET BY MOUTH ONCE DAILY 90 tablet 1  . tamsulosin (FLOMAX) 0.4 MG CAPS capsule Take 0.8 mg by mouth every evening.     . Tiotropium Bromide-Olodaterol (STIOLTO RESPIMAT) 2.5-2.5 MCG/ACT AERS Inhale 2 puffs into the lungs daily. 1 Inhaler 5  . tiZANidine (ZANAFLEX) 4 MG tablet Take 4 mg by mouth at bedtime as needed for muscle spasms.     No current facility-administered medications for this  visit.      Diagnostic Tests:  CLINICAL DATA:  Thoracic aortic aneurysm, follow-up  EXAM: CT ANGIOGRAPHY CHEST WITH CONTRAST  TECHNIQUE: Multidetector CT imaging of the chest was performed using the standard protocol during bolus administration of intravenous contrast. Multiplanar CT image reconstructions and MIPs were obtained to evaluate the vascular anatomy.  CONTRAST:  61mL ISOVUE-370 IOPAMIDOL (ISOVUE-370) INJECTION 76%  COMPARISON:  09/10/2018  FINDINGS: Cardiovascular: Prior CABG. Heart is normal size. Ascending thoracic aortic aneurysm measures maximally 4.4 cm compared to 4.5 cm previously. Scattered aortic atherosclerosis. No dissection. No filling defects in the pulmonary arteries to suggest pulmonary emboli.  Mediastinum/Nodes: Scattered mediastinal lymph nodes, likely reactive, stable since prior study.  Lungs/Pleura: Calcified granuloma in the right upper lobe. No confluent opacities or effusions. Biapical pleural/parenchymal scarring.  Upper Abdomen: Imaging into the upper abdomen shows no acute findings.  Musculoskeletal: Chest wall soft tissues are unremarkable. No acute bony abnormality.  Review of the MIP images confirms the above findings.  IMPRESSION: 4.4 cm ascending thoracic aortic aneurysm. Recommend annual imaging followup by CTA or MRA. This recommendation follows 2010 ACCF/AHA/AATS/ACR/ASA/SCA/SCAI/SIR/STS/SVM Guidelines for the Diagnosis and Management of Patients with Thoracic Aortic Disease. Circulation. 2010; [id number]. Aortic aneurysm NOS (ICD10-I71.9)  Old granulomatous disease.  No acute cardiopulmonary disease.  Aortic Atherosclerosis (ICD10-I70.0).   Electronically Signed   By: Rolm Baptise M.D.   On: 09/16/2019 10:19    Impression:  He has a 4.4 cm fusiform ascending aortic aneurysm that is unchanged from last year.  This is been present since at least 2008 when the aorta was measured at 4.2 cm  by me.  This is still considered a small aneurysm and is well below the 5.5 cm surgical threshold.  Given his age I think it is reasonable to do a follow-up scan in 2 years.  I reviewed the CT results with him and stressed the importance of continued good blood pressure control and preventing further enlargement and acute aortic dissection.  I advised against doing any heavy lifting that may result in straining and Valsalva maneuver which could suddenly raise his blood pressure.  He is in agreement with having the scan done in 2 years and I will see him at that time.  Plan:  He will return to see me in 2 years with a CTA of the chest.     Gaye Pollack, MD 09/16/2019

## 2019-09-24 ENCOUNTER — Other Ambulatory Visit: Payer: Self-pay | Admitting: Pulmonary Disease

## 2019-09-24 ENCOUNTER — Telehealth: Payer: Self-pay | Admitting: Pulmonary Disease

## 2019-09-24 DIAGNOSIS — Z006 Encounter for examination for normal comparison and control in clinical research program: Secondary | ICD-10-CM

## 2019-09-24 DIAGNOSIS — J449 Chronic obstructive pulmonary disease, unspecified: Secondary | ICD-10-CM

## 2019-09-24 NOTE — Research (Signed)
Xience 28 Research Study 12 Month Follow up   Pt doing well at this time, no adverse events.  Contact Type:  []  Office/hospital [x]  Phone  Subject compliant with DAPT regimen per protocol requirement?  [x]  Yes  []  No  Any changes in antiplatelet medication? []  Yes  [x]  No  Any new or changed adverse events? []  Yes  [x]  No  Any new or changed anticoagulant medication? []  Yes  [x]  No     Only to be used at 1 month follow up visit     Subject is free from Myocardial Infarction, repeat coronary revascularization, stroke, or stent thrombosis within the 1 month after stenting? []  Yes  []  No  Subject is compliant with 1 month DAPT without interruption of either ASA and/or P2Y12 receptor inhibitor for >7 consecutive days []  Yes  []  No  Investigator is comfortable with stopping P2Y12 inhibitor after 43-month visit and subject has been instructed to stop P2Y12? []  Yes  []  No  If questions 3, 4, and 5 are all Yes - Subject is 1 month clear? []  Yes  []  No

## 2019-09-24 NOTE — Telephone Encounter (Signed)
Call returned to patient, he reports he needs a order for his nebulizer kit sent to [facility]. I inquired as to whether he needed a new nebulizer machine and patient denies. Per patient specify in order to provide nebulizer  Kit monthly.   RB, placed order for nebulizer kit for nebulizer. Please sign. Thanks.

## 2019-10-13 ENCOUNTER — Other Ambulatory Visit: Payer: Self-pay

## 2019-10-13 ENCOUNTER — Encounter: Payer: Self-pay | Admitting: Cardiology

## 2019-10-13 ENCOUNTER — Ambulatory Visit (INDEPENDENT_AMBULATORY_CARE_PROVIDER_SITE_OTHER): Payer: Medicare Other | Admitting: Cardiology

## 2019-10-13 VITALS — BP 118/79 | HR 98 | Temp 97.7°F | Ht 71.0 in | Wt 236.0 lb

## 2019-10-13 DIAGNOSIS — R0600 Dyspnea, unspecified: Secondary | ICD-10-CM | POA: Diagnosis not present

## 2019-10-13 DIAGNOSIS — I2510 Atherosclerotic heart disease of native coronary artery without angina pectoris: Secondary | ICD-10-CM | POA: Diagnosis not present

## 2019-10-13 DIAGNOSIS — I712 Thoracic aortic aneurysm, without rupture, unspecified: Secondary | ICD-10-CM

## 2019-10-13 DIAGNOSIS — E782 Mixed hyperlipidemia: Secondary | ICD-10-CM | POA: Diagnosis not present

## 2019-10-13 DIAGNOSIS — R0609 Other forms of dyspnea: Secondary | ICD-10-CM

## 2019-10-13 NOTE — Progress Notes (Signed)
Primary Physician:  Shirline Frees, MD   Patient ID: [name], male    DOB: 11-Feb-1941, 78 y.o.   MRN: [id number]  Subjective:    Chief Complaint  Patient presents with  . Hypertension  . Follow-up    HPI: [name]  is a 78 y.o. male  with known coronary artery disease and CABG in 2007 in the setting of non-STEMI in atrial fibrillation with RVR. Coronary angiogram 07/02/2017 revealed LIMA to LAD, SVG to D2, SVG to OM1 and ramus intermediate with OM1 limb occluded, SVG to RCA with a proximal 50% stenosis. Normal LVEF. Normal right heart pressure. 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  Due to worsening dyspnea on exertion, [name] underwent coronary angiography on 10/08/2018 and had a very high-grade stenosis of a large hiatal D1/ramus branch S/P 3.0 x 23 mm Sierra Xience DES. [name] had patent LIMA to LAD, SVG to D2 and SVG to RCA widely patent.  Since last seen 929 without any complaints.  [name] continues to have dyspnea on exertion but that is unchanged.  No chest pain, leg edema, PND or orthopnea.  Has occasional palpitations, that [name] states are not new for him and very brief. Do not occur regularly.  Tolerating medications well.  H/o A. Fib when [name] presented with NSTEMI in 2007 with no documented recurrence and [name] did not want to be on anticoagulation.   Past Medical History:  Diagnosis Date  . BPH (benign prostatic hyperplasia)   . CAD (coronary artery disease)    s/p CABG 2007; NSTEMI in setting of AFib with RVR 12/2009; cath 1/11: S-RCA ok with prox 30-40% and 50% stenoses; S-OM and RI  with patent OM limb but an occluded RI limb; S-D2 and L-LAD ok; EF 55%  . Colon cancer ([facility]) 1992  . Diverticulosis   . Essential hypertension   . GERD  (gastroesophageal reflux disease)   . History of pulmonary embolus (PE) 2007   Following CABG  . Hyperlipidemia   . 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 OSA on CPAP   . Paroxysmal atrial fibrillation ([facility])    Previously on Multaq, declines anticoagulation  . Personal history of colonic polyps 01/22/2000   Tubular adenoma  . RBBB (right bundle branch block)     Past Surgical History:  Procedure Laterality Date  . COLECTOMY  1992   w/ colostomy  related to colon cancer  . CORONARY ARTERY BYPASS GRAFT  2007  . CORONARY STENT INTERVENTION N/A 10/07/2018   Procedure: CORONARY STENT INTERVENTION;  Surgeon: Adrian Prows, MD;  Location: [facility];  Service: Cardiovascular;  Laterality: N/A;  . HAND Rake   tendon repair and nerve; left  . INGUINAL HERNIA REPAIR  2009  . LAPAROSCOPIC CHOLECYSTECTOMY  2004  . RIGHT/LEFT HEART CATH AND CORONARY/GRAFT ANGIOGRAPHY N/A 07/03/2017   Procedure: Right/Left Heart Cath and Coronary/Graft Angiography;  Surgeon: Leonie Man, MD;  Location: [facility];  Service: Cardiovascular;  Laterality: N/A;  . RIGHT/LEFT HEART CATH AND CORONARY/GRAFT ANGIOGRAPHY N/A 10/07/2018   Procedure: RIGHT/LEFT HEART CATH AND CORONARY/GRAFT ANGIOGRAPHY;  Surgeon: Adrian Prows, MD;  Location: [facility];  Service: Cardiovascular;  Laterality: N/A;  . VENTRAL HERNIA REPAIR  2009  Social History   Socioeconomic History  . Marital status: Married    Spouse name: Not on file  . Number of children: 4  . Years of education: Not on file  . Highest education level: Not on file  Occupational History  . Occupation: Retired    Comment: Chiropractor of [employer]  . Financial resource strain: Not on file  . Food insecurity    Worry: Not on file    Inability: Not on file  . Transportation needs    Medical: Not on file    Non-medical: Not on file  Tobacco Use  . Smoking status: Former Smoker    Packs/day:  1.00    Years: 45.00    Pack years: 45.00    Types: Cigarettes    Quit date: 12/18/1995    Years since quitting: 23.8  . Smokeless tobacco: Never Used  Substance and Sexual Activity  . Alcohol use: No    Comment: quit in 1980  . Drug use: No  . Sexual activity: Not on file  Lifestyle  . Physical activity    Days per week: Not on file    Minutes per session: Not on file  . Stress: Not on file  Relationships  . Social Herbalist on phone: Not on file    Gets together: Not on file    Attends religious service: Not on file    Active member of club or organization: Not on file    Attends meetings of clubs or organizations: Not on file    Relationship status: Not on file  . Intimate partner violence    Fear of current or ex partner: Not on file    Emotionally abused: Not on file    Physically abused: Not on file    Forced sexual activity: Not on file  Other Topics Concern  . Not on file  Social History Narrative   Daily caffeine     Review of Systems  Constitution: Negative for decreased appetite, malaise/fatigue, weight gain and weight loss.  Eyes: Negative for visual disturbance.  Cardiovascular: Positive for dyspnea on exertion and palpitations. Negative for chest pain, claudication, leg swelling, orthopnea and syncope.  Respiratory: Negative for hemoptysis and wheezing.   Endocrine: Negative for cold intolerance and heat intolerance.  Hematologic/Lymphatic: Does not bruise/bleed easily.  Skin: Negative for nail changes.  Musculoskeletal: Positive for back pain and joint pain (knee). Negative for muscle weakness and myalgias.  Gastrointestinal: Negative for abdominal pain, change in bowel habit, nausea and vomiting.  Neurological: Negative for difficulty with concentration, dizziness, focal weakness and headaches.  Psychiatric/Behavioral: Negative for altered mental status and suicidal ideas.  All other systems reviewed and are negative.     Objective:  Blood  pressure 118/79, pulse 98, temperature 97.7 F (36.5 C), height 5\' 11"  (1.803 m), weight 236 lb (107 kg), SpO2 93 %. Body mass index is 32.92 kg/m.    Physical Exam  Constitutional: [name] is oriented to person, place, and time. Vital signs are normal. [name] appears well-developed and well-nourished.  HENT:  Head: Normocephalic and atraumatic.  Neck: Normal range of motion.  Cardiovascular: Normal rate, regular rhythm, normal heart sounds and intact distal pulses. Frequent extrasystoles are present.  Pulses:      Femoral pulses are 2+ on the right side and 2+ on the left side.      Popliteal pulses are 1+ on the right side and 1+ on the left side.  Dorsalis pedis pulses are 1+ on the right side and 1+ on the left side.       Posterior tibial pulses are 2+ on the right side and 2+ on the left side.  No edema  Pulmonary/Chest: Effort normal and breath sounds normal. No accessory muscle usage. No respiratory distress.  Abdominal: Soft. Bowel sounds are normal.  Musculoskeletal: Normal range of motion.  Neurological: [name] is alert and oriented to person, place, and time.  Skin: Skin is warm and dry.  Vitals reviewed.  Radiology:  CTA of chest 09/16/2019:  4.4 cm ascending thoracic aortic aneurysm. Recommend annual imaging followup by CTA or MRA. This recommendation follows 2010 ACCF/AHA/AATS/ACR/ASA/SCA/SCAI/SIR/STS/SVM Guidelines for the Diagnosis and Management of Patients with Thoracic Aortic Disease. Circulation. 2010; [id number]. Aortic aneurysm NOS (ICD10-I71.9)  Old granulomatous disease.  No acute cardiopulmonary disease.  Aortic Atherosclerosis (ICD10-I70.0).  Laboratory examination:    CMP Latest Ref Rng & Units 10/08/2018 06/03/2018 06/28/2017  Glucose 70 - 99 mg/dL 111(H) 137(H) 100(H)  BUN 8 - 23 mg/dL 10 13 15   Creatinine 0.61 - 1.24 mg/dL 1.21 1.16 1.09  Sodium 135 - 145 mmol/L 139 138 136  Potassium 3.5 - 5.1 mmol/L 3.8 3.9 3.9  Chloride 98 - 111 mmol/L 106  105 104  CO2 22 - 32 mmol/L 28 25 24   Calcium 8.9 - 10.3 mg/dL 8.9 9.3 9.0  Total Protein 6.0 - 8.3 g/dL - - -  Total Bilirubin 0.2 - 1.2 mg/dL - - -  Alkaline Phos 39 - 117 U/L - - -  AST 0 - 37 U/L - - -  ALT 0 - 53 U/L - - -   CBC Latest Ref Rng & Units 10/08/2018 06/03/2018 06/28/2017  WBC 4.0 - 10.5 K/uL 6.5 6.0 6.5  Hemoglobin 13.0 - 17.0 g/dL 13.3 14.6 14.9  Hematocrit 39.0 - 52.0 % 43.0 43.7 44.4  Platelets 150 - 400 K/uL 174 180.0 157   Lipid Panel     Component Value Date/Time   CHOL 134 09/08/2015 1210   TRIG 211.0 (H) 09/08/2015 1210   HDL 36.10 (L) 09/08/2015 1210   CHOLHDL 4 09/08/2015 1210   VLDL 42.2 (H) 09/08/2015 1210   LDLCALC 59 07/23/2014 1638   LDLDIRECT 87.0 09/08/2015 1210   HEMOGLOBIN A1C No results found for: HGBA1C, MPG TSH No results for input(s): TSH in the last 8760 hours.  PRN Meds:. There are no discontinued medications. Current Meds  Medication Sig  . albuterol (PROVENTIL HFA;VENTOLIN HFA) 108 (90 Base) MCG/ACT inhaler Inhale 2 puffs into the lungs every 4 (four) hours as needed for wheezing or shortness of breath.  Marland Kitchen albuterol (PROVENTIL) (2.5 MG/3ML) 0.083% nebulizer solution Take 3 mLs (2.5 mg total) by nebulization every 6 (six) hours as needed for wheezing or shortness of breath.  Marland Kitchen aspirin EC 81 MG tablet Take 1 tablet (81 mg total) by mouth daily.  Marland Kitchen atorvastatin (LIPITOR) 20 MG tablet Take 20 mg by mouth at bedtime.   . fluticasone (FLONASE) 50 MCG/ACT nasal spray Place 2 sprays into both nostrils daily.  . furosemide (LASIX) 20 MG tablet Take 1 tablet (20 mg total) by mouth daily. (Patient taking differently: Take 40 mg by mouth daily. )  . gabapentin (NEURONTIN) 100 MG capsule Take 100 mg by mouth 2 (two) times daily.  . Garlic 123XX123 MG CAPS Take 1,000 mg by mouth daily.  Marland Kitchen HYDROcodone-acetaminophen (NORCO/VICODIN) 5-325 MG tablet Take 1 tablet by mouth every 6 (six) hours as needed for moderate  pain.  . isosorbide mononitrate  (IMDUR) 30 MG 24 hr tablet Take 1 tablet by mouth once daily  . ketoconazole (NIZORAL) 2 % cream Apply 1 application topically daily.  Marland Kitchen losartan (COZAAR) 25 MG tablet Take 25 mg by mouth every evening.   . metoprolol succinate (TOPROL-XL) 50 MG 24 hr tablet Take 50 mg by mouth 2 (two) times daily. Take with or immediately following a meal.  . Multiple Vitamin (MULTIVITAMIN WITH MINERALS) TABS tablet Take 1 tablet by mouth daily.  . naproxen sodium (ANAPROX) 220 MG tablet Take 220 mg by mouth 2 (two) times daily as needed (FOR PAIN.).   Marland Kitchen nitroGLYCERIN (NITROSTAT) 0.4 MG SL tablet Place 1 tablet (0.4 mg total) under the tongue every 5 (five) minutes as needed for chest pain.  . Omega-3 Fatty Acids (FISH OIL) 1200 MG CAPS Take 1,200 mg by mouth daily.  . potassium chloride (K-DUR) 10 MEQ tablet TAKE 1 TABLET BY MOUTH ONCE DAILY  . tamsulosin (FLOMAX) 0.4 MG CAPS capsule Take 0.8 mg by mouth every evening.   . Tiotropium Bromide-Olodaterol (STIOLTO RESPIMAT) 2.5-2.5 MCG/ACT AERS Inhale 2 puffs into the lungs daily.  Marland Kitchen tiZANidine (ZANAFLEX) 4 MG tablet Take 4 mg by mouth at bedtime as needed for muscle spasms.    Cardiac Studies:   Echocardiogram 09/26/2018: Left ventricle cavity is normal in size. Moderate concentric hypertrophy of the left ventricle. Abnormal septal wall motion due to post-operative coronary artery bypass graft. Doppler evidence of grade I (impaired) diastolic dysfunction, normal LAP. Calculated EF 56%. Left atrial cavity is normal in size. The interatrial Septum is thin and mobile but appears to be intact by 2D and CF Doppler interrogation. The aortic root is mildly dilated at 4.11 cm. Compared to Echocardiogram 03/19/2017: Normal LV systolic function, severe LVH, EF 0000000, grade 1 diastolic dysfunction. Right ventricle mildly dilated.  Coronary angiogram 10/08/2018 and had a very high-grade stenosis of a large hiatal D1/ramus branch S/P 3.0 x 23 mm Sierra Xience DES. Patent  LIMA to LAD, SVG to D2 and SVG to RCA widely patent. Normal right heart catheterization, preserved cardiac output and cardiac index.  Lexiscan sestamibi stress test 12/03/2016: No significant ST-T wave changes during stress test. Medium defect of moderate intensity in the basal inferior, mid inferoseptal, mid inferior and mid inferolateral and apical inferior and apical lateral location consistent with Myocardial infarction mild degree of peri-infarct ischemia. LVEF 52%. Intermediate risk study.  CABG 2007: LIMA to LAD, SVG to D2, SVG to OM1 and ramus intermediate with OM1 limb occluded, SVG to RCA with a proximal 50% stenosis by coronary angiography in July 2018. Normal LVEF.  Assessment:   Coronary artery disease involving native coronary artery of native heart without angina pectoris - Plan: EKG 12-Lead  Dyspnea on exertion  Thoracic aortic aneurysm without rupture ([place])  Mixed hyperlipidemia  EKG 10/13/2019: Sinus rhythm with first-degree AV block at the rate of 69 bpm, normal axis, right bundle branch block. No evidence of ischemia. No significant change from EKG 10/16/2018.  Recommendations:   Patient is here on a 1 year follow-up visit for CAD.  [name] is presently doing well without any symptoms of angina.  [name] does have chronic dyspnea on exertion that is unchanged and most likely multifactorial.  Encouraged him to work on weight loss to help with this with diet modifications and regular exercise.  Blood pressure is well controlled and [name] is on appropriate medical therapy.  [name] reports [name] has had recent labs with PCP  that were stable.  Will request for our records.  [name] has recently had CTA of the chest that showed stable aortic aneurysm at 4.4 cm.  This is being followed by Dr. Cyndia Bent and [name] states will have repeat CT scan in 2 years for surveillance.  Overall, patient is stable from cardiac standpoint.  We will plan to see him back in 1 year or sooner if needed.  Miquel Dunn, MSN,  APRN, FNP-C [facility]. [place] Office: [phone] Fax: [phone]

## 2019-10-14 ENCOUNTER — Encounter: Payer: Self-pay | Admitting: Cardiology

## 2019-10-29 ENCOUNTER — Other Ambulatory Visit: Payer: Self-pay

## 2019-10-29 ENCOUNTER — Ambulatory Visit
Admission: RE | Admit: 2019-10-29 | Discharge: 2019-10-29 | Disposition: A | Discharge status: Home / Self Care | Payer: Medicare Other | Source: Ambulatory Visit | Attending: Family Medicine | Admitting: Family Medicine

## 2019-10-29 ENCOUNTER — Other Ambulatory Visit: Payer: Self-pay | Admitting: Family Medicine

## 2019-10-29 DIAGNOSIS — M545 Low back pain, unspecified: Secondary | ICD-10-CM

## 2019-10-29 DIAGNOSIS — R600 Localized edema: Secondary | ICD-10-CM

## 2019-11-05 ENCOUNTER — Other Ambulatory Visit: Payer: Self-pay

## 2019-11-05 ENCOUNTER — Encounter: Payer: Self-pay | Admitting: Internal Medicine

## 2019-11-05 ENCOUNTER — Ambulatory Visit (INDEPENDENT_AMBULATORY_CARE_PROVIDER_SITE_OTHER): Payer: Medicare Other | Admitting: Internal Medicine

## 2019-11-05 VITALS — BP 120/80 | HR 92 | Temp 98.4°F | Ht 71.0 in | Wt 234.8 lb

## 2019-11-05 DIAGNOSIS — Z86010 Personal history of colonic polyps: Secondary | ICD-10-CM

## 2019-11-05 DIAGNOSIS — Z1159 Encounter for screening for other viral diseases: Secondary | ICD-10-CM

## 2019-11-05 DIAGNOSIS — Z85038 Personal history of other malignant neoplasm of large intestine: Secondary | ICD-10-CM | POA: Diagnosis not present

## 2019-11-05 MED ORDER — SUPREP BOWEL PREP KIT 17.5-3.13-1.6 GM/177ML PO SOLN: 1.0000 | ORAL | 0 refills | Status: DC

## 2019-11-05 NOTE — Patient Instructions (Signed)
You have been scheduled for a colonoscopy. Please follow written instructions given to you at your visit today.  Please pick up your prep supplies at the pharmacy within the next 1-3 days. If you use inhalers (even only as needed), please bring them with you on the day of your procedure. Your physician has requested that you go to www.[email or web address].com and enter the access code given to you at your visit today. This web site gives a general overview about your procedure. However, you should still follow specific instructions given to you by our office regarding your preparation for the procedure.  Please purchase the following medications over the counter and take as directed: Benefiber  If you are age 15 or older, your body mass index should be between 23-30. Your Body mass index is 32.75 kg/m. If this is out of the aforementioned range listed, please consider follow up with your Primary Care Provider.  If you are age 71 or younger, your body mass index should be between 19-25. Your Body mass index is 32.75 kg/m. If this is out of the aformentioned range listed, please consider follow up with your Primary Care Provider.

## 2019-11-05 NOTE — Progress Notes (Signed)
Subjective:    Patient ID: [name], male    DOB: 11-14-1941, 78 y.o.   MRN: [id number]  HPI  Dilraj Herin is a 78 year old male with a past medical history of sigmoid cancer status post resection, adenomatous colon polyps, family history of colon cancer, colonic diverticulosis, GERD who is seen to discuss surveillance colonoscopy.  He is here alone today.  He has a history of CAD with prior CABG, A. fib in the setting of a non-ST elevation MI, normal LVEF, small abdominal aortic aneurysm followed by Dr. Caffie Pinto and COPD.  He does not wear oxygen.  He reports that he has been feeling well lately but has noticed a change in his bowel pattern over the last 2 to 3 weeks.  This is somewhat worrisome to him.  He was having 1 bowel movement every day to every other day and now he is having smaller, softer stools 2-3 times a day.  No blood or melena.  No abdominal pain.  No upper GI or hepatobiliary complaint.  He does have stable dyspnea but denies chest pain.  Some mild edema in his feet bilaterally late in the day but this is improved after recent titration of his blood pressure medication.  He does have intermittent lower back pain for which he will occasionally use hydrocodone.  He uses hydrocodone for several days in a row he will develop constipation.  He uses this very rarely.  His last colonoscopy was 09/19/2015.  This revealed 4 sessile polyps found to be tubular adenoma, severe left-sided diverticulosis.  There was a unremarkable sigmoid anastomosis from prior sigmoid cancer resection.  Review of Systems As per HPI, otherwise negative  Current Medications, Allergies, Past Medical History, Past Surgical History, Family History and Social History were reviewed in [employer] record.     Objective:   Physical Exam BP 120/80 (BP Location: Left Arm, Patient Position: Sitting)   Pulse 92   Temp 98.4 F (36.9 C)   Ht 5\' 11"  (1.803 m)   Wt 234 lb 12.8 oz (106.5  kg)   SpO2 95%   BMI 32.75 kg/m  Thanks Gen: awake, alert, NAD HEENT: anicteric, op clear CV: RRR, no mrg Pulm: CTA b/l Abd: soft, NT/ND, +BS throughout Ext: no c/c/e Neuro: nonfocal  CBC    Component Value Date/Time   WBC 6.5 10/08/2018 0328   RBC 5.01 10/08/2018 0328   HGB 13.3 10/08/2018 0328   HCT 43.0 10/08/2018 0328   PLT 174 10/08/2018 0328   MCV 85.8 10/08/2018 0328   MCH 26.5 10/08/2018 0328   MCHC 30.9 10/08/2018 0328   RDW 15.2 10/08/2018 0328   LYMPHSABS 1.9 06/03/2018 1119   MONOABS 0.5 06/03/2018 1119   EOSABS 0.1 06/03/2018 1119   BASOSABS 0.0 06/03/2018 1119   CMP     Component Value Date/Time   NA 139 10/08/2018 0328   K 3.8 10/08/2018 0328   CL 106 10/08/2018 0328   CO2 28 10/08/2018 0328   GLUCOSE 111 (H) 10/08/2018 0328   BUN 10 10/08/2018 0328   CREATININE 1.21 10/08/2018 0328   CREATININE 1.15 10/13/2012 0942   CALCIUM 8.9 10/08/2018 0328   PROT 7.1 09/08/2015 1210   ALBUMIN 3.9 09/08/2015 1210   AST 19 09/08/2015 1210   ALT 22 09/08/2015 1210   ALKPHOS 43 09/08/2015 1210   BILITOT 0.5 09/08/2015 1210   GFRNONAA 56 (L) 10/08/2018 0328   GFRAA >60 10/08/2018 [id number]  Assessment & Plan:  78 year old male with a past medical history of sigmoid cancer status post resection, adenomatous colon polyps, family history of colon cancer, colonic diverticulosis, GERD who is seen to discuss surveillance colonoscopy  1.  Personal history of colon cancer/history of adenomatous polyp/family history of colon cancer --he is certainly high risk for colonic pathology/neoplasia given his personal and family history.  He does have multiple medical conditions including CAD and COPD but they are stable.  We discussed the risk, benefits and alternatives to repeating colonoscopy.  After our discussion he wishes to proceed.  I think this is reasonable and the right decision.  We will schedule surveillance colonoscopy in the coming weeks.  2.  Change in bowel  habits --mild without bleeding over the last several weeks.  I recommended he begin Benefiber 1 working to 2 tablespoons daily to help normalize bowel movements.  We are also proceeding to colonoscopy as in #1    .

## 2019-11-13 LAB — SARS CORONAVIRUS 2 (TAT 6-24 HRS): SARS Coronavirus 2: NEGATIVE

## 2019-11-18 ENCOUNTER — Other Ambulatory Visit: Payer: Self-pay

## 2019-11-18 ENCOUNTER — Encounter: Payer: Self-pay | Admitting: Internal Medicine

## 2019-11-18 ENCOUNTER — Ambulatory Visit (AMBULATORY_SURGERY_CENTER): Payer: Medicare Other | Admitting: Internal Medicine

## 2019-11-18 VITALS — BP 111/78 | HR 89 | Temp 98.4°F | Resp 29 | Ht 71.0 in | Wt 234.0 lb

## 2019-11-18 DIAGNOSIS — Z86010 Personal history of colonic polyps: Secondary | ICD-10-CM

## 2019-11-18 DIAGNOSIS — D123 Benign neoplasm of transverse colon: Secondary | ICD-10-CM

## 2019-11-18 DIAGNOSIS — Z85038 Personal history of other malignant neoplasm of large intestine: Secondary | ICD-10-CM

## 2019-11-18 DIAGNOSIS — D122 Benign neoplasm of ascending colon: Secondary | ICD-10-CM | POA: Diagnosis not present

## 2019-11-18 DIAGNOSIS — D125 Benign neoplasm of sigmoid colon: Secondary | ICD-10-CM

## 2019-11-18 DIAGNOSIS — D124 Benign neoplasm of descending colon: Secondary | ICD-10-CM

## 2019-11-18 MED ORDER — SODIUM CHLORIDE 0.9 % IV SOLN: 500.0000 mL | Freq: Once | INTRAVENOUS | Status: DC

## 2019-11-18 NOTE — Progress Notes (Signed)
Pt's states no medical or surgical changes since previsit or office visit.  LC - temp KA - vitals 

## 2019-11-18 NOTE — Patient Instructions (Signed)
YOU HAD AN ENDOSCOPIC PROCEDURE TODAY AT THE [facility]:   Refer to the procedure report that was given to you for any specific questions about what was found during the examination.  If the procedure report does not answer your questions, please call your gastroenterologist to clarify.  If you requested that your care partner not be given the details of your procedure findings, then the procedure report has been included in a sealed envelope for you to review at your convenience later.  YOU SHOULD EXPECT: Some feelings of bloating in the abdomen. Passage of more gas than usual.  Walking can help get rid of the air that was put into your GI tract during the procedure and reduce the bloating. If you had a lower endoscopy (such as a colonoscopy or flexible sigmoidoscopy) you may notice spotting of blood in your stool or on the toilet paper. If you underwent a bowel prep for your procedure, you may not have a normal bowel movement for a few days.  Please Note:  You might notice some irritation and congestion in your nose or some drainage.  This is from the oxygen used during your procedure.  There is no need for concern and it should clear up in a day or so.  SYMPTOMS TO REPORT IMMEDIATELY:   Following lower endoscopy (colonoscopy or flexible sigmoidoscopy):  Excessive amounts of blood in the stool  Significant tenderness or worsening of abdominal pains  Swelling of the abdomen that is new, acute  Fever of 100F or higher  For urgent or emergent issues, a gastroenterologist can be reached at any hour by calling [phone].   DIET:  We do recommend a small meal at first, but then you may proceed to your regular diet.  Drink plenty of fluids but you should avoid alcoholic beverages for 24 hours.  ACTIVITY:  You should plan to take it easy for the rest of today and you should NOT DRIVE or use heavy machinery until tomorrow (because of the sedation medicines used during the test).     FOLLOW UP: Our staff will call the number listed on your records 48-72 hours following your procedure to check on you and address any questions or concerns that you may have regarding the information given to you following your procedure. If we do not reach you, we will leave a message.  We will attempt to reach you two times.  During this call, we will ask if you have developed any symptoms of COVID 19. If you develop any symptoms (ie: fever, flu-like symptoms, shortness of breath, cough etc.) before then, please call [phone].  If you test positive for Covid 19 in the 2 weeks post procedure, please call and report this information to us.    If any biopsies were taken you will be contacted by phone or by letter within the next 1-3 weeks.  Please call us at [phone] if you have not heard about the biopsies in 3 weeks.    SIGNATURES/CONFIDENTIALITY: You and/or your care partner have signed paperwork which will be entered into your electronic medical record.  These signatures attest to the fact that that the information above on your After Visit Summary has been reviewed and is understood.  Full responsibility of the confidentiality of this discharge information lies with you and/or your care-partner. 

## 2019-11-18 NOTE — Progress Notes (Signed)
Report to PACU, RN, vss, BBS= Clear.  

## 2019-11-18 NOTE — Progress Notes (Signed)
Called to room to assist during endoscopic procedure.  Patient ID and intended procedure confirmed with present staff. Received instructions for my participation in the procedure from the performing physician.  

## 2019-11-18 NOTE — Op Note (Signed)
[facility] Patient Name: [name] Procedure Date: 11/18/2019 2:02 PM MRN: [id number] Endoscopist: Jerene Bears , MD Age: 78 Referring MD:  Date of Birth: [birth year] Gender: Male Account #: [id number] Procedure:                Colonoscopy Indications:              Surveillance: Personal history of adenomatous                            polyps on last colonoscopy 3 years ago, High risk                            colon cancer surveillance: Personal history of                            colon cancer Medicines:                Monitored Anesthesia Care Procedure:                Pre-Anesthesia Assessment:                           - Prior to the procedure, a History and Physical                            was performed, and patient medications and                            allergies were reviewed. The patient's tolerance of                            previous anesthesia was also reviewed. The risks                            and benefits of the procedure and the sedation                            options and risks were discussed with the patient.                            All questions were answered, and informed consent                            was obtained. Prior Anticoagulants: The patient has                            taken no previous anticoagulant or antiplatelet                            agents. ASA Grade Assessment: III - A patient with                            severe systemic disease. After reviewing the risks  and benefits, the patient was deemed in                            satisfactory condition to undergo the procedure.                           After obtaining informed consent, the colonoscope                            was passed under direct vision. Throughout the                            procedure, the patient's blood pressure, pulse, and                            oxygen saturations were monitored continuously. The                         Colonoscope was introduced through the anus and                            advanced to the cecum, identified by appendiceal                            orifice and ileocecal valve. The colonoscopy was                            performed without difficulty. The patient tolerated                            the procedure well. The quality of the bowel                            preparation was good. The ileocecal valve,                            appendiceal orifice, and rectum were photographed. Scope In: 2:13:43 PM Scope Out: 2:40:13 PM Scope Withdrawal Time: 0 hours 24 minutes 27 seconds  Total Procedure Duration: 0 hours 26 minutes 30 seconds  Findings:                 The digital rectal exam was normal.                           Two sessile polyps were found in the ascending                            colon. The polyps were 3 to 5 mm in size. These                            polyps were removed with a cold snare. Resection                            and retrieval were complete.  Nine sessile polyps were found in the transverse                            colon. The polyps were 2 to 5 mm in size. These                            polyps were removed with a cold snare. Resection                            and retrieval were complete.                           Three sessile polyps were found in the descending                            colon. The polyps were 3 to 5 mm in size. These                            polyps were removed with a cold snare. Resection                            and retrieval were complete.                           A 4 mm polyp was found in the sigmoid colon. The                            polyp was sessile. The polyp was removed with a                            cold snare. Resection and retrieval were complete.                           There was evidence of a prior end-to-end                            colo-colonic  anastomosis in the sigmoid colon. This                            was patent and was characterized by healthy                            appearing mucosa.                           Many small and large-mouthed diverticula were found                            in the sigmoid colon, descending colon and                            transverse colon.  Internal hemorrhoids were found during retroflexion. Complications:            No immediate complications. Estimated Blood Loss:     Estimated blood loss was minimal. Impression:               - Two 3 to 5 mm polyps in the ascending colon,                            removed with a cold snare. Resected and retrieved.                           - Nine 2 to 5 mm polyps in the transverse colon,                            removed with a cold snare. Resected and retrieved.                           - Three 3 to 5 mm polyps in the descending colon,                            removed with a cold snare. Resected and retrieved.                           - One 4 mm polyp in the sigmoid colon, removed with                            a cold snare. Resected and retrieved.                           - Patent end-to-end colo-colonic anastomosis,                            characterized by healthy appearing mucosa.                           - Diverticulosis in the sigmoid colon, in the                            descending colon and in the transverse colon.                           - Internal hemorrhoids. Recommendation:           - Patient has a contact number available for                            emergencies. The signs and symptoms of potential                            delayed complications were discussed with the                            patient. Return to normal activities tomorrow.  Written discharge instructions were provided to the                            patient.                           -  Resume previous diet.                           - Continue present medications.                           - Await pathology results.                           - Repeat colonoscopy may be recommended for                            surveillance given history of numerous polyps                            removed today. The colonoscopy date will be                            determined after pathology results from today's                            exam become available for review and discussion                            next year with the patient. Jerene Bears, MD 11/18/2019 2:44:50 PM This report has been signed electronically.

## 2019-11-20 ENCOUNTER — Telehealth: Payer: Self-pay

## 2019-11-20 NOTE — Telephone Encounter (Signed)
Left message on answering machine. 

## 2019-11-24 ENCOUNTER — Encounter: Payer: Self-pay | Admitting: Internal Medicine

## 2019-12-17 ENCOUNTER — Other Ambulatory Visit: Payer: Self-pay | Admitting: Cardiology

## 2020-01-18 ENCOUNTER — Other Ambulatory Visit: Payer: Self-pay

## 2020-01-18 ENCOUNTER — Telehealth: Payer: Self-pay

## 2020-01-18 DIAGNOSIS — Z85038 Personal history of other malignant neoplasm of large intestine: Secondary | ICD-10-CM

## 2020-01-18 DIAGNOSIS — Z86010 Personal history of colonic polyps: Secondary | ICD-10-CM

## 2020-01-18 NOTE — Telephone Encounter (Signed)
Referral entered in epic. 

## 2020-01-18 NOTE — Telephone Encounter (Signed)
-----   Message from Jerene Bears, MD sent at 01/15/2020 12:59 PM EST ----- Please refer Dr. Luella Cook to medical genetics for personal history of colon cancer and multiple adenomatous polyps

## 2020-01-20 ENCOUNTER — Telehealth: Payer: Self-pay | Admitting: Genetic Counselor

## 2020-01-20 NOTE — Telephone Encounter (Signed)
A genetic counseling appt has been scheduled for [name] on 2/9 at 2pm to see Raquel Sarna. Pt has been made aware to arrive 15 minutes early.

## 2020-01-26 ENCOUNTER — Inpatient Hospital Stay: Payer: Medicare PPO | Attending: Genetic Counselor | Admitting: Genetic Counselor

## 2020-01-26 ENCOUNTER — Other Ambulatory Visit: Payer: Self-pay

## 2020-01-26 ENCOUNTER — Inpatient Hospital Stay: Payer: Medicare PPO

## 2020-01-26 ENCOUNTER — Encounter: Payer: Self-pay | Admitting: Genetic Counselor

## 2020-01-26 DIAGNOSIS — Z800 Family history of malignant neoplasm of digestive organs: Secondary | ICD-10-CM | POA: Insufficient documentation

## 2020-01-26 DIAGNOSIS — Z803 Family history of malignant neoplasm of breast: Secondary | ICD-10-CM

## 2020-01-26 DIAGNOSIS — Z801 Family history of malignant neoplasm of trachea, bronchus and lung: Secondary | ICD-10-CM

## 2020-01-26 DIAGNOSIS — Z85048 Personal history of other malignant neoplasm of rectum, rectosigmoid junction, and anus: Secondary | ICD-10-CM | POA: Diagnosis not present

## 2020-01-26 DIAGNOSIS — Z86010 Personal history of colonic polyps: Secondary | ICD-10-CM | POA: Diagnosis not present

## 2020-01-26 NOTE — Progress Notes (Signed)
REFERRING PROVIDER: Jerene Bears, MD [address],  [place] [address]  PRIMARY PROVIDER:  Shirline Frees, MD  PRIMARY REASON FOR VISIT:  1. Personal history of malignant neoplasm of rectum, rectosigmoid junction, and anus   2. Personal history of colonic polyps   3. Family history of colon cancer   4. Family history of breast cancer   5. Family history of lung cancer     HISTORY OF PRESENT ILLNESS:   Mr. Schake, a 79 y.o. male, was seen for a [facility] cancer genetics consultation at the request of Dr. Hilarie Fredrickson due to a personal history of colon polyps and a personal and family history of cancer.  Mr. Sinn presents to clinic today to discuss the possibility of a hereditary predisposition to cancer, genetic testing, and to further clarify his future cancer risks, as well as potential cancer risks for family members.   In 1992, at the age of 10, Mr. Iyer was diagnosed with colon cancer. He also has a history of multiple adenomatous colorectal polyps. He has had a total of 15-20 adenomatous polyps over the years, with most identified on his most recent colonoscopy.  CANCER HISTORY:  Oncology History   No history exists.    Past Medical History:  Diagnosis Date  . BPH (benign prostatic hyperplasia)   . CAD (coronary artery disease)    s/p CABG 2007; NSTEMI in setting of AFib with RVR 12/2009; cath 1/11: S-RCA ok with prox 30-40% and 50% stenoses; S-OM and RI  with patent OM limb but an occluded RI limb; S-D2 and L-LAD ok; EF 55%  . Colon cancer ([facility]) 1992  . Diverticulosis   . Essential hypertension   . Family history of breast cancer   . Family history of colon cancer   . Family history of lung cancer   . GERD (gastroesophageal reflux disease)   . History of pulmonary embolus (PE) 2007   Following CABG  . Hyperlipidemia   . 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 OSA on CPAP   . Paroxysmal atrial fibrillation (H[facility])    Previously on Multaq, declines  anticoagulation  . Personal history of colonic polyps 01/22/2000   Tubular adenoma  . RBBB (right bundle branch block)     Past Surgical History:  Procedure Laterality Date  . COLECTOMY  1992   w/ colostomy  related to colon cancer  . CORONARY ARTERY BYPASS GRAFT  2007  . CORONARY STENT INTERVENTION N/A 10/07/2018   Procedure: CORONARY STENT INTERVENTION;  Surgeon: Adrian Prows, MD;  Location: [facility];  Service: Cardiovascular;  Laterality: N/A;  . HAND [place]   tendon repair and nerve; left  . INGUINAL HERNIA REPAIR  2009  . LAPAROSCOPIC CHOLECYSTECTOMY  2004  . RIGHT/LEFT HEART CATH AND CORONARY/GRAFT ANGIOGRAPHY N/A 07/03/2017   Procedure: Right/Left Heart Cath and Coronary/Graft Angiography;  Surgeon: Leonie Man, MD;  Location: [facility];  Service: Cardiovascular;  Laterality: N/A;  . RIGHT/LEFT HEART CATH AND CORONARY/GRAFT ANGIOGRAPHY N/A 10/07/2018   Procedure: RIGHT/LEFT HEART CATH AND CORONARY/GRAFT ANGIOGRAPHY;  Surgeon: Adrian Prows, MD;  Location: [facility];  Service: Cardiovascular;  Laterality: N/A;  . VENTRAL HERNIA REPAIR  2009    Social History   Socioeconomic History  . Marital status: Married    Spouse name: Not on file  . Number of children: 4  . Years of education: Not on file  . Highest education level:  Not on file  Occupational History  . Occupation: Retired    Comment: Surveyor, minerals   Tobacco Use  . Smoking status: Former Smoker    Packs/day: 1.00    Years: 45.00    Pack years: 45.00    Types: Cigarettes    Quit date: 12/18/1995    Years since quitting: 24.1  . Smokeless tobacco: Never Used  Substance and Sexual Activity  . Alcohol use: No    Comment: quit in 1980  . Drug use: No  . Sexual activity: Not on file  Other Topics Concern  . Not on file  Social History Narrative   Daily caffeine    Social Determinants of Health   Financial Resource Strain:   . Difficulty of Paying  Living Expenses: Not on file  Food Insecurity:   . Worried About Charity fundraiser in the Last Year: Not on file  . Ran Out of Food in the Last Year: Not on file  Transportation Needs:   . Lack of Transportation (Medical): Not on file  . Lack of Transportation (Non-Medical): Not on file  Physical Activity:   . Days of Exercise per Week: Not on file  . Minutes of Exercise per Session: Not on file  Stress:   . Feeling of Stress : Not on file  Social Connections:   . Frequency of Communication with Friends and Family: Not on file  . Frequency of Social Gatherings with Friends and Family: Not on file  . Attends Religious Services: Not on file  . Active Member of Clubs or Organizations: Not on file  . Attends Archivist Meetings: Not on file  . Marital Status: Not on file     FAMILY HISTORY:  We obtained a detailed, 4-generation family history.  Significant diagnoses are listed below: Family History  Problem Relation Age of Onset  . Lung cancer Mother   . Heart disease Mother   . Emphysema Father   . Heart disease Father   . Cancer Sister 64       unknown type  . Breast cancer Sister        dx. >50  . Colon cancer Sister        dx. >50  . Breast cancer Sister        dx. >50  . Colon cancer Sister        dx. >50  . Colon cancer Cousin        dx. early 25s  . Esophageal cancer Neg Hx   . Rectal cancer Neg Hx   . Stomach cancer Neg Hx    Mr. Newberry has one son (age 70) and three daughters (ages 41, 52, and 91). None of his children have had cancer, and he believes that they have all had colonoscopies. He has three sisters. One sister died from cancer when she was 68, although Mr. Willmann is not sure what type of cancer. His other two sisters are currently living at age 4 and 89, and both of these sisters have had both breast and colon cancer diagnosed when they were older than 93. He is not sure if any of his sisters have had colon polyps.  Mr. Wissinger's mother died  at age 71 from lung cancer and had a history of smoking. He is not sure if his mother had colon polyps. He had three maternal uncles who died when they were older than 53, and he is not sure if any of them had cancer.  Mr. Sanon does not know how old his maternal grandparents were when they died or if they had cancer.  Mr. Rigaud's father died at age 86 and did not have cancer. He is not sure if his father had colon polyps. He had one paternal uncle and seven paternal aunts. They all died when they were older than 8, and he is not sure if any of them had cancer. He does know of one paternal cousin who died from colon cancer in his early 39s. Mr. Vecchione is not sure how old his paternal grandparents were when they died or if they had cancer.  Mr. Gowdy is unaware of previous family history of genetic testing for hereditary cancer risks. His ancestry is unknown. There is no reported Ashkenazi Jewish ancestry. There is no known consanguinity.  GENETIC COUNSELING ASSESSMENT: Mr. Tortorella is a 79 y.o. male with a personal history of more than 10 adenomatous colon polyps as well as a personal and family history of colon cancer, which is somewhat suggestive of a hereditary cancer syndrome and predisposition to cancer. We, therefore, discussed and recommended the following at today's visit.   DISCUSSION: We discussed that polyps in general are common, however, most people have fewer than 5 lifetime polyps.  When an individual has 10 or more polyps we become concerned about an underlying polyposis syndrome.  The most common hereditary polyposis syndromes are Familial Adenomatous Polyposis (FAP), caused by mutations in the APC gene, and MUTYH-Associated Polyposis (MAP), caused by mutations in the MUTYH gene.  There are other genes that are associated with polyposis, such as NTHL1 and MSH3.  There are also genes that associated with hereditary colon cancer but not polyposis, such as the Lynch syndrome genes. We  discussed that testing is beneficial for several reasons, including knowing about cancer risks, identifying potential screening and risk-reduction options that may be appropriate, and to understand if other family members could be at risk for colon polyps and/or cancer and allow them to undergo genetic testing.  We reviewed the characteristics, features and inheritance patterns of hereditary cancer syndromes. We also discussed genetic testing, including the appropriate family members to test, the process of testing, insurance coverage and turn-around-time for results. We discussed the implications of a negative, positive and/or variant of uncertain significant result. We recommended Mr. Malachowski pursue genetic testing for the [facility] CustomNext-Cancer + RNAinsight gene panel.  The CustomNext-Cancer+RNAinsight panel offered by [facility] includes sequencing and rearrangement analysis for up to 91 genes, which will include the following 47 genes for Mr. Manternach:  APC*, ATM*, AXIN2, BARD1, BMPR1A, BRCA1*, BRCA2*, BRIP1*, CDH1*, CDK4, CDKN2A, CHEK2*, DICER1, EPCAM, GREM1, HOXB13, MEN1, MLH1*, MSH2*, MSH3, MSH6*, MUTYH*, NBN, NF1*, NF2, NTHL1, PALB2*, PMS2*, POLD1, POLE, PTEN*, RAD51C*, RAD51D*, RECQL, RET, SDHA, SDHAF2, SDHB, SDHC, SDHD, SMAD4, SMARCA4, STK11, TP53*, TSC1, TSC2, and VHL.  DNA and RNA analyses performed for * genes.    Based on Mr. Gange's personal history of more than 10 adenomatous colon polyps as well as his personal and family history of colon cancer, he meets medical criteria for genetic testing. Despite that he meets criteria, he may still have an out of pocket cost. We discussed that if his out of pocket cost for testing is over $100, the laboratory will call and confirm whether he wants to proceed with testing.  If the out of pocket cost of testing is less than $100 he will be billed by the genetic testing laboratory.   PLAN: After considering the risks, benefits, and  limitations, Mr.  Zwilling provided informed consent to pursue genetic testing and the blood sample was sent to [facility] for analysis of the CustomNext-Cancer + RNAinsight gene panel. Results should be available within approximately two-three weeks' time, at which point they will be disclosed by telephone to Mr. Piccirilli, as will any additional recommendations warranted by these results. Mr. Brosh will receive a summary of his genetic counseling visit and a copy of his results once available. This information will also be available in Epic.   Mr. Dashner questions were answered to his satisfaction today. Our contact information was provided should additional questions or concerns arise. Thank you for the referral and allowing [place] to share in the care of your patient.   Clint Guy, MS, [facility] Genetic Counselor [email or web address] Phone: [phone]  The patient was seen for a total of 30 minutes in face-to-face genetic counseling.  This patient was discussed with Drs. Magrinat, Lindi Adie and/or Burr Medico who agrees with the above.    _______________________________________________________________________ For Office Staff:  Number of people involved in session: 1 Was an Intern/ student involved with case: no

## 2020-02-12 ENCOUNTER — Telehealth: Payer: Self-pay | Admitting: Genetic Counselor

## 2020-02-12 NOTE — Telephone Encounter (Signed)
Attempted to call regarding genetic test results. Unable to leave message because the line was busy.

## 2020-02-17 ENCOUNTER — Telehealth: Payer: Self-pay | Admitting: Genetic Counselor

## 2020-02-17 ENCOUNTER — Encounter: Payer: Self-pay | Admitting: Genetic Counselor

## 2020-02-17 DIAGNOSIS — Z1501 Genetic susceptibility to malignant neoplasm of breast: Secondary | ICD-10-CM | POA: Insufficient documentation

## 2020-02-17 DIAGNOSIS — Z1379 Encounter for other screening for genetic and chromosomal anomalies: Secondary | ICD-10-CM | POA: Insufficient documentation

## 2020-02-17 NOTE — Telephone Encounter (Signed)
Disclosed positive genetic test results. 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 Discussed that mutations in the BRIP1 gene are associated with an increased risk for ovarian cancer in women. We will schedule a follow-up appointment to review these results, although [name] would like for his daughter to be present for the discussion. He will determine her availability and call back to schedule the follow-up appointment.   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 The presence of this variant should not impact his medical management.

## 2020-02-17 NOTE — Telephone Encounter (Signed)
[name] called to schedule follow-up genetic counseling appointment for his positive genetic test results. Scheduled a walk-in appointment on 02/23/20 at 3pm. He will be accompanied by his daughter.

## 2020-02-23 ENCOUNTER — Inpatient Hospital Stay: Payer: Medicare PPO | Attending: Genetic Counselor | Admitting: Genetic Counselor

## 2020-02-23 ENCOUNTER — Encounter: Payer: Self-pay | Admitting: Genetic Counselor

## 2020-02-23 ENCOUNTER — Other Ambulatory Visit: Payer: Self-pay

## 2020-02-23 DIAGNOSIS — Z85038 Personal history of other malignant neoplasm of large intestine: Secondary | ICD-10-CM | POA: Diagnosis not present

## 2020-02-23 DIAGNOSIS — Z803 Family history of malignant neoplasm of breast: Secondary | ICD-10-CM

## 2020-02-23 DIAGNOSIS — Z1379 Encounter for other screening for genetic and chromosomal anomalies: Secondary | ICD-10-CM

## 2020-02-23 DIAGNOSIS — Z801 Family history of malignant neoplasm of trachea, bronchus and lung: Secondary | ICD-10-CM

## 2020-02-23 DIAGNOSIS — Z86010 Personal history of colonic polyps: Secondary | ICD-10-CM | POA: Diagnosis not present

## 2020-02-23 DIAGNOSIS — Z809 Family history of malignant neoplasm, unspecified: Secondary | ICD-10-CM

## 2020-02-23 DIAGNOSIS — Z1501 Genetic susceptibility to malignant neoplasm of breast: Secondary | ICD-10-CM | POA: Diagnosis not present

## 2020-02-23 DIAGNOSIS — Z1589 Genetic susceptibility to other disease: Secondary | ICD-10-CM | POA: Diagnosis not present

## 2020-02-23 NOTE — Progress Notes (Addendum)
GENETIC TEST RESULTS   Patient Name: [name] Patient Age: 79 y.o. Encounter Date: 02/23/2020  Referring Provider: Jerene Bears, MD [address],  [place] [address]   Mr. Ciresi was seen in the [facility] clinic on 01/26/2020 due to a personal history of colon polyps and colon cancer, as well as a family history of cancer, and concern regarding a hereditary predisposition to cancer in the family. Please refer to the prior Genetics clinic note for more information regarding Mr. Burnley's medical and family histories and our assessment at the time.   FAMILY HISTORY:  We obtained a detailed, 4-generation family history.  Significant diagnoses are listed below: Family History  Problem Relation Age of Onset  . Lung cancer Mother   . Heart disease Mother   . Emphysema Father   . Heart disease Father   . Cancer Sister 72       unknown type  . Breast cancer Sister        dx. >50  . Colon cancer Sister        dx. >50  . Breast cancer Sister        dx. >50  . Colon cancer Sister        dx. >50  . Colon cancer Cousin        dx. early 31s  . Esophageal cancer Neg Hx   . Rectal cancer Neg Hx   . Stomach cancer Neg Hx    Mr. Wygant has one son (age 2) and three daughters (ages 48, 39, and 81). None of his children have had cancer, and he believes that they have all had colonoscopies. He has three sisters. One sister died from cancer when she was 13, although Mr. Braziel is not sure what type of cancer. His other two sisters are currently living at age 47 and 12, and both of these sisters have had both breast and colon cancer diagnosed when they were older than 66. He is not sure if any of his sisters have had colon polyps.  Mr. Holleman's mother died at age 54 from lung cancer and had a history of smoking. He is not sure if his mother had colon polyps. He had three maternal uncles who died when they were older than 54, and he is not sure if any of them had cancer. Mr. Cindric  does not know how old his maternal grandparents were when they died or if they had cancer.  Mr. Silbaugh's father died at age 72 and did not have cancer. He is not sure if his father had colon polyps. He had one paternal uncle and seven paternal aunts. They all died when they were older than 23, and he is not sure if any of them had cancer. He does know of one paternal cousin who died from colon cancer in his early 33s. Mr. Haydu is not sure how old his paternal grandparents were when they died or if they had cancer.  Mr. Haden is unaware of previous family history of genetic testing for hereditary cancer risks. His ancestry is unknown. There is no reported Ashkenazi Jewish ancestry. There is no known consanguinity.  GENETIC TESTING:  Genetic testing reported out on 02/10/2020 through the [facility] +RNAinsight panel.  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   The CustomNext-Cancer+RNAinsight panel offered by [facility] includes sequencing and rearrangement analysis for up to 91 genes, which included the following 47 genes for Mr.  Paprocki:  APC*, ATM*, AXIN2, BARD1, BMPR1A, BRCA1*, BRCA2*, BRIP1*, CDH1*, CDK4, CDKN2A, CHEK2*, DICER1, EPCAM, GREM1, HOXB13, MEN1, MLH1*, MSH2*, MSH3, MSH6*, MUTYH*, NBN, NF1*, NF2, NTHL1, PALB2*, PMS2*, POLD1, POLE, PTEN*, RAD51C*, RAD51D*, RECQL, RET, SDHA, SDHAF2, SDHB, SDHC, SDHD, SMAD4, SMARCA4, STK11, TP53*, TSC1, TSC2, and VHL.  DNA and RNA analyses performed for * genes.     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  Genetic testing did identify a variant of uncertain significance (VUS) in the BMPR1A gene called c.383A>G (p.N128S).  At this time, it is unknown if this variant is associated with increased cancer risk or if this is a normal finding, but most variants such as this get reclassified to being inconsequential. It should not be used to make  medical management decisions. With time, we suspect the lab will determine the significance of this variant, if any. If we do learn more about it, we will try to contact Mr. Tayler to discuss it further. However, it is important to stay in touch with [place] periodically and keep the address and phone number up to date.  Mr. Holliman's genetic test results do not explain why he has had multiple colon polyps or why he has a personal and family history of cancer. We discussed with Mr. Buckner that because current genetic testing is not perfect, it is possible there may be a gene mutation in one of these genes that current testing cannot detect, but that chance is small.  We also discussed that there could be another gene that has not yet been discovered, or that we have not yet tested, that is responsible for the why he has had multiple colon polyps or the cancer diagnoses in the family. It is also possible there is a hereditary cause for the cancer in the family that Mr. Edgell did not inherit and therefore was not identified in his testing.  Therefore, it is important to remain in touch with cancer genetics in the future so that we can continue to offer Mr. Belinsky the most up to date genetic testing.  This genetic test result simply tells [place] that we cannot yet define why Mr. Sadik has had an increased number of colorectal polyps. Mr. Girdler's GI medical management and screening should be based on the prospect that he will likely form more colon polyps and should, therefore, undergo more frequent colonoscopy screening at intervals determined by his GI providers.  We also recommended that Mr. Dube have an upper endoscopy periodically.  BRIP1 CANCER RISKS & RECOMMENDATIONS:  We discussed that women who are carriers of a single pathogenic variant in the BRIP1 gene have an increased risk of ovarian cancer, and possibly breast cancer. The risk for ovarian cancer in these women is estimated to be up to 9.1% (compared to  the general population ovarian cancer risk of approximately 1.3%). There is preliminary evidence supporting a correlation with BRIP1 and predisposition to breast cancer; however, the available evidence is insufficient to make a determination regarding this relationship. An individual with a BRIP1 pathogenic variant will not necessarily develop cancer in their lifetime, but the risk for cancer is increased over the general population risk.   Based on current understanding, men don't appear to have increased cancer risks due to BRIP1 mutations. However, men can carry the mutation and pass it on to their children. Additionally, information regarding male cancer risk could change as more research is done to understand BRIP1 mutations.  Ovarian Cancer Management:  The  [place] (NCCN) recommends consideration of prophylactic salpingo-oophorectomy (surgical removal of the ovaries and fallopian tubes) for women with a pathogenic variant in [place] after childbearing is complete. The current evidence is insufficient to make a firm recommendation as to the optimal age for this procedure. However, based on the current, limited evidence base, a discussion about surgery should be held around 52-51 years of age or earlier based on a specific family history of early-onset ovarian cancer (Artist. Genetic/Familial High-Risk Assessment: Breast, Ovarian, and Pancreatic; Version 2.2021). Women electing to defer prophylactic oophorectomy can consider screening with serum CA-125 and transvaginal ultrasound; however, data do not support such screening and it should not be a substitute for preventive surgery.   Breast Cancer Management:  The current NCCN guidelines do not recommend additional breast cancer screening for individuals with a single pathogenic BRIP1 variant beyond what is recommended for the general population. However, they caution that cancer screening should  ultimately be guided by personal and family history (Artist. Genetic/Familial High-Risk Assessment: Breast, Ovarian, and Pancreatic; Version 2.2021).  An individual's cancer risk and medical management are not determined by genetic test results alone. Overall cancer risk assessment incorporates additional factors, including personal medical history, family history, and any available genetic information that may result in a personalized plan for cancer prevention and surveillance.  Inheritance & Risk for Family Members:  Hereditary predisposition to cancer due to a single pathogenic variant in the BRIP1 gene has autosomal dominant inheritance. This means that an individual with a pathogenic variant has a 50% chance of passing the condition on to their offspring. Once such a variant is detected in an individual, it is possible to identify at-risk relatives who can pursue testing for this specific familial variant. Many cases are inherited from a parent, but some cases can occur spontaneously (i.e., an individual with a pathogenic variant has parents who do not have it).   It is important that all of Mr. Lollar's relatives (both men and women) know of the presence of this gene mutation.  Women need to know that they may be at increased risk for breast and colon cancers.  Men are at slightly increased risk for breast, prostate and colon cancers.  Genetic testing can sort out who in your family is at risk and who is not.  We would be happy to help meet with and coordinate genetic testing for any relative that is interested.  Mr. Agner children and siblings are at 50% risk to have inherited the mutation found in him. We recommend they have genetic testing for this same mutation, as identifying the presence of this mutation would allow them to also take advantage of risk-reducing measures.   Additionally, individuals with a pathogenic variant in BRIP1 are carriers of Fanconi  anemia type J. Fanconi anemia is an autosomal recessive disorder that is characterized by bone marrow failure and variable presentation of anomalies, including short stature, abnormal skin pigmentation, abnormal thumbs, malformations of the skeletal and central nervous systems, and developmental delay (PMID: 1027253, 66440347). Risks for leukemia and early onset solid tumors are significantly elevated (PMID: 42595638, 75643329, 51884166). For there to be a risk of Fanconi anemia in offspring, both parents would each have to have a single pathogenic variant in [place]; in such a case, the risk of having an affected child is 25%.  Lastly, we discussed that our knowledge of cancer risks related to BRIP1 mutations will continue to evolve. We recommended that Mr. Klump  follow up with the genetics clinic annually so we can provide him with the most current information about BRIP1 and cancer risk, as well as with any changes to his family history (new cancer diagnoses, genetic test results).  Our contact number was provided. Mr. Keep questions were answered to his satisfaction, and he knows he is welcome to call us at anytime with additional questions or concerns.   Clint Guy, [place], [facility] Licensed, Certified Dispensing [email or web address] Phone: [phone]   The patient was seen for a total of 40 minutes in face-to-face genetic counseling.

## 2020-02-27 ENCOUNTER — Other Ambulatory Visit: Payer: Self-pay | Admitting: Pulmonary Disease

## 2020-04-05 ENCOUNTER — Ambulatory Visit: Payer: Medicare PPO | Admitting: Physician Assistant

## 2020-04-05 ENCOUNTER — Encounter: Payer: Self-pay | Admitting: *Deleted

## 2020-04-29 ENCOUNTER — Ambulatory Visit: Payer: Medicare PPO | Admitting: Physician Assistant

## 2020-06-11 ENCOUNTER — Other Ambulatory Visit: Payer: Self-pay | Admitting: Cardiology

## 2020-06-22 DIAGNOSIS — G4733 Obstructive sleep apnea (adult) (pediatric): Secondary | ICD-10-CM | POA: Diagnosis not present

## 2020-06-22 DIAGNOSIS — G259 Extrapyramidal and movement disorder, unspecified: Secondary | ICD-10-CM | POA: Diagnosis not present

## 2020-06-22 DIAGNOSIS — I5032 Chronic diastolic (congestive) heart failure: Secondary | ICD-10-CM | POA: Diagnosis not present

## 2020-06-22 DIAGNOSIS — I10 Essential (primary) hypertension: Secondary | ICD-10-CM | POA: Diagnosis not present

## 2020-06-22 DIAGNOSIS — K219 Gastro-esophageal reflux disease without esophagitis: Secondary | ICD-10-CM | POA: Diagnosis not present

## 2020-06-22 DIAGNOSIS — G8929 Other chronic pain: Secondary | ICD-10-CM | POA: Diagnosis not present

## 2020-06-22 DIAGNOSIS — I2510 Atherosclerotic heart disease of native coronary artery without angina pectoris: Secondary | ICD-10-CM | POA: Diagnosis not present

## 2020-06-22 DIAGNOSIS — E7800 Pure hypercholesterolemia, unspecified: Secondary | ICD-10-CM | POA: Diagnosis not present

## 2020-06-22 DIAGNOSIS — L03115 Cellulitis of right lower limb: Secondary | ICD-10-CM | POA: Diagnosis not present

## 2020-07-28 ENCOUNTER — Other Ambulatory Visit: Payer: Self-pay | Admitting: Nurse Practitioner

## 2020-08-18 ENCOUNTER — Encounter: Payer: Self-pay | Admitting: Genetic Counselor

## 2020-09-07 ENCOUNTER — Other Ambulatory Visit: Payer: Medicare PPO

## 2020-09-07 ENCOUNTER — Other Ambulatory Visit: Payer: Self-pay

## 2020-09-07 DIAGNOSIS — Z20822 Contact with and (suspected) exposure to covid-19: Secondary | ICD-10-CM | POA: Diagnosis not present

## 2020-09-08 ENCOUNTER — Other Ambulatory Visit: Payer: Self-pay | Admitting: Hospice and Palliative Medicine

## 2020-09-08 DIAGNOSIS — U071 COVID-19: Secondary | ICD-10-CM

## 2020-09-08 NOTE — Progress Notes (Signed)
I connected by phone with patient on 09/08/2020 to discuss the potential use of an new treatment for mild to moderate COVID-19 viral infection in non-hospitalized patients.   This patient is a age/sex that meets the FDA criteria for Emergency Use Authorization of casirivimab\imdevimab.  Has a (+) direct SARS-CoV-2 viral test result 1. Has mild or moderate COVID-19  2. Is ? 80 years of age and weighs ? 40 kg 3. Is NOT hospitalized due to COVID-19 4. Is NOT requiring oxygen therapy or requiring an increase in baseline oxygen flow rate due to COVID-19 5. Is within 10 days of symptom onset 6. Has at least one of the high risk factor(s) for progression to severe COVID-19 and/or hospitalization as defined in EUA. ? Specific high risk criteria : age, lung dz   Symptom onset  9/19   I have spoken and communicated the following to the patient or parent/caregiver:   1. FDA has authorized the emergency use of casirivimab\imdevimab for the treatment of mild to moderate COVID-19 in adults and pediatric patients with positive results of direct SARS-CoV-2 viral testing who are 80 years of age and older weighing at least 40 kg, and who are at high risk for progressing to severe COVID-19 and/or hospitalization.   2. The significant known and potential risks and benefits of casirivimab\imdevimab, and the extent to which such potential risks and benefits are unknown.   3. Information on available alternative treatments and the risks and benefits of those alternatives, including clinical trials.   4. Patients treated with casirivimab\imdevimab should continue to self-isolate and use infection control measures (e.g., wear mask, isolate, social distance, avoid sharing personal items, clean and disinfect high touch surfaces, and frequent handwashing) according to CDC guidelines.    5. The patient or parent/caregiver has the option to accept or refuse casirivimab\imdevimab .   After reviewing this information  with the patient, The patient agreed to proceed with receiving casirivimab\imdevimab infusion and will be provided a copy of the Fact sheet prior to receiving the infusion.Altha Harm, PhD, NP-C [phone] ([place])

## 2020-09-09 ENCOUNTER — Other Ambulatory Visit (HOSPITAL_COMMUNITY): Payer: Self-pay

## 2020-09-09 ENCOUNTER — Ambulatory Visit (HOSPITAL_COMMUNITY)
Admission: RE | Admit: 2020-09-09 | Discharge: 2020-09-09 | Disposition: A | Discharge status: Home / Self Care | Payer: Medicare Other | Source: Ambulatory Visit | Attending: Pulmonary Disease | Admitting: Pulmonary Disease

## 2020-09-09 DIAGNOSIS — U071 COVID-19: Secondary | ICD-10-CM | POA: Diagnosis present

## 2020-09-09 DIAGNOSIS — Z23 Encounter for immunization: Secondary | ICD-10-CM | POA: Diagnosis not present

## 2020-09-09 LAB — NOVEL CORONAVIRUS, NAA: SARS-CoV-2, NAA: DETECTED — AB

## 2020-09-09 LAB — SPECIMEN STATUS REPORT

## 2020-09-09 LAB — SARS-COV-2, NAA 2 DAY TAT

## 2020-09-09 MED ORDER — EPINEPHRINE 0.3 MG/0.3ML IJ SOAJ: 0.3000 mg | Freq: Once | INTRAMUSCULAR | Status: DC | PRN

## 2020-09-09 MED ORDER — METHYLPREDNISOLONE SODIUM SUCC 125 MG IJ SOLR: 125.0000 mg | Freq: Once | INTRAMUSCULAR | Status: DC | PRN

## 2020-09-09 MED ORDER — SODIUM CHLORIDE 0.9 % IV SOLN
1200.0000 mg | Freq: Once | INTRAVENOUS | Status: AC
  Administered 2020-09-09: 1200 mg via INTRAVENOUS

## 2020-09-09 MED ORDER — FAMOTIDINE IN NACL 20-0.9 MG/50ML-% IV SOLN: 20.0000 mg | Freq: Once | INTRAVENOUS | Status: DC | PRN

## 2020-09-09 MED ORDER — ALBUTEROL SULFATE HFA 108 (90 BASE) MCG/ACT IN AERS: 2.0000 | INHALATION_SPRAY | Freq: Once | RESPIRATORY_TRACT | Status: DC | PRN

## 2020-09-09 MED ORDER — DIPHENHYDRAMINE HCL 50 MG/ML IJ SOLN: 50.0000 mg | Freq: Once | INTRAMUSCULAR | Status: DC | PRN

## 2020-09-09 MED ORDER — SODIUM CHLORIDE 0.9 % IV SOLN: INTRAVENOUS | Status: DC | PRN

## 2020-09-09 NOTE — Discharge Instructions (Signed)

## 2020-09-09 NOTE — Progress Notes (Signed)
  Diagnosis: COVID-19  Physician: Asencion Noble, MD  Procedure: [facility] Med: casirivimab\imdevimab infusion - Provided patient with casirivimab\imdevimab fact sheet for patients, parents and caregivers prior to infusion.  Complications: No immediate complications noted.  Discharge: Discharged home   Janne Napoleon 09/09/2020

## 2020-09-18 HISTORY — PX: COLONOSCOPY: SHX174

## 2020-09-30 DIAGNOSIS — M25551 Pain in right hip: Secondary | ICD-10-CM | POA: Diagnosis not present

## 2020-09-30 DIAGNOSIS — Z23 Encounter for immunization: Secondary | ICD-10-CM | POA: Diagnosis not present

## 2020-09-30 DIAGNOSIS — J4521 Mild intermittent asthma with (acute) exacerbation: Secondary | ICD-10-CM | POA: Diagnosis not present

## 2020-10-04 ENCOUNTER — Ambulatory Visit (INDEPENDENT_AMBULATORY_CARE_PROVIDER_SITE_OTHER): Payer: Medicare PPO | Admitting: Emergency Medicine

## 2020-10-04 ENCOUNTER — Other Ambulatory Visit: Payer: Self-pay

## 2020-10-04 ENCOUNTER — Ambulatory Visit (INDEPENDENT_AMBULATORY_CARE_PROVIDER_SITE_OTHER): Payer: Medicare PPO

## 2020-10-04 ENCOUNTER — Encounter: Payer: Self-pay | Admitting: Emergency Medicine

## 2020-10-04 VITALS — BP 128/70 | HR 94 | Temp 97.6°F | Ht 71.0 in | Wt 238.0 lb

## 2020-10-04 DIAGNOSIS — J8410 Pulmonary fibrosis, unspecified: Secondary | ICD-10-CM | POA: Diagnosis not present

## 2020-10-04 DIAGNOSIS — J1282 Pneumonia due to coronavirus disease 2019: Secondary | ICD-10-CM | POA: Diagnosis not present

## 2020-10-04 DIAGNOSIS — U071 COVID-19: Secondary | ICD-10-CM

## 2020-10-04 DIAGNOSIS — Z8616 Personal history of COVID-19: Secondary | ICD-10-CM

## 2020-10-04 DIAGNOSIS — J449 Chronic obstructive pulmonary disease, unspecified: Secondary | ICD-10-CM | POA: Diagnosis not present

## 2020-10-04 DIAGNOSIS — S2242XA Multiple fractures of ribs, left side, initial encounter for closed fracture: Secondary | ICD-10-CM | POA: Diagnosis not present

## 2020-10-04 DIAGNOSIS — I700 Atherosclerosis of aorta: Secondary | ICD-10-CM | POA: Diagnosis not present

## 2020-10-04 DIAGNOSIS — G4733 Obstructive sleep apnea (adult) (pediatric): Secondary | ICD-10-CM

## 2020-10-04 MED ORDER — STIOLTO RESPIMAT 2.5-2.5 MCG/ACT IN AERS: INHALATION_SPRAY | RESPIRATORY_TRACT | 11 refills | Status: DC

## 2020-10-04 MED ORDER — ALBUTEROL SULFATE HFA 108 (90 BASE) MCG/ACT IN AERS: 2.0000 | INHALATION_SPRAY | Freq: Four times a day (QID) | RESPIRATORY_TRACT | 11 refills | Status: DC | PRN

## 2020-10-04 NOTE — Patient Instructions (Addendum)
CXR today We will continue your Stiolto 2 puffs once a day.  Keep your albuterol available to use either 1 nebulizer treatment or 2 puffs when you need it for shortness of breath, chest tightness, wheezing.  We will refill your albuterol inhaler today. Take your doxycycline as prescribed by Dr. Kenton Kingfisher until it is completely gone. Follow with Dr Lamonte Sakai in 6 months or sooner if you have any problems

## 2020-10-04 NOTE — Assessment & Plan Note (Signed)
Not currently using CPAP.

## 2020-10-04 NOTE — Assessment & Plan Note (Signed)
Some more labile symptoms especially since he is COVID-19 diagnosis.  Also influenced by the fact that he just ran out of his Stiolto last week.  Need to get him back on this.  He has albuterol nebs but does not have an HFA, we will fill this for him.  He is being treated for a pharyngitis with doxycycline.  I suspect that his exertional tolerance, COPD symptoms will improve when he is back on his Stiolto and after he is fully recovered from Covid.  If he continues to have residual dyspnea then we could consider repeat pulmonary function testing.  We will check a chest x-ray to ensure no interstitial disease post Covid.

## 2020-10-04 NOTE — Assessment & Plan Note (Signed)
Diagnosed 09/07/2020.  He was treated with monoclonal antibody and improved.  He does not feel quite back to baseline.  He needs a chest x-ray today to assess for residual interstitial changes.  Follow him radiographically depending on the results.

## 2020-10-04 NOTE — Progress Notes (Signed)
Subjective:    Patient ID: [name], male    DOB: 08/21/1941, 79 y.o.   MRN: [id number]  Shortness of Breath Pertinent negatives include no ear pain, fever, headaches, leg swelling, rash, rhinorrhea, sore throat, vomiting or wheezing.   ROV 07/21/2019 --79 year old man with mild COPD, obstructive sleep apnea.  He also has coronary disease, A. fib.  I continued to Stiolto at last visit.  He reports that he has been taking it prn - helps his breathing and secretion clearance.  He stopped using his CPAP machine - has been dealing with a lot of dry mouth, uses nasal mask w a chin strap.  He has stopped loratadine.  Last visit we added fluticasone nasal spray, using prn.   ROV 10/04/2020 --follow-up visit for 79 year old gentleman with mild COPD and obstructive sleep apnea, coronary artery disease, A. fib.  He also has allergic rhinitis.  He unfortunately had COVID-19 pneumonia, dx 09/07/20. He received the monoclonal Ab. He does feel better - still having some wheeze, some residual SOB. Minimal cough, gets a bit of mucous in his throat. He was treated for pharyngitis w doxycycline by Dr Kenton Kingfisher last Friday. He ran out of Stiolto last week. He has albuterol nebs, needs an HFA. He has used nebs about once a day since COVID, formerly used very little. He is no longer using CPAP.    Review of Systems  Constitutional: Negative for fatigue, fever and unexpected weight change.  HENT: Negative for congestion, dental problem, ear pain, nosebleeds, postnasal drip, rhinorrhea, sinus pressure, sneezing, sore throat and trouble swallowing.   Eyes: Negative for redness and itching.  Respiratory: Negative for cough, chest tightness, shortness of breath and wheezing.   Cardiovascular: Negative for palpitations and leg swelling.  Gastrointestinal: Negative for nausea and vomiting.  Genitourinary: Negative for dysuria.  Musculoskeletal: Negative for joint swelling and myalgias.  Skin: Negative for rash.   Neurological: Negative for headaches.  Hematological: Does not bruise/bleed easily.  Psychiatric/Behavioral: Negative for dysphoric mood. The patient is not nervous/anxious.        Objective:   Physical Exam Vitals:   10/04/20 1632  BP: 128/70  Pulse: 94  Temp: 97.6 F (36.4 C)  TempSrc: Temporal  SpO2: 92%  Weight: 238 lb (108 kg)  Height: 5\' 11"  (1.803 m)   Gen: Pleasant, obese, in no distress,  normal affect  ENT: No lesions,  mouth clear,  oropharynx clear, no postnasal drip  Neck: No JVD, no stridor  Lungs: No use of accessory muscles, no wheeze, decreased at bases  Cardiovascular:  Regular, no evidence a fib currently   Musculoskeletal: amputation R index finger, no cyanosis or clubbing  Neuro: alert, non focal  Skin: Warm, no lesions or rashes     Assessment & Plan:  COPD (chronic obstructive pulmonary disease) (HCC) Some more labile symptoms especially since he is COVID-19 diagnosis.  Also influenced by the fact that he just ran out of his Stiolto last week.  Need to get him back on this.  He has albuterol nebs but does not have an HFA, we will fill this for him.  He is being treated for a pharyngitis with doxycycline.  I suspect that his exertional tolerance, COPD symptoms will improve when he is back on his Stiolto and after he is fully recovered from Covid.  If he continues to have residual dyspnea then we could consider repeat pulmonary function testing.  We will check a chest x-ray to ensure no interstitial disease post  Covid.  Obstructive sleep apnea Not currently using CPAP  Pneumonia due to COVID-19 virus Diagnosed 09/07/2020.  He was treated with monoclonal antibody and improved.  He does not feel quite back to baseline.  He needs a chest x-ray today to assess for residual interstitial changes.  Follow him radiographically depending on the results.  Baltazar Apo, MD, PhD 10/04/2020, 5:05 PM [facility] [phone] or if no answer  [phone]

## 2020-10-12 ENCOUNTER — Ambulatory Visit: Payer: Medicare PPO | Admitting: Cardiology

## 2020-10-12 ENCOUNTER — Encounter: Payer: Self-pay | Admitting: Cardiology

## 2020-10-12 ENCOUNTER — Other Ambulatory Visit: Payer: Self-pay

## 2020-10-12 VITALS — BP 112/74 | HR 91 | Resp 16 | Ht 71.0 in | Wt 238.0 lb

## 2020-10-12 DIAGNOSIS — I2510 Atherosclerotic heart disease of native coronary artery without angina pectoris: Secondary | ICD-10-CM | POA: Diagnosis not present

## 2020-10-12 DIAGNOSIS — R0609 Other forms of dyspnea: Secondary | ICD-10-CM | POA: Diagnosis not present

## 2020-10-12 DIAGNOSIS — R0600 Dyspnea, unspecified: Secondary | ICD-10-CM

## 2020-10-12 DIAGNOSIS — E782 Mixed hyperlipidemia: Secondary | ICD-10-CM | POA: Diagnosis not present

## 2020-10-12 DIAGNOSIS — I712 Thoracic aortic aneurysm, without rupture, unspecified: Secondary | ICD-10-CM

## 2020-10-12 NOTE — Progress Notes (Signed)
Primary Physician:  Shirline Frees, MD   Patient ID: [name], male    DOB: June 11, 1941, 79 y.o.   MRN: [id number]  Subjective:    Chief Complaint  Patient presents with  . Follow-up    1 year  . AAA  . Coronary Artery Disease    HPI: [name]  is a 79 y.o. male  with known coronary artery disease and CABG in 2007 in the setting of non-STEMI in atrial fibrillation with RVR.He has a history of  45 pack year h/o smoking tobacco, ascending aortic aneurysm (follows Dr.Bryan Bartle), small abdominal aortic aneurysm noted in 2015 (3cm), H/O colon cancer, in remission since 1992, GERD, hyperlipidemia, obstructive sleep apnea not using CPAP.  He presents for his annual visit, states that over the past few months his activity has been markedly limited due to left hip pain and thinks that he needs left hip replacement.  Is accompanied by his wife.  He has not used any sublingual nitroglycerin recently, no change in his weight, dyspnea has remained stable, no PND or orthopnea or leg edema.  Past Medical History:  Diagnosis Date  . BPH (benign prostatic hyperplasia)   . CAD (coronary artery disease)    s/p CABG 2007; NSTEMI in setting of AFib with RVR 12/2009; cath 1/11: S-RCA ok with prox 30-40% and 50% stenoses; S-OM and RI  with patent OM limb but an occluded RI limb; S-D2 and L-LAD ok; EF 55%  . Colon cancer ([place]) 1992  . Diverticulosis   . Essential hypertension   . Family history of breast cancer   . Family history of colon cancer   . Family history of lung cancer   . GERD (gastroesophageal reflux disease)   . History of pulmonary embolus (PE) 2007   Following CABG  . Hyperlipidemia   . 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 OSA on CPAP   . Paroxysmal atrial fibrillation (HCC)    Previously on Multaq, declines anticoagulation  . Personal history of colonic polyps 01/22/2000   Tubular adenoma  . RBBB (right bundle branch block)   . Squamous cell carcinoma of skin  02/11/2008   Bowens-Left paraspinal, lower   . 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   in situ-left forearm    Past Surgical History:  Procedure Laterality Date  . COLECTOMY  1992   w/ colostomy  related to colon cancer  . CORONARY ARTERY BYPASS GRAFT  2007  . CORONARY STENT INTERVENTION N/A 10/07/2018   Procedure: CORONARY STENT INTERVENTION;  Surgeon: Adrian Prows, MD;  Location: [facility];  Service: Cardiovascular;  Laterality: N/A;  . HAND [place]   tendon repair and nerve; left  . INGUINAL HERNIA REPAIR  2009  . LAPAROSCOPIC CHOLECYSTECTOMY  2004  . RIGHT/LEFT HEART CATH AND CORONARY/GRAFT ANGIOGRAPHY N/A 07/03/2017   Procedure: Right/Left Heart Cath and Coronary/Graft Angiography;  Surgeon: Leonie Man, MD;  Location: [facility];  Service: Cardiovascular;  Laterality: N/A;  . RIGHT/LEFT HEART CATH AND CORONARY/GRAFT ANGIOGRAPHY N/A 10/07/2018   Procedure: RIGHT/LEFT HEART CATH AND CORONARY/GRAFT ANGIOGRAPHY;  Surgeon: Adrian Prows, MD;  Location: [facility];  Service: Cardiovascular;  Laterality: N/A;  . VENTRAL HERNIA REPAIR  2009   Social History   Tobacco Use  . Smoking status: Former Smoker    Packs/day: 1.00    Years: 45.00    Pack years: 45.00    Types: Cigarettes  Quit date: 12/18/1995    Years since quitting: 24.8  . Smokeless tobacco: Never Used  Substance Use Topics  . Alcohol use: No    Comment: quit in 1980   Marital Status: Married \\   Review of Systems  Constitutional: Negative for decreased appetite, malaise/fatigue, weight gain and weight loss.  Eyes: Negative for visual disturbance.  Cardiovascular: Positive for dyspnea on exertion and palpitations. Negative for chest pain, claudication, leg swelling, orthopnea and syncope.  Respiratory: Negative for hemoptysis and wheezing.   Endocrine: Negative for cold intolerance and heat intolerance.  Hematologic/Lymphatic: Does not bruise/bleed easily.   Skin: Negative for nail changes.  Musculoskeletal: Positive for back pain and joint pain (knee). Negative for muscle weakness and myalgias.  Gastrointestinal: Negative for abdominal pain, change in bowel habit, nausea and vomiting.  Neurological: Negative for difficulty with concentration, dizziness, focal weakness and headaches.  Psychiatric/Behavioral: Negative for altered mental status and suicidal ideas.  All other systems reviewed and are negative.  Objective:  Blood pressure 112/74, pulse 91, resp. rate 16, height 5\' 11"  (1.803 m), weight 238 lb (108 kg), SpO2 94 %. Body mass index is 33.19 kg/m.  Vitals with BMI 10/12/2020 10/04/2020 09/09/2020  Height 5\' 11"  5\' 11"  -  Weight 238 lbs 238 lbs -  BMI 36.14 43.15 -  Systolic 400 867 619  Diastolic 74 70 68  Pulse 91 94 85    Physical Exam Vitals reviewed.  Constitutional:      Appearance: He is well-developed.  HENT:     Head: Normocephalic and atraumatic.  Cardiovascular:     Rate and Rhythm: Normal rate and regular rhythm.     Pulses: Intact distal pulses.          Femoral pulses are 2+ on the right side and 2+ on the left side.      Popliteal pulses are 1+ on the right side and 1+ on the left side.       Dorsalis pedis pulses are 1+ on the right side and 1+ on the left side.       Posterior tibial pulses are 2+ on the right side and 2+ on the left side.     Heart sounds: Normal heart sounds.     Comments: No edema Pulmonary:     Effort: Pulmonary effort is normal. No accessory muscle usage or respiratory distress.     Breath sounds: Normal breath sounds.  Abdominal:     General: Bowel sounds are normal.     Palpations: Abdomen is soft.  Musculoskeletal:        General: Normal range of motion.     Cervical back: Normal range of motion.  Skin:    General: Skin is warm and dry.  Neurological:     Mental Status: He is alert and oriented to person, place, and time.    Radiology:  CTA of chest 09/16/2019:  4.4 cm  ascending thoracic aortic aneurysm. Recommend annual imaging followup by CTA or MRA. This recommendation follows 2010 ACCF/AHA/AATS/ACR/ASA/SCA/SCAI/SIR/STS/SVM Guidelines for the Diagnosis and Management of Patients with Thoracic Aortic Disease. Circulation. 2010; 121: J093-O671. Aortic aneurysm NOS (ICD10-I71.9)  Old granulomatous disease. No acute cardiopulmonary disease. Aortic Atherosclerosis (ICD10-I70.0).  Laboratory examination:   External Labs:  Cholesterol, total 128.000 m 06/22/2020 HDL 34.000 mg 06/22/2020 LDL-C 63.000 mg 06/22/2020 Triglycerides 183.000 m 06/22/2020  Hemoglobin 14.900 g/d 06/22/2020  Creatinine, Serum 1.250 mg/ 06/22/2020 Potassium 3.900 mm 06/22/2020 ALT (SGPT) 30.000 U/L 06/22/2020  TSH 2.120 06/22/2020  Current Outpatient Medications on File Prior to Visit  Medication Sig Dispense Refill  . albuterol (PROVENTIL HFA;VENTOLIN HFA) 108 (90 Base) MCG/ACT inhaler Inhale 2 puffs into the lungs every 4 (four) hours as needed for wheezing or shortness of breath. 1 Inhaler 5  . albuterol (PROVENTIL) (2.5 MG/3ML) 0.083% nebulizer solution USE 1 VIAL IN NEBULIZER EVERY 6 HOURS AS NEEDED FOR WHEEZING FOR SHORTNESS OF BREATH 75 mL 0  . albuterol (VENTOLIN HFA) 108 (90 Base) MCG/ACT inhaler Inhale 2 puffs into the lungs every 6 (six) hours as needed for wheezing or shortness of breath. 8 g 11  . aspirin EC 81 MG tablet Take 1 tablet (81 mg total) by mouth daily. 30 tablet 3  . atorvastatin (LIPITOR) 20 MG tablet Take 20 mg by mouth at bedtime.   5  . fluticasone (FLONASE) 50 MCG/ACT nasal spray Place 2 sprays into both nostrils daily. 16 g 2  . furosemide (LASIX) 20 MG tablet Take 1 tablet (20 mg total) by mouth daily. (Patient taking differently: Take 40 mg by mouth daily. ) 30 tablet 3  . gabapentin (NEURONTIN) 100 MG capsule Take 100 mg by mouth 2 (two) times daily.    . Garlic 7253 MG CAPS Take 1,000 mg by mouth daily.    Marland Kitchen guaiFENesin (MUCINEX) 600 MG 12 hr tablet Take 600  mg by mouth 2 (two) times daily as needed.     Marland Kitchen HYDROcodone-acetaminophen (NORCO/VICODIN) 5-325 MG tablet Take 1 tablet by mouth every 6 (six) hours as needed for moderate pain.    . isosorbide mononitrate (IMDUR) 30 MG 24 hr tablet Take 1 tablet by mouth once daily 90 tablet 1  . ketoconazole (NIZORAL) 2 % cream Apply 1 application topically daily.    . metoprolol succinate (TOPROL-XL) 50 MG 24 hr tablet Take 1 tablet by mouth twice daily (Patient taking differently: daily. ) 180 tablet 0  . Multiple Vitamin (MULTIVITAMIN WITH MINERALS) TABS tablet Take 1 tablet by mouth daily.    . nitroGLYCERIN (NITROSTAT) 0.4 MG SL tablet Place 1 tablet (0.4 mg total) under the tongue every 5 (five) minutes as needed for chest pain. 25 tablet 3  . Omega-3 Fatty Acids (FISH OIL) 1200 MG CAPS Take 1,200 mg by mouth daily.    . potassium chloride (K-DUR) 10 MEQ tablet TAKE 1 TABLET BY MOUTH ONCE DAILY 90 tablet 1  . tamsulosin (FLOMAX) 0.4 MG CAPS capsule Take 0.8 mg by mouth every evening.     . Tiotropium Bromide-Olodaterol (STIOLTO RESPIMAT) 2.5-2.5 MCG/ACT AERS INHALE 2 PUFFS BY MOUTH ONCE DAILY 4 g 11  . tiZANidine (ZANAFLEX) 4 MG tablet Take 4 mg by mouth at bedtime as needed for muscle spasms.    . clonazePAM (KLONOPIN) 1 MG tablet Take 1 mg by mouth as needed.    Marland Kitchen omeprazole (PRILOSEC) 20 MG capsule Take 20 mg by mouth every other day.     No current facility-administered medications on file prior to visit.     Cardiac Studies:   CABG 2007: LIMA to LAD, SVG to D2, SVG to OM1 and ramus intermediate with OM1 limb occluded, SVG to RCA with a proximal 50% stenosis by coronary angiography in July 2018. Normal LVEF.  Lexiscan sestamibi stress test 12/03/2016: No significant ST-T wave changes during stress test. Medium defect of moderate intensity in the basal inferior, mid inferoseptal, mid inferior and mid inferolateral and apical inferior and apical lateral location consistent with Myocardial infarction  mild degree of peri-infarct ischemia. LVEF 52%.  Intermediate risk study.  Echocardiogram 09/26/2018: Left ventricle cavity is normal in size. Moderate concentric hypertrophy of the left ventricle. Abnormal septal wall motion due to post-operative coronary artery bypass graft. Doppler evidence of grade I (impaired) diastolic dysfunction, normal LAP. Calculated EF 56%. Left atrial cavity is normal in size. The interatrial Septum is thin and mobile but appears to be intact by 2D and CF Doppler interrogation. The aortic root is mildly dilated at 4.11 cm. Compared to Echocardiogram 03/19/2017: Normal LV systolic function, severe LVH, EF 07-22%, grade 1 diastolic dysfunction. Right ventricle mildly dilated.  Coronary angiogram 10/08/2018 and had a very high-grade stenosis of a large hiatal D1/ramus branch S/P 3.0 x 23 mm Sierra Xience DES. Patent LIMA to LAD, SVG to D2 and SVG to RCA widely patent. Normal right heart catheterization, preserved cardiac output and cardiac index.  EKG:   EKG 10/12/2020: Sinus rhythm with  first-degree AV block at rate of 82 bpm, normal axis, right bundle branch block.   Assessment:     ICD-10-CM   1. Coronary artery disease involving native coronary artery of native heart without angina pectoris  I25.10 EKG 12-Lead  2. Thoracic aortic aneurysm without rupture (HCC)  I71.2   3. Dyspnea on exertion  R06.00   4. Mixed hyperlipidemia  E78.2     Recommendations:   [name]  is a 79 y.o. male  with known coronary artery disease and CABG in 2007 in the setting of non-STEMI in atrial fibrillation with RVR.He has a history of  45 pack year h/o smoking tobacco, ascending aortic aneurysm (follows Dr.Bryan Bartle), small abdominal aortic aneurysm noted in 2015 (3cm), H/O colon cancer, in remission since 1992, GERD, hyperlipidemia, obstructive sleep apnea not using CPAP.  He presents for his annual visit, states that over the past few months his activity has been markedly  limited due to left hip pain and thinks that he needs left hip replacement.   Patient is here on a 1 year follow-up visit for CAD.  He is presently doing well without any symptoms of angina.  He does have chronic dyspnea on exertion that is unchanged and most likely multifactorial.  Encouraged him to work on weight loss to help with this with diet modifications and regular exercise.    I reviewed his external labs, lipids are under excellent control.  Renal function is also stable.  Is not used any sublingual nitroglycerin and there is no clinical evidence of heart failure.  Blood pressure is well controlled and he is on appropriate medical therapy.  Stable aortic aneurysm at 4.4 cm.  This is being followed by Dr. Cyndia Bent and he states will have repeat CT scan in 2 years for surveillance.  Overall, patient is stable from cardiac standpoint.  We will plan to see him back in 1 year or sooner if needed.  He is contemplating left hip surgery, I do not see any contraindication for the same, advised him to continue to lose his weight which would help with recovery.   Adrian Prows, MD, [facility] 10/12/2020, 11:05 PM Office: [phone] Pager: [phone]

## 2020-10-20 DIAGNOSIS — M25551 Pain in right hip: Secondary | ICD-10-CM | POA: Insufficient documentation

## 2020-10-20 DIAGNOSIS — M5416 Radiculopathy, lumbar region: Secondary | ICD-10-CM | POA: Diagnosis not present

## 2020-10-20 DIAGNOSIS — M545 Low back pain, unspecified: Secondary | ICD-10-CM | POA: Diagnosis not present

## 2020-11-03 ENCOUNTER — Other Ambulatory Visit: Payer: Self-pay

## 2020-11-03 ENCOUNTER — Ambulatory Visit (AMBULATORY_SURGERY_CENTER): Payer: Self-pay | Admitting: *Deleted

## 2020-11-03 VITALS — Ht 71.0 in | Wt 228.0 lb

## 2020-11-03 DIAGNOSIS — Z86010 Personal history of colonic polyps: Secondary | ICD-10-CM

## 2020-11-03 DIAGNOSIS — Z85038 Personal history of other malignant neoplasm of large intestine: Secondary | ICD-10-CM

## 2020-11-03 MED ORDER — SUPREP BOWEL PREP KIT 17.5-3.13-1.6 GM/177ML PO SOLN: 1.0000 | Freq: Once | ORAL | 0 refills | Status: AC

## 2020-11-03 NOTE — Progress Notes (Signed)
Fully vax;d- had covid 09-09-2020  No egg or soy allergy known to patient  No issues with past sedation with any surgeries or procedures no intubation problems in the past  No FH of Malignant Hyperthermia No diet pills per patient No home 02 use per patient  No blood thinners per patient  Pt denies issues with constipation  No A fib or A flutter  EMMI video to pt or via [place] 19 guidelines implemented in PV today with Pt and RN    Due to the COVID-19 pandemic we are asking patients to follow these guidelines. Please only bring one care partner. Please be aware that your care partner may wait in the car in the parking lot or if they feel like they will be too hot to wait in the car, they may wait in the lobby on the 4th floor. All care partners are required to wear a mask the entire time (we do not have any that we can provide them), they need to practice social distancing, and we will do a Covid check for all patient's and care partners when you arrive. Also we will check their temperature and your temperature. If the care partner waits in their car they need to stay in the parking lot the entire time and we will call them on their cell phone when the patient is ready for discharge so they can bring the car to the front of the building. Also all patient's will need to wear a mask into building.

## 2020-11-18 ENCOUNTER — Other Ambulatory Visit: Payer: Self-pay

## 2020-11-18 ENCOUNTER — Ambulatory Visit (AMBULATORY_SURGERY_CENTER): Payer: Medicare PPO | Admitting: Internal Medicine

## 2020-11-18 ENCOUNTER — Encounter: Payer: Self-pay | Admitting: Internal Medicine

## 2020-11-18 ENCOUNTER — Encounter: Payer: Medicare PPO | Admitting: Internal Medicine

## 2020-11-18 VITALS — BP 132/65 | HR 80 | Temp 98.4°F | Resp 18 | Ht 71.0 in | Wt 228.0 lb

## 2020-11-18 DIAGNOSIS — D128 Benign neoplasm of rectum: Secondary | ICD-10-CM | POA: Diagnosis not present

## 2020-11-18 DIAGNOSIS — D124 Benign neoplasm of descending colon: Secondary | ICD-10-CM

## 2020-11-18 DIAGNOSIS — D123 Benign neoplasm of transverse colon: Secondary | ICD-10-CM

## 2020-11-18 DIAGNOSIS — D125 Benign neoplasm of sigmoid colon: Secondary | ICD-10-CM

## 2020-11-18 DIAGNOSIS — I10 Essential (primary) hypertension: Secondary | ICD-10-CM | POA: Diagnosis not present

## 2020-11-18 DIAGNOSIS — Z85038 Personal history of other malignant neoplasm of large intestine: Secondary | ICD-10-CM | POA: Diagnosis not present

## 2020-11-18 DIAGNOSIS — D129 Benign neoplasm of anus and anal canal: Secondary | ICD-10-CM

## 2020-11-18 DIAGNOSIS — I2510 Atherosclerotic heart disease of native coronary artery without angina pectoris: Secondary | ICD-10-CM | POA: Diagnosis not present

## 2020-11-18 DIAGNOSIS — D127 Benign neoplasm of rectosigmoid junction: Secondary | ICD-10-CM | POA: Diagnosis not present

## 2020-11-18 DIAGNOSIS — J449 Chronic obstructive pulmonary disease, unspecified: Secondary | ICD-10-CM | POA: Diagnosis not present

## 2020-11-18 DIAGNOSIS — G4733 Obstructive sleep apnea (adult) (pediatric): Secondary | ICD-10-CM | POA: Diagnosis not present

## 2020-11-18 MED ORDER — SODIUM CHLORIDE 0.9 % IV SOLN: 500.0000 mL | Freq: Once | INTRAVENOUS | Status: DC

## 2020-11-18 NOTE — Progress Notes (Signed)
VS by CW  I have reviewed the patient's medical history in detail and updated the computerized patient record.

## 2020-11-18 NOTE — Patient Instructions (Signed)
Handout on polyps, diverticulosis and hemorrhoids given. ? ?YOU HAD AN ENDOSCOPIC PROCEDURE TODAY AT THE [facility]:   Refer to the procedure report that was given to you for any specific questions about what was found during the examination.  If the procedure report does not answer your questions, please call your gastroenterologist to clarify.  If you requested that your care partner not be given the details of your procedure findings, then the procedure report has been included in a sealed envelope for you to review at your convenience later. ? ?YOU SHOULD EXPECT: Some feelings of bloating in the abdomen. Passage of more gas than usual.  Walking can help get rid of the air that was put into your GI tract during the procedure and reduce the bloating. If you had a lower endoscopy (such as a colonoscopy or flexible sigmoidoscopy) you may notice spotting of blood in your stool or on the toilet paper. If you underwent a bowel prep for your procedure, you may not have a normal bowel movement for a few days. ? ?Please Note:  You might notice some irritation and congestion in your nose or some drainage.  This is from the oxygen used during your procedure.  There is no need for concern and it should clear up in a day or so. ? ?SYMPTOMS TO REPORT IMMEDIATELY: ? ?Following lower endoscopy (colonoscopy or flexible sigmoidoscopy): ? Excessive amounts of blood in the stool ? Significant tenderness or worsening of abdominal pains ? Swelling of the abdomen that is new, acute ? Fever of 100?F or higher ? ? ?For urgent or emergent issues, a gastroenterologist can be reached at any hour by calling [phone]. ?Do not use MyChart messaging for urgent concerns.  ? ? ?DIET:  We do recommend a small meal at first, but then you may proceed to your regular diet.  Drink plenty of fluids but you should avoid alcoholic beverages for 24 hours. ? ?ACTIVITY:  You should plan to take it easy for the rest of today and you  should NOT DRIVE or use heavy machinery until tomorrow (because of the sedation medicines used during the test).   ? ?FOLLOW UP: ?Our staff will call the number listed on your records 48-72 hours following your procedure to check on you and address any questions or concerns that you may have regarding the information given to you following your procedure. If we do not reach you, we will leave a message.  We will attempt to reach you two times.  During this call, we will ask if you have developed any symptoms of COVID 19. If you develop any symptoms (ie: fever, flu-like symptoms, shortness of breath, cough etc.) before then, please call [phone].  If you test positive for Covid 19 in the 2 weeks post procedure, please call and report this information to us.   ? ?If any biopsies were taken you will be contacted by phone or by letter within the next 1-3 weeks.  Please call us at [phone] if you have not heard about the biopsies in 3 weeks.  ? ? ?SIGNATURES/CONFIDENTIALITY: ?You and/or your care partner have signed paperwork which will be entered into your electronic medical record.  These signatures attest to the fact that that the information above on your After Visit Summary has been reviewed and is understood.  Full responsibility of the confidentiality of this discharge information lies with you and/or your care-partner.  ?

## 2020-11-18 NOTE — Op Note (Signed)
[place] Patient Name: [name] Procedure Date: 11/18/2020 9:05 AM MRN: [id number] Endoscopist: Jerene Bears , MD Age: 79 Referring MD:  Date of Birth: [birth year] Gender: Male Account #: [id number] Procedure:                Colonoscopy Indications:              High risk colon cancer surveillance: Personal                            history of colon cancer and multiple adenomatous                            colon polyps (15 at last exam), Last colonoscopy:                            December 2020 Medicines:                Monitored Anesthesia Care Procedure:                Pre-Anesthesia Assessment:                           - Prior to the procedure, a History and Physical                            was performed, and patient medications and                            allergies were reviewed. The patient's tolerance of                            previous anesthesia was also reviewed. The risks                            and benefits of the procedure and the sedation                            options and risks were discussed with the patient.                            All questions were answered, and informed consent                            was obtained. Prior Anticoagulants: The patient has                            taken no previous anticoagulant or antiplatelet                            agents. ASA Grade Assessment: III - A patient with                            severe systemic disease. After reviewing the risks  and benefits, the patient was deemed in                            satisfactory condition to undergo the procedure.                           After obtaining informed consent, the colonoscope                            was passed under direct vision. Throughout the                            procedure, the patient's blood pressure, pulse, and                            oxygen saturations were monitored continuously. The                             Colonoscope was introduced through the anus and                            advanced to the cecum, identified by appendiceal                            orifice and ileocecal valve. The colonoscopy was                            performed without difficulty. The patient tolerated                            the procedure well. The quality of the bowel                            preparation was good. The ileocecal valve,                            appendiceal orifice, and rectum were photographed. Scope In: 9:20:47 AM Scope Out: 9:40:38 AM Scope Withdrawal Time: 0 hours 16 minutes 15 seconds  Total Procedure Duration: 0 hours 19 minutes 51 seconds  Findings:                 The digital rectal exam was normal.                           Six sessile polyps were found in the rectum,                            sigmoid colon, descending colon and transverse                            colon. The polyps were 2 to 6 mm in size. These                            polyps were removed with a cold snare.  Resection                            and retrieval were complete.                           Multiple small and large-mouthed diverticula were                            found in the sigmoid colon, descending colon,                            transverse colon, ascending colon and cecum.                           Internal hemorrhoids were found during                            retroflexion. The hemorrhoids were medium-sized. Complications:            No immediate complications. Estimated Blood Loss:     Estimated blood loss was minimal. Impression:               - Six 2 to 6 mm polyps in the rectum, in the                            sigmoid colon, in the descending colon and in the                            transverse colon, removed with a cold snare.                            Resected and retrieved.                           - Diverticulosis in the sigmoid colon, in the                             descending colon, in the transverse colon, in the                            ascending colon and in the cecum.                           - Internal hemorrhoids. Recommendation:           - Patient has a contact number available for                            emergencies. The signs and symptoms of potential                            delayed complications were discussed with the                            patient. Return to normal activities tomorrow.  Written discharge instructions were provided to the                            patient.                           - Resume previous diet.                           - Continue present medications.                           - Await pathology results.                           - Repeat colonoscopy may be recommended. The                            colonoscopy date will be determined after pathology                            results from today's exam become available for                            review. Jerene Bears, MD 11/18/2020 9:47:40 AM This report has been signed electronically.

## 2020-11-18 NOTE — Progress Notes (Signed)
Report to PACU, RN, vss, BBS= Clear.  

## 2020-11-18 NOTE — Progress Notes (Signed)
Called to room to assist during endoscopic procedure.  Patient ID and intended procedure confirmed with present staff. Received instructions for my participation in the procedure from the performing physician.  

## 2020-11-22 ENCOUNTER — Telehealth: Payer: Self-pay | Admitting: *Deleted

## 2020-11-22 ENCOUNTER — Telehealth: Payer: Self-pay

## 2020-11-22 NOTE — Telephone Encounter (Signed)
First attempt follow up call to pt, lm on vm 

## 2020-11-22 NOTE — Telephone Encounter (Signed)
  Follow up Call-  Call back number 11/18/2020 11/18/2019  Post procedure Call Back phone  # [phone]  Permission to leave phone message Yes Yes  Some recent data might be hidden     Patient questions:  Do you have a fever, pain , or abdominal swelling? No. Pain Score  0 *  Have you tolerated food without any problems? Yes.    Have you been able to return to your normal activities? Yes.    Do you have any questions about your discharge instructions: Diet   No. Medications  No. Follow up visit  No.  Do you have questions or concerns about your Care? No.  Actions: * If pain score is 4 or above: 1. No action needed, pain <4.Have you developed a fever since your procedure? no  2.   Have you had an respiratory symptoms (SOB or cough) since your procedure? no  3.   Have you tested positive for COVID 19 since your procedure no  4.   Have you had any family members/close contacts diagnosed with the COVID 19 since your procedure?  no   If yes to any of these questions please route to Joylene John, RN and Joella Prince, RN

## 2020-11-29 ENCOUNTER — Encounter: Payer: Self-pay | Admitting: Internal Medicine

## 2020-12-09 ENCOUNTER — Other Ambulatory Visit: Payer: Self-pay | Admitting: Emergency Medicine

## 2020-12-23 ENCOUNTER — Other Ambulatory Visit: Payer: Self-pay | Admitting: Cardiology

## 2021-02-07 DIAGNOSIS — Z0000 Encounter for general adult medical examination without abnormal findings: Secondary | ICD-10-CM | POA: Diagnosis not present

## 2021-02-07 DIAGNOSIS — N1831 Chronic kidney disease, stage 3a: Secondary | ICD-10-CM | POA: Diagnosis not present

## 2021-02-07 DIAGNOSIS — G8929 Other chronic pain: Secondary | ICD-10-CM | POA: Diagnosis not present

## 2021-02-07 DIAGNOSIS — E7800 Pure hypercholesterolemia, unspecified: Secondary | ICD-10-CM | POA: Diagnosis not present

## 2021-02-07 DIAGNOSIS — K219 Gastro-esophageal reflux disease without esophagitis: Secondary | ICD-10-CM | POA: Diagnosis not present

## 2021-02-07 DIAGNOSIS — I10 Essential (primary) hypertension: Secondary | ICD-10-CM | POA: Diagnosis not present

## 2021-02-07 DIAGNOSIS — J410 Simple chronic bronchitis: Secondary | ICD-10-CM | POA: Diagnosis not present

## 2021-02-13 DIAGNOSIS — J441 Chronic obstructive pulmonary disease with (acute) exacerbation: Secondary | ICD-10-CM | POA: Diagnosis not present

## 2021-03-03 DIAGNOSIS — H35373 Puckering of macula, bilateral: Secondary | ICD-10-CM | POA: Diagnosis not present

## 2021-03-03 DIAGNOSIS — Z0100 Encounter for examination of eyes and vision without abnormal findings: Secondary | ICD-10-CM | POA: Diagnosis not present

## 2021-03-09 ENCOUNTER — Other Ambulatory Visit: Payer: Self-pay | Admitting: Emergency Medicine

## 2021-03-20 ENCOUNTER — Telehealth: Payer: Self-pay | Admitting: Emergency Medicine

## 2021-03-20 NOTE — Telephone Encounter (Signed)
Please ask him to take pred and azithro:  Pred > Take 40mg  daily for 3 days, then 30mg  daily for 3 days, then 20mg  daily for 3 days, then 10mg  daily for 3 days, then stop  Azithro > Z-pack  Needs an OV w APP in 1-2 weeks to insure he is improving

## 2021-03-20 NOTE — Telephone Encounter (Signed)
I have called the pt and LM on VM for him to call back.

## 2021-03-20 NOTE — Telephone Encounter (Signed)
Called and spoke to patient.  Patient reports of increased sob with exertion and in the morning after waking up, wheezing, prod cough with clear sputum. Sx have been present for 3 weeks. He does not wear supplemental oxygen. spo2 has dropped as low as 87% at rest on room air.   Denied fever, chills, sweats or additional sx.  Using stiolto daily, albuterol HFA 2-3x daily and albuterol neb daily with temporary relief in sx. Fully vaccinated against covid and flu.  Dr. Lamonte Sakai, please advise. Thanks  Current Outpatient Medications on File Prior to Visit  Medication Sig Dispense Refill  . albuterol (PROVENTIL HFA;VENTOLIN HFA) 108 (90 Base) MCG/ACT inhaler Inhale 2 puffs into the lungs every 4 (four) hours as needed for wheezing or shortness of breath. 1 Inhaler 5  . albuterol (PROVENTIL) (2.5 MG/3ML) 0.083% nebulizer solution USE 1 VIAL IN NEBULIZER EVERY 6 HOURS AS NEEDED FOR WHEEZING OR SHORTNESS OF BREATH 75 mL 3  . albuterol (VENTOLIN HFA) 108 (90 Base) MCG/ACT inhaler Inhale 2 puffs into the lungs every 6 (six) hours as needed for wheezing or shortness of breath. 8 g 11  . aspirin EC 81 MG tablet Take 1 tablet (81 mg total) by mouth daily. 30 tablet 3  . atorvastatin (LIPITOR) 20 MG tablet Take 20 mg by mouth at bedtime.   5  . clonazePAM (KLONOPIN) 1 MG tablet Take 1 mg by mouth as needed.    . fluticasone (FLONASE) 50 MCG/ACT nasal spray Place 2 sprays into both nostrils daily. (Patient taking differently: Place 2 sprays into both nostrils as needed. ) 16 g 2  . furosemide (LASIX) 20 MG tablet Take 1 tablet (20 mg total) by mouth daily. 30 tablet 3  . gabapentin (NEURONTIN) 100 MG capsule Take 100 mg by mouth 2 (two) times daily.    . Garlic 8756 MG CAPS Take 1,000 mg by mouth daily.    Marland Kitchen guaiFENesin (MUCINEX) 600 MG 12 hr tablet Take 600 mg by mouth 2 (two) times daily as needed.     Marland Kitchen HYDROcodone-acetaminophen (NORCO/VICODIN) 5-325 MG tablet Take 1 tablet by mouth every 6 (six) hours as  needed for moderate pain.    . isosorbide mononitrate (IMDUR) 30 MG 24 hr tablet Take 1 tablet by mouth once daily 90 tablet 1  . ketoconazole (NIZORAL) 2 % cream Apply 1 application topically daily.    . metoprolol succinate (TOPROL-XL) 50 MG 24 hr tablet Take 1 tablet by mouth twice daily 180 tablet 0  . Multiple Vitamin (MULTIVITAMIN WITH MINERALS) TABS tablet Take 1 tablet by mouth daily.    . nitroGLYCERIN (NITROSTAT) 0.4 MG SL tablet Place 1 tablet (0.4 mg total) under the tongue every 5 (five) minutes as needed for chest pain. 25 tablet 3  . Omega-3 Fatty Acids (FISH OIL) 1200 MG CAPS Take 1,200 mg by mouth daily.    Marland Kitchen omeprazole (PRILOSEC) 20 MG capsule Take 20 mg by mouth every other day.    . potassium chloride (K-DUR) 10 MEQ tablet TAKE 1 TABLET BY MOUTH ONCE DAILY 90 tablet 1  . STIOLTO RESPIMAT 2.5-2.5 MCG/ACT AERS INHALE 2 PUFFS BY MOUTH ONCE DAILY 4 g 5  . tamsulosin (FLOMAX) 0.4 MG CAPS capsule Take 0.8 mg by mouth every evening.     Marland Kitchen tiZANidine (ZANAFLEX) 4 MG tablet Take 4 mg by mouth at bedtime as needed for muscle spasms.     No current facility-administered medications on file prior to visit.    No Known Allergies

## 2021-03-21 NOTE — Telephone Encounter (Signed)
Lmtcb for pt.  

## 2021-03-22 MED ORDER — DOXYCYCLINE HYCLATE 100 MG PO TABS: 100.0000 mg | ORAL_TABLET | Freq: Two times a day (BID) | ORAL | 0 refills | Status: DC

## 2021-03-22 MED ORDER — PREDNISONE 10 MG PO TABS: ORAL_TABLET | ORAL | 0 refills | Status: DC

## 2021-03-22 NOTE — Telephone Encounter (Signed)
[facility] home number. Left message on machine. Called and spoke with Patient's Wife, Collie Siad. Dr. Agustina Caroli recommendations given. Understanding stated. Doxycycline prescription sent to requested pharmacy. Nothing further at this time.

## 2021-03-22 NOTE — Telephone Encounter (Signed)
Give him doxycycline instead.   Doxy 100mg  bid x 7 days.

## 2021-03-22 NOTE — Telephone Encounter (Signed)
Called and spoke with patient. He verbalized understanding of RB's recommendations. He is ok with the prednisone but stated that he has tried zpaks before in the past and they normally do not work. He stated that doxycycline normally works well for him.   RB, please advise.

## 2021-03-25 ENCOUNTER — Encounter (HOSPITAL_COMMUNITY): Payer: Self-pay | Admitting: Emergency Medicine

## 2021-03-25 ENCOUNTER — Observation Stay (HOSPITAL_COMMUNITY)
Admission: EM | Admit: 2021-03-25 | Discharge: 2021-03-26 | Disposition: A | Discharge status: Home / Self Care | Payer: Medicare PPO | Attending: Family Medicine | Admitting: Family Medicine

## 2021-03-25 ENCOUNTER — Emergency Department (HOSPITAL_COMMUNITY): Payer: Medicare PPO

## 2021-03-25 ENCOUNTER — Other Ambulatory Visit: Payer: Self-pay

## 2021-03-25 DIAGNOSIS — I110 Hypertensive heart disease with heart failure: Secondary | ICD-10-CM | POA: Diagnosis not present

## 2021-03-25 DIAGNOSIS — J811 Chronic pulmonary edema: Secondary | ICD-10-CM | POA: Diagnosis not present

## 2021-03-25 DIAGNOSIS — I2510 Atherosclerotic heart disease of native coronary artery without angina pectoris: Secondary | ICD-10-CM | POA: Diagnosis not present

## 2021-03-25 DIAGNOSIS — I25119 Atherosclerotic heart disease of native coronary artery with unspecified angina pectoris: Secondary | ICD-10-CM | POA: Diagnosis present

## 2021-03-25 DIAGNOSIS — Z85828 Personal history of other malignant neoplasm of skin: Secondary | ICD-10-CM | POA: Diagnosis not present

## 2021-03-25 DIAGNOSIS — I10 Essential (primary) hypertension: Secondary | ICD-10-CM | POA: Diagnosis present

## 2021-03-25 DIAGNOSIS — I5032 Chronic diastolic (congestive) heart failure: Secondary | ICD-10-CM | POA: Diagnosis present

## 2021-03-25 DIAGNOSIS — N400 Enlarged prostate without lower urinary tract symptoms: Secondary | ICD-10-CM | POA: Diagnosis present

## 2021-03-25 DIAGNOSIS — Z87891 Personal history of nicotine dependence: Secondary | ICD-10-CM | POA: Insufficient documentation

## 2021-03-25 DIAGNOSIS — Z86718 Personal history of other venous thrombosis and embolism: Secondary | ICD-10-CM

## 2021-03-25 DIAGNOSIS — R002 Palpitations: Secondary | ICD-10-CM | POA: Diagnosis not present

## 2021-03-25 DIAGNOSIS — R079 Chest pain, unspecified: Secondary | ICD-10-CM | POA: Diagnosis not present

## 2021-03-25 DIAGNOSIS — Z951 Presence of aortocoronary bypass graft: Secondary | ICD-10-CM

## 2021-03-25 DIAGNOSIS — I480 Paroxysmal atrial fibrillation: Secondary | ICD-10-CM | POA: Diagnosis not present

## 2021-03-25 DIAGNOSIS — R009 Unspecified abnormalities of heart beat: Secondary | ICD-10-CM | POA: Diagnosis present

## 2021-03-25 DIAGNOSIS — Z20822 Contact with and (suspected) exposure to covid-19: Secondary | ICD-10-CM | POA: Diagnosis not present

## 2021-03-25 DIAGNOSIS — G4733 Obstructive sleep apnea (adult) (pediatric): Secondary | ICD-10-CM | POA: Diagnosis present

## 2021-03-25 DIAGNOSIS — Z85038 Personal history of other malignant neoplasm of large intestine: Secondary | ICD-10-CM | POA: Insufficient documentation

## 2021-03-25 DIAGNOSIS — Z79899 Other long term (current) drug therapy: Secondary | ICD-10-CM | POA: Insufficient documentation

## 2021-03-25 DIAGNOSIS — R008 Other abnormalities of heart beat: Secondary | ICD-10-CM | POA: Diagnosis not present

## 2021-03-25 DIAGNOSIS — R0602 Shortness of breath: Secondary | ICD-10-CM | POA: Diagnosis not present

## 2021-03-25 DIAGNOSIS — Z9989 Dependence on other enabling machines and devices: Secondary | ICD-10-CM | POA: Diagnosis not present

## 2021-03-25 DIAGNOSIS — J449 Chronic obstructive pulmonary disease, unspecified: Secondary | ICD-10-CM | POA: Diagnosis not present

## 2021-03-25 DIAGNOSIS — I5043 Acute on chronic combined systolic (congestive) and diastolic (congestive) heart failure: Secondary | ICD-10-CM | POA: Diagnosis not present

## 2021-03-25 DIAGNOSIS — Z7982 Long term (current) use of aspirin: Secondary | ICD-10-CM | POA: Diagnosis not present

## 2021-03-25 DIAGNOSIS — Z8616 Personal history of COVID-19: Secondary | ICD-10-CM | POA: Diagnosis not present

## 2021-03-25 DIAGNOSIS — I517 Cardiomegaly: Secondary | ICD-10-CM | POA: Diagnosis not present

## 2021-03-25 DIAGNOSIS — I4891 Unspecified atrial fibrillation: Secondary | ICD-10-CM

## 2021-03-25 LAB — COMPREHENSIVE METABOLIC PANEL
ALT: 34 U/L (ref 0–44)
AST: 28 U/L (ref 15–41)
Albumin: 3.7 g/dL (ref 3.5–5.0)
Alkaline Phosphatase: 54 U/L (ref 38–126)
Anion gap: 11 (ref 5–15)
BUN: 19 mg/dL (ref 8–23)
CO2: 23 mmol/L (ref 22–32)
Calcium: 9.2 mg/dL (ref 8.9–10.3)
Chloride: 103 mmol/L (ref 98–111)
Creatinine, Ser: 1.2 mg/dL (ref 0.61–1.24)
GFR, Estimated: >60 mL/min (ref >60)
Glucose, Bld: 125 mg/dL — ABNORMAL HIGH (ref 70–99)
Potassium: 4 mmol/L (ref 3.5–5.1)
Sodium: 137 mmol/L (ref 135–145)
Total Bilirubin: 0.5 mg/dL (ref 0.3–1.2)
Total Protein: 7.2 g/dL (ref 6.5–8.1)

## 2021-03-25 LAB — TROPONIN I (HIGH SENSITIVITY)
Troponin I (High Sensitivity): 12 ng/L (ref <18)
Troponin I (High Sensitivity): 13 ng/L (ref <18)

## 2021-03-25 LAB — CBC WITH DIFFERENTIAL/PLATELET
Abs Immature Granulocytes: 0.05 10*3/uL (ref 0.00–0.07)
Basophils Absolute: 0 10*3/uL (ref 0.0–0.1)
Basophils Relative: 0 %
Eosinophils Absolute: 0 10*3/uL (ref 0.0–0.5)
Eosinophils Relative: 0 %
HCT: 44.3 % (ref 39.0–52.0)
Hemoglobin: 14.5 g/dL (ref 13.0–17.0)
Immature Granulocytes: 1 %
Lymphocytes Relative: 12 %
Lymphs Abs: 1.2 10*3/uL (ref 0.7–4.0)
MCH: 27.8 pg (ref 26.0–34.0)
MCHC: 32.7 g/dL (ref 30.0–36.0)
MCV: 85 fL (ref 80.0–100.0)
Monocytes Absolute: 0.4 10*3/uL (ref 0.1–1.0)
Monocytes Relative: 4 %
Neutro Abs: 8.1 10*3/uL — ABNORMAL HIGH (ref 1.7–7.7)
Neutrophils Relative %: 83 %
Platelets: 235 10*3/uL (ref 150–400)
RBC: 5.21 MIL/uL (ref 4.22–5.81)
RDW: 15.2 % (ref 11.5–15.5)
WBC: 9.8 10*3/uL (ref 4.0–10.5)
nRBC: 0 % (ref 0.0–0.2)

## 2021-03-25 LAB — TSH: TSH: 0.63 u[IU]/mL (ref 0.350–4.500)

## 2021-03-25 LAB — RESP PANEL BY RT-PCR (FLU A&B, COVID) ARPGX2
Influenza A by PCR: NEGATIVE
Influenza B by PCR: NEGATIVE
SARS Coronavirus 2 by RT PCR: NEGATIVE

## 2021-03-25 LAB — MAGNESIUM: Magnesium: 1.9 mg/dL (ref 1.7–2.4)

## 2021-03-25 LAB — MRSA PCR SCREENING: MRSA by PCR: NEGATIVE

## 2021-03-25 LAB — BRAIN NATRIURETIC PEPTIDE: B Natriuretic Peptide: 189 pg/mL — ABNORMAL HIGH (ref 0.0–100.0)

## 2021-03-25 LAB — PROCALCITONIN: Procalcitonin: <0.1 ng/mL

## 2021-03-25 MED ORDER — PANTOPRAZOLE SODIUM 40 MG PO TBEC
40.0000 mg | DELAYED_RELEASE_TABLET | Freq: Every day | ORAL | Status: DC
  Administered 2021-03-26: 40 mg via ORAL
  Filled 2021-03-25 (×2): qty 1

## 2021-03-25 MED ORDER — SODIUM CHLORIDE 0.9% FLUSH
3.0000 mL | Freq: Two times a day (BID) | INTRAVENOUS | Status: DC
  Administered 2021-03-26: 3 mL via INTRAVENOUS

## 2021-03-25 MED ORDER — TRAZODONE HCL 50 MG PO TABS: 50.0000 mg | ORAL_TABLET | Freq: Every evening | ORAL | Status: DC | PRN

## 2021-03-25 MED ORDER — ACETAMINOPHEN 325 MG PO TABS: 650.0000 mg | ORAL_TABLET | Freq: Four times a day (QID) | ORAL | Status: DC | PRN

## 2021-03-25 MED ORDER — BISACODYL 10 MG RE SUPP: 10.0000 mg | Freq: Every day | RECTAL | Status: DC | PRN

## 2021-03-25 MED ORDER — DILTIAZEM HCL-DEXTROSE 125-5 MG/125ML-% IV SOLN (PREMIX)
5.0000 mg/h | INTRAVENOUS | Status: DC
  Administered 2021-03-25: 5 mg/h via INTRAVENOUS
  Filled 2021-03-25: qty 125

## 2021-03-25 MED ORDER — CLONAZEPAM 0.5 MG PO TABS: 0.5000 mg | ORAL_TABLET | Freq: Two times a day (BID) | ORAL | Status: DC | PRN

## 2021-03-25 MED ORDER — SODIUM CHLORIDE 0.9% FLUSH: 3.0000 mL | INTRAVENOUS | Status: DC | PRN

## 2021-03-25 MED ORDER — SODIUM CHLORIDE 0.9 % IV SOLN: 250.0000 mL | INTRAVENOUS | Status: DC | PRN

## 2021-03-25 MED ORDER — HYDROCODONE-ACETAMINOPHEN 5-325 MG PO TABS: 1.0000 | ORAL_TABLET | Freq: Four times a day (QID) | ORAL | Status: DC | PRN

## 2021-03-25 MED ORDER — ADULT MULTIVITAMIN W/MINERALS CH
1.0000 | ORAL_TABLET | Freq: Every day | ORAL | Status: DC
  Administered 2021-03-26: 1 via ORAL
  Filled 2021-03-25 (×2): qty 1

## 2021-03-25 MED ORDER — DOXYCYCLINE HYCLATE 100 MG PO TABS
100.0000 mg | ORAL_TABLET | Freq: Two times a day (BID) | ORAL | Status: DC
  Administered 2021-03-25 – 2021-03-26 (×2): 100 mg via ORAL
  Filled 2021-03-25 (×2): qty 1

## 2021-03-25 MED ORDER — ATORVASTATIN CALCIUM 20 MG PO TABS
20.0000 mg | ORAL_TABLET | Freq: Every day | ORAL | Status: DC
  Administered 2021-03-25: 20 mg via ORAL
  Filled 2021-03-25: qty 1

## 2021-03-25 MED ORDER — GABAPENTIN 100 MG PO CAPS
100.0000 mg | ORAL_CAPSULE | Freq: Two times a day (BID) | ORAL | Status: DC
  Administered 2021-03-25 – 2021-03-26 (×2): 100 mg via ORAL
  Filled 2021-03-25 (×2): qty 1

## 2021-03-25 MED ORDER — FUROSEMIDE 20 MG PO TABS
20.0000 mg | ORAL_TABLET | Freq: Every day | ORAL | Status: DC
  Administered 2021-03-26: 20 mg via ORAL
  Filled 2021-03-25 (×2): qty 1

## 2021-03-25 MED ORDER — TAMSULOSIN HCL 0.4 MG PO CAPS
0.8000 mg | ORAL_CAPSULE | Freq: Every evening | ORAL | Status: DC
  Administered 2021-03-25: 0.8 mg via ORAL
  Filled 2021-03-25: qty 2

## 2021-03-25 MED ORDER — POLYETHYLENE GLYCOL 3350 17 G PO PACK: 17.0000 g | PACK | Freq: Every day | ORAL | Status: DC | PRN

## 2021-03-25 MED ORDER — SODIUM CHLORIDE 0.9 % IV BOLUS
500.0000 mL | Freq: Once | INTRAVENOUS | Status: AC
  Administered 2021-03-25: 500 mL via INTRAVENOUS

## 2021-03-25 MED ORDER — METOPROLOL SUCCINATE ER 50 MG PO TB24
50.0000 mg | ORAL_TABLET | Freq: Two times a day (BID) | ORAL | Status: DC
  Administered 2021-03-25 – 2021-03-26 (×2): 50 mg via ORAL
  Filled 2021-03-25 (×2): qty 1

## 2021-03-25 MED ORDER — POTASSIUM CHLORIDE ER 10 MEQ PO TBCR
10.0000 meq | EXTENDED_RELEASE_TABLET | Freq: Every day | ORAL | Status: DC
  Administered 2021-03-26: 10 meq via ORAL
  Filled 2021-03-25 (×3): qty 1

## 2021-03-25 MED ORDER — NITROGLYCERIN 0.4 MG SL SUBL: 0.4000 mg | SUBLINGUAL_TABLET | SUBLINGUAL | Status: DC | PRN

## 2021-03-25 MED ORDER — HEPARIN SODIUM (PORCINE) 5000 UNIT/ML IJ SOLN
5000.0000 [IU] | Freq: Three times a day (TID) | INTRAMUSCULAR | Status: DC
  Administered 2021-03-25 – 2021-03-26 (×3): 5000 [IU] via SUBCUTANEOUS
  Filled 2021-03-25 (×2): qty 1

## 2021-03-25 MED ORDER — ARFORMOTEROL TARTRATE 15 MCG/2ML IN NEBU
15.0000 ug | INHALATION_SOLUTION | Freq: Two times a day (BID) | RESPIRATORY_TRACT | Status: DC
  Administered 2021-03-25 – 2021-03-26 (×2): 15 ug via RESPIRATORY_TRACT
  Filled 2021-03-25 (×2): qty 2

## 2021-03-25 MED ORDER — ASPIRIN EC 81 MG PO TBEC
81.0000 mg | DELAYED_RELEASE_TABLET | Freq: Every day | ORAL | Status: DC
  Administered 2021-03-26: 81 mg via ORAL
  Filled 2021-03-25 (×2): qty 1

## 2021-03-25 MED ORDER — TIZANIDINE HCL 2 MG PO TABS
4.0000 mg | ORAL_TABLET | Freq: Every evening | ORAL | Status: DC | PRN
  Administered 2021-03-26: 4 mg via ORAL
  Filled 2021-03-25: qty 2

## 2021-03-25 MED ORDER — ACETAMINOPHEN 650 MG RE SUPP: 650.0000 mg | Freq: Four times a day (QID) | RECTAL | Status: DC | PRN

## 2021-03-25 MED ORDER — DILTIAZEM HCL 25 MG/5ML IV SOLN
10.0000 mg | Freq: Once | INTRAVENOUS | Status: AC
  Administered 2021-03-25: 10 mg via INTRAVENOUS
  Filled 2021-03-25: qty 5

## 2021-03-25 MED ORDER — ALBUTEROL SULFATE (2.5 MG/3ML) 0.083% IN NEBU: 2.5000 mg | INHALATION_SOLUTION | RESPIRATORY_TRACT | Status: DC | PRN

## 2021-03-25 MED ORDER — ONDANSETRON HCL 4 MG/2ML IJ SOLN: 4.0000 mg | Freq: Four times a day (QID) | INTRAMUSCULAR | Status: DC | PRN

## 2021-03-25 MED ORDER — ONDANSETRON HCL 4 MG PO TABS
4.0000 mg | ORAL_TABLET | Freq: Four times a day (QID) | ORAL | Status: DC | PRN
Start: 2021-03-25 — End: 2021-03-26

## 2021-03-25 NOTE — H&P (Signed)
Patient Demographics:    [name], is a 80 y.o. male  MRN: [id number]   DOB - [birth year]  Admit Date - 03/25/2021  Outpatient Primary MD for the patient is Shirline Frees, MD   Assessment & Plan:    Principal Problem:   Paroxysmal A-fib with RVR Active Problems:   CAD-  S/P stenting of high OM1/ramus intermediate with 3.0 x 23 mm Xience Sierra DES/S/p CABG   History of  deep vein thrombosis (DVT)/H/o Provoked DVT in 2007 --after CABG,    Obstructive sleep apnea   Essential hypertension   BPH (benign prostatic hyperplasia)   Chronic diastolic heart failure (HCC)   Hx of CABG   COPD (chronic obstructive pulmonary disease) ([place])    1)PAFib/A fluter with RVR--check TSH and serum magnesium -Continue IV Cardizem drip and p.o. metoprolol  last known EF 55 to 60% with d1CHF from April 2018 echo -We will repeat echo this admission  CHA2DS2- VASc score   is = 5 (dCHF, HTN, CAD and age x 2)   This patients CHA2DS2-VASc Score and unadjusted Ischemic Stroke Rate (% per year) is equal to 7.2 % stroke rate/year from a score of 5  --Risk versus benefit of full anticoagulation discussed with patient and daughter awaiting their decision  2)CAD-- s/p CABG in 2007, S/P stenting of high OM1/ramus intermediate with 3.0 x 23 mm Xience Sierra DES/S/p CABG in 2019-----no ACS type symptoms at this time -Continue metoprolol and aspirin  3)COPD/OSA--- patient sees Dr. Lamonte Sakai pulmonologist---  -Chest x-ray with left base atelectasis versus pneumonia -Patient was started on doxycycline recently for COPD flareup by his pulmonologist Dr. Lamonte Sakai -No fevers, no leukocytosis, no productive cough, favor atelectasis over pneumonia -Continue doxycycline, continue bronchodilators, encourage CPAP use nightly  4)Chronic Diastolic  dysfunction CHF--- last known EF 55 to 60% with d1CHF from April 2018 echo -We will repeat echo this admission -Chest x-ray with cardiomegaly and vascular congestion without overt pulmonary edema, suspect vascular congestion secondary to A. fib with RVR -Continue Lasix  5)BPH--- stable, continue Flomax  6)H/o Ascending Aortic Aneurysm and small abdominal aortic aneurysm - -patient sees Dr.Bryan Bartle   7)H/o Provoked DVT in 2007 --after CABG, was rxed with Coumadin for 6 months at that time  Disposition/Need for in-Hospital Stay- patient unable to be discharged at this time due to A. fib with RVR requiring IV Cardizem for rate control  Dispo: The patient is from: Home              Anticipated d/c is to: Home              Anticipated d/c date is: 1 day              Patient currently is not medically stable to d/c. Barriers: Not Clinically Stable-    With History of - Reviewed by me  Past Medical History:  Diagnosis Date  . Allergy   . Arthritis   .  BPH (benign prostatic hyperplasia)   . CAD (coronary artery disease)    s/p CABG 2007; NSTEMI in setting of AFib with RVR 12/2009; cath 1/11: S-RCA ok with prox 30-40% and 50% stenoses; S-OM and RI  with patent OM limb but an occluded RI limb; S-D2 and L-LAD ok; EF 55%  . Cataract    removed both eyes   . Clotting disorder (HCC)    s/p CABG x 5- PE   . Colon cancer ([place]) 1992  . COPD (chronic obstructive pulmonary disease) ([place])   . Diverticulosis   . Essential hypertension   . Family history of breast cancer   . Family history of colon cancer   . Family history of lung cancer   . GERD (gastroesophageal reflux disease)   . History of pulmonary embolus (PE) 2007   Following CABG  . Hyperlipidemia   . 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 OSA on CPAP   . Paroxysmal atrial fibrillation (HCC)    Previously on Multaq, declines anticoagulation  . Personal history of colonic polyps 01/22/2000   Tubular adenoma  . RBBB (right  bundle branch block)   . Sleep apnea    no cpap   . 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   in situ-left forearm      Past Surgical History:  Procedure Laterality Date  . COLECTOMY  1992   w/ colostomy  related to colon cancer  . COLON SURGERY  1993   to take down Colostomy  . COLONOSCOPY    . CORONARY ARTERY BYPASS GRAFT  2007  . CORONARY STENT INTERVENTION N/A 10/07/2018   Procedure: CORONARY STENT INTERVENTION;  Surgeon: Adrian Prows, MD;  Location: [facility];  Service: Cardiovascular;  Laterality: N/A;  . HAND [place]   tendon repair and nerve; left  . INGUINAL HERNIA REPAIR  2009  . LAPAROSCOPIC CHOLECYSTECTOMY  2004  . POLYPECTOMY    . RIGHT/LEFT HEART CATH AND CORONARY/GRAFT ANGIOGRAPHY N/A 07/03/2017   Procedure: Right/Left Heart Cath and Coronary/Graft Angiography;  Surgeon: Leonie Man, MD;  Location: [facility];  Service: Cardiovascular;  Laterality: N/A;  . RIGHT/LEFT HEART CATH AND CORONARY/GRAFT ANGIOGRAPHY N/A 10/07/2018   Procedure: RIGHT/LEFT HEART CATH AND CORONARY/GRAFT ANGIOGRAPHY;  Surgeon: Adrian Prows, MD;  Location: [facility];  Service: Cardiovascular;  Laterality: N/A;  . UMBILICAL HERNIA REPAIR    . VENTRAL HERNIA REPAIR  2009      Chief Complaint  Patient presents with  . Irregular Heart Beat      HPI:    [name]  is a 80 y.o. male who is a reformed smoker with past medical history relevant for CAD status post CABG in 2007 and stents in 2019, ascending aortic aneurysm (follows Dr.Bryan Bartle),h/o colon CA, paroxysmal atrial fibrillation, COPD/OSA, obesity, BPH, HTN and chronic diastolic dysfunction CHF who presents to the ED with concerns about palpitations and tachycardia with heart rate up to 150s this morning -Denies frank chest pains, no leg pains or pleuritic symptoms, no dizziness or shortness of  breath -H/o Provoked DVT in 2007 --after CABG, was rxed with Coumadin for 6 months at that time -Patient was recently started on prednisone and doxycycline by pulmonologist Dr. Lamonte Sakai for COPD flareup apparently -Denies productive cough or fevers -In the ED serial EKG and heart monitor suggest A. fib/atrial flutter with RVR -  CXR suggestive of atelectasis versus pneumonia in the left base -CBC 9.8, troponin is not elevated -Patient had COVID-19 infection back in 08/2020 -Patient received IV Cardizem bolus for A. fib/a flutter with RVR in the ED, rate control remains challenging so patient was initiated on IV Cardizem drip -Additional history obtained from patient's daughter [name] at bedside    Review of systems:    In addition to the HPI above,   A full Review of  Systems was done, all other systems reviewed are negative except as noted above in HPI , .    Social History:  Reviewed by me    Social History   Tobacco Use  . Smoking status: Former Smoker    Packs/day: 1.00    Years: 45.00    Pack years: 45.00    Types: Cigarettes    Quit date: 12/18/1995    Years since quitting: 25.2  . Smokeless tobacco: Never Used  Substance Use Topics  . Alcohol use: No    Comment: quit in 1980       Family History :  Reviewed by me    Family History  Problem Relation Age of Onset  . Lung cancer Mother   . Heart disease Mother   . Emphysema Father   . Heart disease Father   . Cancer Sister 60       unknown type  . Breast cancer Sister        dx. >50  . Colon cancer Sister        dx. >50  . Breast cancer Sister        dx. >50  . Colon cancer Sister        dx. >50  . Colon cancer Cousin        dx. early 37s  . Esophageal cancer Neg Hx   . Rectal cancer Neg Hx   . Stomach cancer Neg Hx   . Colon polyps Neg Hx      Home Medications:   Prior to Admission medications   Medication Sig Start Date End Date Taking? Authorizing Provider  albuterol (PROVENTIL HFA;VENTOLIN HFA)  108 (90 Base) MCG/ACT inhaler Inhale 2 puffs into the lungs every 4 (four) hours as needed for wheezing or shortness of breath. 08/08/18   Collene Gobble, MD  albuterol (PROVENTIL) (2.5 MG/3ML) 0.083% nebulizer solution USE 1 VIAL IN NEBULIZER EVERY 6 HOURS AS NEEDED FOR WHEEZING OR SHORTNESS OF BREATH 12/12/20   Collene Gobble, MD  albuterol (VENTOLIN HFA) 108 (90 Base) MCG/ACT inhaler Inhale 2 puffs into the lungs every 6 (six) hours as needed for wheezing or shortness of breath. 10/04/20   Collene Gobble, MD  aspirin EC 81 MG tablet Take 1 tablet (81 mg total) by mouth daily. 06/27/18   Satira Sark, MD  atorvastatin (LIPITOR) 20 MG tablet Take 20 mg by mouth at bedtime.  03/31/18   [provider]  clonazePAM (KLONOPIN) 1 MG tablet Take 1 mg by mouth as needed. 08/20/20   [provider]  doxycycline (VIBRA-TABS) 100 MG tablet Take 1 tablet (100 mg total) by mouth 2 (two) times daily. 03/22/21   Collene Gobble, MD  fluticasone (FLONASE) 50 MCG/ACT nasal spray Place 2 sprays into both nostrils daily. Patient taking differently: Place 2 sprays into both nostrils as needed.  01/22/19   Juanito Doom, MD  furosemide (LASIX) 20 MG tablet Take 1 tablet (20 mg total) by mouth daily. 08/08/18   Satira Sark,  MD  gabapentin (NEURONTIN) 100 MG capsule Take 100 mg by mouth 2 (two) times daily.    [provider]  Garlic 3664 MG CAPS Take 1,000 mg by mouth daily.    [provider]  guaiFENesin (MUCINEX) 600 MG 12 hr tablet Take 600 mg by mouth 2 (two) times daily as needed.     [provider]  HYDROcodone-acetaminophen (NORCO/VICODIN) 5-325 MG tablet Take 1 tablet by mouth every 6 (six) hours as needed for moderate pain.    [provider]  isosorbide mononitrate (IMDUR) 30 MG 24 hr tablet Take 1 tablet by mouth once daily 04/14/19   Adrian Prows, MD  ketoconazole (NIZORAL) 2 % cream Apply 1 application topically daily.    [provider]  metoprolol succinate (TOPROL-XL) 50 MG 24 hr tablet Take 1 tablet by mouth twice daily 12/23/20   Patwardhan, Reynold Bowen, MD  Multiple Vitamin (MULTIVITAMIN WITH MINERALS) TABS tablet Take 1 tablet by mouth daily.    [provider]  nitroGLYCERIN (NITROSTAT) 0.4 MG SL tablet Place 1 tablet (0.4 mg total) under the tongue every 5 (five) minutes as needed for chest pain. 11/28/16   Imogene Burn, PA-C  Omega-3 Fatty Acids (FISH OIL) 1200 MG CAPS Take 1,200 mg by mouth daily.    [provider]  omeprazole (PRILOSEC) 20 MG capsule Take 20 mg by mouth every other day. 08/19/20   [provider]  potassium chloride (K-DUR) 10 MEQ tablet TAKE 1 TABLET BY MOUTH ONCE DAILY 01/16/19   Satira Sark, MD  predniSONE (DELTASONE) 10 MG tablet Take 4 tabs by mouth for 3 days, then 3 for 3 days, 2 for 3 days, 1 for 3 days and stop 03/22/21   Collene Gobble, MD  STIOLTO RESPIMAT 2.5-2.5 MCG/ACT AERS INHALE 2 PUFFS BY MOUTH ONCE DAILY 03/09/21   Collene Gobble, MD  tamsulosin (FLOMAX) 0.4 MG CAPS capsule Take 0.8 mg by mouth every evening.     [provider]  tiZANidine (ZANAFLEX) 4 MG tablet Take 4 mg by mouth at bedtime as needed for muscle spasms.    [provider]     Allergies:    No Known Allergies   Physical Exam:   Vitals  Blood pressure 116/71, pulse (!) 51, temperature (!) 97.3 F (36.3 C), temperature source Oral, resp. rate (!) 29, height 5\' 11"  (1.803 m), weight 105.2 kg, SpO2 96 %.  Physical Examination: General appearance - alert,   and in no distress Mental status - alert, oriented to person, place, and time,  Eyes - sclera anicteric Neck - supple, no JVD elevation , Chest -diminished in bases left more than right, no wheezing* Heart - S1 and S2 normal, irregularly irregular Abdomen - soft, nontender, nondistended, no masses or organomegaly Neurological - screening mental status exam normal, neck supple without rigidity, cranial nerves  II through XII intact, DTR's normal and symmetric Extremities - no pedal edema noted, intact peripheral pulses  Skin - warm, dry     Data Review:    CBC Recent Labs  Lab 03/25/21 1347  WBC 9.8  HGB 14.5  HCT 44.3  PLT 235  MCV 85.0  MCH 27.8  MCHC 32.7  RDW 15.2  LYMPHSABS 1.2  MONOABS 0.4  EOSABS 0.0  BASOSABS 0.0   ------------------------------------------------------------------------------------------------------------------  Chemistries  Recent Labs  Lab 03/25/21 1306  NA 137  K 4.0  CL 103  CO2 23  GLUCOSE 125*  BUN 19  CREATININE  1.20  CALCIUM 9.2  AST 28  ALT 34  ALKPHOS 54  BILITOT 0.5   ------------------------------------------------------------------------------------------------------------------ estimated creatinine clearance is 60.6 mL/min (by C-G formula based on SCr of 1.2 mg/dL). ------------------------------------------------------------------------------------------------------------------ No results for input(s): TSH, T4TOTAL, T3FREE, THYROIDAB in the last 72 hours.  Invalid input(s): FREET3   Coagulation profile No results for input(s): INR, PROTIME in the last 168 hours. ------------------------------------------------------------------------------------------------------------------- No results for input(s): DDIMER in the last 72 hours. -------------------------------------------------------------------------------------------------------------------  Cardiac Enzymes No results for input(s): CKMB, TROPONINI, MYOGLOBIN in the last 168 hours.  Invalid input(s): CK ------------------------------------------------------------------------------------------------------------------    Component Value Date/Time   BNP 50.6 12/07/2011 1500    Urinalysis    Component Value Date/Time   COLORURINE YELLOW 01/14/2010 1700   APPEARANCEUR CLEAR 01/14/2010 1700   LABSPEC 1.020 06/18/2010 1857   PHURINE 5.5 06/18/2010 1857   GLUCOSEU  NEGATIVE 06/18/2010 1857   HGBUR NEGATIVE 06/18/2010 1857   BILIRUBINUR NEGATIVE 06/18/2010 1857   KETONESUR NEGATIVE 06/18/2010 1857   PROTEINUR NEGATIVE 06/18/2010 1857   UROBILINOGEN 0.2 06/18/2010 1857   NITRITE NEGATIVE 06/18/2010 1857   LEUKOCYTESUR  06/18/2010 1857    NEGATIVE Biochemical Testing Only. Please order routine urinalysis from main lab if confirmatory testing is needed.     Imaging Results:    DG Chest Port 1 View  Result Date: 03/25/2021 CLINICAL DATA:  Shortness of breath.  Chest pain. EXAM: PORTABLE CHEST 1 VIEW COMPARISON:  10/04/2020 FINDINGS: 1249 hours. Low lung volumes. The cardio pericardial silhouette is enlarged. There is pulmonary vascular congestion without overt pulmonary edema. Stable tiny nodular density right mid lung. Left base atelectasis or infiltrate noted. The visualized bony structures of the thorax show no acute abnormality. Telemetry leads overlie the chest. IMPRESSION: 1. Low volume film with cardiomegaly and vascular congestion. 2. Patchy airspace disease in the left base compatible with atelectasis or pneumonia. 3. Tiny nodular density peripheral right mid lung is stable, suggesting benign etiology. . Electronically Signed   By: Misty Stanley M.D.   On: 03/25/2021 13:14    Radiological Exams on Admission: DG Chest Port 1 View  Result Date: 03/25/2021 CLINICAL DATA:  Shortness of breath.  Chest pain. EXAM: PORTABLE CHEST 1 VIEW COMPARISON:  10/04/2020 FINDINGS: 1249 hours. Low lung volumes. The cardio pericardial silhouette is enlarged. There is pulmonary vascular congestion without overt pulmonary edema. Stable tiny nodular density right mid lung. Left base atelectasis or infiltrate noted. The visualized bony structures of the thorax show no acute abnormality. Telemetry leads overlie the chest. IMPRESSION: 1. Low volume film with cardiomegaly and vascular congestion. 2. Patchy airspace disease in the left base compatible with atelectasis or pneumonia.  3. Tiny nodular density peripheral right mid lung is stable, suggesting benign etiology. . Electronically Signed   By: Misty Stanley M.D.   On: 03/25/2021 13:14   DVT Prophylaxis -SCD /heparin AM Labs Ordered, also please review Full Orders  Family Communication: Admission, patients condition and plan of care including tests being ordered have been discussed with the patient and patient's daughter [name] at bedside who indicate understanding and agree with the plan   Code Status - Full Code  Likely DC to home with better heart rate control  Condition   stable*  Roxan Hockey M.D on 03/25/2021 at 3:09 PM Go to [email or web address] -  for contact info  [facility] - Office  [phone]

## 2021-03-25 NOTE — ED Triage Notes (Signed)
Pt states his heart rate has been fluctuating from the 50s to 150 beginning this morning.  Pt states he has no history of Afib, nor does he take a blood thinner.  Pt states he had 40 mg of prednisone today, and he denies chest pain, shortness of breath, and dizziness.

## 2021-03-25 NOTE — ED Notes (Signed)
Spoke with Dr. Maurene Capes, aware of pt IV medication infusion and rate and most recent VS. No new orders at this time

## 2021-03-25 NOTE — ED Notes (Signed)
Pt's spouse to bedside, pt and spouse updated on POC. Pt denies further needs at this time. Side rails up x 2, bed locked and low, call bell within reach. Will continue to monitor.

## 2021-03-26 ENCOUNTER — Observation Stay (HOSPITAL_BASED_OUTPATIENT_CLINIC_OR_DEPARTMENT_OTHER): Payer: Medicare PPO

## 2021-03-26 DIAGNOSIS — I5043 Acute on chronic combined systolic (congestive) and diastolic (congestive) heart failure: Secondary | ICD-10-CM | POA: Diagnosis present

## 2021-03-26 DIAGNOSIS — R9431 Abnormal electrocardiogram [ECG] [EKG]: Secondary | ICD-10-CM

## 2021-03-26 DIAGNOSIS — I480 Paroxysmal atrial fibrillation: Secondary | ICD-10-CM | POA: Diagnosis not present

## 2021-03-26 LAB — CBC
HCT: 43.9 % (ref 39.0–52.0)
Hemoglobin: 14.3 g/dL (ref 13.0–17.0)
MCH: 27.7 pg (ref 26.0–34.0)
MCHC: 32.6 g/dL (ref 30.0–36.0)
MCV: 84.9 fL (ref 80.0–100.0)
Platelets: 221 10*3/uL (ref 150–400)
RBC: 5.17 MIL/uL (ref 4.22–5.81)
RDW: 15.2 % (ref 11.5–15.5)
WBC: 9.2 10*3/uL (ref 4.0–10.5)
nRBC: 0 % (ref 0.0–0.2)

## 2021-03-26 LAB — ECHOCARDIOGRAM COMPLETE
Area-P 1/2: 3.51 cm2
Height: 71 in
S' Lateral: 3.26 cm
Weight: 3753.11 oz

## 2021-03-26 LAB — BASIC METABOLIC PANEL
Anion gap: 9 (ref 5–15)
BUN: 20 mg/dL (ref 8–23)
CO2: 25 mmol/L (ref 22–32)
Calcium: 8.5 mg/dL — ABNORMAL LOW (ref 8.9–10.3)
Chloride: 100 mmol/L (ref 98–111)
Creatinine, Ser: 1.08 mg/dL (ref 0.61–1.24)
GFR, Estimated: >60 mL/min (ref >60)
Glucose, Bld: 93 mg/dL (ref 70–99)
Potassium: 3.4 mmol/L — ABNORMAL LOW (ref 3.5–5.1)
Sodium: 134 mmol/L — ABNORMAL LOW (ref 135–145)

## 2021-03-26 MED ORDER — ACETAMINOPHEN 325 MG PO TABS: 650.0000 mg | ORAL_TABLET | Freq: Four times a day (QID) | ORAL | 0 refills | Status: DC | PRN

## 2021-03-26 MED ORDER — METOPROLOL SUCCINATE ER 25 MG PO TB24: 25.0000 mg | ORAL_TABLET | ORAL | 11 refills | Status: DC

## 2021-03-26 MED ORDER — ASPIRIN EC 81 MG PO TBEC: 81.0000 mg | DELAYED_RELEASE_TABLET | Freq: Every day | ORAL | 3 refills | Status: DC

## 2021-03-26 MED ORDER — METOPROLOL SUCCINATE ER 25 MG PO TB24
25.0000 mg | ORAL_TABLET | Freq: Once | ORAL | Status: AC
  Administered 2021-03-26: 25 mg via ORAL
  Filled 2021-03-26: qty 1

## 2021-03-26 MED ORDER — POTASSIUM CHLORIDE CRYS ER 20 MEQ PO TBCR
40.0000 meq | EXTENDED_RELEASE_TABLET | Freq: Once | ORAL | Status: AC
  Administered 2021-03-26: 40 meq via ORAL
  Filled 2021-03-26: qty 2

## 2021-03-26 MED ORDER — METOPROLOL SUCCINATE ER 50 MG PO TB24: 50.0000 mg | ORAL_TABLET | ORAL | 2 refills | Status: DC

## 2021-03-26 MED ORDER — OMEPRAZOLE 20 MG PO CPDR: 20.0000 mg | DELAYED_RELEASE_CAPSULE | Freq: Every day | ORAL | 3 refills | Status: AC

## 2021-03-26 MED ORDER — CHLORHEXIDINE GLUCONATE CLOTH 2 % EX PADS
6.0000 | MEDICATED_PAD | Freq: Every day | CUTANEOUS | Status: DC
  Administered 2021-03-26: 6 via TOPICAL

## 2021-03-26 NOTE — Progress Notes (Signed)
SATURATION QUALIFICATIONS:  Patient Saturations on Room Air at Rest = 97%  Patient Saturations on Room Air while Ambulating =95%  Patient Saturations on 2 Liters of oxygen while Ambulating = 97%  Please briefly explain why patient needs home oxygen: Pt stated recent bronchitis, required O2 during hospitalization due to increased respiratory effort.

## 2021-03-26 NOTE — Discharge Summary (Signed)
[name], is a 80 y.o. male  DOB 16-Jan-1941  MRN [id number].  Admission date:  03/25/2021  Admitting Physician   Denton Brick, MD  Discharge Date:  03/26/2021   Primary MD  Shirline Frees, MD  Recommendations for primary care physician for things to follow:   1)Very low-salt diet advised 2)Weigh yourself daily, call if you gain more than 3 pounds in 1 day or more than 5 pounds in 1 week as your diuretic medications may need to be adjusted 3)Limit your Fluid  intake to no more than 60 ounces (1.8 Liters) per day 4) please increase Toprol-XL/metoprolol XL to 75 mg twice daily ---take 1 tablet of Toprol-XL 50 mg along with Toprol-XL 25 mg twice daily to make it 75 mg twice daily 5) please follow-up with your cardiologist Dr. Einar Gip within a week for reevaluation and recheck--- also discuss possible anticoagulation with Xarelto or apixaban with Dr. Einar Gip 6) repeat BMP and CBC blood work within a week advised   Admission Diagnosis  New onset a-fib [facility]) [I48.91]   Discharge Diagnosis  New onset a-fib ([place]) [I48.91]    Principal Problem:   Paroxysmal A-fib with RVR Active Problems:   CAD-  S/P stenting of high OM1/ramus intermediate with 3.0 x 23 mm Xience [place] DES/S/p CABG   Acute on chronic combined systolic and diastolic CHF (congestive heart failure) --EF 35 to 40%   History of  deep vein thrombosis (DVT)/H/o Provoked DVT in 2007 --after CABG,    Obstructive sleep apnea   Essential hypertension   BPH (benign prostatic hyperplasia)   Chronic diastolic heart failure (HCC)   Hx of CABG   COPD (chronic obstructive pulmonary disease) ([place])      Past Medical History:  Diagnosis Date  . Allergy   . Arthritis   . BPH (benign prostatic hyperplasia)   . CAD (coronary artery disease)    s/p CABG 2007; NSTEMI in setting of AFib with RVR 12/2009; cath 1/11: S-RCA ok with prox 30-40% and 50%  stenoses; S-OM and RI  with patent OM limb but an occluded RI limb; S-D2 and L-LAD ok; EF 55%  . Cataract    removed both eyes   . Clotting disorder (HCC)    s/p CABG x 5- PE   . Colon cancer ([place]) 1992  . COPD (chronic obstructive pulmonary disease) ([place])   . Diverticulosis   . Essential hypertension   . Family history of breast cancer   . Family history of colon cancer   . Family history of lung cancer   . GERD (gastroesophageal reflux disease)   . History of pulmonary embolus (PE) 2007   Following CABG  . Hyperlipidemia   . 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 OSA on CPAP   . Paroxysmal atrial fibrillation (HCC)    Previously on Multaq, declines anticoagulation  . Personal history of colonic polyps 01/22/2000   Tubular adenoma  . RBBB (right bundle branch block)   . Sleep apnea    no  cpap   . 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   in situ-left forearm    Past Surgical History:  Procedure Laterality Date  . COLECTOMY  1992   w/ colostomy  related to colon cancer  . COLON SURGERY  1993   to take down Colostomy  . COLONOSCOPY    . CORONARY ARTERY BYPASS GRAFT  2007  . CORONARY STENT INTERVENTION N/A 10/07/2018   Procedure: CORONARY STENT INTERVENTION;  Surgeon: Adrian Prows, MD;  Location: [facility];  Service: Cardiovascular;  Laterality: N/A;  . HAND [place]   tendon repair and nerve; left  . INGUINAL HERNIA REPAIR  2009  . LAPAROSCOPIC CHOLECYSTECTOMY  2004  . POLYPECTOMY    . RIGHT/LEFT HEART CATH AND CORONARY/GRAFT ANGIOGRAPHY N/A 07/03/2017   Procedure: Right/Left Heart Cath and Coronary/Graft Angiography;  Surgeon: Leonie Man, MD;  Location: [facility];  Service: Cardiovascular;  Laterality: N/A;  . RIGHT/LEFT HEART CATH AND CORONARY/GRAFT ANGIOGRAPHY N/A 10/07/2018   Procedure: RIGHT/LEFT HEART CATH AND CORONARY/GRAFT  ANGIOGRAPHY;  Surgeon: Adrian Prows, MD;  Location: [facility];  Service: Cardiovascular;  Laterality: N/A;  . UMBILICAL HERNIA REPAIR    . VENTRAL HERNIA REPAIR  2009      HPI  from the history and physical done on the day of admission:    [name]  is a 80 y.o. male who is a reformed smoker with past medical history relevant for CAD status post CABG in 2007 and stents in 2019, ascending aortic aneurysm (follows Dr.Bryan Bartle),h/o colon CA, paroxysmal atrial fibrillation, COPD/OSA, obesity, BPH, HTN and chronic diastolic dysfunction CHF who presents to the ED with concerns about palpitations and tachycardia with heart rate up to 150s this morning -Denies frank chest pains, no leg pains or pleuritic symptoms, no dizziness or shortness of breath -H/o Provoked DVT in 2007 --after CABG, was rxed with Coumadin for 6 months at that time -Patient was recently started on prednisone and doxycycline by pulmonologist Dr. Lamonte Sakai for COPD flareup apparently -Denies productive cough or fevers -In the ED serial EKG and heart monitor suggest A. fib/atrial flutter with RVR -CXR suggestive of atelectasis versus pneumonia in the left base -CBC 9.8, troponin is not elevated -Patient had COVID-19 infection back in 08/2020 -Patient received IV Cardizem bolus for A. fib/a flutter with RVR in the ED, rate control remains challenging so patient was initiated on IV Cardizem drip -Additional history obtained from patient's daughter [name] at bedside      Hospital Course:         1)PAFib/A fluter with RVR--rate control improved on IV Cardizem the patient remains in A. Fib -Echo with EF of 35 to 40% with global hypokinesis -EF was 55 to 60% back in April 2018 -Unable to use oral Cardizem for rate control due to low EF -PTA patient was on Toprol XL 50 mg, will titrate Toprol up to 75 mg for better rate control -CHA2DS2- VASc score   is = 5 (dCHF, HTN, CAD and age x 2)   This patients CHA2DS2-VASc  Score and unadjusted Ischemic Stroke Rate (% per year) is equal to 7.2 % stroke rate/year from a score of 5  --Risk versus benefit of full anticoagulation discussed with patient and daughter [name] who is an Therapist, sports--- they declined for anticoagulation at this -They want to talk further with PCP prior to making a  decision -TSH is 0.63  2)CAD-- s/p CABG in 2007, S/P stenting of high OM1/ramus intermediate with 3.0 x 23 mm Xience [place] DES/S/p CABG in 2019-----no ACS type symptoms at this time -Continue metoprolol and aspirin  3)COPD/OSA--- patient sees Dr. Lamonte Sakai pulmonologist---  -Chest x-ray with left base atelectasis versus pneumonia -Patient was started on doxycycline recently for COPD flareup by his pulmonologist Dr. Lamonte Sakai -No fevers, no leukocytosis, PCT < 0.10,  no productive cough, favor atelectasis over pneumonia -Continue doxycycline, continue bronchodilators, encourage CPAP use nightly  4)HFrEF-patient appears to have combined systolic and chronic Diastolic dysfunction CHF---  --Echo this admission with EF of 35 to 40% with global hypokinesis -EF was 55 to 60% back in April 2018 -Chest x-ray with cardiomegaly and vascular congestion without overt pulmonary edema, suspect vascular congestion secondary to A. fib with RVR -Treated with Lasix  5)BPH--- stable, continue Flomax  6)H/o Ascending Aortic Aneurysm and small abdominal aortic aneurysm - -patient sees Dr.Bryan Bartle   7)H/o Provoked DVT in 2007 --after CABG, was rxed with Coumadin for 6 months at that time  Disposition--discharge home Dispo: The patient is from: Home  Anticipated d/c is to: Home  Discharge Condition: Stable,  Follow UP   Follow-up Information    Adrian Prows, MD. Schedule an appointment as soon as possible for a visit in 1 week(s).   Specialty: Cardiology Contact information: [address] [place] [address] [phone]               Diet and Activity  recommendation:  As advised  Discharge Instructions    Discharge Instructions    Call MD for:  difficulty breathing, headache or visual disturbances   Complete by: As directed    Call MD for:  persistant dizziness or light-headedness   Complete by: As directed    Call MD for:  persistant nausea and vomiting   Complete by: As directed    Call MD for:  temperature >100.4   Complete by: As directed    Diet - low sodium heart healthy   Complete by: As directed    Discharge instructions   Complete by: As directed    1)Very low-salt diet advised 2)Weigh yourself daily, call if you gain more than 3 pounds in 1 day or more than 5 pounds in 1 week as your diuretic medications may need to be adjusted 3)Limit your Fluid  intake to no more than 60 ounces (1.8 Liters) per day 4) please increase Toprol-XL/metoprolol XL to 75 mg twice daily ---take 1 tablet of Toprol-XL 50 mg along with Toprol-XL 25 mg twice daily to make it 75 mg twice daily 5) please follow-up with your cardiologist Dr. Einar Gip within a week for reevaluation and recheck--- also discuss possible anticoagulation with Xarelto or apixaban with Dr. Einar Gip 6) repeat BMP and CBC blood work within a week advised   Increase activity slowly   Complete by: As directed        Discharge Medications     Allergies as of 03/26/2021   No Known Allergies     Medication List    STOP taking these medications   Garlic 7893 MG Caps   predniSONE 10 MG tablet Commonly known as: DELTASONE     TAKE these medications   acetaminophen 325 MG tablet Commonly known as: TYLENOL Take 2 tablets (650 mg total) by mouth every 6 (six) hours as needed for mild pain (or Fever >/= 101).   albuterol 108 (90 Base) MCG/ACT inhaler  Commonly known as: VENTOLIN HFA Inhale 2 puffs into the lungs every 4 (four) hours as needed for wheezing or shortness of breath.   albuterol (2.5 MG/3ML) 0.083% nebulizer solution Commonly known as: PROVENTIL USE 1 VIAL IN  NEBULIZER EVERY 6 HOURS AS NEEDED FOR WHEEZING OR SHORTNESS OF BREATH   aspirin EC 81 MG tablet Take 1 tablet (81 mg total) by mouth daily with breakfast. What changed: when to take this   atorvastatin 20 MG tablet Commonly known as: LIPITOR Take 20 mg by mouth at bedtime.   clonazePAM 1 MG tablet Commonly known as: KLONOPIN Take 1 mg by mouth as needed.   doxycycline 100 MG tablet Commonly known as: VIBRA-TABS Take 1 tablet (100 mg total) by mouth 2 (two) times daily.   Fish Oil 1200 MG Caps Take 1,200 mg by mouth daily.   fluticasone 50 MCG/ACT nasal spray Commonly known as: Flonase Place 2 sprays into both nostrils daily. What changed:   when to take this  reasons to take this   gabapentin 100 MG capsule Commonly known as: NEURONTIN Take 100 mg by mouth 3 (three) times daily.   guaiFENesin 600 MG 12 hr tablet Commonly known as: MUCINEX Take 600 mg by mouth 2 (two) times daily as needed.   HYDROcodone-acetaminophen 5-325 MG tablet Commonly known as: NORCO/VICODIN Take 1 tablet by mouth every 6 (six) hours as needed for moderate pain.   isosorbide mononitrate 30 MG 24 hr tablet Commonly known as: IMDUR Take 1 tablet by mouth once daily   ketoconazole 2 % cream Commonly known as: NIZORAL Apply 1 application topically daily.   metoprolol succinate 50 MG 24 hr tablet Commonly known as: TOPROL-XL Take 1 tablet (50 mg total) by mouth See admin instructions. Take 1 tablet along with Toprol-XL 25 mg twice daily to make it 75 mg twice daily What changed:   when to take this  additional instructions   metoprolol succinate 25 MG 24 hr tablet Commonly known as: Toprol XL Take 1 tablet (25 mg total) by mouth See admin instructions. Take 1 tablet along with Toprol-XL 50 mg twice daily to make it 75 mg twice daily What changed: You were already taking a medication with the same name, and this prescription was added. Make sure you understand how and when to take  each.   multivitamin with minerals Tabs tablet Take 1 tablet by mouth daily.   nitroGLYCERIN 0.4 MG SL tablet Commonly known as: NITROSTAT Place 1 tablet (0.4 mg total) under the tongue every 5 (five) minutes as needed for chest pain.   omeprazole 20 MG capsule Commonly known as: PRILOSEC Take 1 capsule (20 mg total) by mouth daily. What changed: when to take this   Potassium Chloride ER 20 MEQ Tbcr Take 1 tablet by mouth daily.   Stiolto Respimat 2.5-2.5 MCG/ACT Aers Generic drug: Tiotropium Bromide-Olodaterol INHALE 2 PUFFS BY MOUTH ONCE DAILY What changed: See the new instructions.   tamsulosin 0.4 MG Caps capsule Commonly known as: FLOMAX Take 0.8 mg by mouth every evening.   tiZANidine 4 MG tablet Commonly known as: ZANAFLEX Take 4 mg by mouth at bedtime as needed for muscle spasms.   torsemide 20 MG tablet Commonly known as: DEMADEX Take 40 mg by mouth daily.       Major procedures and Radiology Reports - PLEASE review detailed and final reports for all details, in brief -    DG Chest Port 1 View  Result Date: 03/25/2021 CLINICAL DATA:  Shortness  of breath.  Chest pain. EXAM: PORTABLE CHEST 1 VIEW COMPARISON:  10/04/2020 FINDINGS: 1249 hours. Low lung volumes. The cardio pericardial silhouette is enlarged. There is pulmonary vascular congestion without overt pulmonary edema. Stable tiny nodular density right mid lung. Left base atelectasis or infiltrate noted. The visualized bony structures of the thorax show no acute abnormality. Telemetry leads overlie the chest. IMPRESSION: 1. Low volume film with cardiomegaly and vascular congestion. 2. Patchy airspace disease in the left base compatible with atelectasis or pneumonia. 3. Tiny nodular density peripheral right mid lung is stable, suggesting benign etiology. . Electronically Signed   By: Misty Stanley M.D.   On: 03/25/2021 13:14   ECHOCARDIOGRAM COMPLETE  Result Date: 03/26/2021    ECHOCARDIOGRAM REPORT   Patient  Name:   [name] Date of Exam: 03/26/2021 Medical Rec #:  [id number]       Height:       71.0 in Accession #:    [id number]      Weight:       234.6 lb Date of Birth:  [birth year]       BSA:          2.256 m Patient Age:    26 years        BP:           133/87 mmHg Patient Gender: M               HR:           55 bpm. Exam Location:  [facility] Procedure: 2D Echo, Cardiac Doppler and Color Doppler Indications:    Abnormal EKG  History:        Patient has prior history of Echocardiogram examinations, most                 recent 03/19/2017. CHF, CAD, Prior CABG, COPD, Arrythmias:Atrial                 Fibrillation; Risk Factors:Sleep Apnea, Obesity and Former                 Smoker.  Sonographer:    Dustin Flock RDCS Referring Phys: [place]  1. Left ventricular ejection fraction, by estimation, is 35 to 40%. The left ventricle has moderately decreased function. The left ventricle demonstrates global hypokinesis. There is mild left ventricular hypertrophy. Left ventricular diastolic parameters are indeterminate.  2. Right ventricular systolic function is normal. The right ventricular size is normal.  3. Left atrial size was mildly dilated.  4. The mitral valve is normal in structure. No evidence of mitral valve regurgitation. No evidence of mitral stenosis.  5. The aortic valve is tricuspid. There is mild calcification of the aortic valve. Aortic valve regurgitation is trivial. Mild aortic valve sclerosis is present, with no evidence of aortic valve stenosis.  6. The inferior vena cava is normal in size with greater than 50% respiratory variability, suggesting right atrial pressure of 3 mmHg. FINDINGS  Left Ventricle: Left ventricular ejection fraction, by estimation, is 35 to 40%. The left ventricle has moderately decreased function. The left ventricle demonstrates global hypokinesis. The left ventricular internal cavity size was normal in size. There is mild left ventricular  hypertrophy. Left ventricular diastolic parameters are indeterminate. Right Ventricle: The right ventricular size is normal. No increase in right ventricular wall thickness. Right ventricular systolic function is normal. Left Atrium: Left atrial size was mildly dilated. Right Atrium: Right atrial size was normal in size. Pericardium: There  is no evidence of pericardial effusion. Mitral Valve: The mitral valve is normal in structure. No evidence of mitral valve regurgitation. No evidence of mitral valve stenosis. Tricuspid Valve: The tricuspid valve is normal in structure. Tricuspid valve regurgitation is not demonstrated. No evidence of tricuspid stenosis. Aortic Valve: The aortic valve is tricuspid. There is mild calcification of the aortic valve. Aortic valve regurgitation is trivial. Mild aortic valve sclerosis is present, with no evidence of aortic valve stenosis. Pulmonic Valve: The pulmonic valve was normal in structure. Pulmonic valve regurgitation is not visualized. No evidence of pulmonic stenosis. Aorta: The aortic root is normal in size and structure. Venous: The inferior vena cava is normal in size with greater than 50% respiratory variability, suggesting right atrial pressure of 3 mmHg. IAS/Shunts: No atrial level shunt detected by color flow Doppler.  LEFT VENTRICLE PLAX 2D LVIDd:         5.05 cm  Diastology LVIDs:         3.26 cm  LV e' medial:    7.29 cm/s LV PW:         1.46 cm  LV E/e' medial:  13.9 LV IVS:        1.23 cm  LV e' lateral:   7.94 cm/s LVOT diam:     2.40 cm  LV E/e' lateral: 12.7 LV SV:         46 LV SV Index:   20 LVOT Area:     4.52 cm  RIGHT VENTRICLE RV Basal diam:  3.14 cm RV S prime:     8.38 cm/s TAPSE (M-mode): 1.8 cm LEFT ATRIUM             Index       RIGHT ATRIUM           Index LA diam:        3.90 cm 1.73 cm/m  RA Area:     18.50 cm LA Vol (A2C):   66.5 ml 29.47 ml/m RA Volume:   49.30 ml  21.85 ml/m LA Vol (A4C):   70.7 ml 31.33 ml/m LA Biplane Vol: 70.1 ml 31.07  ml/m  AORTIC VALVE LVOT Vmax:   57.40 cm/s LVOT Vmean:  36.300 cm/s LVOT VTI:    0.101 m  AORTA Ao Root diam: 4.30 cm MITRAL VALVE MV Area (PHT): 3.51 cm     SHUNTS MV Decel Time: 216 msec     Systemic VTI:  0.10 m MV E velocity: 101.00 cm/s  Systemic Diam: 2.40 cm MV A velocity: 28.70 cm/s MV E/A ratio:  3.52 Jenkins Rouge MD Electronically signed by Jenkins Rouge MD Signature Date/Time: 03/26/2021/10:36:24 AM    Final    Micro Results   Recent Results (from the past 240 hour(s))  Resp Panel by RT-PCR (Flu A&B, Covid) Nasopharyngeal Swab     Status: None   Collection Time: 03/25/21  1:22 PM   Specimen: Nasopharyngeal Swab; Nasopharyngeal(NP) swabs in vial transport medium  Result Value Ref Range Status   SARS Coronavirus 2 by RT PCR NEGATIVE NEGATIVE Final    Comment: (NOTE) SARS-CoV-2 target nucleic acids are NOT DETECTED.  The SARS-CoV-2 RNA is generally detectable in upper respiratory specimens during the acute phase of infection. The lowest concentration of SARS-CoV-2 viral copies this assay can detect is 138 copies/mL. A negative result does not preclude SARS-Cov-2 infection and should not be used as the sole basis for treatment or other patient management decisions. A negative result may occur with  improper  specimen collection/handling, submission of specimen other than nasopharyngeal swab, presence of viral mutation(s) within the areas targeted by this assay, and inadequate number of viral copies(<138 copies/mL). A negative result must be combined with clinical observations, patient history, and epidemiological information. The expected result is Negative.  Fact Sheet for Patients:  EntrepreneurPulse.com.au  Fact Sheet for Healthcare Providers:  IncredibleEmployment.be  This test is no t yet approved or cleared by the [place] FDA and  has been authorized for detection and/or diagnosis of SARS-CoV-2 by FDA under an Emergency Use  Authorization (EUA). This EUA will remain  in effect (meaning this test can be used) for the duration of the COVID-19 declaration under Section 564(b)(1) of the Act, 21 U.S.C.section 360bbb-3(b)(1), unless the authorization is terminated  or revoked sooner.       Influenza A by PCR NEGATIVE NEGATIVE Final   Influenza B by PCR NEGATIVE NEGATIVE Final    Comment: (NOTE) The Xpert Xpress SARS-CoV-2/FLU/RSV plus assay is intended as an aid in the diagnosis of influenza from Nasopharyngeal swab specimens and should not be used as a sole basis for treatment. Nasal washings and aspirates are unacceptable for Xpert Xpress SARS-CoV-2/FLU/RSV testing.  Fact Sheet for Patients: EntrepreneurPulse.com.au  Fact Sheet for Healthcare Providers: IncredibleEmployment.be  This test is not yet approved or cleared by the [place] FDA and has been authorized for detection and/or diagnosis of SARS-CoV-2 by FDA under an Emergency Use Authorization (EUA). This EUA will remain in effect (meaning this test can be used) for the duration of the COVID-19 declaration under Section 564(b)(1) of the Act, 21 U.S.C. section 360bbb-3(b)(1), unless the authorization is terminated or revoked.  Performed at [facility], [address], [place], [place] [address]   MRSA PCR Screening     Status: None   Collection Time: 03/25/21  4:00 PM   Specimen: Nasal Mucosa; Nasopharyngeal  Result Value Ref Range Status   MRSA by PCR NEGATIVE NEGATIVE Final    Comment:        The GeneXpert MRSA Assay (FDA approved for NASAL specimens only), is one component of a comprehensive MRSA colonization surveillance program. It is not intended to diagnose MRSA infection nor to guide or monitor treatment for MRSA infections. Performed at [facility], [address] [place], [place] [address]     Today   Subjective    Maxson Oddo today has no new concerns  -No fever  Or chills    No Nausea, Vomiting or Diarrhea -Patient's daughter [name] who is an Therapist, sports is at bedside -Patient ambulated around the unit without significant dyspnea on exertion, no chest pains, no palpitations, no dizziness, no hypoxia -O2 sats while ambulating on post ambulation 95 to 97% on room air      Patient has been seen and examined prior to discharge   Objective   Blood pressure 132/73, pulse 94, temperature (!) 97.3 F (36.3 C), temperature source Oral, resp. rate 20, height 5\' 11"  (1.803 m), weight 106.4 kg, SpO2 95 %.   Intake/Output Summary (Last 24 hours) at 03/26/2021 1239 Last data filed at 03/26/2021 1041 Gross per 24 hour  Intake 1764.34 ml  Output 1375 ml  Net 389.34 ml    Exam Gen:- Awake Alert, no acute distress, speaking in complete sentences HEENT:- [place].AT, No sclera icterus Neck-Supple Neck,No JVD,.  Lungs-improved air movement, no wheezing  CV- S1, S2 normal, irregularly irregular  abd-  +ve B.Sounds, Abd Soft, No tenderness,    Extremity/Skin:- No  edema,  good pulses Psych-affect is appropriate, oriented x3 Neuro-no new focal deficits, no tremors    Data Review   CBC w Diff:  Lab Results  Component Value Date   WBC 9.2 03/26/2021   HGB 14.3 03/26/2021   HCT 43.9 03/26/2021   PLT 221 03/26/2021   LYMPHOPCT 12 03/25/2021   MONOPCT 4 03/25/2021   EOSPCT 0 03/25/2021   BASOPCT 0 03/25/2021    CMP:  Lab Results  Component Value Date   NA 134 (L) 03/26/2021   K 3.4 (L) 03/26/2021   CL 100 03/26/2021   CO2 25 03/26/2021   BUN 20 03/26/2021   CREATININE 1.08 03/26/2021   CREATININE 1.15 10/13/2012   PROT 7.2 03/25/2021   ALBUMIN 3.7 03/25/2021   BILITOT 0.5 03/25/2021   ALKPHOS 54 03/25/2021   AST 28 03/25/2021   ALT 34 03/25/2021  .   Total Discharge time is about 33 minutes  Roxan Hockey M.D on 03/26/2021 at 12:39 PM  Go to [email or web address] -  for contact info  [facility] - Office  [phone]

## 2021-03-26 NOTE — Progress Notes (Signed)
  Echocardiogram 2D Echocardiogram has been performed.  [name] 03/26/2021, 9:32 AM

## 2021-03-26 NOTE — Discharge Instructions (Signed)
1)Very low-salt diet advised 2)Weigh yourself daily, call if you gain more than 3 pounds in 1 day or more than 5 pounds in 1 week as your diuretic medications may need to be adjusted 3)Limit your Fluid  intake to no more than 60 ounces (1.8 Liters) per day 4) please increase Toprol-XL/metoprolol XL to 75 mg twice daily ---take 1 tablet of Toprol-XL 50 mg along with Toprol-XL 25 mg twice daily to make it 75 mg twice daily 5) please follow-up with your cardiologist Dr. Einar Gip within a week for reevaluation and recheck--- also discuss possible anticoagulation with Xarelto or apixaban with Dr. Einar Gip 6) repeat BMP and CBC blood work within a week advised

## 2021-03-29 DIAGNOSIS — I4819 Other persistent atrial fibrillation: Secondary | ICD-10-CM | POA: Diagnosis not present

## 2021-03-29 NOTE — Progress Notes (Signed)
Primary Physician:  Shirline Frees, MD   Patient ID: [name], male    DOB: 01-24-1941, 80 y.o.   MRN: [id number]  Subjective:    Chief Complaint  Patient presents with  . Atrial Fibrillation  . Coronary Artery Disease  . Hospitalization Follow-up    HPI: [name]  is a 80 y.o. male  with known coronary artery disease and CABG in 2007 in the setting of non-STEMI in atrial fibrillation with RVR, no known recurrence since then. He has a history of  45 pack year h/o smoking tobacco, ascending aortic aneurysm (follows Dr.Bryan Bartle), small abdominal aortic aneurysm noted in 2015 (3cm), H/O colon cancer, in remission since 1992, GERD, hyperlipidemia, obstructive sleep apnea not using CPAP.  Patient to the office for follow-up after recent hospitalization.  Patient presented to [facility] emergency department on 03/25/2021 because while at home patient was feeling nauseous and short of breath and therefore he checked his pulse which was jumping from 50 to 150 bpm.  Work-up in the emergency room revealed patient to be in atrial flutter with 4-1 AV block, therefore he was started on Cardizem drip and oral metoprolol.  Echocardiogram at this time revealed LVEF 35-40% with global hypokinesis, therefore diltiazem was discontinued and patient's metoprolol was titrated to Toprol-XL 75 mg once daily.  At the time of hospitalization patient declined anticoagulation despite [place] Vasc score of 5.  During hospitalization patient's rhythm transition to atrial fibrillation with rapid ventricular response.  Patient reports since discharge from the hospital he has been feeling relatively well, however he does continue to have dyspnea on exertion over the last 1 week.  Patient denies chest pain, palpitations, syncope, near syncope, dizziness.  Denies orthopnea, PND, leg swelling.  Denies symptoms suggestive of TIA/CVA.  Patient also denies history of any significant bleeding episodes.  Notably patient  is also undergoing treatment by PCP for bronchitis.  Past Medical History:  Diagnosis Date  . Allergy   . Arthritis   . BPH (benign prostatic hyperplasia)   . CAD (coronary artery disease)    s/p CABG 2007; NSTEMI in setting of AFib with RVR 12/2009; cath 1/11: S-RCA ok with prox 30-40% and 50% stenoses; S-OM and RI  with patent OM limb but an occluded RI limb; S-D2 and L-LAD ok; EF 55%  . Cataract    removed both eyes   . Clotting disorder (HCC)    s/p CABG x 5- PE   . Colon cancer ([place]) 1992  . COPD (chronic obstructive pulmonary disease) ([place])   . Diverticulosis   . Essential hypertension   . Family history of breast cancer   . Family history of colon cancer   . Family history of lung cancer   . GERD (gastroesophageal reflux disease)   . History of pulmonary embolus (PE) 2007   Following CABG  . Hyperlipidemia   . 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 OSA on CPAP   . Paroxysmal atrial fibrillation (HCC)    Previously on Multaq, declines anticoagulation  . Personal history of colonic polyps 01/22/2000   Tubular adenoma  . RBBB (right bundle branch block)   . Sleep apnea    no cpap   . 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   in situ-left forearm    Past Surgical History:  Procedure Laterality Date  . COLECTOMY  1992   w/ colostomy  related to colon cancer  . COLON SURGERY  1993   to take down Colostomy  . COLONOSCOPY    . CORONARY ARTERY BYPASS GRAFT  2007  . CORONARY STENT INTERVENTION N/A 10/07/2018   Procedure: CORONARY STENT INTERVENTION;  Surgeon: Adrian Prows, MD;  Location: [facility];  Service: Cardiovascular;  Laterality: N/A;  . HAND [place]   tendon repair and nerve; left  . INGUINAL HERNIA REPAIR  2009  . LAPAROSCOPIC CHOLECYSTECTOMY  2004  . POLYPECTOMY    . RIGHT/LEFT HEART CATH AND CORONARY/GRAFT ANGIOGRAPHY N/A 07/03/2017    Procedure: Right/Left Heart Cath and Coronary/Graft Angiography;  Surgeon: Leonie Man, MD;  Location: [facility];  Service: Cardiovascular;  Laterality: N/A;  . RIGHT/LEFT HEART CATH AND CORONARY/GRAFT ANGIOGRAPHY N/A 10/07/2018   Procedure: RIGHT/LEFT HEART CATH AND CORONARY/GRAFT ANGIOGRAPHY;  Surgeon: Adrian Prows, MD;  Location: [place] [facility];  Service: Cardiovascular;  Laterality: N/A;  . UMBILICAL HERNIA REPAIR    . VENTRAL HERNIA REPAIR  2009   Family History  Problem Relation Age of Onset  . Lung cancer Mother   . Heart disease Mother   . Emphysema Father   . Heart disease Father   . Cancer Sister 90       unknown type  . Breast cancer Sister        dx. >50  . Colon cancer Sister        dx. >50  . Breast cancer Sister        dx. >50  . Colon cancer Sister        dx. >50  . Colon cancer Cousin        dx. early 78s  . Esophageal cancer Neg Hx   . Rectal cancer Neg Hx   . Stomach cancer Neg Hx   . Colon polyps Neg Hx    Social History   Tobacco Use  . Smoking status: Former Smoker    Packs/day: 1.00    Years: 45.00    Pack years: 45.00    Types: Cigarettes    Quit date: 12/18/1995    Years since quitting: 25.3  . Smokeless tobacco: Never Used  Substance Use Topics  . Alcohol use: No    Comment: quit in 1980   Marital Status: Married   Review of Systems  Constitutional: Negative for decreased appetite, malaise/fatigue, weight gain and weight loss.  Eyes: Negative for visual disturbance.  Cardiovascular: Positive for dyspnea on exertion. Negative for chest pain, claudication, leg swelling, near-syncope, orthopnea, palpitations, paroxysmal nocturnal dyspnea and syncope.  Respiratory: Negative for hemoptysis, shortness of breath and wheezing.   Endocrine: Negative for cold intolerance and heat intolerance.  Hematologic/Lymphatic: Does not bruise/bleed easily.  Skin: Negative for nail changes.  Musculoskeletal: Positive for back pain and joint pain  (knee). Negative for muscle weakness and myalgias.  Gastrointestinal: Negative for abdominal pain, change in bowel habit, melena, nausea and vomiting.  Neurological: Negative for difficulty with concentration, dizziness, focal weakness, headaches and weakness.  Psychiatric/Behavioral: Negative for altered mental status and suicidal ideas.  All other systems reviewed and are negative.  Objective:  Blood pressure 107/74, pulse 76, temperature 98.5 F (36.9 C), resp. rate 16, height 5\' 11"  (1.803 m), weight 238 lb (108 kg), SpO2 98 %. Body mass index is 33.19 kg/m.  Vitals with BMI 03/30/2021 03/26/2021 03/26/2021  Height 5\' 11"  - -  Weight 238 lbs - -  BMI 05.39 - -  Systolic  355 - -  Diastolic 74 - -  Pulse 76 94 99    Physical Exam Vitals reviewed.  Constitutional:      Appearance: He is well-developed.  HENT:     Head: Normocephalic and atraumatic.  Cardiovascular:     Rate and Rhythm: Tachycardia present. Rhythm irregularly irregular.     Pulses: Intact distal pulses.          Femoral pulses are 2+ on the right side and 2+ on the left side.      Popliteal pulses are 1+ on the right side and 1+ on the left side.       Dorsalis pedis pulses are 1+ on the right side and 1+ on the left side.       Posterior tibial pulses are 2+ on the right side and 2+ on the left side.     Heart sounds: Normal heart sounds, S1 normal and S2 normal. No murmur heard. No gallop.   Pulmonary:     Effort: Pulmonary effort is normal. No accessory muscle usage or respiratory distress.     Breath sounds: Normal breath sounds. No wheezing, rhonchi or rales.  Abdominal:     General: Bowel sounds are normal.     Palpations: Abdomen is soft.  Musculoskeletal:        General: Normal range of motion.     Cervical back: Normal range of motion.     Right lower leg: No edema.     Left lower leg: No edema.  Skin:    General: Skin is warm and dry.  Neurological:     Mental Status: He is alert and oriented to  person, place, and time.     Radiology    Chest x-ray 03/25/2021: 1. Low volume film with cardiomegaly and vascular congestion. 2. Patchy airspace disease in the left base compatible with atelectasis or pneumonia. 3. Tiny nodular density peripheral right mid lung is stable, suggesting benign etiology.  CTA of chest 09/16/2019:  4.4 cm ascending thoracic aortic aneurysm. Recommend annual imaging followup by CTA or MRA. This recommendation follows 2010 ACCF/AHA/AATS/ACR/ASA/SCA/SCAI/SIR/STS/SVM Guidelines for the Diagnosis and Management of Patients with Thoracic Aortic Disease. Circulation. 2010; 121: D322-G254. Aortic aneurysm NOS (ICD10-I71.9)  Old granulomatous disease. No acute cardiopulmonary disease. Aortic Atherosclerosis (ICD10-I70.0).  Laboratory examination:   CMP Latest Ref Rng & Units 03/26/2021 03/25/2021 10/08/2018  Glucose 70 - 99 mg/dL 93 125(H) 111(H)  BUN 8 - 23 mg/dL 20 19 10   Creatinine 0.61 - 1.24 mg/dL 1.08 1.20 1.21  Sodium 135 - 145 mmol/L 134(L) 137 139  Potassium 3.5 - 5.1 mmol/L 3.4(L) 4.0 3.8  Chloride 98 - 111 mmol/L 100 103 106  CO2 22 - 32 mmol/L 25 23 28   Calcium 8.9 - 10.3 mg/dL 8.5(L) 9.2 8.9  Total Protein 6.5 - 8.1 g/dL - 7.2 -  Total Bilirubin 0.3 - 1.2 mg/dL - 0.5 -  Alkaline Phos 38 - 126 U/L - 54 -  AST 15 - 41 U/L - 28 -  ALT 0 - 44 U/L - 34 -   CBC Latest Ref Rng & Units 03/26/2021 03/25/2021 10/08/2018  WBC 4.0 - 10.5 K/uL 9.2 9.8 6.5  Hemoglobin 13.0 - 17.0 g/dL 14.3 14.5 13.3  Hematocrit 39.0 - 52.0 % 43.9 44.3 43.0  Platelets 150 - 400 K/uL 221 235 174   Lipid Panel     Component Value Date/Time   CHOL 134 09/08/2015 1210   TRIG 211.0 (H) 09/08/2015 1210   HDL  36.10 (L) 09/08/2015 1210   CHOLHDL 4 09/08/2015 1210   VLDL 42.2 (H) 09/08/2015 1210   LDLCALC 59 07/23/2014 1638   LDLDIRECT 87.0 09/08/2015 1210   HEMOGLOBIN A1C No results found for: HGBA1C, MPG TSH Recent Labs    03/25/21 1443  TSH 0.630    External  Labs:  Cholesterol, total 128.000 m 06/22/2020 HDL 34.000 mg 06/22/2020 LDL-C 63.000 mg 06/22/2020 Triglycerides 183.000 m 06/22/2020  Hemoglobin 14.900 g/d 06/22/2020  Creatinine, Serum 1.250 mg/ 06/22/2020 Potassium 3.900 mm 06/22/2020 ALT (SGPT) 30.000 U/L 06/22/2020  TSH 2.120 06/22/2020   Medications and Allergies  No Known Allergies  Current Outpatient Medications on File Prior to Visit  Medication Sig Dispense Refill  . acetaminophen (TYLENOL) 325 MG tablet Take 2 tablets (650 mg total) by mouth every 6 (six) hours as needed for mild pain (or Fever >/= 101). 12 tablet 0  . albuterol (PROVENTIL HFA;VENTOLIN HFA) 108 (90 Base) MCG/ACT inhaler Inhale 2 puffs into the lungs every 4 (four) hours as needed for wheezing or shortness of breath. 1 Inhaler 5  . albuterol (PROVENTIL) (2.5 MG/3ML) 0.083% nebulizer solution USE 1 VIAL IN NEBULIZER EVERY 6 HOURS AS NEEDED FOR WHEEZING OR SHORTNESS OF BREATH 75 mL 3  . atorvastatin (LIPITOR) 20 MG tablet Take 20 mg by mouth at bedtime.   5  . clonazePAM (KLONOPIN) 1 MG tablet Take 1 mg by mouth as needed.    . doxycycline (VIBRA-TABS) 100 MG tablet Take 1 tablet (100 mg total) by mouth 2 (two) times daily. 14 tablet 0  . fluticasone (FLONASE) 50 MCG/ACT nasal spray Place 2 sprays into both nostrils daily. (Patient taking differently: Place 2 sprays into both nostrils as needed.) 16 g 2  . gabapentin (NEURONTIN) 100 MG capsule Take 100 mg by mouth 3 (three) times daily.    Marland Kitchen guaiFENesin (MUCINEX) 600 MG 12 hr tablet Take 600 mg by mouth 2 (two) times daily as needed.     Marland Kitchen HYDROcodone-acetaminophen (NORCO/VICODIN) 5-325 MG tablet Take 1 tablet by mouth every 6 (six) hours as needed for moderate pain.    . isosorbide mononitrate (IMDUR) 30 MG 24 hr tablet Take 1 tablet by mouth once daily 90 tablet 1  . ketoconazole (NIZORAL) 2 % cream Apply 1 application topically daily.    . Multiple Vitamin (MULTIVITAMIN WITH MINERALS) TABS tablet Take 1 tablet by mouth  daily.    . nitroGLYCERIN (NITROSTAT) 0.4 MG SL tablet Place 1 tablet (0.4 mg total) under the tongue every 5 (five) minutes as needed for chest pain. 25 tablet 3  . Omega-3 Fatty Acids (FISH OIL) 1200 MG CAPS Take 1,200 mg by mouth daily.    Marland Kitchen omeprazole (PRILOSEC) 20 MG capsule Take 1 capsule (20 mg total) by mouth daily. 30 capsule 3  . Potassium Chloride ER 20 MEQ TBCR Take 1 tablet by mouth daily.    Marland Kitchen STIOLTO RESPIMAT 2.5-2.5 MCG/ACT AERS INHALE 2 PUFFS BY MOUTH ONCE DAILY (Patient taking differently: Inhale 2 puffs into the lungs daily.) 4 g 5  . tamsulosin (FLOMAX) 0.4 MG CAPS capsule Take 0.8 mg by mouth every evening.     Marland Kitchen tiZANidine (ZANAFLEX) 4 MG tablet Take 4 mg by mouth at bedtime as needed for muscle spasms.    Marland Kitchen torsemide (DEMADEX) 20 MG tablet Take 40 mg by mouth daily.     No current facility-administered medications on file prior to visit.     Cardiac Studies:   CABG 2007: LIMA to LAD, SVG  to D2, SVG to OM1 and ramus intermediate with OM1 limb occluded, SVG to RCA with a proximal 50% stenosis by coronary angiography in July 2018. Normal LVEF.  Lexiscan sestamibi stress test 12/03/2016: No significant ST-T wave changes during stress test. Medium defect of moderate intensity in the basal inferior, mid inferoseptal, mid inferior and mid inferolateral and apical inferior and apical lateral location consistent with Myocardial infarction mild degree of peri-infarct ischemia. LVEF 52%. Intermediate risk study.  Echocardiogram 09/26/2018: Left ventricle cavity is normal in size. Moderate concentric hypertrophy of the left ventricle. Abnormal septal wall motion due to post-operative coronary artery bypass graft. Doppler evidence of grade I (impaired) diastolic dysfunction, normal LAP. Calculated EF 56%. Left atrial cavity is normal in size. The interatrial Septum is thin and mobile but appears to be intact by 2D and CF Doppler interrogation. The aortic root is mildly dilated at 4.11  cm. Compared to Echocardiogram 03/19/2017: Normal LV systolic function, severe LVH, EF 32-12%, grade 1 diastolic dysfunction. Right ventricle mildly dilated.  Coronary angiogram 10/08/2018 and had a very high-grade stenosis of a large hiatal D1/ramus branch S/P 3.0 x 23 mm Sierra Xience DES. Patent LIMA to LAD, SVG to D2 and SVG to RCA widely patent. Normal right heart catheterization, preserved cardiac output and cardiac index.  Echocardiogram 03/26/2021: 1. Left ventricular ejection fraction, by estimation, is 35 to 40%. The  left ventricle has moderately decreased function. The left ventricle  demonstrates global hypokinesis. There is mild left ventricular  hypertrophy. Left ventricular diastolic  parameters are indeterminate.  2. Right ventricular systolic function is normal. The right ventricular  size is normal.  3. Left atrial size was mildly dilated.  4. The mitral valve is normal in structure. No evidence of mitral valve  regurgitation. No evidence of mitral stenosis.  5. The aortic valve is tricuspid. There is mild calcification of the  aortic valve. Aortic valve regurgitation is trivial. Mild aortic valve  sclerosis is present, with no evidence of aortic valve stenosis.  6. The inferior vena cava is normal in size with greater than 50%  respiratory variability, suggesting right atrial pressure of 3 mmHg.   EKG:   03/30/2021: Atrial fibrillation with rapid ventricular response at a rate of 119 bpm.  Normal axis.  Right bundle branch block with secondary ST-T wave changes, unchanged compared to 10/12/2020.  ED EKG 03/25/2021: Atrial flutter with 4-1 AV block at a rate of 128 bpm.  EKG 10/12/2020: Sinus rhythm with  first-degree AV block at rate of 82 bpm, normal axis, right bundle branch block.   Assessment:     ICD-10-CM   1. Persistent atrial fibrillation (HCC)  I48.19 EKG 12-Lead  2. Coronary artery disease involving coronary bypass graft of native heart without angina  pectoris  I25.810   3. OSA (obstructive sleep apnea)  G47.33   4. Thoracic aortic aneurysm without rupture (HCC)  I71.2     This patients CHA2DS2-VASc Score 5 (CHF, HTN, vasc, age) and yearly risk of stroke 7.2%.   Recommendations:   [name]  is a 80 y.o. with known coronary artery disease and CABG in 2007 in the setting of non-STEMI in atrial fibrillation with RVR, no known recurrence since then. He has a history of  45 pack year h/o smoking tobacco, ascending aortic aneurysm (follows Dr.Bryan Bartle), small abdominal aortic aneurysm noted in 2015 (3cm), H/O colon cancer, in remission since 1992, GERD, hyperlipidemia, obstructive sleep apnea not using CPAP.  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  Reviewed and discussed at length with patient, and his wife who is present at bedside, regarding echocardiogram, pathophysiology of atrial fibrillation and heart failure.  Patient has multiple risk factors for atrial fibrillation including CAD status post CABG, obesity, sleep apnea for which she does not use his CPAP.  Encourage patient to follow-up with PCP who manages his sleep apnea and find a way to start using his CPAP on a nightly basis.  Also reviewed and discussed importance of diet and lifestyle modifications, including losing weight.  In regard to atrial fibrillation, circular rate remains elevated.  We will therefore discontinue metoprolol succinate and switch patient to metoprolol tartrate 50 mg twice daily.  Patient will continue to monitor heart rate and blood pressure and notify our office of any issues with the medication change.  Recommend initiation of anticoagulation in view of CHA2DS2-VASc score of 5 and no history of bleeding complications as contraindications.  Discussed at length with patient and his wife  regarding risks versus benefits of initiating oral anticoagulation.  Patient and his wife both verbalized understanding and agreed to proceed with initiation of anticoagulation.  Patient understands bleeding risk, and agrees to seek evaluation in the case of any injury or trauma.  Will start Eliquis 5 mg twice daily.  We will proceed with anticoagulation and rate control strategy, patient will then present to our office for repeat EKG as he has been on anticoagulation for 4 weeks.  If patient remains in atrial fibrillation we will consider cardioversion at that time.  In view of patient's reduced LVEF in the setting of atrial fibrillation/flutter will likely pursue rhythm management strategy as well.  We will also consider repeat ischemic evaluation when patient is more stable from atrial fibrillation standpoint.  Blood pressure is soft, will therefore continue to monitor closely.  Follow-up in 4 weeks, sooner if needed, for atrial fibrillation with repeat EKG.   Alethia Berthold, PA-C 03/31/2021, 11:29 AM Office: [phone]

## 2021-03-29 NOTE — ED Provider Notes (Signed)
[facility] Provider Note   CSN: [id number] Arrival date & time: 03/25/21  1222     History Chief Complaint  Patient presents with  . Irregular Heart Beat    [name] is a 80 y.o. male.  Patient complains of heart fluttering.  No chest pain  The history is provided by the patient and medical records. No language interpreter was used.  Palpitations Palpitations quality:  Irregular Onset quality:  Sudden Timing:  Constant Progression:  Worsening Chronicity:  New Context: not anxiety   Relieved by:  Nothing Worsened by:  Nothing Associated symptoms: no back pain, no chest pain and no cough        Past Medical History:  Diagnosis Date  . Allergy   . Arthritis   . BPH (benign prostatic hyperplasia)   . CAD (coronary artery disease)    s/p CABG 2007; NSTEMI in setting of AFib with RVR 12/2009; cath 1/11: S-RCA ok with prox 30-40% and 50% stenoses; S-OM and RI  with patent OM limb but an occluded RI limb; S-D2 and L-LAD ok; EF 55%  . Cataract    removed both eyes   . Clotting disorder (HCC)    s/p CABG x 5- PE   . Colon cancer ([place]) 1992  . COPD (chronic obstructive pulmonary disease) ([place])   . Diverticulosis   . Essential hypertension   . Family history of breast cancer   . Family history of colon cancer   . Family history of lung cancer   . GERD (gastroesophageal reflux disease)   . History of pulmonary embolus (PE) 2007   Following CABG  . Hyperlipidemia   . 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 OSA on CPAP   . Paroxysmal atrial fibrillation (HCC)    Previously on Multaq, declines anticoagulation  . Personal history of colonic polyps 01/22/2000   Tubular adenoma  . RBBB (right bundle branch block)   . Sleep apnea    no cpap   . 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   in situ-left forearm    Patient Active Problem List   Diagnosis Date Noted   . Acute on chronic combined systolic and diastolic CHF (congestive heart failure) --EF 35 to 40% 03/26/2021  . New onset a-fib ([place]) 03/25/2021  . Paroxysmal A-fib with RVR 03/25/2021  . History of  deep vein thrombosis (DVT)/H/o Provoked DVT in 2007 --after CABG,  03/25/2021  . Pneumonia due to COVID-19 virus 10/04/2020  . Genetic testing 02/17/2020  . 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 Family history of colon cancer   . Family history of breast cancer   . Family history of lung cancer   . Allergic rhinitis 07/21/2019  . Nasal obstruction 01/22/2019  . Post PTCA 10/07/2018  . Enrolled in clinical trial of drug 10/07/2018  . COPD (chronic obstructive pulmonary disease) ([place]) 08/08/2018  . CAD-  S/P stenting of high OM1/ramus intermediate with 3.0 x 23 mm Xience [place] DES/S/p CABG 07/03/2017  . Abnormal nuclear stress test 07/03/2017  . Weakness 06/13/2017  . Bad posture 09/08/2014  . Leg weakness, bilateral 09/08/2014  . Anemia 03/07/2012  . Other general symptoms(780.99) 03/07/2012  . GERD (gastroesophageal reflux disease) 03/07/2012  . Personal history of malignant neoplasm of rectum, rectosigmoid junction, and anus 03/07/2012  . Hx of CABG 03/07/2012  . Chronic diastolic heart failure ([place]) 12/06/2011  .  Fatigue 04/03/2011  . Aortic aneurysm ([place]) 04/03/2011  . SYNCOPE 02/21/2011  . PALPITATIONS 02/21/2011  . BRADYCARDIA 01/22/2011  . ATRIAL FIBRILLATION 01/23/2010  . Obstructive sleep apnea 01/16/2010  . Dyspnea on exertion 05/12/2009  . HYPERCHOLESTEROLEMIA 03/22/2009  . HYPERLIPIDEMIA 03/22/2009  . OBESITY 03/22/2009  . Essential hypertension 03/22/2009  . Coronary atherosclerosis 03/22/2009  . BUNDLE BRANCH BLOCK, RIGHT 03/22/2009  . GERD 03/22/2009  . BPH (benign prostatic hyperplasia) 03/22/2009  . Personal history of colonic polyps 01/22/2000    Past Surgical History:  Procedure Laterality Date  . COLECTOMY  1992   w/ colostomy  related to  colon cancer  . COLON SURGERY  1993   to take down Colostomy  . COLONOSCOPY    . CORONARY ARTERY BYPASS GRAFT  2007  . CORONARY STENT INTERVENTION N/A 10/07/2018   Procedure: CORONARY STENT INTERVENTION;  Surgeon: Adrian Prows, MD;  Location: [facility];  Service: Cardiovascular;  Laterality: N/A;  . HAND [place]   tendon repair and nerve; left  . INGUINAL HERNIA REPAIR  2009  . LAPAROSCOPIC CHOLECYSTECTOMY  2004  . POLYPECTOMY    . RIGHT/LEFT HEART CATH AND CORONARY/GRAFT ANGIOGRAPHY N/A 07/03/2017   Procedure: Right/Left Heart Cath and Coronary/Graft Angiography;  Surgeon: Leonie Man, MD;  Location: [facility];  Service: Cardiovascular;  Laterality: N/A;  . RIGHT/LEFT HEART CATH AND CORONARY/GRAFT ANGIOGRAPHY N/A 10/07/2018   Procedure: RIGHT/LEFT HEART CATH AND CORONARY/GRAFT ANGIOGRAPHY;  Surgeon: Adrian Prows, MD;  Location: [facility];  Service: Cardiovascular;  Laterality: N/A;  . UMBILICAL HERNIA REPAIR    . VENTRAL HERNIA REPAIR  2009       Family History  Problem Relation Age of Onset  . Lung cancer Mother   . Heart disease Mother   . Emphysema Father   . Heart disease Father   . Cancer Sister 5       unknown type  . Breast cancer Sister        dx. >50  . Colon cancer Sister        dx. >50  . Breast cancer Sister        dx. >50  . Colon cancer Sister        dx. >50  . Colon cancer Cousin        dx. early 82s  . Esophageal cancer Neg Hx   . Rectal cancer Neg Hx   . Stomach cancer Neg Hx   . Colon polyps Neg Hx     Social History   Tobacco Use  . Smoking status: Former Smoker    Packs/day: 1.00    Years: 45.00    Pack years: 45.00    Types: Cigarettes    Quit date: 12/18/1995    Years since quitting: 25.2  . Smokeless tobacco: Never Used  Vaping Use  . Vaping Use: Never used  Substance Use Topics  . Alcohol use: No    Comment: quit in 1980  . Drug use: No    Home Medications Prior to Admission  medications   Medication Sig Start Date End Date Taking? Authorizing Provider  albuterol (PROVENTIL HFA;VENTOLIN HFA) 108 (90 Base) MCG/ACT inhaler Inhale 2 puffs into the lungs every 4 (four) hours as needed for wheezing or shortness of breath. 08/08/18  Yes Byrum, Rose Fillers, MD  albuterol (PROVENTIL) (2.5 MG/3ML) 0.083% nebulizer solution USE 1 VIAL IN NEBULIZER EVERY 6 HOURS AS NEEDED FOR WHEEZING OR SHORTNESS OF BREATH 12/12/20  Yes Collene Gobble, MD  atorvastatin (LIPITOR) 20 MG tablet Take 20 mg by mouth at bedtime.  03/31/18  Yes [provider]  clonazePAM (KLONOPIN) 1 MG tablet Take 1 mg by mouth as needed. 08/20/20  Yes [provider]  doxycycline (VIBRA-TABS) 100 MG tablet Take 1 tablet (100 mg total) by mouth 2 (two) times daily. 03/22/21  Yes Collene Gobble, MD  gabapentin (NEURONTIN) 100 MG capsule Take 100 mg by mouth 3 (three) times daily.   Yes [provider]  guaiFENesin (MUCINEX) 600 MG 12 hr tablet Take 600 mg by mouth 2 (two) times daily as needed.    Yes [provider]  HYDROcodone-acetaminophen (NORCO/VICODIN) 5-325 MG tablet Take 1 tablet by mouth every 6 (six) hours as needed for moderate pain.   Yes [provider]  isosorbide mononitrate (IMDUR) 30 MG 24 hr tablet Take 1 tablet by mouth once daily 04/14/19  Yes Adrian Prows, MD  ketoconazole (NIZORAL) 2 % cream Apply 1 application topically daily.   Yes [provider]  metoprolol succinate (TOPROL XL) 25 MG 24 hr tablet Take 1 tablet (25 mg total) by mouth See admin instructions. Take 1 tablet along with Toprol-XL 50 mg twice daily to make it 75 mg twice daily 03/26/21 03/26/22 Yes Emokpae, Courage, MD  Multiple Vitamin (MULTIVITAMIN WITH MINERALS) TABS tablet Take 1 tablet by mouth daily.   Yes [provider]  nitroGLYCERIN (NITROSTAT) 0.4 MG SL tablet Place 1 tablet (0.4 mg total) under the tongue every 5 (five) minutes as needed for chest pain. 11/28/16  Yes Imogene Burn, PA-C  Omega-3 Fatty Acids (FISH OIL) 1200 MG CAPS Take 1,200 mg by mouth daily.   Yes [provider]  Potassium Chloride ER 20 MEQ TBCR Take 1 tablet by mouth daily. 01/12/21  Yes [provider]  Bee 2.5-2.5 MCG/ACT AERS INHALE 2 PUFFS BY MOUTH ONCE DAILY Patient taking differently: Inhale 2 puffs into the lungs daily. 03/09/21  Yes Collene Gobble, MD  tamsulosin (FLOMAX) 0.4 MG CAPS capsule Take 0.8 mg by mouth every evening.    Yes [provider]  tiZANidine (ZANAFLEX) 4 MG tablet Take 4 mg by mouth at bedtime as needed for muscle spasms.   Yes [provider]  torsemide (DEMADEX) 20 MG tablet Take 40 mg by mouth daily. 01/26/21  Yes [provider]  acetaminophen (TYLENOL) 325 MG tablet Take 2 tablets (650 mg total) by mouth every 6 (six) hours as needed for mild pain (or Fever >/= 101). 03/26/21   Roxan Hockey, MD  aspirin EC 81 MG tablet Take 1 tablet (81 mg total) by mouth daily with breakfast. 03/26/21   Emokpae, Courage, MD  fluticasone (FLONASE) 50 MCG/ACT nasal spray Place 2 sprays into both nostrils daily. Patient taking differently: Place 2 sprays into both nostrils as needed. 01/22/19   Juanito Doom, MD  metoprolol succinate (TOPROL-XL) 50 MG 24 hr tablet Take 1 tablet (50 mg total) by mouth See admin instructions. Take 1 tablet along with Toprol-XL 25 mg twice daily to make it 75 mg twice daily 03/26/21   Roxan Hockey, MD  omeprazole (PRILOSEC) 20 MG capsule Take 1 capsule (20 mg total) by mouth daily. 03/26/21   Roxan Hockey, MD    Allergies    Patient has no known allergies.  Review of Systems   Review of Systems  Constitutional: Negative for appetite change and fatigue.  HENT: Negative for congestion, ear discharge and  sinus pressure.   Eyes: Negative for discharge.  Respiratory: Negative for cough.   Cardiovascular: Positive for palpitations. Negative for chest pain.  Gastrointestinal:  Negative for abdominal pain and diarrhea.  Genitourinary: Negative for frequency and hematuria.  Musculoskeletal: Negative for back pain.  Skin: Negative for rash.  Neurological: Negative for seizures and headaches.  Psychiatric/Behavioral: Negative for hallucinations.    Physical Exam Updated Vital Signs BP 132/73   Pulse 94   Temp (!) 97.3 F (36.3 C) (Oral)   Resp 20   Ht $R'5\' 11"'Au$  (1.803 m)   Wt 106.4 kg   SpO2 95%   BMI 32.72 kg/m   Physical Exam Vitals and nursing note reviewed.  Constitutional:      Appearance: He is well-developed.  HENT:     Head: Normocephalic.     Nose: Nose normal.  Eyes:     General: No scleral icterus.    Conjunctiva/sclera: Conjunctivae normal.  Neck:     Thyroid: No thyromegaly.  Cardiovascular:     Rate and Rhythm: Tachycardia present. Rhythm irregular.     Heart sounds: No murmur heard. No friction rub. No gallop.   Pulmonary:     Breath sounds: No stridor. No wheezing or rales.  Chest:     Chest wall: No tenderness.  Abdominal:     General: There is no distension.     Tenderness: There is no abdominal tenderness. There is no rebound.  Musculoskeletal:        General: Normal range of motion.     Cervical back: Neck supple.  Lymphadenopathy:     Cervical: No cervical adenopathy.  Skin:    Findings: No erythema or rash.  Neurological:     Mental Status: He is alert and oriented to person, place, and time.     Motor: No abnormal muscle tone.     Coordination: Coordination normal.  Psychiatric:        Behavior: Behavior normal.     ED Results / Procedures / Treatments   Labs (all labs ordered are listed, but only abnormal results are displayed) Labs Reviewed  COMPREHENSIVE METABOLIC PANEL - Abnormal; Notable for the following components:      Result Value   Glucose, Bld 125 (*)    All other components within normal limits  CBC WITH DIFFERENTIAL/PLATELET - Abnormal; Notable for the following components:   Neutro Abs 8.1  (*)    All other components within normal limits  BRAIN NATRIURETIC PEPTIDE - Abnormal; Notable for the following components:   B Natriuretic Peptide 189.0 (*)    All other components within normal limits  BASIC METABOLIC PANEL - Abnormal; Notable for the following components:   Sodium 134 (*)    Potassium 3.4 (*)    Calcium 8.5 (*)    All other components within normal limits  RESP PANEL BY RT-PCR (FLU A&B, COVID) ARPGX2  MRSA PCR SCREENING  TSH  MAGNESIUM  PROCALCITONIN  CBC  CBC WITH DIFFERENTIAL/PLATELET  TROPONIN I (HIGH SENSITIVITY)  TROPONIN I (HIGH SENSITIVITY)    EKG EKG Interpretation  Date/Time:  Saturday March 25 2021 14:58:45 EDT Ventricular Rate:  74 PR Interval:    QRS Duration: 150 QT Interval:  379 QTC Calculation: 421 R Axis:   39 Text Interpretation: Atrial flutter with predominant 4:1 AV block Right bundle branch block Confirmed by Malvin Johns ([id number]) on 03/26/2021 2:24:27 PM   Radiology No results found.  Procedures Procedures   Medications Ordered in ED Medications  diltiazem (CARDIZEM)  injection 10 mg (10 mg Intravenous Given 03/25/21 1311)  sodium chloride 0.9 % bolus 500 mL (0 mLs Intravenous Stopped 03/26/21 0519)  potassium chloride SA (KLOR-CON) CR tablet 40 mEq (40 mEq Oral Given 03/26/21 0838)  metoprolol succinate (TOPROL-XL) 24 hr tablet 25 mg (25 mg Oral Given 03/26/21 1210)    ED Course  I have reviewed the triage vital signs and the nursing notes.  Pertinent labs & imaging results that were available during my care of the patient were reviewed by me and considered in my medical decision making (see chart for details). CRITICAL CARE Performed by: Milton Ferguson Total critical care time:40 minutes Critical care time was exclusive of separately billable procedures and treating other patients. Critical care was necessary to treat or prevent imminent or life-threatening deterioration. Critical care was time spent personally by me on  the following activities: development of treatment plan with patient and/or surrogate as well as nursing, discussions with consultants, evaluation of patient's response to treatment, examination of patient, obtaining history from patient or surrogate, ordering and performing treatments and interventions, ordering and review of laboratory studies, ordering and review of radiographic studies, pulse oximetry and re-evaluation of patient's condition.    MDM Rules/Calculators/A&P                          Patient with rapid A. fib.  He is started on Cardizem and will be admitted Final Clinical Impression(s) / ED Diagnoses Final diagnoses:  None    Rx / DC Orders ED Discharge Orders         Ordered    aspirin EC 81 MG tablet  Daily with breakfast        03/26/21 1238    acetaminophen (TYLENOL) 325 MG tablet  Every 6 hours PRN        03/26/21 1238    metoprolol succinate (TOPROL-XL) 50 MG 24 hr tablet  See admin instructions        03/26/21 1238    omeprazole (PRILOSEC) 20 MG capsule  Daily        03/26/21 1238    metoprolol succinate (TOPROL XL) 25 MG 24 hr tablet  See admin instructions        03/26/21 1238    Increase activity slowly        03/26/21 1238    Diet - low sodium heart healthy        03/26/21 1238    Discharge instructions       Comments: 1)Very low-salt diet advised 2)Weigh yourself daily, call if you gain more than 3 pounds in 1 day or more than 5 pounds in 1 week as your diuretic medications may need to be adjusted 3)Limit your Fluid  intake to no more than 60 ounces (1.8 Liters) per day 4) please increase Toprol-XL/metoprolol XL to 75 mg twice daily ---take 1 tablet of Toprol-XL 50 mg along with Toprol-XL 25 mg twice daily to make it 75 mg twice daily 5) please follow-up with your cardiologist Dr. Einar Gip within a week for reevaluation and recheck--- also discuss possible anticoagulation with Xarelto or apixaban with Dr. Einar Gip 6) repeat BMP and CBC blood work within a week  advised   03/26/21 1238    Call MD for:  temperature >100.4        03/26/21 1238    Call MD for:  persistant nausea and vomiting        03/26/21 1238    Call MD  for:  difficulty breathing, headache or visual disturbances        03/26/21 1238    Call MD for:  persistant dizziness or light-headedness        03/26/21 1238           Milton Ferguson, MD 03/29/21 1740

## 2021-03-30 ENCOUNTER — Other Ambulatory Visit: Payer: Self-pay

## 2021-03-30 ENCOUNTER — Ambulatory Visit: Payer: Medicare PPO | Admitting: Student

## 2021-03-30 ENCOUNTER — Encounter: Payer: Self-pay | Admitting: Student

## 2021-03-30 VITALS — BP 107/74 | HR 76 | Temp 98.5°F | Resp 16 | Ht 71.0 in | Wt 238.0 lb

## 2021-03-30 DIAGNOSIS — I712 Thoracic aortic aneurysm, without rupture, unspecified: Secondary | ICD-10-CM

## 2021-03-30 DIAGNOSIS — G4733 Obstructive sleep apnea (adult) (pediatric): Secondary | ICD-10-CM | POA: Diagnosis not present

## 2021-03-30 DIAGNOSIS — I25810 Atherosclerosis of coronary artery bypass graft(s) without angina pectoris: Secondary | ICD-10-CM | POA: Diagnosis not present

## 2021-03-30 DIAGNOSIS — I4891 Unspecified atrial fibrillation: Secondary | ICD-10-CM

## 2021-03-30 DIAGNOSIS — I4819 Other persistent atrial fibrillation: Secondary | ICD-10-CM

## 2021-03-30 DIAGNOSIS — I2510 Atherosclerotic heart disease of native coronary artery without angina pectoris: Secondary | ICD-10-CM | POA: Diagnosis not present

## 2021-03-30 MED ORDER — APIXABAN 5 MG PO TABS: 5.0000 mg | ORAL_TABLET | Freq: Two times a day (BID) | ORAL | 3 refills | Status: DC

## 2021-03-30 MED ORDER — METOPROLOL TARTRATE 50 MG PO TABS: 50.0000 mg | ORAL_TABLET | Freq: Two times a day (BID) | ORAL | 3 refills | Status: DC

## 2021-04-12 ENCOUNTER — Ambulatory Visit: Payer: Medicare PPO | Admitting: Physician Assistant

## 2021-04-12 ENCOUNTER — Encounter: Payer: Self-pay | Admitting: Physician Assistant

## 2021-04-12 ENCOUNTER — Other Ambulatory Visit: Payer: Self-pay

## 2021-04-12 DIAGNOSIS — L570 Actinic keratosis: Secondary | ICD-10-CM

## 2021-04-12 DIAGNOSIS — Z1283 Encounter for screening for malignant neoplasm of skin: Secondary | ICD-10-CM

## 2021-04-12 DIAGNOSIS — Z85828 Personal history of other malignant neoplasm of skin: Secondary | ICD-10-CM | POA: Diagnosis not present

## 2021-04-12 DIAGNOSIS — Z8589 Personal history of malignant neoplasm of other organs and systems: Secondary | ICD-10-CM

## 2021-04-12 DIAGNOSIS — R238 Other skin changes: Secondary | ICD-10-CM | POA: Diagnosis not present

## 2021-04-19 ENCOUNTER — Other Ambulatory Visit: Payer: Self-pay

## 2021-04-19 ENCOUNTER — Ambulatory Visit (INDEPENDENT_AMBULATORY_CARE_PROVIDER_SITE_OTHER): Payer: Medicare PPO | Admitting: Emergency Medicine

## 2021-04-19 ENCOUNTER — Encounter: Payer: Self-pay | Admitting: Emergency Medicine

## 2021-04-19 DIAGNOSIS — I480 Paroxysmal atrial fibrillation: Secondary | ICD-10-CM

## 2021-04-19 DIAGNOSIS — J449 Chronic obstructive pulmonary disease, unspecified: Secondary | ICD-10-CM

## 2021-04-19 DIAGNOSIS — G4733 Obstructive sleep apnea (adult) (pediatric): Secondary | ICD-10-CM | POA: Diagnosis not present

## 2021-04-19 NOTE — Assessment & Plan Note (Signed)
Planning to follow with Cardiology, possible DCCV in 30 days

## 2021-04-19 NOTE — Assessment & Plan Note (Signed)
Please continue Stiolto 2 puffs once daily. Use your albuterol either 2 puffs or 1 nebulizer treatment up to every 4 hours if needed for shortness of breath, chest tightness, wheezing. Follow with Dr Lamonte Sakai in 3 months or sooner if you have any problems.

## 2021-04-19 NOTE — Patient Instructions (Addendum)
Please continue Stiolto 2 puffs once daily. Use your albuterol either 2 puffs or 1 nebulizer treatment up to every 4 hours if needed for shortness of breath, chest tightness, wheezing. We will send a DME order to [facility] to either maintain your existing CPAP and provide supplies or to get you a new CPAP machine through their company.  I am glad that you are interested in getting back on your CPAP machine Follow with cardiology as planned Follow with Dr Lamonte Sakai in 3 months or sooner if you have any problems.

## 2021-04-19 NOTE — Assessment & Plan Note (Signed)
We will send a DME order to [facility] to either maintain your existing CPAP and provide supplies or to get you a new CPAP machine through their company.  I am glad that you are interested in getting back on your CPAP machine

## 2021-04-19 NOTE — Addendum Note (Signed)
Addended by: Gavin Potters R on: 04/19/2021 05:20 PM   Modules accepted: Orders

## 2021-04-19 NOTE — Progress Notes (Signed)
Subjective:    Patient ID: [name], male    DOB: January 04, 1941, 80 y.o.   MRN: [id number]  Shortness of Breath Pertinent negatives include no ear pain, fever, headaches, leg swelling, rash, rhinorrhea, sore throat, vomiting or wheezing.    ROV 07/21/2019 --80 year old man with mild COPD, obstructive sleep apnea.  He also has coronary disease, A. fib.  I continued to Stiolto at last visit.  He reports that he has been taking it prn - helps his breathing and secretion clearance.  He stopped using his CPAP machine - has been dealing with a lot of dry mouth, uses nasal mask w a chin strap.  He has stopped loratadine.  Last visit we added fluticasone nasal spray, using prn.   ROV 10/04/2020 --follow-up visit for 80 year old gentleman with mild COPD and obstructive sleep apnea, coronary artery disease, A. fib.  He also has allergic rhinitis.  He unfortunately had COVID-19 pneumonia, dx 09/07/20. He received the monoclonal Ab. He does feel better - still having some wheeze, some residual SOB. Minimal cough, gets a bit of mucous in his throat. He was treated for pharyngitis w doxycycline by Dr Kenton Kingfisher last Friday. He ran out of Stiolto last week. He has albuterol nebs, needs an HFA. He has used nebs about once a day since COVID, formerly used very little. He is no longer using CPAP.   ROV 04/19/21 --80 year old man with mild COPD, OSA, allergic rhinitis.  He had COVID-19 pneumonia in 08/2020.  Other past medical history significant for atrial fibrillation, CAD. Has been managed on Stiolto.  Has albuterol and uses both HFA and nebs. HFA 2x a day, nebs about 3x a week.  I treated him with prednisone and doxycycline 03/20/2021 when he called concerned about increased dyspnea, wheeze, productive cough of clear sputum.  He started these but then was admitted 03/25/2021 with atrial fibrillation.  His metoprolol was increased and he will follow with Dr. Einar Gip.  He is interested in getting back on a CPAP   Review of  Systems  Constitutional: Negative for fatigue, fever and unexpected weight change.  HENT: Negative for congestion, dental problem, ear pain, nosebleeds, postnasal drip, rhinorrhea, sinus pressure, sneezing, sore throat and trouble swallowing.   Eyes: Negative for redness and itching.  Respiratory: Negative for cough, chest tightness, shortness of breath and wheezing.   Cardiovascular: Negative for palpitations and leg swelling.  Gastrointestinal: Negative for nausea and vomiting.  Genitourinary: Negative for dysuria.  Musculoskeletal: Negative for joint swelling and myalgias.  Skin: Negative for rash.  Neurological: Negative for headaches.  Hematological: Does not bruise/bleed easily.  Psychiatric/Behavioral: Negative for dysphoric mood. The patient is not nervous/anxious.        Objective:   Physical Exam Vitals:   04/19/21 1629  BP: 112/72  Pulse: 73  Temp: 98.1 F (36.7 C)  TempSrc: Temporal  Weight: 244 lb 12.8 oz (111 kg)  Height: 5\' 11"  (1.803 m)   Gen: Pleasant, obese, in no distress,  normal affect  ENT: No lesions,  mouth clear,  oropharynx clear, no postnasal drip  Neck: No JVD, no stridor  Lungs: No use of accessory muscles, no wheeze, decreased at bases  Cardiovascular:  irreg irreg, no M. Trace ankle edema.   Musculoskeletal: amputation R index finger, no cyanosis or clubbing  Neuro: alert, non focal  Skin: Warm, no lesions or rashes     Assessment & Plan:  COPD (chronic obstructive pulmonary disease) (HCC) Please continue Stiolto 2 puffs once daily. Use  your albuterol either 2 puffs or 1 nebulizer treatment up to every 4 hours if needed for shortness of breath, chest tightness, wheezing. Follow with Dr Lamonte Sakai in 3 months or sooner if you have any problems.  Paroxysmal A-fib with RVR Planning to follow with Cardiology, possible DCCV in 30 days  Obstructive sleep apnea We will send a DME order to [facility] to either maintain your existing  CPAP and provide supplies or to get you a new CPAP machine through their company.  I am glad that you are interested in getting back on your CPAP machine  Baltazar Apo, MD, PhD 04/19/2021, 4:56 PM [facility] [phone] or if no answer [phone]

## 2021-05-01 ENCOUNTER — Telehealth: Payer: Self-pay | Admitting: Emergency Medicine

## 2021-05-01 MED ORDER — DOXYCYCLINE HYCLATE 100 MG PO TABS: 100.0000 mg | ORAL_TABLET | Freq: Two times a day (BID) | ORAL | 0 refills | Status: DC

## 2021-05-01 NOTE — Telephone Encounter (Signed)
Spoke with wife Collie Siad per DPR who states that patient is having symptoms of productive cough but states that he can't get up sputum feels like it is stuck in his throat and nasal congestion. Symptoms started Thursday. Denies fevers or shortness of breath.  Has not had Covid 19 test withing past 5 days. Patient had covid in September and got the infusion and is vaccinated Pharmacy is [facility] [place].   Dr. Lamonte Sakai please advise

## 2021-05-01 NOTE — Telephone Encounter (Signed)
Called patient twice and received the busy tone each time. Will call back later.

## 2021-05-01 NOTE — Telephone Encounter (Signed)
Spoke with pt and spouse, aware of recs. rx sent to preferred pharmacy. Nothing further needed at this time- will close encounter.

## 2021-05-01 NOTE — Telephone Encounter (Signed)
Make sure he is using his flonase and mucinex  Have him take doxycycline 100mg  bid x 7 days  Ok to use OTC decongestant like tylenol cold & flu temporarily for symptoms.

## 2021-05-01 NOTE — Progress Notes (Signed)
Primary Physician:  Shirline Frees, MD   Patient ID: [name], male    DOB: 02-Nov-1941, 80 y.o.   MRN: [id number]  Subjective:    Chief Complaint  Patient presents with  . Follow-up  . Coronary Artery Disease    HPI: [name]  is a 80 y.o. male  with known coronary artery disease and CABG in 2007 in the setting of non-STEMI in atrial fibrillation with RVR, no known recurrence since then. He has a history of  45 pack year h/o smoking tobacco, ascending aortic aneurysm (follows Dr.Bryan Bartle), small abdominal aortic aneurysm noted in 2015 (3cm), H/O colon cancer, in remission since 1992, GERD, hyperlipidemia, obstructive sleep apnea not using CPAP.   Patient hospitalized 03/2021 with recurrence of atrial flutter and echocardiogram revealed new onset LVEF 35-40% with global hypokinesis. At last visit switch patient to metoprolol tartrate 50 mg BID, started Eliquis 5 mg BID.   Patient now presents for 4 week follow up, he has now been on anticoagulation for 4 weeks.  Notably patient is also undergoing treatment for bronchitis by PCP with doxycycline. He continues to complain of fatigue, palpitations, and dyspnea on exertion.  Denies chest pain, syncope, near syncope, dizziness, orthopnea, PND, leg swelling.  Denies symptoms suggestive of TIA/CVA.  He has not followed up regarding CPAP titration.  He is tolerating anticoagulation without bleeding diathesis.  Past Medical History:  Diagnosis Date  . Allergy   . Arthritis   . BPH (benign prostatic hyperplasia)   . CAD (coronary artery disease)    s/p CABG 2007; NSTEMI in setting of AFib with RVR 12/2009; cath 1/11: S-RCA ok with prox 30-40% and 50% stenoses; S-OM and RI  with patent OM limb but an occluded RI limb; S-D2 and L-LAD ok; EF 55%  . Cataract    removed both eyes   . Clotting disorder (HCC)    s/p CABG x 5- PE   . Colon cancer ([place]) 1992  . COPD (chronic obstructive pulmonary disease) ([place])   . Diverticulosis   .  Essential hypertension   . Family history of breast cancer   . Family history of colon cancer   . Family history of lung cancer   . GERD (gastroesophageal reflux disease)   . History of pulmonary embolus (PE) 2007   Following CABG  . Hyperlipidemia   . 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 OSA on CPAP   . Paroxysmal atrial fibrillation (HCC)    Previously on Multaq, declines anticoagulation  . Personal history of colonic polyps 01/22/2000   Tubular adenoma  . RBBB (right bundle branch block)   . Sleep apnea    no cpap   . 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   in situ-left forearm   Past Surgical History:  Procedure Laterality Date  . COLECTOMY  1992   w/ colostomy  related to colon cancer  . COLON SURGERY  1993   to take down Colostomy  . COLONOSCOPY    . CORONARY ARTERY BYPASS GRAFT  2007  . CORONARY STENT INTERVENTION N/A 10/07/2018   Procedure: CORONARY STENT INTERVENTION;  Surgeon: Adrian Prows, MD;  Location: [facility];  Service: Cardiovascular;  Laterality: N/A;  . HAND [place]   tendon repair and nerve; left  . INGUINAL HERNIA REPAIR  2009  . LAPAROSCOPIC CHOLECYSTECTOMY  2004  .  POLYPECTOMY    . RIGHT/LEFT HEART CATH AND CORONARY/GRAFT ANGIOGRAPHY N/A 07/03/2017   Procedure: Right/Left Heart Cath and Coronary/Graft Angiography;  Surgeon: Leonie Man, MD;  Location: [facility];  Service: Cardiovascular;  Laterality: N/A;  . RIGHT/LEFT HEART CATH AND CORONARY/GRAFT ANGIOGRAPHY N/A 10/07/2018   Procedure: RIGHT/LEFT HEART CATH AND CORONARY/GRAFT ANGIOGRAPHY;  Surgeon: Adrian Prows, MD;  Location: [facility];  Service: Cardiovascular;  Laterality: N/A;  . UMBILICAL HERNIA REPAIR    . VENTRAL HERNIA REPAIR  2009   Family History  Problem Relation Age of Onset  . Lung cancer Mother   . Heart disease Mother   . Emphysema Father    . Heart disease Father   . Cancer Sister 69       unknown type  . Breast cancer Sister        dx. >50  . Colon cancer Sister        dx. >50  . Breast cancer Sister        dx. >50  . Colon cancer Sister        dx. >50  . Colon cancer Cousin        dx. early 83s  . Esophageal cancer Neg Hx   . Rectal cancer Neg Hx   . Stomach cancer Neg Hx   . Colon polyps Neg Hx    Social History   Tobacco Use  . Smoking status: Former Smoker    Packs/day: 1.00    Years: 45.00    Pack years: 45.00    Types: Cigarettes    Quit date: 12/18/1995    Years since quitting: 25.3  . Smokeless tobacco: Never Used  Substance Use Topics  . Alcohol use: No    Comment: quit in 1980   Marital Status: Married   Review of Systems  Constitutional: Positive for malaise/fatigue. Negative for decreased appetite, weight gain and weight loss.  Eyes: Negative for visual disturbance.  Cardiovascular: Positive for dyspnea on exertion and palpitations. Negative for chest pain, claudication, leg swelling, near-syncope, orthopnea, paroxysmal nocturnal dyspnea and syncope.  Respiratory: Negative for hemoptysis, shortness of breath and wheezing.   Endocrine: Negative for cold intolerance and heat intolerance.  Hematologic/Lymphatic: Does not bruise/bleed easily.  Skin: Negative for nail changes.  Musculoskeletal: Positive for back pain and joint pain (knee). Negative for muscle weakness and myalgias.  Gastrointestinal: Negative for abdominal pain, change in bowel habit, melena, nausea and vomiting.  Neurological: Negative for difficulty with concentration, dizziness, focal weakness, headaches and weakness.  Psychiatric/Behavioral: Negative for altered mental status and suicidal ideas.  All other systems reviewed and are negative.  Objective:  Blood pressure 106/66, pulse 94, temperature 98 F (36.7 C), temperature source Temporal, resp. rate 17, height 5\' 11"  (1.803 m), weight 247 lb 6.4 oz (112.2 kg), SpO2 95 %.  Body mass index is 34.51 kg/m.  Vitals with BMI 05/02/2021 04/19/2021 03/30/2021  Height 5\' 11"  5\' 11"  5\' 11"   Weight 247 lbs 6 oz 244 lbs 13 oz 238 lbs  BMI 34.52 63.87 56.43  Systolic 329 518 841  Diastolic 66 72 74  Pulse 94 73 76    Physical Exam Vitals reviewed.  Constitutional:      Appearance: He is well-developed.  HENT:     Head: Normocephalic and atraumatic.  Cardiovascular:     Rate and Rhythm: Tachycardia present. Rhythm irregularly irregular.     Pulses: Intact distal pulses.          Femoral  pulses are 2+ on the right side and 2+ on the left side.      Popliteal pulses are 1+ on the right side and 1+ on the left side.       Dorsalis pedis pulses are 1+ on the right side and 1+ on the left side.       Posterior tibial pulses are 2+ on the right side and 2+ on the left side.     Heart sounds: Normal heart sounds, S1 normal and S2 normal. No murmur heard. No gallop.   Pulmonary:     Effort: Pulmonary effort is normal. No accessory muscle usage or respiratory distress.     Breath sounds: Normal breath sounds. No wheezing, rhonchi or rales.  Musculoskeletal:        General: Normal range of motion.     Cervical back: Normal range of motion.     Right lower leg: No edema.     Left lower leg: No edema.  Skin:    General: Skin is warm and dry.  Neurological:     Mental Status: He is alert and oriented to person, place, and time.     Radiology    Chest x-ray 03/25/2021: 1. Low volume film with cardiomegaly and vascular congestion. 2. Patchy airspace disease in the left base compatible with atelectasis or pneumonia. 3. Tiny nodular density peripheral right mid lung is stable, suggesting benign etiology.  CTA of chest 09/16/2019:  4.4 cm ascending thoracic aortic aneurysm. Recommend annual imaging followup by CTA or MRA. This recommendation follows 2010 ACCF/AHA/AATS/ACR/ASA/SCA/SCAI/SIR/STS/SVM Guidelines for the Diagnosis and Management of Patients with Thoracic  Aortic Disease. Circulation. 2010; 121: X211-H417. Aortic aneurysm NOS (ICD10-I71.9)  Old granulomatous disease. No acute cardiopulmonary disease. Aortic Atherosclerosis (ICD10-I70.0).  Laboratory examination:   CMP Latest Ref Rng & Units 03/26/2021 03/25/2021 10/08/2018  Glucose 70 - 99 mg/dL 93 125(H) 111(H)  BUN 8 - 23 mg/dL 20 19 10   Creatinine 0.61 - 1.24 mg/dL 1.08 1.20 1.21  Sodium 135 - 145 mmol/L 134(L) 137 139  Potassium 3.5 - 5.1 mmol/L 3.4(L) 4.0 3.8  Chloride 98 - 111 mmol/L 100 103 106  CO2 22 - 32 mmol/L 25 23 28   Calcium 8.9 - 10.3 mg/dL 8.5(L) 9.2 8.9  Total Protein 6.5 - 8.1 g/dL - 7.2 -  Total Bilirubin 0.3 - 1.2 mg/dL - 0.5 -  Alkaline Phos 38 - 126 U/L - 54 -  AST 15 - 41 U/L - 28 -  ALT 0 - 44 U/L - 34 -   CBC Latest Ref Rng & Units 03/26/2021 03/25/2021 10/08/2018  WBC 4.0 - 10.5 K/uL 9.2 9.8 6.5  Hemoglobin 13.0 - 17.0 g/dL 14.3 14.5 13.3  Hematocrit 39.0 - 52.0 % 43.9 44.3 43.0  Platelets 150 - 400 K/uL 221 235 174   Lipid Panel     Component Value Date/Time   CHOL 134 09/08/2015 1210   TRIG 211.0 (H) 09/08/2015 1210   HDL 36.10 (L) 09/08/2015 1210   CHOLHDL 4 09/08/2015 1210   VLDL 42.2 (H) 09/08/2015 1210   LDLCALC 59 07/23/2014 1638   LDLDIRECT 87.0 09/08/2015 1210   HEMOGLOBIN A1C No results found for: HGBA1C, MPG TSH Recent Labs    03/25/21 1443  TSH 0.630    External Labs:  Cholesterol, total 128.000 m 06/22/2020 HDL 34.000 mg 06/22/2020 LDL-C 63.000 mg 06/22/2020 Triglycerides 183.000 m 06/22/2020  Hemoglobin 14.900 g/d 06/22/2020  Creatinine, Serum 1.250 mg/ 06/22/2020 Potassium 3.900 mm 06/22/2020 ALT (SGPT) 30.000 U/L  06/22/2020  TSH 2.120 06/22/2020   Medications and Allergies   Allergies  Allergen Reactions  . Morphine Sulfate Other (See Comments)  . Rosuvastatin Calcium Other (See Comments)  . Ultram [Tramadol] Other (See Comments)    Current Outpatient Medications on File Prior to Visit  Medication Sig Dispense Refill  .  acetaminophen (TYLENOL) 325 MG tablet Take 2 tablets (650 mg total) by mouth every 6 (six) hours as needed for mild pain (or Fever >/= 101). 12 tablet 0  . albuterol (PROVENTIL HFA;VENTOLIN HFA) 108 (90 Base) MCG/ACT inhaler Inhale 2 puffs into the lungs every 4 (four) hours as needed for wheezing or shortness of breath. 1 Inhaler 5  . albuterol (PROVENTIL) (2.5 MG/3ML) 0.083% nebulizer solution USE 1 VIAL IN NEBULIZER EVERY 6 HOURS AS NEEDED FOR WHEEZING OR SHORTNESS OF BREATH 75 mL 3  . apixaban (ELIQUIS) 5 MG TABS tablet Take 1 tablet (5 mg total) by mouth 2 (two) times daily. 60 tablet 3  . atorvastatin (LIPITOR) 20 MG tablet Take 20 mg by mouth at bedtime.   5  . clonazePAM (KLONOPIN) 1 MG tablet Take 1 mg by mouth as needed.    . doxycycline (VIBRA-TABS) 100 MG tablet Take 1 tablet (100 mg total) by mouth 2 (two) times daily. 14 tablet 0  . fluticasone (FLONASE) 50 MCG/ACT nasal spray Place 2 sprays into both nostrils daily. (Patient taking differently: Place 2 sprays into both nostrils as needed.) 16 g 2  . gabapentin (NEURONTIN) 100 MG capsule Take 100 mg by mouth 3 (three) times daily.    Marland Kitchen guaiFENesin (MUCINEX) 600 MG 12 hr tablet Take 600 mg by mouth 2 (two) times daily as needed.     Marland Kitchen HYDROcodone-acetaminophen (NORCO/VICODIN) 5-325 MG tablet Take 1 tablet by mouth every 6 (six) hours as needed for moderate pain.    . isosorbide mononitrate (IMDUR) 30 MG 24 hr tablet Take 1 tablet by mouth once daily 90 tablet 1  . ketoconazole (NIZORAL) 2 % cream Apply 1 application topically daily.    . metoprolol tartrate (LOPRESSOR) 50 MG tablet Take 1 tablet (50 mg total) by mouth 2 (two) times daily. 180 tablet 3  . Multiple Vitamin (MULTIVITAMIN WITH MINERALS) TABS tablet Take 1 tablet by mouth daily.    . nitroGLYCERIN (NITROSTAT) 0.4 MG SL tablet Place 1 tablet (0.4 mg total) under the tongue every 5 (five) minutes as needed for chest pain. 25 tablet 3  . Omega-3 Fatty Acids (FISH OIL) 1200 MG  CAPS Take 1,200 mg by mouth daily.    Marland Kitchen omeprazole (PRILOSEC) 20 MG capsule Take 1 capsule (20 mg total) by mouth daily. 30 capsule 3  . Potassium Chloride ER 20 MEQ TBCR Take 1 tablet by mouth daily.    Marland Kitchen STIOLTO RESPIMAT 2.5-2.5 MCG/ACT AERS INHALE 2 PUFFS BY MOUTH ONCE DAILY (Patient taking differently: Inhale 2 puffs into the lungs daily.) 4 g 5  . tamsulosin (FLOMAX) 0.4 MG CAPS capsule Take 0.8 mg by mouth every evening.     Marland Kitchen tiZANidine (ZANAFLEX) 4 MG tablet Take 4 mg by mouth at bedtime as needed for muscle spasms.    Marland Kitchen torsemide (DEMADEX) 20 MG tablet Take 40 mg by mouth daily.     No current facility-administered medications on file prior to visit.     Cardiac Studies:   CABG 2007: LIMA to LAD, SVG to D2, SVG to OM1 and ramus intermediate with OM1 limb occluded, SVG to RCA with a proximal 50% stenosis by  coronary angiography in July 2018. Normal LVEF.  Lexiscan sestamibi stress test 12/03/2016: No significant ST-T wave changes during stress test. Medium defect of moderate intensity in the basal inferior, mid inferoseptal, mid inferior and mid inferolateral and apical inferior and apical lateral location consistent with Myocardial infarction mild degree of peri-infarct ischemia. LVEF 52%. Intermediate risk study.  Echocardiogram 09/26/2018: Left ventricle cavity is normal in size. Moderate concentric hypertrophy of the left ventricle. Abnormal septal wall motion due to post-operative coronary artery bypass graft. Doppler evidence of grade I (impaired) diastolic dysfunction, normal LAP. Calculated EF 56%. Left atrial cavity is normal in size. The interatrial Septum is thin and mobile but appears to be intact by 2D and CF Doppler interrogation. The aortic root is mildly dilated at 4.11 cm. Compared to Echocardiogram 03/19/2017: Normal LV systolic function, severe LVH, EF 0000000, grade 1 diastolic dysfunction. Right ventricle mildly dilated.  Coronary angiogram 10/08/2018 and had a very  high-grade stenosis of a large hiatal D1/ramus branch S/P 3.0 x 23 mm Sierra Xience DES. Patent LIMA to LAD, SVG to D2 and SVG to RCA widely patent. Normal right heart catheterization, preserved cardiac output and cardiac index.  Echocardiogram 03/26/2021: 1. Left ventricular ejection fraction, by estimation, is 35 to 40%. The  left ventricle has moderately decreased function. The left ventricle  demonstrates global hypokinesis. There is mild left ventricular  hypertrophy. Left ventricular diastolic  parameters are indeterminate.  2. Right ventricular systolic function is normal. The right ventricular  size is normal.  3. Left atrial size was mildly dilated.  4. The mitral valve is normal in structure. No evidence of mitral valve  regurgitation. No evidence of mitral stenosis.  5. The aortic valve is tricuspid. There is mild calcification of the  aortic valve. Aortic valve regurgitation is trivial. Mild aortic valve  sclerosis is present, with no evidence of aortic valve stenosis.  6. The inferior vena cava is normal in size with greater than 50%  respiratory variability, suggesting right atrial pressure of 3 mmHg.   EKG:   05/03/2021: Atrial fibrillation with rapid ventricular response at a rate of 103 bpm.  Normal axis.  Right bundle branch block, secondary ST-T wave changes.  Compared to EKG 03/30/2021, ventricular rate controlled improved otherwise no significant change.  ED EKG 03/25/2021: Atrial flutter with 4-1 AV block at a rate of 128 bpm.  10/12/2020: Sinus rhythm with  first-degree AV block at rate of 82 bpm, normal axis, right bundle branch block.   Assessment:     ICD-10-CM   1. Coronary artery disease involving coronary bypass graft of native heart without angina pectoris  I25.810 EKG 12-Lead    CBC    Basic metabolic panel  2. Persistent atrial fibrillation (HCC)  I48.19 EKG 12-Lead    CBC    Basic metabolic panel    No orders of the defined types were placed in  this encounter.  There are no discontinued medications.  This patients CHA2DS2-VASc Score 5 (CHF, HTN, vasc, age) and yearly risk of stroke 7.2%.   Recommendations:   [name]  is a 80 y.o. with known coronary artery disease and CABG in 2007 in the setting of non-STEMI in atrial fibrillation with RVR, no known recurrence since then. He has a history of  45 pack year h/o smoking tobacco, ascending aortic aneurysm (follows Dr.Bryan Bartle), small abdominal aortic aneurysm noted in 2015 (3cm), H/O colon cancer, in remission since 1992, GERD, hyperlipidemia, obstructive sleep apnea not using CPAP.  Patient hospitalized 03/2021 with recurrence of atrial flutter and echocardiogram revealed new onset LVEF 35-40% with global hypokinesis. At last visit switch patient to metoprolol tartrate 50 mg BID, started Eliquis 5 mg BID.   Patient now presents for 4 week follow up, he has now been on anticoagulation for 4 weeks.  Patient continues to have symptoms likely related to underlying atrial fibrillation as EKG reveals A. fib with rapid ventricular response at a rate of 103 bpm today.  Suspect patient's fatigue and dyspnea to be related to atrial fibrillation.  As patient has now been on anticoagulation for >4 weeks recommend proceeding with cardioversion.  The direct-current cardioversion procedure was explained to the patient in detail. The indication, alternatives, risks and benefits were reviewed. Complications including but not limited to heart rhythm disturbances, cerebrovascular accident, and rarely death were reviewed and discussed with the patient. The patient voices understanding and wishes to proceed.   Patient is tolerating present medications, therefore will not make changes at this time.  Rate control has improved with increased dose of metoprolol.  Although blood pressure remains soft, symptoms are likely related to underlying atrial fibrillation rather than hypotension, will continue to  monitor closely particularly following cardioversion.  In view of patient's reduced LVEF in the setting of atrial fibrillation/flutter will likely pursue rhythm management strategy as well.  We will also consider repeat ischemic evaluation when patient is more stable from atrial fibrillation standpoint.   Follow-up in approximately 4 weeks following cardioversion.   Alethia Berthold, PA-C 05/03/2021, 12:24 PM Office: [phone]

## 2021-05-02 ENCOUNTER — Ambulatory Visit: Payer: Medicare PPO | Admitting: Student

## 2021-05-02 ENCOUNTER — Other Ambulatory Visit: Payer: Self-pay

## 2021-05-02 ENCOUNTER — Encounter: Payer: Self-pay | Admitting: Student

## 2021-05-02 ENCOUNTER — Ambulatory Visit: Payer: Medicare PPO | Admitting: Cardiology

## 2021-05-02 VITALS — BP 106/66 | HR 94 | Temp 98.0°F | Resp 17 | Ht 71.0 in | Wt 247.4 lb

## 2021-05-02 DIAGNOSIS — I25810 Atherosclerosis of coronary artery bypass graft(s) without angina pectoris: Secondary | ICD-10-CM | POA: Diagnosis not present

## 2021-05-02 DIAGNOSIS — I4819 Other persistent atrial fibrillation: Secondary | ICD-10-CM

## 2021-05-05 ENCOUNTER — Encounter: Payer: Self-pay | Admitting: Physician Assistant

## 2021-05-05 NOTE — Progress Notes (Signed)
   Follow-Up Visit   Subjective  [name] is a 80 y.o. male who presents for the following: Annual Exam (Scale on top and bottom lip x years).   The following portions of the chart were reviewed this encounter and updated as appropriate:  Tobacco  Allergies  Meds  Problems  Med Hx  Surg Hx  Fam Hx      Objective  Well appearing patient in no apparent distress; mood and affect are within normal limits.  All skin waist up examined.  Objective  Right Labial Mucosa of the Upper Lip: Dark purple papule  Objective  Left Hand - Posterior (4), Right Hand - Posterior (4), Right Nasofacial Sulcus, Right Temporal Scalp: Erythematous patches with gritty scale.  Objective  Left Paraspinal, Lower: Dyspigmented scar.    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) Left Hand - Posterior (4); Right Hand - Posterior (4); Right Nasofacial Sulcus; Right Temporal Scalp  Destruction of lesion - Left Hand - Posterior, Right Hand - Posterior, Right Nasofacial Sulcus, Right Temporal Scalp Complexity: simple   Destruction method: cryotherapy   Informed consent: discussed and consent obtained   Timeout:  patient name, date of birth, surgical site, and procedure verified Lesion destroyed using liquid nitrogen: Yes   Cryotherapy cycles:  3 Outcome: patient tolerated procedure well with no complications   Post-procedure details: wound care instructions given    History of squamous cell carcinoma Left Paraspinal, Lower  observe    I, ,, PA-C, have reviewed all documentation's for this visit.  The documentation on 05/05/21 for the exam, diagnosis, procedures and orders are all accurate and complete.

## 2021-05-23 ENCOUNTER — Ambulatory Visit (HOSPITAL_COMMUNITY): Payer: Medicare PPO | Admitting: Anesthesiology

## 2021-05-23 ENCOUNTER — Other Ambulatory Visit: Payer: Self-pay

## 2021-05-23 ENCOUNTER — Ambulatory Visit (HOSPITAL_COMMUNITY): Payer: Medicare PPO

## 2021-05-23 ENCOUNTER — Encounter (HOSPITAL_COMMUNITY)
Admission: RE | Disposition: A | Discharge status: Home / Self Care | Payer: Self-pay | Source: Home / Self Care | Attending: Cardiology

## 2021-05-23 ENCOUNTER — Ambulatory Visit (HOSPITAL_COMMUNITY)
Admission: RE | Admit: 2021-05-23 | Discharge: 2021-05-23 | Disposition: A | Discharge status: Home / Self Care | Payer: Medicare PPO | Attending: Cardiology | Admitting: Cardiology

## 2021-05-23 DIAGNOSIS — I5043 Acute on chronic combined systolic (congestive) and diastolic (congestive) heart failure: Secondary | ICD-10-CM | POA: Diagnosis not present

## 2021-05-23 DIAGNOSIS — G4733 Obstructive sleep apnea (adult) (pediatric): Secondary | ICD-10-CM | POA: Diagnosis not present

## 2021-05-23 DIAGNOSIS — E785 Hyperlipidemia, unspecified: Secondary | ICD-10-CM | POA: Diagnosis not present

## 2021-05-23 DIAGNOSIS — Z8719 Personal history of other diseases of the digestive system: Secondary | ICD-10-CM | POA: Insufficient documentation

## 2021-05-23 DIAGNOSIS — Z801 Family history of malignant neoplasm of trachea, bronchus and lung: Secondary | ICD-10-CM | POA: Insufficient documentation

## 2021-05-23 DIAGNOSIS — E7800 Pure hypercholesterolemia, unspecified: Secondary | ICD-10-CM | POA: Diagnosis not present

## 2021-05-23 DIAGNOSIS — Z800 Family history of malignant neoplasm of digestive organs: Secondary | ICD-10-CM | POA: Diagnosis not present

## 2021-05-23 DIAGNOSIS — N400 Enlarged prostate without lower urinary tract symptoms: Secondary | ICD-10-CM | POA: Insufficient documentation

## 2021-05-23 DIAGNOSIS — I2510 Atherosclerotic heart disease of native coronary artery without angina pectoris: Secondary | ICD-10-CM | POA: Diagnosis not present

## 2021-05-23 DIAGNOSIS — Z9049 Acquired absence of other specified parts of digestive tract: Secondary | ICD-10-CM | POA: Insufficient documentation

## 2021-05-23 DIAGNOSIS — Z85828 Personal history of other malignant neoplasm of skin: Secondary | ICD-10-CM | POA: Diagnosis not present

## 2021-05-23 DIAGNOSIS — Q211 Atrial septal defect: Secondary | ICD-10-CM | POA: Insufficient documentation

## 2021-05-23 DIAGNOSIS — Z809 Family history of malignant neoplasm, unspecified: Secondary | ICD-10-CM | POA: Diagnosis not present

## 2021-05-23 DIAGNOSIS — I088 Other rheumatic multiple valve diseases: Secondary | ICD-10-CM | POA: Diagnosis not present

## 2021-05-23 DIAGNOSIS — Z955 Presence of coronary angioplasty implant and graft: Secondary | ICD-10-CM | POA: Insufficient documentation

## 2021-05-23 DIAGNOSIS — I340 Nonrheumatic mitral (valve) insufficiency: Secondary | ICD-10-CM | POA: Diagnosis not present

## 2021-05-23 DIAGNOSIS — J449 Chronic obstructive pulmonary disease, unspecified: Secondary | ICD-10-CM | POA: Diagnosis not present

## 2021-05-23 DIAGNOSIS — Z951 Presence of aortocoronary bypass graft: Secondary | ICD-10-CM | POA: Diagnosis not present

## 2021-05-23 DIAGNOSIS — Z86711 Personal history of pulmonary embolism: Secondary | ICD-10-CM | POA: Diagnosis not present

## 2021-05-23 DIAGNOSIS — Z87891 Personal history of nicotine dependence: Secondary | ICD-10-CM | POA: Diagnosis not present

## 2021-05-23 DIAGNOSIS — Z8249 Family history of ischemic heart disease and other diseases of the circulatory system: Secondary | ICD-10-CM | POA: Insufficient documentation

## 2021-05-23 DIAGNOSIS — Z825 Family history of asthma and other chronic lower respiratory diseases: Secondary | ICD-10-CM | POA: Insufficient documentation

## 2021-05-23 DIAGNOSIS — I480 Paroxysmal atrial fibrillation: Secondary | ICD-10-CM | POA: Diagnosis not present

## 2021-05-23 DIAGNOSIS — Z803 Family history of malignant neoplasm of breast: Secondary | ICD-10-CM | POA: Diagnosis not present

## 2021-05-23 DIAGNOSIS — I10 Essential (primary) hypertension: Secondary | ICD-10-CM | POA: Insufficient documentation

## 2021-05-23 DIAGNOSIS — Z9989 Dependence on other enabling machines and devices: Secondary | ICD-10-CM | POA: Diagnosis not present

## 2021-05-23 DIAGNOSIS — I4891 Unspecified atrial fibrillation: Secondary | ICD-10-CM | POA: Diagnosis not present

## 2021-05-23 HISTORY — PX: CARDIOVERSION: SHX1299

## 2021-05-23 HISTORY — PX: TEE WITHOUT CARDIOVERSION: SHX5443

## 2021-05-23 SURGERY — CARDIOVERSION
Anesthesia: General

## 2021-05-23 MED ORDER — LACTATED RINGERS IV SOLN: INTRAVENOUS | Status: DC

## 2021-05-23 MED ORDER — LIDOCAINE 2% (20 MG/ML) 5 ML SYRINGE
INTRAMUSCULAR | Status: DC | PRN
  Administered 2021-05-23: 60 mg via INTRAVENOUS

## 2021-05-23 MED ORDER — PHENYLEPHRINE 40 MCG/ML (10ML) SYRINGE FOR IV PUSH (FOR BLOOD PRESSURE SUPPORT)
PREFILLED_SYRINGE | INTRAVENOUS | Status: DC | PRN
  Administered 2021-05-23 (×2): 120 ug via INTRAVENOUS
  Administered 2021-05-23 (×2): 80 ug via INTRAVENOUS

## 2021-05-23 MED ORDER — SODIUM CHLORIDE 0.9 % IV SOLN: INTRAVENOUS | Status: DC

## 2021-05-23 MED ORDER — PROPOFOL 10 MG/ML IV BOLUS
INTRAVENOUS | Status: DC | PRN
  Administered 2021-05-23: 25 mg via INTRAVENOUS
  Administered 2021-05-23 (×2): 50 mg via INTRAVENOUS

## 2021-05-23 MED ORDER — SODIUM CHLORIDE 0.9 % IV SOLN: INTRAVENOUS | Status: DC | PRN

## 2021-05-23 MED ORDER — PHENYLEPHRINE HCL-NACL 10-0.9 MG/250ML-% IV SOLN
INTRAVENOUS | Status: DC | PRN
  Administered 2021-05-23: 100 ug/min via INTRAVENOUS

## 2021-05-23 MED ORDER — PROPOFOL 500 MG/50ML IV EMUL
INTRAVENOUS | Status: DC | PRN
  Administered 2021-05-23: 25 ug/kg/min via INTRAVENOUS

## 2021-05-23 NOTE — CV Procedure (Signed)
TEE: Under moderate sedation, TEE was performed without complications: LV: Moderate global hypokinesis. LVEF 35-40% RV: Moderately dilated LA: Severely dilated. Left atrial appendage: Normal without thrombus. Normal function.  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 TV: Mild TR AV: Normal. No AI or AS. PV: Normal. Trace PI.  Thoracic and ascending aorta: Mild plauq  See anesthesiology notes re: deep sedation.  Patient tolerated the procedure well.  Direct current cardioversion:  Indication symptomatic: Symptomatic atrial fibrillation  Procedure: Under deep sedation administered and monitored by anesthesiology, synchronized direct current cardioversion performed. Patient was delivered with 120, 150 Joules of electricity X 2 with success to NSR. Patient tolerated the procedure well. No immediate complication noted.   Nigel Mormon, MD [facility]. [place] Pager: [phone] Office: [phone] If no answer Cell [phone]

## 2021-05-23 NOTE — Interval H&P Note (Signed)
History and Physical Interval Note:  05/23/2021 1:26 PM  [name]  has presented today for surgery, with the diagnosis of PERSISTANT AFIB.  The various methods of treatment have been discussed with the patient and family. After consideration of risks, benefits and other options for treatment, the patient has consented to  Procedure(s): TEE and CARDIOVERSION (N/A) as a surgical intervention.  The patient's history has been reviewed, patient examined, no change in status, stable for surgery.  I have reviewed the patient's chart and labs.  Questions were answered to the patient's satisfaction.     [place]

## 2021-05-23 NOTE — Anesthesia Preprocedure Evaluation (Addendum)
Anesthesia Evaluation  Patient identified by MRN, date of birth, ID band Patient awake    Reviewed: Allergy & Precautions, NPO status , Patient's Chart, lab work & pertinent test results  Airway Mallampati: III  TM Distance: >3 FB     Dental  (+) Dental Advisory Given   Pulmonary sleep apnea , COPD, former smoker,    breath sounds clear to auscultation       Cardiovascular hypertension, + CAD, + CABG and +CHF (EF 35-40%)  + dysrhythmias Atrial Fibrillation  Rhythm:Irregular Rate:Normal     Neuro/Psych negative neurological ROS     GI/Hepatic Neg liver ROS, GERD  ,  Endo/Other  negative endocrine ROS  Renal/GU negative Renal ROS     Musculoskeletal  (+) Arthritis ,   Abdominal   Peds  Hematology negative hematology ROS (+)   Anesthesia Other Findings   Reproductive/Obstetrics                             Anesthesia Physical Anesthesia Plan  ASA: IV  Anesthesia Plan: General   Post-op Pain Management:    Induction:   PONV Risk Score and Plan: 2 and Propofol infusion and Treatment may vary due to age or medical condition  Airway Management Planned: Natural Airway, Nasal Cannula and Mask  Additional Equipment:   Intra-op Plan:   Post-operative Plan:   Informed Consent: I have reviewed the patients History and Physical, chart, labs and discussed the procedure including the risks, benefits and alternatives for the proposed anesthesia with the patient or authorized representative who has indicated his/her understanding and acceptance.       Plan Discussed with:   Anesthesia Plan Comments:         Anesthesia Quick Evaluation

## 2021-05-23 NOTE — Anesthesia Procedure Notes (Signed)
Procedure Name: MAC Date/Time: 05/23/2021 1:48 PM Performed by: Renato Shin, CRNA Pre-anesthesia Checklist: Patient identified, Emergency Drugs available, Suction available and Patient being monitored Patient Re-evaluated:Patient Re-evaluated prior to induction Preoxygenation: Pre-oxygenation with 100% oxygen Induction Type: IV induction Airway Equipment and Method: Bite block Placement Confirmation: positive ETCO2 and breath sounds checked- equal and bilateral Dental Injury: Teeth and Oropharynx as per pre-operative assessment

## 2021-05-23 NOTE — Transfer of Care (Signed)
Immediate Anesthesia Transfer of Care Note  Patient: Linton Ham Mantia  Procedure(s) Performed: CARDIOVERSION (N/A ) TRANSESOPHAGEAL ECHOCARDIOGRAM (TEE)  Patient Location: PACU and Endoscopy Unit  Anesthesia Type:MAC  Level of Consciousness: drowsy and patient cooperative  Airway & Oxygen Therapy: Patient Spontanous Breathing and Patient connected to nasal cannula oxygen  Post-op Assessment: Report given to RN and Post -op Vital signs reviewed and stable  Post vital signs: Reviewed and stable  Last Vitals:  Vitals Value Taken Time  BP    Temp    Pulse 78 05/23/21 1412  Resp 20 05/23/21 1412  SpO2 96 % 05/23/21 1412  Vitals shown include unvalidated device data.  Last Pain:  Vitals:   05/23/21 1331  TempSrc: Oral  PainSc: 0-No pain         Complications: No complications documented.

## 2021-05-23 NOTE — Discharge Instructions (Signed)
Transesophageal Echocardiogram Transesophageal echocardiogram (TEE) is a test that uses sound waves to take pictures of your heart. TEE is done by passing a small probe attached to a flexible tube down the part of the body that moves food from your mouth to your stomach (esophagus). The pictures give clear images of your heart. This can help your doctor see if there are problems with your heart. Tell a doctor about:  Any allergies you have.  All medicines you are taking. This includes vitamins, herbs, eye drops, creams, and over-the-counter medicines.  Any problems you or family members have had with anesthetic medicines.  Any blood disorders you have.  Any surgeries you have had.  Any medical conditions you have.  Any swallowing problems.  Whether you have or have had a blockage in the part of the body that moves food from your mouth to your stomach.  Whether you are pregnant or may be pregnant. What are the risks? In general, this is a safe procedure. But, problems may occur, such as:  Damage to nearby structures or organs.  A tear in the part of the body that moves food from your mouth to your stomach.  Irregular heartbeat.  Hoarse voice or trouble swallowing.  Bleeding. What happens before the procedure? Medicines  Ask your doctor about changing or stopping: ? Your normal medicines. ? Vitamins, herbs, and supplements. ? Over-the-counter medicines.  Do not take aspirin or ibuprofen unless you are told to. General instructions  Follow instructions from your doctor about what you cannot eat or drink.  You will take out any dentures or dental retainers.  Plan to have a responsible adult take you home from the hospital or clinic.  Plan to have a responsible adult care for you for the time you are told after you leave the hospital or clinic. This is important. What happens during the procedure?  An IV will be put into one of your veins.  You may be given: ? A  sedative. This medicine helps you relax. ? A medicine to numb the back of your throat. This may be sprayed or gargled.  Your blood pressure, heart rate, and breathing will be watched.  You may be asked to lie on your left side.  A bite block will be placed in your mouth. This keeps you from biting the tube.  The tip of the probe will be placed into the back of your mouth.  You will be asked to swallow.  Your doctor will take pictures of your heart.  The probe and bite block will be taken out after the test is done. The procedure may vary among doctors and hospitals.   What can I expect after the procedure?  You will be monitored until you leave the hospital or clinic. This includes checking your blood pressure, heart rate, breathing rate, and blood oxygen level.  Your throat may feel sore and numb. This will get better over time. You will not be allowed to eat or drink until the numbness has gone away.  It is common to have a sore throat for a day or two.  It is up to you to get the results of your procedure. Ask how to get your results when they are ready. Follow these instructions at home:  If you were given a sedative during your procedure, do not drive or use machines until your doctor says that it is safe.  Return to your normal activities when your doctor says that it is safe.    Keep all follow-up visits. Summary  TEE is a test that uses sound waves to take pictures of your heart.  You will be given a medicine to help you relax.  Do not drive or use machines until your doctor says that it is safe. This information is not intended to replace advice given to you by your health care provider. Make sure you discuss any questions you have with your health care provider. Document Revised: 07/26/2020 Document Reviewed: 07/26/2020 Elsevier Patient Education  2021 [place]. Hospital doctor cardioversion is the delivery of a jolt of electricity to  restore a normal rhythm to the heart. A rhythm that is too fast or is not regular keeps the heart from pumping well. In this procedure, sticky patches or metal paddles are placed on the chest to deliver electricity to the heart from a device. This procedure may be done in an emergency if:  There is low or no blood pressure as a result of the heart rhythm.  Normal rhythm must be restored as fast as possible to protect the brain and heart from further damage.  It may save a life. This may also be a scheduled procedure for irregular or fast heart rhythms that are not immediately life-threatening. Tell a health care provider about:  Any allergies you have.  All medicines you are taking, including vitamins, herbs, eye drops, creams, and over-the-counter medicines.  Any problems you or family members have had with anesthetic medicines.  Any blood disorders you have.  Any surgeries you have had.  Any medical conditions you have.  Whether you are pregnant or may be pregnant. What are the risks? Generally, this is a safe procedure. However, problems may occur, including:  Allergic reactions to medicines.  A blood clot that breaks free and travels to other parts of your body.  The possible return of an abnormal heart rhythm within hours or days after the procedure.  Your heart stopping (cardiac arrest). This is rare. What happens before the procedure? Medicines  Your health care provider may have you start taking: ? Blood-thinning medicines (anticoagulants) so your blood does not clot as easily. ? Medicines to help stabilize your heart rate and rhythm.  Ask your health care provider about: ? Changing or stopping your regular medicines. This is especially important if you are taking diabetes medicines or blood thinners. ? Taking medicines such as aspirin and ibuprofen. These medicines can thin your blood. Do not take these medicines unless your health care provider tells you to take  them. ? Taking over-the-counter medicines, vitamins, herbs, and supplements. General instructions  Follow instructions from your health care provider about eating or drinking restrictions.  Plan to have someone take you home from the hospital or clinic.  If you will be going home right after the procedure, plan to have someone with you for 24 hours.  Ask your health care provider what steps will be taken to help prevent infection. These may include washing your skin with a germ-killing soap. What happens during the procedure?  An IV will be inserted into one of your veins.  Sticky patches (electrodes) or metal paddles may be placed on your chest.  You will be given a medicine to help you relax (sedative).  An electrical shock will be delivered. The procedure may vary among health care providers and hospitals.   What can I expect after the procedure?  Your blood pressure, heart rate, breathing rate, and blood oxygen level will be monitored until you leave  the hospital or clinic.  Your heart rhythm will be watched to make sure it does not change.  You may have some redness on the skin where the shocks were given. Follow these instructions at home:  Do not drive for 24 hours if you were given a sedative during your procedure.  Take over-the-counter and prescription medicines only as told by your health care provider.  Ask your health care provider how to check your pulse. Check it often.  Rest for 48 hours after the procedure or as told by your health care provider.  Avoid or limit your caffeine use as told by your health care provider.  Keep all follow-up visits as told by your health care provider. This is important. Contact a health care provider if:  You feel like your heart is beating too quickly or your pulse is not regular.  You have a serious muscle cramp that does not go away. Get help right away if:  You have discomfort in your chest.  You are dizzy or you  feel faint.  You have trouble breathing or you are short of breath.  Your speech is slurred.  You have trouble moving an arm or leg on one side of your body.  Your fingers or toes turn cold or blue. Summary  Electrical cardioversion is the delivery of a jolt of electricity to restore a normal rhythm to the heart.  This procedure may be done right away in an emergency or may be a scheduled procedure if the condition is not an emergency.  Generally, this is a safe procedure.  After the procedure, check your pulse often as told by your health care provider. This information is not intended to replace advice given to you by your health care provider. Make sure you discuss any questions you have with your health care provider. Document Revised: 07/06/2019 Document Reviewed: 07/06/2019 Elsevier Patient Education  [place].

## 2021-05-23 NOTE — H&P (Signed)
OV 05/02/2021 copied for documentation    Primary Physician:  Shirline Frees, MD   Patient ID: [name], male    DOB: 1940/12/20, 80 y.o.   MRN: [id number]  Subjective:    C/C: Atrial fibrillation  HPI: [name]  is a 80 y.o. male  with known coronary artery disease and CABG in 2007 in the setting of non-STEMI in atrial fibrillation with RVR, no known recurrence since then. He has a history of  45 pack year h/o smoking tobacco, ascending aortic aneurysm (follows Dr.Bryan Bartle), small abdominal aortic aneurysm noted in 2015 (3cm), H/O colon cancer, in remission since 1992, GERD, hyperlipidemia, obstructive sleep apnea not using CPAP.   Patient hospitalized 03/2021 with recurrence of atrial flutter and echocardiogram revealed new onset LVEF 35-40% with global hypokinesis. At last visit switch patient to metoprolol tartrate 50 mg BID, started Eliquis 5 mg BID.   Patient now presents for 4 week follow up, he has now been on anticoagulation for 4 weeks.  Notably patient is also undergoing treatment for bronchitis by PCP with doxycycline. He continues to complain of fatigue, palpitations, and dyspnea on exertion.  Denies chest pain, syncope, near syncope, dizziness, orthopnea, PND, leg swelling.  Denies symptoms suggestive of TIA/CVA.  He has not followed up regarding CPAP titration.  He is tolerating anticoagulation without bleeding diathesis.  Past Medical History:  Diagnosis Date  . Allergy   . Arthritis   . BPH (benign prostatic hyperplasia)   . CAD (coronary artery disease)    s/p CABG 2007; NSTEMI in setting of AFib with RVR 12/2009; cath 1/11: S-RCA ok with prox 30-40% and 50% stenoses; S-OM and RI  with patent OM limb but an occluded RI limb; S-D2 and L-LAD ok; EF 55%  . Cataract    removed both eyes   . Clotting disorder (HCC)    s/p CABG x 5- PE   . Colon cancer ([place]) 1992  . COPD (chronic obstructive pulmonary disease) ([place])   . Diverticulosis   . Essential  hypertension   . Family history of breast cancer   . Family history of colon cancer   . Family history of lung cancer   . GERD (gastroesophageal reflux disease)   . History of pulmonary embolus (PE) 2007   Following CABG  . Hyperlipidemia   . 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 OSA on CPAP   . Paroxysmal atrial fibrillation (HCC)    Previously on Multaq, declines anticoagulation  . Personal history of colonic polyps 01/22/2000   Tubular adenoma  . RBBB (right bundle branch block)   . Sleep apnea    no cpap   . 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   in situ-left forearm   Past Surgical History:  Procedure Laterality Date  . COLECTOMY  1992   w/ colostomy  related to colon cancer  . COLON SURGERY  1993   to take down Colostomy  . COLONOSCOPY    . CORONARY ARTERY BYPASS GRAFT  2007  . CORONARY STENT INTERVENTION N/A 10/07/2018   Procedure: CORONARY STENT INTERVENTION;  Surgeon: Adrian Prows, MD;  Location: [facility];  Service: Cardiovascular;  Laterality: N/A;  . HAND [place]   tendon repair and nerve; left  . INGUINAL HERNIA REPAIR  2009  . LAPAROSCOPIC CHOLECYSTECTOMY  2004  . POLYPECTOMY    . RIGHT/LEFT  HEART CATH AND CORONARY/GRAFT ANGIOGRAPHY N/A 07/03/2017   Procedure: Right/Left Heart Cath and Coronary/Graft Angiography;  Surgeon: Leonie Man, MD;  Location: [facility];  Service: Cardiovascular;  Laterality: N/A;  . RIGHT/LEFT HEART CATH AND CORONARY/GRAFT ANGIOGRAPHY N/A 10/07/2018   Procedure: RIGHT/LEFT HEART CATH AND CORONARY/GRAFT ANGIOGRAPHY;  Surgeon: Adrian Prows, MD;  Location: [facility];  Service: Cardiovascular;  Laterality: N/A;  . UMBILICAL HERNIA REPAIR    . VENTRAL HERNIA REPAIR  2009   Family History  Problem Relation Age of Onset  . Lung cancer Mother   . Heart disease Mother   . Emphysema Father   . Heart  disease Father   . Cancer Sister 72       unknown type  . Breast cancer Sister        dx. >50  . Colon cancer Sister        dx. >50  . Breast cancer Sister        dx. >50  . Colon cancer Sister        dx. >50  . Colon cancer Cousin        dx. early 21s  . Esophageal cancer Neg Hx   . Rectal cancer Neg Hx   . Stomach cancer Neg Hx   . Colon polyps Neg Hx    Social History   Tobacco Use  . Smoking status: Former Smoker    Packs/day: 1.00    Years: 45.00    Pack years: 45.00    Types: Cigarettes    Quit date: 12/18/1995    Years since quitting: 25.4  . Smokeless tobacco: Never Used  Substance Use Topics  . Alcohol use: No    Comment: quit in 1980   Marital Status: Married   Review of Systems  Constitutional: Positive for malaise/fatigue. Negative for decreased appetite, weight gain and weight loss.  Eyes: Negative for visual disturbance.  Cardiovascular: Positive for dyspnea on exertion and palpitations. Negative for chest pain, claudication, leg swelling, near-syncope, orthopnea, paroxysmal nocturnal dyspnea and syncope.  Respiratory: Negative for hemoptysis, shortness of breath and wheezing.   Endocrine: Negative for cold intolerance and heat intolerance.  Hematologic/Lymphatic: Does not bruise/bleed easily.  Skin: Negative for nail changes.  Musculoskeletal: Positive for back pain and joint pain (knee). Negative for muscle weakness and myalgias.  Gastrointestinal: Negative for abdominal pain, change in bowel habit, melena, nausea and vomiting.  Neurological: Negative for difficulty with concentration, dizziness, focal weakness, headaches and weakness.  Psychiatric/Behavioral: Negative for altered mental status and suicidal ideas.  All other systems reviewed and are negative.  Objective:  There were no vitals taken for this visit. There is no height or weight on file to calculate BMI.  Vitals with BMI 05/02/2021 04/19/2021 03/30/2021  Height 5\' 11"  5\' 11"  5\' 11"   Weight  247 lbs 6 oz 244 lbs 13 oz 238 lbs  BMI 34.52 88.91 69.45  Systolic 038 882 800  Diastolic 66 72 74  Pulse 94 73 76    Physical Exam Vitals reviewed.  Constitutional:      Appearance: He is well-developed.  HENT:     Head: Normocephalic and atraumatic.  Cardiovascular:     Rate and Rhythm: Tachycardia present. Rhythm irregularly irregular.     Pulses: Intact distal pulses.          Femoral pulses are 2+ on the right side and 2+ on the left side.      Popliteal pulses are 1+ on the  right side and 1+ on the left side.       Dorsalis pedis pulses are 1+ on the right side and 1+ on the left side.       Posterior tibial pulses are 2+ on the right side and 2+ on the left side.     Heart sounds: Normal heart sounds, S1 normal and S2 normal. No murmur heard. No gallop.   Pulmonary:     Effort: Pulmonary effort is normal. No accessory muscle usage or respiratory distress.     Breath sounds: Normal breath sounds. No wheezing, rhonchi or rales.  Musculoskeletal:        General: Normal range of motion.     Cervical back: Normal range of motion.     Right lower leg: No edema.     Left lower leg: No edema.  Skin:    General: Skin is warm and dry.  Neurological:     Mental Status: He is alert and oriented to person, place, and time.     Radiology    Chest x-ray 03/25/2021: 1. Low volume film with cardiomegaly and vascular congestion. 2. Patchy airspace disease in the left base compatible with atelectasis or pneumonia. 3. Tiny nodular density peripheral right mid lung is stable, suggesting benign etiology.  CTA of chest 09/16/2019:  4.4 cm ascending thoracic aortic aneurysm. Recommend annual imaging followup by CTA or MRA. This recommendation follows 2010 ACCF/AHA/AATS/ACR/ASA/SCA/SCAI/SIR/STS/SVM Guidelines for the Diagnosis and Management of Patients with Thoracic Aortic Disease. Circulation. 2010; 121: S923-R007. Aortic aneurysm NOS (ICD10-I71.9)  Old granulomatous disease. No  acute cardiopulmonary disease. Aortic Atherosclerosis (ICD10-I70.0).  Laboratory examination:   CMP Latest Ref Rng & Units 03/26/2021 03/25/2021 10/08/2018  Glucose 70 - 99 mg/dL 93 125(H) 111(H)  BUN 8 - 23 mg/dL 20 19 10   Creatinine 0.61 - 1.24 mg/dL 1.08 1.20 1.21  Sodium 135 - 145 mmol/L 134(L) 137 139  Potassium 3.5 - 5.1 mmol/L 3.4(L) 4.0 3.8  Chloride 98 - 111 mmol/L 100 103 106  CO2 22 - 32 mmol/L 25 23 28   Calcium 8.9 - 10.3 mg/dL 8.5(L) 9.2 8.9  Total Protein 6.5 - 8.1 g/dL - 7.2 -  Total Bilirubin 0.3 - 1.2 mg/dL - 0.5 -  Alkaline Phos 38 - 126 U/L - 54 -  AST 15 - 41 U/L - 28 -  ALT 0 - 44 U/L - 34 -   CBC Latest Ref Rng & Units 03/26/2021 03/25/2021 10/08/2018  WBC 4.0 - 10.5 K/uL 9.2 9.8 6.5  Hemoglobin 13.0 - 17.0 g/dL 14.3 14.5 13.3  Hematocrit 39.0 - 52.0 % 43.9 44.3 43.0  Platelets 150 - 400 K/uL 221 235 174   Lipid Panel     Component Value Date/Time   CHOL 134 09/08/2015 1210   TRIG 211.0 (H) 09/08/2015 1210   HDL 36.10 (L) 09/08/2015 1210   CHOLHDL 4 09/08/2015 1210   VLDL 42.2 (H) 09/08/2015 1210   LDLCALC 59 07/23/2014 1638   LDLDIRECT 87.0 09/08/2015 1210   HEMOGLOBIN A1C No results found for: HGBA1C, MPG TSH Recent Labs    03/25/21 1443  TSH 0.630    External Labs:  Cholesterol, total 128.000 m 06/22/2020 HDL 34.000 mg 06/22/2020 LDL-C 63.000 mg 06/22/2020 Triglycerides 183.000 m 06/22/2020  Hemoglobin 14.900 g/d 06/22/2020  Creatinine, Serum 1.250 mg/ 06/22/2020 Potassium 3.900 mm 06/22/2020 ALT (SGPT) 30.000 U/L 06/22/2020  TSH 2.120 06/22/2020   Medications and Allergies   Allergies  Allergen Reactions  . Morphine Sulfate Itching  . Rosuvastatin  Calcium Other (See Comments)    Muscle weakness  . Ultram [Tramadol] Itching    No current facility-administered medications on file prior to encounter.   Current Outpatient Medications on File Prior to Encounter  Medication Sig Dispense Refill  . acetaminophen (TYLENOL) 325 MG tablet Take 2  tablets (650 mg total) by mouth every 6 (six) hours as needed for mild pain (or Fever >/= 101). 12 tablet 0  . albuterol (PROVENTIL HFA;VENTOLIN HFA) 108 (90 Base) MCG/ACT inhaler Inhale 2 puffs into the lungs every 4 (four) hours as needed for wheezing or shortness of breath. 1 Inhaler 5  . albuterol (PROVENTIL) (2.5 MG/3ML) 0.083% nebulizer solution USE 1 VIAL IN NEBULIZER EVERY 6 HOURS AS NEEDED FOR WHEEZING OR SHORTNESS OF BREATH (Patient taking differently: Take 2.5 mg by nebulization every 6 (six) hours as needed for wheezing or shortness of breath.) 75 mL 3  . apixaban (ELIQUIS) 5 MG TABS tablet Take 1 tablet (5 mg total) by mouth 2 (two) times daily. 60 tablet 3  . aspirin EC 81 MG tablet Take 81 mg by mouth in the morning. Swallow whole.    Marland Kitchen atorvastatin (LIPITOR) 20 MG tablet Take 20 mg by mouth at bedtime.   5  . clonazePAM (KLONOPIN) 1 MG tablet Take 1 mg by mouth at bedtime as needed (sleep).    . fluticasone (FLONASE) 50 MCG/ACT nasal spray Place 2 sprays into both nostrils daily. (Patient taking differently: Place 2 sprays into both nostrils every evening.) 16 g 2  . gabapentin (NEURONTIN) 100 MG capsule Take 100-200 mg by mouth See admin instructions. 2am & 1qhs    . guaiFENesin (MUCINEX) 600 MG 12 hr tablet Take 600 mg by mouth in the morning and at bedtime.    Marland Kitchen HYDROcodone-acetaminophen (NORCO/VICODIN) 5-325 MG tablet Take 1 tablet by mouth every 6 (six) hours as needed for moderate pain.    . isosorbide mononitrate (IMDUR) 30 MG 24 hr tablet Take 1 tablet by mouth once daily (Patient taking differently: Take 30 mg by mouth in the morning.) 90 tablet 1  . Magnesium 200 MG TABS Take 200 mg by mouth in the morning.    . metoprolol tartrate (LOPRESSOR) 50 MG tablet Take 1 tablet (50 mg total) by mouth 2 (two) times daily. 180 tablet 3  . Multiple Vitamin (MULTIVITAMIN WITH MINERALS) TABS tablet Take 1 tablet by mouth in the morning.    . nitroGLYCERIN (NITROSTAT) 0.4 MG SL tablet  Place 1 tablet (0.4 mg total) under the tongue every 5 (five) minutes as needed for chest pain. (Patient taking differently: Place 0.4 mg under the tongue every 5 (five) minutes x 3 doses as needed for chest pain.) 25 tablet 3  . Omega-3 Fatty Acids (FISH OIL) 1000 MG CAPS Take 1,000 mg by mouth in the morning.    Marland Kitchen omeprazole (PRILOSEC) 20 MG capsule Take 1 capsule (20 mg total) by mouth daily. (Patient taking differently: Take 20 mg by mouth every other day. In the morning) 30 capsule 3  . Phenylephrine-DM-GG (ROBITUSSIN COUGH/COLD CF MAX PO) Take 20 mLs by mouth every 6 (six) hours as needed (cough).    . Potassium Chloride ER 20 MEQ TBCR Take 20 mEq by mouth in the morning.    Marland Kitchen STIOLTO RESPIMAT 2.5-2.5 MCG/ACT AERS INHALE 2 PUFFS BY MOUTH ONCE DAILY (Patient taking differently: Inhale 2 puffs into the lungs every evening.) 4 g 5  . tamsulosin (FLOMAX) 0.4 MG CAPS capsule Take 0.8 mg by mouth at  bedtime.    Marland Kitchen tiZANidine (ZANAFLEX) 4 MG tablet Take 4 mg by mouth at bedtime as needed for muscle spasms.    Marland Kitchen torsemide (DEMADEX) 20 MG tablet Take 40 mg by mouth in the morning.    Marland Kitchen doxycycline (VIBRA-TABS) 100 MG tablet Take 1 tablet (100 mg total) by mouth 2 (two) times daily. (Patient not taking: Reported on 05/17/2021) 14 tablet 0     Cardiac Studies:   CABG 2007: LIMA to LAD, SVG to D2, SVG to OM1 and ramus intermediate with OM1 limb occluded, SVG to RCA with a proximal 50% stenosis by coronary angiography in July 2018. Normal LVEF.  Lexiscan sestamibi stress test 12/03/2016: No significant ST-T wave changes during stress test. Medium defect of moderate intensity in the basal inferior, mid inferoseptal, mid inferior and mid inferolateral and apical inferior and apical lateral location consistent with Myocardial infarction mild degree of peri-infarct ischemia. LVEF 52%. Intermediate risk study.  Echocardiogram 09/26/2018: Left ventricle cavity is normal in size. Moderate concentric hypertrophy of  the left ventricle. Abnormal septal wall motion due to post-operative coronary artery bypass graft. Doppler evidence of grade I (impaired) diastolic dysfunction, normal LAP. Calculated EF 56%. Left atrial cavity is normal in size. The interatrial Septum is thin and mobile but appears to be intact by 2D and CF Doppler interrogation. The aortic root is mildly dilated at 4.11 cm. Compared to Echocardiogram 03/19/2017: Normal LV systolic function, severe LVH, EF 01-09%, grade 1 diastolic dysfunction. Right ventricle mildly dilated.  Coronary angiogram 10/08/2018 and had a very high-grade stenosis of a large hiatal D1/ramus branch S/P 3.0 x 23 mm Sierra Xience DES. Patent LIMA to LAD, SVG to D2 and SVG to RCA widely patent. Normal right heart catheterization, preserved cardiac output and cardiac index.  Echocardiogram 03/26/2021: 1. Left ventricular ejection fraction, by estimation, is 35 to 40%. The  left ventricle has moderately decreased function. The left ventricle  demonstrates global hypokinesis. There is mild left ventricular  hypertrophy. Left ventricular diastolic  parameters are indeterminate.  2. Right ventricular systolic function is normal. The right ventricular  size is normal.  3. Left atrial size was mildly dilated.  4. The mitral valve is normal in structure. No evidence of mitral valve  regurgitation. No evidence of mitral stenosis.  5. The aortic valve is tricuspid. There is mild calcification of the  aortic valve. Aortic valve regurgitation is trivial. Mild aortic valve  sclerosis is present, with no evidence of aortic valve stenosis.  6. The inferior vena cava is normal in size with greater than 50%  respiratory variability, suggesting right atrial pressure of 3 mmHg.   EKG:   05/03/2021: Atrial fibrillation with rapid ventricular response at a rate of 103 bpm.  Normal axis.  Right bundle branch block, secondary ST-T wave changes.  Compared to EKG 03/30/2021, ventricular  rate controlled improved otherwise no significant change.  ED EKG 03/25/2021: Atrial flutter with 4-1 AV block at a rate of 128 bpm.  10/12/2020: Sinus rhythm with  first-degree AV block at rate of 82 bpm, normal axis, right bundle branch block.   Assessment:   No diagnosis found.  No orders of the defined types were placed in this encounter.  Medications Discontinued During This Encounter  Medication Reason  . Omega-3 Fatty Acids (FISH OIL) 1200 MG CAPS Dose change  . ketoconazole (NIZORAL) 2 % cream No longer needed (for PRN medications)    This patients CHA2DS2-VASc Score 5 (CHF, HTN, vasc, age) and yearly risk  of stroke 7.2%.   Recommendations:   JANARD CULP  is a 80 y.o. with known coronary artery disease and CABG in 2007 in the setting of non-STEMI in atrial fibrillation with RVR, no known recurrence since then. He has a history of  45 pack year h/o smoking tobacco, ascending aortic aneurysm (follows Dr.Bryan Bartle), small abdominal aortic aneurysm noted in 2015 (3cm), H/O colon cancer, in remission since 1992, GERD, hyperlipidemia, obstructive sleep apnea not using CPAP.  Patient hospitalized 03/2021 with recurrence of atrial flutter and echocardiogram revealed new onset LVEF 35-40% with global hypokinesis. At last visit switch patient to metoprolol tartrate 50 mg BID, started Eliquis 5 mg BID.   Patient now presents for 4 week follow up, he has now been on anticoagulation for 4 weeks.  Patient continues to have symptoms likely related to underlying atrial fibrillation as EKG reveals A. fib with rapid ventricular response at a rate of 103 bpm today.  Suspect patient's fatigue and dyspnea to be related to atrial fibrillation.  As patient has now been on anticoagulation for >4 weeks recommend proceeding with cardioversion.  The direct-current cardioversion procedure was explained to the patient in detail. The indication, alternatives, risks and benefits were reviewed. Complications  including but not limited to heart rhythm disturbances, cerebrovascular accident, and rarely death were reviewed and discussed with the patient. The patient voices understanding and wishes to proceed.   Patient is tolerating present medications, therefore will not make changes at this time.  Rate control has improved with increased dose of metoprolol.  Although blood pressure remains soft, symptoms are likely related to underlying atrial fibrillation rather than hypotension, will continue to monitor closely particularly following cardioversion.  In view of patient's reduced LVEF in the setting of atrial fibrillation/flutter will likely pursue rhythm management strategy as well.  We will also consider repeat ischemic evaluation when patient is more stable from atrial fibrillation standpoint.   Follow-up in approximately 4 weeks following cardioversion.    Esther Hardy, PA-C 05/23/2021, 12:28 PM Office: [phone]

## 2021-05-23 NOTE — Progress Notes (Signed)
  Echocardiogram Echocardiogram Transesophageal has been performed.  [name] 05/23/2021, 2:27 PM

## 2021-05-24 ENCOUNTER — Encounter (HOSPITAL_COMMUNITY): Payer: Self-pay | Admitting: Cardiology

## 2021-05-24 LAB — POCT I-STAT, CHEM 8
BUN: 11 mg/dL (ref 8–23)
Calcium, Ion: 1.23 mmol/L (ref 1.15–1.40)
Chloride: 104 mmol/L (ref 98–111)
Creatinine, Ser: 1.2 mg/dL (ref 0.61–1.24)
Glucose, Bld: 90 mg/dL (ref 70–99)
HCT: 38 % — ABNORMAL LOW (ref 39.0–52.0)
Hemoglobin: 12.9 g/dL — ABNORMAL LOW (ref 13.0–17.0)
Potassium: 4.1 mmol/L (ref 3.5–5.1)
Sodium: 140 mmol/L (ref 135–145)
TCO2: 26 mmol/L (ref 22–32)

## 2021-05-24 NOTE — Anesthesia Postprocedure Evaluation (Signed)
Anesthesia Post Note  Patient: [name]  Procedure(s) Performed: CARDIOVERSION (N/A ) TRANSESOPHAGEAL ECHOCARDIOGRAM (TEE)     Patient location during evaluation: PACU Anesthesia Type: General Level of consciousness: awake and alert Pain management: pain level controlled Vital Signs Assessment: post-procedure vital signs reviewed and stable Respiratory status: spontaneous breathing, nonlabored ventilation, respiratory function stable and patient connected to nasal cannula oxygen Cardiovascular status: blood pressure returned to baseline and stable Postop Assessment: no apparent nausea or vomiting Anesthetic complications: no   No complications documented.  Last Vitals:  Vitals:   05/23/21 1420 05/23/21 1430  BP: 110/65 115/80  Pulse: 77 83  Resp: 19 15  Temp:    SpO2: 98% 96%    Last Pain:  Vitals:   05/24/21 1513  TempSrc:   PainSc: 0-No pain   Pain Goal:                   Tiajuana Amass

## 2021-05-30 NOTE — Progress Notes (Signed)
Primary Physician:  Shirline Frees, MD   Patient ID: [name], male    DOB: 1941-07-03, 80 y.o.   MRN: [id number]  Subjective:    Chief Complaint  Patient presents with   Atrial Fibrillation   Hospitalization Follow-up   Follow-up    HPI: ZEN CEDILLOS  is a 80 y.o. male  with known coronary artery disease and CABG in 2007 in the setting of non-STEMI in atrial fibrillation with RVR. He has a history of  45 pack year h/o smoking tobacco, ascending aortic aneurysm (follows Dr.Bryan Bartle), small abdominal aortic aneurysm noted in 2015 (3cm), H/O colon cancer, in remission since 1992, GERD, hyperlipidemia, obstructive sleep apnea now on CPAP.   Patient hospitalized 03/2021 with recurrence of atrial flutter and echocardiogram revealed new onset LVEF 35-40% with global hypokinesis.  Patient now presents for 4-week follow-up of atrial fibrillation after cardioversion.  Patient underwent successful cardioversion 05/23/2021 with return to normal sinus rhythm.  TEE prior to cardioversion noted secundum ASD and PFO.   Patient reports he is feeling much improved since undergoing cardioversion.  Reports significant improvement in dyspnea and fatigue.  He does continue to have mild dyspnea on exertion.  Denies chest pain, palpitations, syncope, near syncope, dizziness, orthopnea, PND, leg swelling.  Denies symptoms suggestive of TIA/CVA.  Patient is tolerating anticoagulation without bleeding diathesis.  Notably he has not yet followed up regarding CPAP titration, but plans to in the near future.  Past Medical History:  Diagnosis Date   Allergy    Arthritis    BPH (benign prostatic hyperplasia)    CAD (coronary artery disease)    s/p CABG 2007; NSTEMI in setting of AFib with RVR 12/2009; cath 1/11: S-RCA ok with prox 30-40% and 50% stenoses; S-OM and RI  with patent OM limb but an occluded RI limb; S-D2 and L-LAD ok; EF 55%   Cataract    removed both eyes    Clotting disorder (HCC)    s/p  CABG x 5- PE    Colon cancer ([place]) 1992   COPD (chronic obstructive pulmonary disease) ([place])    Diverticulosis    Essential hypertension    Family history of breast cancer    Family history of colon cancer    Family history of lung cancer    GERD (gastroesophageal reflux disease)    History of pulmonary embolus (PE) 2007   Following CABG   Hyperlipidemia    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[place])    Right upper   OSA on CPAP    Paroxysmal atrial fibrillation ([place])    Previously on Multaq, declines anticoagulation   Personal history of colonic polyps 01/22/2000   Tubular adenoma   RBBB (right bundle branch block)    Sleep apnea    no cpap    Squamous cell carcinoma of skin 02/11/2008   [place]-Left paraspinal, lower    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   in situ-left forearm   Past Surgical History:  Procedure Laterality Date   CARDIOVERSION N/A 05/23/2021   Procedure: CARDIOVERSION;  Surgeon: Nigel Mormon, MD;  Location: [facility];  Service: Cardiovascular;  Laterality: N/A;   COLECTOMY  1992   w/ colostomy  related to colon cancer   [place]   to take down Colostomy   COLONOSCOPY     CORONARY ARTERY BYPASS GRAFT  2007   CORONARY STENT INTERVENTION N/A 10/07/2018   Procedure: CORONARY STENT INTERVENTION;  Surgeon: Adrian Prows, MD;  Location: [facility];  Service: Cardiovascular;  Laterality: N/A;   HAND [facility]   tendon repair and nerve; left   INGUINAL HERNIA REPAIR  2009   LAPAROSCOPIC CHOLECYSTECTOMY  2004   POLYPECTOMY     RIGHT/LEFT HEART CATH AND CORONARY/GRAFT ANGIOGRAPHY N/A 07/03/2017   Procedure: Right/Left Heart Cath and Coronary/Graft Angiography;  Surgeon: Leonie Man, MD;  Location: [facility];  Service: Cardiovascular;  Laterality: N/A;   RIGHT/LEFT HEART CATH AND CORONARY/GRAFT ANGIOGRAPHY N/A 10/07/2018   Procedure: RIGHT/LEFT HEART CATH AND CORONARY/GRAFT ANGIOGRAPHY;  Surgeon: Adrian Prows, MD;   Location: [facility];  Service: Cardiovascular;  Laterality: N/A;   TEE WITHOUT CARDIOVERSION  05/23/2021   Procedure: TRANSESOPHAGEAL ECHOCARDIOGRAM (TEE);  Surgeon: Nigel Mormon, MD;  Location: [facility];  Service: Cardiovascular;;   UMBILICAL HERNIA REPAIR     VENTRAL HERNIA REPAIR  2009   Family History  Problem Relation Age of Onset   Lung cancer Mother    Heart disease Mother    Emphysema Father    Heart disease Father    Cancer Sister 67       unknown type   Breast cancer Sister        dx. >50   Colon cancer Sister        dx. >50   Breast cancer Sister        dx. >50   Colon cancer Sister        dx. >50   Colon cancer Cousin        dx. early 74s   Esophageal cancer Neg Hx    Rectal cancer Neg Hx    Stomach cancer Neg Hx    Colon polyps Neg Hx    Social History   Tobacco Use   Smoking status: Former    Packs/day: 1.00    Years: 45.00    Pack years: 45.00    Types: Cigarettes    Quit date: 12/18/1995    Years since quitting: 25.4   Smokeless tobacco: Never  Substance Use Topics   Alcohol use: No    Comment: quit in 1980   Marital Status: Married   ROS   Review of Systems  Constitutional: Negative for malaise/fatigue (improved) and weight gain.  Cardiovascular:  Positive for dyspnea on exertion (mild, improved). Negative for chest pain, claudication, leg swelling, near-syncope, orthopnea, palpitations, paroxysmal nocturnal dyspnea and syncope.  Musculoskeletal:  Positive for back pain and joint pain (knee).  Neurological:  Negative for dizziness.  Objective:  Blood pressure 122/77, pulse 85, temperature 98.2 F (36.8 C), height 5\' 11"  (1.803 m), weight 240 lb (108.9 kg), SpO2 94 %. Body mass index is 33.47 kg/m.  Vitals with BMI 05/31/2021 05/23/2021 05/23/2021  Height 5\' 11"  - -  Weight 240 lbs - -  BMI 29.79 - -  Systolic 892 119 417  Diastolic 77 80 65  Pulse 85 83 77    Physical Exam Vitals reviewed.  Constitutional:       Appearance: He is well-developed.  HENT:     Head: Normocephalic and atraumatic.  Cardiovascular:     Rate and Rhythm: Normal rate and regular rhythm.     Pulses: Intact distal pulses.          Femoral pulses are 2+ on the right side and 2+ on the left side.      Popliteal pulses are 1+ on the right side and 1+ on the left side.  Dorsalis pedis pulses are 1+ on the right side and 1+ on the left side.       Posterior tibial pulses are 2+ on the right side and 2+ on the left side.     Heart sounds: Normal heart sounds, S1 normal and S2 normal. No murmur heard.   No gallop.  Pulmonary:     Effort: Pulmonary effort is normal. No accessory muscle usage or respiratory distress.     Breath sounds: Normal breath sounds. No wheezing, rhonchi or rales.  Musculoskeletal:        General: Normal range of motion.     Cervical back: Normal range of motion.     Right lower leg: No edema.     Left lower leg: No edema.  Skin:    General: Skin is warm and dry.  Neurological:     Mental Status: He is alert and oriented to person, place, and time.   Laboratory examination:   CMP Latest Ref Rng & Units 05/23/2021 03/26/2021 03/25/2021  Glucose 70 - 99 mg/dL 90 93 125(H)  BUN 8 - 23 mg/dL 11 20 19   Creatinine 0.61 - 1.24 mg/dL 1.20 1.08 1.20  Sodium 135 - 145 mmol/L 140 134(L) 137  Potassium 3.5 - 5.1 mmol/L 4.1 3.4(L) 4.0  Chloride 98 - 111 mmol/L 104 100 103  CO2 22 - 32 mmol/L - 25 23  Calcium 8.9 - 10.3 mg/dL - 8.5(L) 9.2  Total Protein 6.5 - 8.1 g/dL - - 7.2  Total Bilirubin 0.3 - 1.2 mg/dL - - 0.5  Alkaline Phos 38 - 126 U/L - - 54  AST 15 - 41 U/L - - 28  ALT 0 - 44 U/L - - 34   CBC Latest Ref Rng & Units 05/23/2021 03/26/2021 03/25/2021  WBC 4.0 - 10.5 K/uL - 9.2 9.8  Hemoglobin 13.0 - 17.0 g/dL 12.9(L) 14.3 14.5  Hematocrit 39.0 - 52.0 % 38.0(L) 43.9 44.3  Platelets 150 - 400 K/uL - 221 235   Lipid Panel     Component Value Date/Time   CHOL 134 09/08/2015 1210   TRIG 211.0 (H)  09/08/2015 1210   HDL 36.10 (L) 09/08/2015 1210   CHOLHDL 4 09/08/2015 1210   VLDL 42.2 (H) 09/08/2015 1210   LDLCALC 59 07/23/2014 1638   LDLDIRECT 87.0 09/08/2015 1210   HEMOGLOBIN A1C No results found for: HGBA1C, MPG TSH Recent Labs    03/25/21 1443  TSH 0.630    External Labs:  Cholesterol, total 128.000 m 06/22/2020 HDL 34.000 mg 06/22/2020 LDL-C 63.000 mg 06/22/2020 Triglycerides 183.000 m 06/22/2020  Hemoglobin 14.900 g/d 06/22/2020  Creatinine, Serum 1.250 mg/ 06/22/2020 Potassium 3.900 mm 06/22/2020 ALT (SGPT) 30.000 U/L 06/22/2020  TSH 2.120 06/22/2020  Allergies   Allergies  Allergen Reactions   Morphine Sulfate Itching   Rosuvastatin Calcium Other (See Comments)    Muscle weakness   Ultram [Tramadol] Itching      Medications Prior to Visit:   Outpatient Medications Prior to Visit  Medication Sig Dispense Refill   acetaminophen (TYLENOL) 325 MG tablet Take 2 tablets (650 mg total) by mouth every 6 (six) hours as needed for mild pain (or Fever >/= 101). 12 tablet 0   albuterol (PROVENTIL HFA;VENTOLIN HFA) 108 (90 Base) MCG/ACT inhaler Inhale 2 puffs into the lungs every 4 (four) hours as needed for wheezing or shortness of breath. 1 Inhaler 5   albuterol (PROVENTIL) (2.5 MG/3ML) 0.083% nebulizer solution USE 1 VIAL IN NEBULIZER EVERY 6 HOURS AS NEEDED  FOR WHEEZING OR SHORTNESS OF BREATH (Patient taking differently: Take 2.5 mg by nebulization every 6 (six) hours as needed for wheezing or shortness of breath.) 75 mL 3   apixaban (ELIQUIS) 5 MG TABS tablet Take 1 tablet (5 mg total) by mouth 2 (two) times daily. 60 tablet 3   atorvastatin (LIPITOR) 20 MG tablet Take 20 mg by mouth at bedtime.   5   clonazePAM (KLONOPIN) 1 MG tablet Take 1 mg by mouth at bedtime as needed (sleep).     fluticasone (FLONASE) 50 MCG/ACT nasal spray Place 2 sprays into both nostrils daily. (Patient taking differently: Place 2 sprays into both nostrils every evening.) 16 g 2   gabapentin  (NEURONTIN) 100 MG capsule Take 100-200 mg by mouth See admin instructions. 2am & 1qhs     guaiFENesin (MUCINEX) 600 MG 12 hr tablet Take 600 mg by mouth in the morning and at bedtime.     HYDROcodone-acetaminophen (NORCO/VICODIN) 5-325 MG tablet Take 1 tablet by mouth every 6 (six) hours as needed for moderate pain.     isosorbide mononitrate (IMDUR) 30 MG 24 hr tablet Take 1 tablet by mouth once daily (Patient taking differently: Take 30 mg by mouth in the morning.) 90 tablet 1   Magnesium 200 MG TABS Take 200 mg by mouth in the morning.     metoprolol tartrate (LOPRESSOR) 50 MG tablet Take 1 tablet (50 mg total) by mouth 2 (two) times daily. 180 tablet 3   Multiple Vitamin (MULTIVITAMIN WITH MINERALS) TABS tablet Take 1 tablet by mouth in the morning.     nitroGLYCERIN (NITROSTAT) 0.4 MG SL tablet Place 1 tablet (0.4 mg total) under the tongue every 5 (five) minutes as needed for chest pain. (Patient taking differently: Place 0.4 mg under the tongue every 5 (five) minutes x 3 doses as needed for chest pain.) 25 tablet 3   Omega-3 Fatty Acids (FISH OIL) 1000 MG CAPS Take 1,000 mg by mouth in the morning.     omeprazole (PRILOSEC) 20 MG capsule Take 1 capsule (20 mg total) by mouth daily. (Patient taking differently: Take 20 mg by mouth every other day. In the morning) 30 capsule 3   Phenylephrine-DM-GG (ROBITUSSIN COUGH/COLD CF MAX PO) Take 20 mLs by mouth every 6 (six) hours as needed (cough).     Potassium Chloride ER 20 MEQ TBCR Take 20 mEq by mouth in the morning.     STIOLTO RESPIMAT 2.5-2.5 MCG/ACT AERS INHALE 2 PUFFS BY MOUTH ONCE DAILY (Patient taking differently: Inhale 2 puffs into the lungs every evening.) 4 g 5   tamsulosin (FLOMAX) 0.4 MG CAPS capsule Take 0.8 mg by mouth at bedtime.     tiZANidine (ZANAFLEX) 4 MG tablet Take 4 mg by mouth at bedtime as needed for muscle spasms.     torsemide (DEMADEX) 20 MG tablet Take 40 mg by mouth in the morning.     aspirin EC 81 MG tablet Take 81  mg by mouth in the morning. Swallow whole.     No facility-administered medications prior to visit.     Final Medications at End of Visit    Current Meds  Medication Sig   acetaminophen (TYLENOL) 325 MG tablet Take 2 tablets (650 mg total) by mouth every 6 (six) hours as needed for mild pain (or Fever >/= 101).   albuterol (PROVENTIL HFA;VENTOLIN HFA) 108 (90 Base) MCG/ACT inhaler Inhale 2 puffs into the lungs every 4 (four) hours as needed for wheezing or shortness of  breath.   albuterol (PROVENTIL) (2.5 MG/3ML) 0.083% nebulizer solution USE 1 VIAL IN NEBULIZER EVERY 6 HOURS AS NEEDED FOR WHEEZING OR SHORTNESS OF BREATH (Patient taking differently: Take 2.5 mg by nebulization every 6 (six) hours as needed for wheezing or shortness of breath.)   apixaban (ELIQUIS) 5 MG TABS tablet Take 1 tablet (5 mg total) by mouth 2 (two) times daily.   atorvastatin (LIPITOR) 20 MG tablet Take 20 mg by mouth at bedtime.    clonazePAM (KLONOPIN) 1 MG tablet Take 1 mg by mouth at bedtime as needed (sleep).   fluticasone (FLONASE) 50 MCG/ACT nasal spray Place 2 sprays into both nostrils daily. (Patient taking differently: Place 2 sprays into both nostrils every evening.)   gabapentin (NEURONTIN) 100 MG capsule Take 100-200 mg by mouth See admin instructions. 2am & 1qhs   guaiFENesin (MUCINEX) 600 MG 12 hr tablet Take 600 mg by mouth in the morning and at bedtime.   HYDROcodone-acetaminophen (NORCO/VICODIN) 5-325 MG tablet Take 1 tablet by mouth every 6 (six) hours as needed for moderate pain.   isosorbide mononitrate (IMDUR) 30 MG 24 hr tablet Take 1 tablet by mouth once daily (Patient taking differently: Take 30 mg by mouth in the morning.)   Magnesium 200 MG TABS Take 200 mg by mouth in the morning.   metoprolol tartrate (LOPRESSOR) 50 MG tablet Take 1 tablet (50 mg total) by mouth 2 (two) times daily.   Multiple Vitamin (MULTIVITAMIN WITH MINERALS) TABS tablet Take 1 tablet by mouth in the morning.    nitroGLYCERIN (NITROSTAT) 0.4 MG SL tablet Place 1 tablet (0.4 mg total) under the tongue every 5 (five) minutes as needed for chest pain. (Patient taking differently: Place 0.4 mg under the tongue every 5 (five) minutes x 3 doses as needed for chest pain.)   Omega-3 Fatty Acids (FISH OIL) 1000 MG CAPS Take 1,000 mg by mouth in the morning.   omeprazole (PRILOSEC) 20 MG capsule Take 1 capsule (20 mg total) by mouth daily. (Patient taking differently: Take 20 mg by mouth every other day. In the morning)   Phenylephrine-DM-GG (ROBITUSSIN COUGH/COLD CF MAX PO) Take 20 mLs by mouth every 6 (six) hours as needed (cough).   Potassium Chloride ER 20 MEQ TBCR Take 20 mEq by mouth in the morning.   STIOLTO RESPIMAT 2.5-2.5 MCG/ACT AERS INHALE 2 PUFFS BY MOUTH ONCE DAILY (Patient taking differently: Inhale 2 puffs into the lungs every evening.)   tamsulosin (FLOMAX) 0.4 MG CAPS capsule Take 0.8 mg by mouth at bedtime.   tiZANidine (ZANAFLEX) 4 MG tablet Take 4 mg by mouth at bedtime as needed for muscle spasms.   torsemide (DEMADEX) 20 MG tablet Take 40 mg by mouth in the morning.   [DISCONTINUED] aspirin EC 81 MG tablet Take 81 mg by mouth in the morning. Swallow whole.      Radiology    Chest x-ray 03/25/2021: 1. Low volume film with cardiomegaly and vascular congestion. 2. Patchy airspace disease in the left base compatible with atelectasis or pneumonia. 3. Tiny nodular density peripheral right mid lung is stable, suggesting benign etiology.  CTA of chest 09/16/2019:  4.4 cm ascending thoracic aortic aneurysm. Recommend annual imaging followup by CTA or MRA. This recommendation follows 2010 ACCF/AHA/AATS/ACR/ASA/SCA/SCAI/SIR/STS/SVM Guidelines for the Diagnosis and Management of Patients with Thoracic Aortic Disease. Circulation. 2010; 121: K025-K270. Aortic aneurysm NOS (ICD10-I71.9)   Old granulomatous disease. No acute cardiopulmonary disease. Aortic Atherosclerosis (ICD10-I70.0).  Cardiac  Studies:   CABG 2007: LIMA to  LAD, SVG to D2, SVG to OM1 and ramus intermediate with OM1 limb occluded, SVG to RCA with a proximal 50% stenosis by coronary angiography in July 2018. Normal LVEF.  Lexiscan sestamibi stress test 12/03/2016: No significant ST-T wave changes during stress test. Medium defect of moderate intensity in the basal inferior, mid inferoseptal, mid inferior and mid inferolateral and apical inferior and apical lateral location consistent with Myocardial infarction mild degree of peri-infarct ischemia. LVEF 52%. Intermediate risk study.  Coronary angiogram 10/08/2018 and had a very high-grade stenosis of a large hiatal D1/ramus branch S/P 3.0 x 23 mm Sierra Xience DES. Patent LIMA to LAD, SVG to D2 and SVG to RCA widely patent. Normal right heart catheterization, preserved cardiac output and cardiac index.  Echocardiogram 03/26/2021: 1. Left ventricular ejection fraction, by estimation, is 35 to 40%. The  left ventricle has moderately decreased function. The left ventricle  demonstrates global hypokinesis. There is mild left ventricular  hypertrophy. Left ventricular diastolic  parameters are indeterminate.   2. Right ventricular systolic function is normal. The right ventricular  size is normal.   3. Left atrial size was mildly dilated.   4. The mitral valve is normal in structure. No evidence of mitral valve  regurgitation. No evidence of mitral stenosis.   5. The aortic valve is tricuspid. There is mild calcification of the  aortic valve. Aortic valve regurgitation is trivial. Mild aortic valve  sclerosis is present, with no evidence of aortic valve stenosis.   6. The inferior vena cava is normal in size with greater than 50%  respiratory variability, suggesting right atrial pressure of 3 mmHg.   TEE 05/23/2021:  1. Left ventricular ejection fraction, by estimation, is 35 to 40%. The left ventricle has moderately decreased function. The left ventricle demonstrates global  hypokinesis.   2. Right ventricular systolic function is moderately reduced. The right ventricular size is moderately enlarged.   3. Left atrial size was severely dilated. No left atrial/left atrial appendage thrombus was detected.   4. Right atrial size was moderately dilated.   5. The mitral valve is grossly normal. Mild mitral valve regurgitation.   6. The aortic valve is tricuspid. Aortic valve regurgitation is not visualized.   7. Evidence of atrial level shunting detected by color flow Doppler. Septum primum, as well as septum secundum defects seen. 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 Multiple ASD's are noted.   8. No prior TEE for comparison.   EKG:   06/01/2021: Sinus rhythm with first-degree AV block at a rate of 80 bpm.  Normal axis.  Right bundle branch block.  Compared to EKG 05/03/2021, now in sinus rhythm.  05/03/2021: Atrial fibrillation with rapid ventricular response at a rate of 103 bpm.  Normal axis.  Right bundle branch block, secondary ST-T wave changes.  Compared to EKG 03/30/2021, ventricular rate controlled improved otherwise no significant change.  ED EKG 03/25/2021: Atrial flutter with 4-1 AV block at a rate of 128 bpm.  10/12/2020: Sinus rhythm with  first-degree AV block at rate of 82 bpm, normal axis, right bundle branch block.   Assessment:     ICD-10-CM   1. Coronary artery disease involving coronary bypass graft of native heart without angina pectoris  I25.810 PCV MYOCARDIAL PERFUSION WITH LEXISCAN    Basic metabolic panel    CBC    2. Persistent atrial fibrillation (HCC)  I48.19 EKG 12-Lead    3. ASD (atrial septal defect)  Q21.1     4. Dyspnea on exertion  R06.00 PCV MYOCARDIAL PERFUSION WITH LEXISCAN    5. PFO (patent foramen ovale)  Q21.1       No orders of the defined types were placed in this encounter.  Medications Discontinued During This Encounter  Medication Reason   aspirin EC  81 MG tablet Change in therapy    This patients CHA2DS2-VASc Score 5 (CHF, HTN, vasc, age) and yearly risk of stroke 7.2%.   Recommendations:   KINTA MARTIS  is a 80 y.o. ith known coronary artery disease and CABG in 2007 in the setting of non-STEMI in atrial fibrillation with RVR. He has a history of  45 pack year h/o smoking tobacco, ascending aortic aneurysm (follows Dr.Bryan Bartle), small abdominal aortic aneurysm noted in 2015 (3cm), H/O colon cancer, in remission since 1992, GERD, hyperlipidemia, obstructive sleep apnea not using CPAP.   Patient hospitalized 03/2021 with recurrence of atrial flutter and echocardiogram revealed new onset LVEF 35-40% with global hypokinesis.  Patient now presents for 4-week follow-up of atrial fibrillation after cardioversion.  Patient underwent successful cardioversion 05/23/2021 with return to normal sinus rhythm.  TEE prior to cardioversion noted secundum ASD and PFO.   Patient reports symptoms of fatigue and dyspnea have significantly improved since successful cardioversion.  Patient is maintaining sinus rhythm at today's visit.  Given patient's continued mild dyspnea on exertion as well as RV systolic dysfunction recommend proceeding with ASD and PFO closure.   Patient was seen in collaboration with Dr. Einar Gip reviewed and discussed at length with patient regarding procedure indications, risks, benefits.  Patient verbalized understanding risks and wishes to proceed with closure of ASD and PFO.  Prior to undergoing procedure will obtain nuclear stress test to evaluate for underlying ischemic etiology of arrhythmia now that he is in sinus rhythm.  Also obtain basic labs prior to procedure.  Follow up in 3 months, sooner if needed, for CAD, atrial fibrillation, and follow up after procedure.   Patient was seen in collaboration with Dr. Einar Gip. He also reviewed patient's chart and examined the patient. Dr. Einar Gip is in agreement of the plan.    Alethia Berthold, PA-C 06/01/2021, 2:06 PM Office: [phone]

## 2021-05-31 ENCOUNTER — Ambulatory Visit: Payer: Medicare PPO | Admitting: Student

## 2021-05-31 ENCOUNTER — Encounter: Payer: Self-pay | Admitting: Student

## 2021-05-31 ENCOUNTER — Other Ambulatory Visit: Payer: Self-pay

## 2021-05-31 VITALS — BP 122/77 | HR 85 | Temp 98.2°F | Ht 71.0 in | Wt 240.0 lb

## 2021-05-31 DIAGNOSIS — Q2112 Patent foramen ovale: Secondary | ICD-10-CM

## 2021-05-31 DIAGNOSIS — I4819 Other persistent atrial fibrillation: Secondary | ICD-10-CM

## 2021-05-31 DIAGNOSIS — I25810 Atherosclerosis of coronary artery bypass graft(s) without angina pectoris: Secondary | ICD-10-CM | POA: Diagnosis not present

## 2021-05-31 DIAGNOSIS — Q211 Atrial septal defect, unspecified: Secondary | ICD-10-CM

## 2021-05-31 DIAGNOSIS — R0609 Other forms of dyspnea: Secondary | ICD-10-CM | POA: Diagnosis not present

## 2021-06-05 ENCOUNTER — Ambulatory Visit: Payer: Medicare PPO

## 2021-06-05 ENCOUNTER — Other Ambulatory Visit: Payer: Self-pay

## 2021-06-05 DIAGNOSIS — I25810 Atherosclerosis of coronary artery bypass graft(s) without angina pectoris: Secondary | ICD-10-CM

## 2021-06-05 DIAGNOSIS — R0609 Other forms of dyspnea: Secondary | ICD-10-CM

## 2021-06-05 DIAGNOSIS — R0600 Dyspnea, unspecified: Secondary | ICD-10-CM

## 2021-06-07 NOTE — Progress Notes (Signed)
Low risk stress test. Will discuss further at next office visit.

## 2021-06-07 NOTE — Progress Notes (Signed)
Called and spoke with patient regarding her stress test results.

## 2021-06-07 NOTE — H&P (View-Only) (Signed)
Low risk stress test. Will discuss further at next office visit.

## 2021-06-08 DIAGNOSIS — I25810 Atherosclerosis of coronary artery bypass graft(s) without angina pectoris: Secondary | ICD-10-CM | POA: Diagnosis not present

## 2021-06-09 LAB — BASIC METABOLIC PANEL
BUN/Creatinine Ratio: 9 — ABNORMAL LOW (ref 10–24)
BUN: 11 mg/dL (ref 8–27)
CO2: 23 mmol/L (ref 20–29)
Calcium: 9.4 mg/dL (ref 8.6–10.2)
Chloride: 102 mmol/L (ref 96–106)
Creatinine, Ser: 1.18 mg/dL (ref 0.76–1.27)
Glucose: 89 mg/dL (ref 65–99)
Potassium: 4 mmol/L (ref 3.5–5.2)
Sodium: 140 mmol/L (ref 134–144)
eGFR: 62 mL/min/{1.73_m2} (ref >59)

## 2021-06-09 LAB — CBC
Hematocrit: 45 % (ref 37.5–51.0)
Hemoglobin: 14.7 g/dL (ref 13.0–17.7)
MCH: 27.4 pg (ref 26.6–33.0)
MCHC: 32.7 g/dL (ref 31.5–35.7)
MCV: 84 fL (ref 79–97)
Platelets: 209 10*3/uL (ref 150–450)
RBC: 5.36 x10E6/uL (ref 4.14–5.80)
RDW: 14.7 % (ref 11.6–15.4)
WBC: 6 10*3/uL (ref 3.4–10.8)

## 2021-06-12 DIAGNOSIS — Q211 Atrial septal defect, unspecified: Secondary | ICD-10-CM

## 2021-06-13 ENCOUNTER — Ambulatory Visit (HOSPITAL_COMMUNITY)
Admission: RE | Admit: 2021-06-13 | Discharge: 2021-06-13 | Disposition: A | Discharge status: Home / Self Care | Payer: Medicare PPO | Attending: Cardiology | Admitting: Cardiology

## 2021-06-13 ENCOUNTER — Other Ambulatory Visit: Payer: Self-pay

## 2021-06-13 ENCOUNTER — Encounter (HOSPITAL_COMMUNITY)
Admission: RE | Disposition: A | Discharge status: Home / Self Care | Payer: Self-pay | Source: Home / Self Care | Attending: Cardiology

## 2021-06-13 DIAGNOSIS — R0609 Other forms of dyspnea: Secondary | ICD-10-CM | POA: Insufficient documentation

## 2021-06-13 DIAGNOSIS — I480 Paroxysmal atrial fibrillation: Secondary | ICD-10-CM | POA: Insufficient documentation

## 2021-06-13 DIAGNOSIS — Q211 Atrial septal defect, unspecified: Secondary | ICD-10-CM

## 2021-06-13 DIAGNOSIS — I50810 Right heart failure, unspecified: Secondary | ICD-10-CM | POA: Diagnosis not present

## 2021-06-13 DIAGNOSIS — I517 Cardiomegaly: Secondary | ICD-10-CM

## 2021-06-13 HISTORY — PX: RIGHT HEART CATH: CATH118263

## 2021-06-13 HISTORY — PX: PATENT FORAMEN OVALE(PFO) CLOSURE: CATH118300

## 2021-06-13 LAB — POCT I-STAT EG7
Acid-Base Excess: 1 mmol/L (ref 0.0–2.0)
Acid-Base Excess: 1 mmol/L (ref 0.0–2.0)
Bicarbonate: 28.3 mmol/L — ABNORMAL HIGH (ref 20.0–28.0)
Bicarbonate: 28.6 mmol/L — ABNORMAL HIGH (ref 20.0–28.0)
Calcium, Ion: 1.26 mmol/L (ref 1.15–1.40)
Calcium, Ion: 1.3 mmol/L (ref 1.15–1.40)
HCT: 40 % (ref 39.0–52.0)
HCT: 41 % (ref 39.0–52.0)
Hemoglobin: 13.6 g/dL (ref 13.0–17.0)
Hemoglobin: 13.9 g/dL (ref 13.0–17.0)
O2 Saturation: 79 %
O2 Saturation: 79 %
Potassium: 3.7 mmol/L (ref 3.5–5.1)
Potassium: 3.8 mmol/L (ref 3.5–5.1)
Sodium: 140 mmol/L (ref 135–145)
Sodium: 141 mmol/L (ref 135–145)
TCO2: 30 mmol/L (ref 22–32)
TCO2: 30 mmol/L (ref 22–32)
pCO2, Ven: 53.9 mmHg (ref 44.0–60.0)
pCO2, Ven: 54.5 mmHg (ref 44.0–60.0)
pH, Ven: 7.328 (ref 7.250–7.430)
pH, Ven: 7.329 (ref 7.250–7.430)
pO2, Ven: 48 mmHg — ABNORMAL HIGH (ref 32.0–45.0)
pO2, Ven: 48 mmHg — ABNORMAL HIGH (ref 32.0–45.0)

## 2021-06-13 LAB — POCT I-STAT 7, (LYTES, BLD GAS, ICA,H+H)
Acid-Base Excess: 1 mmol/L (ref 0.0–2.0)
Bicarbonate: 26.8 mmol/L (ref 20.0–28.0)
Calcium, Ion: 1.29 mmol/L (ref 1.15–1.40)
HCT: 41 % (ref 39.0–52.0)
Hemoglobin: 13.9 g/dL (ref 13.0–17.0)
O2 Saturation: 95 %
Potassium: 3.8 mmol/L (ref 3.5–5.1)
Sodium: 140 mmol/L (ref 135–145)
TCO2: 28 mmol/L (ref 22–32)
pCO2 arterial: 48.3 mmHg — ABNORMAL HIGH (ref 32.0–48.0)
pH, Arterial: 7.352 (ref 7.350–7.450)
pO2, Arterial: 79 mmHg — ABNORMAL LOW (ref 83.0–108.0)

## 2021-06-13 LAB — POCT ACTIVATED CLOTTING TIME
Activated Clotting Time: 254 seconds
Activated Clotting Time: 260 seconds
Activated Clotting Time: 289 seconds
Activated Clotting Time: >1000 seconds
Activated Clotting Time: 254 seconds
Activated Clotting Time: 196 seconds
Activated Clotting Time: 173 seconds

## 2021-06-13 SURGERY — PATENT FORAMEN OVALE (PFO) CLOSURE
Anesthesia: LOCAL

## 2021-06-13 MED ORDER — SODIUM CHLORIDE 0.9 % IV SOLN: 250.0000 mL | INTRAVENOUS | Status: DC | PRN

## 2021-06-13 MED ORDER — ACETAMINOPHEN 325 MG PO TABS
ORAL_TABLET | ORAL | Status: AC
  Filled 2021-06-13: qty 2

## 2021-06-13 MED ORDER — MIDAZOLAM HCL 2 MG/2ML IJ SOLN
INTRAMUSCULAR | Status: DC | PRN
  Administered 2021-06-13: 0.5 mg via INTRAVENOUS
  Administered 2021-06-13: 2 mg via INTRAVENOUS
  Administered 2021-06-13 (×2): 1 mg via INTRAVENOUS

## 2021-06-13 MED ORDER — HEPARIN (PORCINE) IN NACL 1000-0.9 UT/500ML-% IV SOLN
INTRAVENOUS | Status: DC | PRN
  Administered 2021-06-13 (×2): 500 mL

## 2021-06-13 MED ORDER — SODIUM CHLORIDE 0.9% FLUSH: 3.0000 mL | INTRAVENOUS | Status: DC | PRN

## 2021-06-13 MED ORDER — CLOPIDOGREL BISULFATE 75 MG PO TABS: 75.0000 mg | ORAL_TABLET | Freq: Every day | ORAL | 11 refills | Status: DC

## 2021-06-13 MED ORDER — FAMOTIDINE IN NACL 20-0.9 MG/50ML-% IV SOLN
INTRAVENOUS | Status: AC
  Filled 2021-06-13: qty 50

## 2021-06-13 MED ORDER — FAMOTIDINE IN NACL 20-0.9 MG/50ML-% IV SOLN
INTRAVENOUS | Status: AC | PRN
  Administered 2021-06-13: 20 mg via INTRAVENOUS

## 2021-06-13 MED ORDER — ONDANSETRON HCL 4 MG/2ML IJ SOLN: 4.0000 mg | Freq: Four times a day (QID) | INTRAMUSCULAR | Status: DC | PRN

## 2021-06-13 MED ORDER — ACETAMINOPHEN 325 MG PO TABS
650.0000 mg | ORAL_TABLET | ORAL | Status: DC | PRN
  Administered 2021-06-13: 650 mg via ORAL
  Filled 2021-06-13: qty 2

## 2021-06-13 MED ORDER — CEFAZOLIN SODIUM-DEXTROSE 2-4 GM/100ML-% IV SOLN
2.0000 g | INTRAVENOUS | Status: AC
  Administered 2021-06-13: 2 g via INTRAVENOUS
  Filled 2021-06-13: qty 100

## 2021-06-13 MED ORDER — FENTANYL CITRATE (PF) 100 MCG/2ML IJ SOLN
INTRAMUSCULAR | Status: AC
  Filled 2021-06-13: qty 2

## 2021-06-13 MED ORDER — MIDAZOLAM HCL 2 MG/2ML IJ SOLN
INTRAMUSCULAR | Status: AC
  Filled 2021-06-13: qty 2

## 2021-06-13 MED ORDER — SODIUM CHLORIDE 0.9 % IV SOLN: INTRAVENOUS | Status: DC

## 2021-06-13 MED ORDER — FENTANYL CITRATE (PF) 100 MCG/2ML IJ SOLN
50.0000 ug | Freq: Once | INTRAMUSCULAR | Status: AC
  Administered 2021-06-13: 50 ug via INTRAVENOUS
  Filled 2021-06-13: qty 2

## 2021-06-13 MED ORDER — CLOPIDOGREL BISULFATE 75 MG PO TABS: 75.0000 mg | ORAL_TABLET | Freq: Every day | ORAL | 0 refills | Status: DC

## 2021-06-13 MED ORDER — ASPIRIN 81 MG PO CHEW
81.0000 mg | CHEWABLE_TABLET | ORAL | Status: AC
  Administered 2021-06-13: 81 mg via ORAL
  Filled 2021-06-13: qty 1

## 2021-06-13 MED ORDER — LIDOCAINE HCL (PF) 1 % IJ SOLN
INTRAMUSCULAR | Status: AC
  Filled 2021-06-13: qty 30

## 2021-06-13 MED ORDER — HEPARIN SODIUM (PORCINE) 1000 UNIT/ML IJ SOLN
INTRAMUSCULAR | Status: AC
  Filled 2021-06-13: qty 1

## 2021-06-13 MED ORDER — FENTANYL CITRATE (PF) 100 MCG/2ML IJ SOLN
INTRAMUSCULAR | Status: DC | PRN
  Administered 2021-06-13 (×5): 25 ug via INTRAVENOUS

## 2021-06-13 MED ORDER — HEPARIN (PORCINE) IN NACL 1000-0.9 UT/500ML-% IV SOLN
INTRAVENOUS | Status: AC
  Filled 2021-06-13: qty 500

## 2021-06-13 MED ORDER — SODIUM CHLORIDE 0.9% FLUSH: 3.0000 mL | Freq: Two times a day (BID) | INTRAVENOUS | Status: DC

## 2021-06-13 MED ORDER — CLOPIDOGREL BISULFATE 300 MG PO TABS
ORAL_TABLET | ORAL | Status: AC
  Filled 2021-06-13: qty 2

## 2021-06-13 MED ORDER — CLOPIDOGREL BISULFATE 300 MG PO TABS
ORAL_TABLET | ORAL | Status: DC | PRN
  Administered 2021-06-13: 600 mg via ORAL

## 2021-06-13 MED ORDER — FENTANYL CITRATE (PF) 100 MCG/2ML IJ SOLN
50.0000 ug | Freq: Once | INTRAMUSCULAR | Status: AC
  Administered 2021-06-13: 50 ug via INTRAVENOUS

## 2021-06-13 MED ORDER — HEPARIN SODIUM (PORCINE) 1000 UNIT/ML IJ SOLN
INTRAMUSCULAR | Status: DC | PRN
  Administered 2021-06-13: 10000 [IU] via INTRAVENOUS
  Administered 2021-06-13: 2000 [IU] via INTRAVENOUS
  Administered 2021-06-13 (×2): 3000 [IU] via INTRAVENOUS

## 2021-06-13 MED ORDER — LIDOCAINE HCL (PF) 1 % IJ SOLN
INTRAMUSCULAR | Status: DC | PRN
  Administered 2021-06-13: 10 mL

## 2021-06-13 MED ORDER — SODIUM CHLORIDE 0.9 % IV SOLN
INTRAVENOUS | Status: AC | PRN
  Administered 2021-06-13 (×3): 10 mL/h via INTRAVENOUS

## 2021-06-13 MED ORDER — APIXABAN 5 MG PO TABS: 5.0000 mg | ORAL_TABLET | Freq: Two times a day (BID) | ORAL | 3 refills | Status: DC

## 2021-06-13 SURGICAL SUPPLY — 27 items
BALLN SIZING AMPLATZER 24 (BALLOONS) ×4
BALLOON SIZING AMPLATZER 24 (BALLOONS) IMPLANT
BALN SZ 70X4.5 7FR 3 LUM (BALLOONS) ×2
CATH ACUNAV 8FR 90CM (CATHETERS) ×1 IMPLANT
CATH EXPO 5F MPA-1 (CATHETERS) ×1 IMPLANT
CATH SWAN GANZ 7F STRAIGHT (CATHETERS) ×1 IMPLANT
CATH VISTA GUIDE 6FR H STICK (CATHETERS) ×1 IMPLANT
COVER SWIFTLINK CONNECTOR (BAG) ×1 IMPLANT
ELECT DEFIB PAD ADLT CADENCE (PAD) ×1 IMPLANT
GUIDEWIRE AMPLATZER 1.5JX260 (WIRE) ×2 IMPLANT
KIT HEART LEFT (KITS) ×2 IMPLANT
KIT MICROPUNCTURE NIT STIFF (SHEATH) ×1 IMPLANT
MAT PREVALON FULL STRYKER (MISCELLANEOUS) ×1 IMPLANT
OCCLUDER AMPLATZER SEPTAL 10MM (Prosthesis & Implant Heart) ×1 IMPLANT
OCCLUDER AMPLATZER SEPTAL 12MM (Prosthesis & Implant Heart) ×1 IMPLANT
PACK CARDIAC CATHETERIZATION (CUSTOM PROCEDURE TRAY) ×2 IMPLANT
SHEATH INTROD W/O MIN 9FR 25CM (SHEATH) ×1 IMPLANT
SHEATH PINNACLE 8F 10CM (SHEATH) ×2 IMPLANT
SHEATH PINNACLE 9F 10CM (SHEATH) ×1 IMPLANT
SYS DELIVER AMP TREVISIO 8FR (SHEATH) ×2
SYS DELIVER AMP TREVISIO 9FR (SHEATH) ×2
SYSTEM DELIVER AMP TREVIS 8FR (SHEATH) IMPLANT
SYSTEM DELIVER AMP TREVIS 9FR (SHEATH) IMPLANT
TRANSDUCER W/STOPCOCK (MISCELLANEOUS) ×2 IMPLANT
TUBING CIL FLEX 10 FLL-RA (TUBING) ×2 IMPLANT
WIRE HI TORQ VERSACORE-J 145CM (WIRE) ×1 IMPLANT
WIRE MICROINTRODUCER 60CM (WIRE) ×1 IMPLANT

## 2021-06-13 NOTE — Interval H&P Note (Signed)
History and Physical Interval Note:  06/13/2021 8:06 AM  [name]  has presented today for surgery, with the diagnosis of ASD.  The various methods of treatment have been discussed with the patient and family. After consideration of risks, benefits and other options for treatment, the patient has consented to  Procedure(s): PATENT FORAMEN OVALE (PFO) CLOSURE (N/A) and repair of the secundum atrial septal defect as a surgical intervention.  The patient's history has been reviewed, patient examined, no change in status, stable for surgery.  I have reviewed the patient's chart and labs.  Questions were answered to the patient's satisfaction.  \ Indication dyspnea on exertion and RV dilatation.   Adrian Prows

## 2021-06-13 NOTE — Progress Notes (Signed)
Patient complaining of chronic pain in his back. Patient reported that Tylenol does not help and is requesting something stronger. Einar Gip, MD notified. Verbal orders given for Fentanyl 50 mcg IV once. Orders placed and followed.

## 2021-06-13 NOTE — CV Procedure (Signed)
Successful repair of the atrial septal defects with 2 Amplatzer ASO 10 mm and 12 mm device. No residual defects.   Adrian Prows, MD, [facility] 06/13/2021, 10:36 AM Office: [phone] Fax: [phone] Pager: [phone]

## 2021-06-13 NOTE — Progress Notes (Signed)
Site area: rt groin fv sheaths x3 Site Prior to Removal:  Level 0 Pressure Applied For: 20 minutes Manual:   yes Patient Status During Pull:  stable Post Pull Site:  Level 0 Post Pull Instructions Given:  yes Post Pull Pulses Present: rt PT palpable Dressing Applied:  gauze and tegaderm Bedrest begins @ 1340 Comments:

## 2021-06-18 DIAGNOSIS — I517 Cardiomegaly: Secondary | ICD-10-CM

## 2021-06-20 ENCOUNTER — Encounter (HOSPITAL_COMMUNITY): Payer: Self-pay | Admitting: Cardiology

## 2021-06-26 ENCOUNTER — Other Ambulatory Visit: Payer: Medicare PPO

## 2021-06-27 ENCOUNTER — Ambulatory Visit: Payer: Medicare PPO | Admitting: Student

## 2021-06-27 ENCOUNTER — Encounter: Payer: Self-pay | Admitting: Student

## 2021-06-27 ENCOUNTER — Other Ambulatory Visit: Payer: Self-pay

## 2021-06-27 VITALS — BP 113/67 | HR 84 | Temp 98.5°F | Ht 71.0 in | Wt 242.0 lb

## 2021-06-27 DIAGNOSIS — I25810 Atherosclerosis of coronary artery bypass graft(s) without angina pectoris: Secondary | ICD-10-CM | POA: Diagnosis not present

## 2021-06-27 DIAGNOSIS — I480 Paroxysmal atrial fibrillation: Secondary | ICD-10-CM | POA: Diagnosis not present

## 2021-06-27 DIAGNOSIS — Q211 Atrial septal defect, unspecified: Secondary | ICD-10-CM

## 2021-06-27 NOTE — Progress Notes (Signed)
Primary Physician:  Shirline Frees, MD   Patient ID: [name], male    DOB: [birth year], 80 y.o.   MRN: [id number]  Subjective:    Chief Complaint  Patient presents with   Coronary Artery Disease   Hospitalization Follow-up    HPI: [name]  is a 80 y.o. male  with known coronary artery disease and CABG in 2007 in the setting of non-STEMI in atrial fibrillation with RVR. He has a history of  45 pack year h/o smoking tobacco, ascending aortic aneurysm (follows Dr.Bryan Bartle), small abdominal aortic aneurysm noted in 2015 (3cm), H/O colon cancer, in remission since 1992, GERD, hyperlipidemia, obstructive sleep apnea now on CPAP.   Patient hospitalized 03/2021 with recurrence of atrial flutter and echocardiogram revealed new onset LVEF 35-40% with global hypokinesis. Patient underwent successful cardioversion 05/23/2021 with return to normal sinus rhythm.  TEE prior to cardioversion noted secundum ASD and PFO, which Dr. Einar Gip recommended patient undergo closure of.  Patient right heart catheterization which revealed significant shunting, followed by repair of fenestrated ASD 06/13/2021 by Dr. Einar Gip.  Patient was discharged with recommendation for antibiotic prophylaxis for 6 months as well as to continue Eliquis for PAF and start Plavix only for a period of 4 weeks and then probably aspirin for additional 8 weeks and then Eliquis alone.  Patient now presents for follow up.  Patient has noticed significant improvement of dyspnea and fatigue following closure of ASD.  He is aware of need for antibiotic prophylaxis prior to dental procedures. Denies chest pain, palpitations, syncope, near syncope, dizziness, orthopnea, PND, leg swelling.  Denies symptoms suggestive of TIA/CVA.  Patient has had no known recurrence of atrial fibrillation since cardioversion.  He continues to tolerate anticoagulation without bleeding diathesis.  Past Medical History:  Diagnosis Date   Allergy     Arthritis    BPH (benign prostatic hyperplasia)    CAD (coronary artery disease)    s/p CABG 2007; NSTEMI in setting of AFib with RVR 12/2009; cath 1/11: S-RCA ok with prox 30-40% and 50% stenoses; S-OM and RI  with patent OM limb but an occluded RI limb; S-D2 and L-LAD ok; EF 55%   Cataract    removed both eyes    Clotting disorder (HCC)    s/p CABG x 5- PE    Colon cancer ([place]) 1992   COPD (chronic obstructive pulmonary disease) ([place])    Diverticulosis    Essential hypertension    Family history of breast cancer    Family history of colon cancer    Family history of lung cancer    GERD (gastroesophageal reflux disease)    History of pulmonary embolus (PE) 2007   Following CABG   Hyperlipidemia    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HCC)    Right upper   OSA on CPAP    Paroxysmal atrial fibrillation (HCC)    Previously on Multaq, declines anticoagulation   Personal history of colonic polyps 01/22/2000   Tubular adenoma   RBBB (right bundle branch block)    Sleep apnea    no cpap    Squamous cell carcinoma of skin 02/11/2008   [place]-Left paraspinal, lower    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   in situ-left forearm   Past Surgical History:  Procedure Laterality Date   CARDIOVERSION N/A 05/23/2021   Procedure: CARDIOVERSION;  Surgeon: Nigel Mormon, MD;  Location: [facility];  Service: Cardiovascular;  Laterality: N/A;   COLECTOMY  1992   w/ colostomy  related to colon cancer   COLON SURGERY  1993   to take down Colostomy   COLONOSCOPY     CORONARY ARTERY BYPASS GRAFT  2007   CORONARY STENT INTERVENTION N/A 10/07/2018   Procedure: CORONARY STENT INTERVENTION;  Surgeon: Adrian Prows, MD;  Location: [facility];  Service: Cardiovascular;  Laterality: N/A;   HAND [place]   tendon repair and nerve; left   INGUINAL HERNIA REPAIR  2009   LAPAROSCOPIC CHOLECYSTECTOMY  2004   PATENT FORAMEN OVALE(PFO) CLOSURE N/A 06/13/2021   Procedure: PATENT  FORAMEN OVALE (PFO) CLOSURE;  Surgeon: Adrian Prows, MD;  Location: [facility];  Service: Cardiovascular;  Laterality: N/A;   POLYPECTOMY     RIGHT HEART CATH N/A 06/13/2021   Procedure: RIGHT HEART CATH;  Surgeon: Adrian Prows, MD;  Location: [facility];  Service: Cardiovascular;  Laterality: N/A;   RIGHT/LEFT HEART CATH AND CORONARY/GRAFT ANGIOGRAPHY N/A 07/03/2017   Procedure: Right/Left Heart Cath and Coronary/Graft Angiography;  Surgeon: Leonie Man, MD;  Location: [facility];  Service: Cardiovascular;  Laterality: N/A;   RIGHT/LEFT HEART CATH AND CORONARY/GRAFT ANGIOGRAPHY N/A 10/07/2018   Procedure: RIGHT/LEFT HEART CATH AND CORONARY/GRAFT ANGIOGRAPHY;  Surgeon: Adrian Prows, MD;  Location: [facility];  Service: Cardiovascular;  Laterality: N/A;   TEE WITHOUT CARDIOVERSION  05/23/2021   Procedure: TRANSESOPHAGEAL ECHOCARDIOGRAM (TEE);  Surgeon: Nigel Mormon, MD;  Location: [facility];  Service: Cardiovascular;;   UMBILICAL HERNIA REPAIR     VENTRAL HERNIA REPAIR  2009   Family History  Problem Relation Age of Onset   Lung cancer Mother    Heart disease Mother    Emphysema Father    Heart disease Father    Cancer Sister 44       unknown type   Breast cancer Sister        dx. >50   Colon cancer Sister        dx. >50   Breast cancer Sister        dx. >50   Colon cancer Sister        dx. >50   Colon cancer Cousin        dx. early 64s   Esophageal cancer Neg Hx    Rectal cancer Neg Hx    Stomach cancer Neg Hx    Colon polyps Neg Hx    Social History   Tobacco Use   Smoking status: Former    Packs/day: 1.00    Years: 45.00    Pack years: 45.00    Types: Cigarettes    Quit date: 12/18/1995    Years since quitting: 25.5   Smokeless tobacco: Never  Substance Use Topics   Alcohol use: No    Comment: quit in 1980   Marital Status: Married   ROS   Review of Systems  Constitutional: Negative for malaise/fatigue (resolved) and weight  gain.  Cardiovascular:  Negative for chest pain, claudication, dyspnea on exertion (improved), leg swelling, near-syncope, orthopnea, palpitations, paroxysmal nocturnal dyspnea and syncope.  Musculoskeletal:  Positive for back pain and joint pain (knee).  Neurological:  Negative for dizziness.  Objective:  Blood pressure 113/67, pulse 84, temperature 98.5 F (36.9 C), height 5\' 11"  (1.803 m), weight 242 lb (109.8 kg), SpO2 95 %. Body mass index is 33.75 kg/m.  Vitals with BMI 06/27/2021 06/13/2021 06/13/2021  Height 5\' 11"  - -  Weight 242 lbs - -  BMI 33.77 - -  Systolic 242 683 419  Diastolic 67 622 297  Pulse 84 94 110    Physical Exam Vitals reviewed.  Constitutional:      Appearance: He is well-developed.  Cardiovascular:     Rate and Rhythm: Normal rate and regular rhythm.     Pulses: Intact distal pulses.          Femoral pulses are 2+ on the right side and 2+ on the left side.      Popliteal pulses are 1+ on the right side and 1+ on the left side.       Dorsalis pedis pulses are 1+ on the right side and 1+ on the left side.       Posterior tibial pulses are 2+ on the right side and 2+ on the left side.     Heart sounds: Normal heart sounds, S1 normal and S2 normal. No murmur heard.   No gallop.     Comments: Mild ecchymosis noted at femoral access site.  No hematoma or bruit. Pulmonary:     Effort: Pulmonary effort is normal. No accessory muscle usage or respiratory distress.     Breath sounds: Normal breath sounds. No wheezing, rhonchi or rales.  Musculoskeletal:     Right lower leg: No edema.     Left lower leg: No edema.  Skin:    General: Skin is warm and dry.  Neurological:     Mental Status: He is alert.   Laboratory examination:   CMP Latest Ref Rng & Units 06/13/2021 06/13/2021 06/13/2021  Glucose 65 - 99 mg/dL - - -  BUN 8 - 27 mg/dL - - -  Creatinine 0.76 - 1.27 mg/dL - - -  Sodium 135 - 145 mmol/L 140 141 140  Potassium 3.5 - 5.1 mmol/L 3.8 3.8 3.7   Chloride 96 - 106 mmol/L - - -  CO2 20 - 29 mmol/L - - -  Calcium 8.6 - 10.2 mg/dL - - -  Total Protein 6.5 - 8.1 g/dL - - -  Total Bilirubin 0.3 - 1.2 mg/dL - - -  Alkaline Phos 38 - 126 U/L - - -  AST 15 - 41 U/L - - -  ALT 0 - 44 U/L - - -   CBC Latest Ref Rng & Units 06/13/2021 06/13/2021 06/13/2021  WBC 3.4 - 10.8 x10E3/uL - - -  Hemoglobin 13.0 - 17.0 g/dL 13.9 13.9 13.6  Hematocrit 39.0 - 52.0 % 41.0 41.0 40.0  Platelets 150 - 450 x10E3/uL - - -   Lipid Panel     Component Value Date/Time   CHOL 134 09/08/2015 1210   TRIG 211.0 (H) 09/08/2015 1210   HDL 36.10 (L) 09/08/2015 1210   CHOLHDL 4 09/08/2015 1210   VLDL 42.2 (H) 09/08/2015 1210   LDLCALC 59 07/23/2014 1638   LDLDIRECT 87.0 09/08/2015 1210   HEMOGLOBIN A1C No results found for: HGBA1C, MPG TSH Recent Labs    03/25/21 1443  TSH 0.630    External Labs:  Cholesterol, total 128.000 m 06/22/2020 HDL 34.000 mg 06/22/2020 LDL-C 63.000 mg 06/22/2020 Triglycerides 183.000 m 06/22/2020  Hemoglobin 14.900 g/d 06/22/2020  Creatinine, Serum 1.250 mg/ 06/22/2020 Potassium 3.900 mm 06/22/2020 ALT (SGPT) 30.000 U/L 06/22/2020  TSH 2.120 06/22/2020   Allergies   Allergies  Allergen Reactions   Morphine Sulfate Itching   Rosuvastatin Calcium Other (See Comments)    Muscle weakness   Ultram [Tramadol] Itching    Medications Prior to Visit:   Outpatient Medications Prior to  Visit  Medication Sig Dispense Refill   albuterol (PROVENTIL HFA;VENTOLIN HFA) 108 (90 Base) MCG/ACT inhaler Inhale 2 puffs into the lungs every 4 (four) hours as needed for wheezing or shortness of breath. 1 Inhaler 5   albuterol (PROVENTIL) (2.5 MG/3ML) 0.083% nebulizer solution USE 1 VIAL IN NEBULIZER EVERY 6 HOURS AS NEEDED FOR WHEEZING OR SHORTNESS OF BREATH 75 mL 3   apixaban (ELIQUIS) 5 MG TABS tablet Take 1 tablet (5 mg total) by mouth 2 (two) times daily. 60 tablet 3   atorvastatin (LIPITOR) 20 MG tablet Take 20 mg by mouth at bedtime.   5    clonazePAM (KLONOPIN) 1 MG tablet Take 1 mg by mouth at bedtime as needed (sleep).     clopidogrel (PLAVIX) 75 MG tablet Take 1 tablet (75 mg total) by mouth daily. 30 tablet 0   fluticasone (FLONASE) 50 MCG/ACT nasal spray Place 2 sprays into both nostrils daily. 16 g 2   gabapentin (NEURONTIN) 100 MG capsule Take 100-200 mg by mouth See admin instructions. Take 200 mg am & 100 mg at bedtime     guaiFENesin (MUCINEX) 600 MG 12 hr tablet Take 600 mg by mouth daily as needed (bronchitis).     HYDROcodone-acetaminophen (NORCO/VICODIN) 5-325 MG tablet Take 1 tablet by mouth every 6 (six) hours as needed for moderate pain.     isosorbide mononitrate (IMDUR) 30 MG 24 hr tablet Take 1 tablet by mouth once daily 90 tablet 1   Magnesium 200 MG TABS Take 200 mg by mouth in the morning.     metoprolol tartrate (LOPRESSOR) 50 MG tablet Take 1 tablet (50 mg total) by mouth 2 (two) times daily. 180 tablet 3   Multiple Vitamin (MULTIVITAMIN WITH MINERALS) TABS tablet Take 1 tablet by mouth in the morning.     nitroGLYCERIN (NITROSTAT) 0.4 MG SL tablet Place 1 tablet (0.4 mg total) under the tongue every 5 (five) minutes as needed for chest pain. 25 tablet 3   Omega-3 Fatty Acids (FISH OIL) 1000 MG CAPS Take 1,000 mg by mouth in the morning.     omeprazole (PRILOSEC) 20 MG capsule Take 1 capsule (20 mg total) by mouth daily. 30 capsule 3   Phenylephrine-DM-GG (ROBITUSSIN COUGH/COLD CF MAX PO) Take 20 mLs by mouth every 6 (six) hours as needed (cough).     Potassium Chloride ER 20 MEQ TBCR Take 20 mEq by mouth in the morning.     STIOLTO RESPIMAT 2.5-2.5 MCG/ACT AERS INHALE 2 PUFFS BY MOUTH ONCE DAILY 4 g 5   tamsulosin (FLOMAX) 0.4 MG CAPS capsule Take 0.8 mg by mouth at bedtime.     tiZANidine (ZANAFLEX) 4 MG tablet Take 4 mg by mouth at bedtime as needed for muscle spasms.     torsemide (DEMADEX) 20 MG tablet Take 40 mg by mouth in the morning.     No facility-administered medications prior to visit.    Final Medications at End of Visit    Current Meds  Medication Sig   albuterol (PROVENTIL HFA;VENTOLIN HFA) 108 (90 Base) MCG/ACT inhaler Inhale 2 puffs into the lungs every 4 (four) hours as needed for wheezing or shortness of breath.   albuterol (PROVENTIL) (2.5 MG/3ML) 0.083% nebulizer solution USE 1 VIAL IN NEBULIZER EVERY 6 HOURS AS NEEDED FOR WHEEZING OR SHORTNESS OF BREATH   apixaban (ELIQUIS) 5 MG TABS tablet Take 1 tablet (5 mg total) by mouth 2 (two) times daily.   atorvastatin (LIPITOR) 20 MG tablet Take 20 mg by  mouth at bedtime.    clonazePAM (KLONOPIN) 1 MG tablet Take 1 mg by mouth at bedtime as needed (sleep).   clopidogrel (PLAVIX) 75 MG tablet Take 1 tablet (75 mg total) by mouth daily.   fluticasone (FLONASE) 50 MCG/ACT nasal spray Place 2 sprays into both nostrils daily.   gabapentin (NEURONTIN) 100 MG capsule Take 100-200 mg by mouth See admin instructions. Take 200 mg am & 100 mg at bedtime   guaiFENesin (MUCINEX) 600 MG 12 hr tablet Take 600 mg by mouth daily as needed (bronchitis).   HYDROcodone-acetaminophen (NORCO/VICODIN) 5-325 MG tablet Take 1 tablet by mouth every 6 (six) hours as needed for moderate pain.   isosorbide mononitrate (IMDUR) 30 MG 24 hr tablet Take 1 tablet by mouth once daily   Magnesium 200 MG TABS Take 200 mg by mouth in the morning.   metoprolol tartrate (LOPRESSOR) 50 MG tablet Take 1 tablet (50 mg total) by mouth 2 (two) times daily.   Multiple Vitamin (MULTIVITAMIN WITH MINERALS) TABS tablet Take 1 tablet by mouth in the morning.   nitroGLYCERIN (NITROSTAT) 0.4 MG SL tablet Place 1 tablet (0.4 mg total) under the tongue every 5 (five) minutes as needed for chest pain.   Omega-3 Fatty Acids (FISH OIL) 1000 MG CAPS Take 1,000 mg by mouth in the morning.   omeprazole (PRILOSEC) 20 MG capsule Take 1 capsule (20 mg total) by mouth daily.   Phenylephrine-DM-GG (ROBITUSSIN COUGH/COLD CF MAX PO) Take 20 mLs by mouth every 6 (six) hours as needed  (cough).   Potassium Chloride ER 20 MEQ TBCR Take 20 mEq by mouth in the morning.   STIOLTO RESPIMAT 2.5-2.5 MCG/ACT AERS INHALE 2 PUFFS BY MOUTH ONCE DAILY   tamsulosin (FLOMAX) 0.4 MG CAPS capsule Take 0.8 mg by mouth at bedtime.   tiZANidine (ZANAFLEX) 4 MG tablet Take 4 mg by mouth at bedtime as needed for muscle spasms.   torsemide (DEMADEX) 20 MG tablet Take 40 mg by mouth in the morning.   Radiology    Chest x-ray 03/25/2021: 1. Low volume film with cardiomegaly and vascular congestion. 2. Patchy airspace disease in the left base compatible with atelectasis or pneumonia. 3. Tiny nodular density peripheral right mid lung is stable, suggesting benign etiology.  CTA of chest 09/16/2019:  4.4 cm ascending thoracic aortic aneurysm. Recommend annual imaging followup by CTA or MRA. This recommendation follows 2010 ACCF/AHA/AATS/ACR/ASA/SCA/SCAI/SIR/STS/SVM Guidelines for the Diagnosis and Management of Patients with Thoracic Aortic Disease. Circulation. 2010; 121: H852-D782. Aortic aneurysm NOS (ICD10-I71.9)   Old granulomatous disease. No acute cardiopulmonary disease. Aortic Atherosclerosis (ICD10-I70.0).  Cardiac Studies:   CABG 2007: LIMA to LAD, SVG to D2, SVG to OM1 and ramus intermediate with OM1 limb occluded, SVG to RCA with a proximal 50% stenosis by coronary angiography in July 2018. Normal LVEF.  Lexiscan sestamibi stress test 12/03/2016: No significant ST-T wave changes during stress test. Medium defect of moderate intensity in the basal inferior, mid inferoseptal, mid inferior and mid inferolateral and apical inferior and apical lateral location consistent with Myocardial infarction mild degree of peri-infarct ischemia. LVEF 52%. Intermediate risk study.  Coronary angiogram 10/08/2018 and had a very high-grade stenosis of a large hiatal D1/ramus branch S/P 3.0 x 23 mm Sierra Xience DES. Patent LIMA to LAD, SVG to D2 and SVG to RCA widely patent. Normal right heart  catheterization, preserved cardiac output and cardiac index.  Echocardiogram 03/26/2021: 1. Left ventricular ejection fraction, by estimation, is 35 to 40%. The  left ventricle  has moderately decreased function. The left ventricle  demonstrates global hypokinesis. There is mild left ventricular  hypertrophy. Left ventricular diastolic  parameters are indeterminate.   2. Right ventricular systolic function is normal. The right ventricular  size is normal.   3. Left atrial size was mildly dilated.   4. The mitral valve is normal in structure. No evidence of mitral valve  regurgitation. No evidence of mitral stenosis.   5. The aortic valve is tricuspid. There is mild calcification of the  aortic valve. Aortic valve regurgitation is trivial. Mild aortic valve  sclerosis is present, with no evidence of aortic valve stenosis.   6. The inferior vena cava is normal in size with greater than 50%  respiratory variability, suggesting right atrial pressure of 3 mmHg.   TEE 05/23/2021:  1. Left ventricular ejection fraction, by estimation, is 35 to 40%. The left ventricle has moderately decreased function. The left ventricle demonstrates global hypokinesis.   2. Right ventricular systolic function is moderately reduced. The right ventricular size is moderately enlarged.   3. Left atrial size was severely dilated. No left atrial/left atrial appendage thrombus was detected.   4. Right atrial size was moderately dilated.   5. The mitral valve is grossly normal. Mild mitral valve regurgitation.   6. The aortic valve is tricuspid. Aortic valve regurgitation is not visualized.   7. Evidence of atrial level shunting detected by color flow Doppler. Septum primum, as well as septum secundum defects seen. 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 Multiple ASD's are noted.   8. No prior TEE for comparison.   EKG:   06/27/2021: Sinus rhythm with  first-degree AV block at a rate of 82 bpm.  Normal axis.  Right bundle branch block.  Compared to EKG 06/01/2021, no significant change.  05/03/2021: Atrial fibrillation with rapid ventricular response at a rate of 103 bpm.  Normal axis.  Right bundle branch block, secondary ST-T wave changes.  Compared to EKG 03/30/2021, ventricular rate controlled improved otherwise no significant change.  ED EKG 03/25/2021: Atrial flutter with 4-1 AV block at a rate of 128 bpm.  10/12/2020: Sinus rhythm with  first-degree AV block at rate of 82 bpm, normal axis, right bundle branch block.   Assessment:     ICD-10-CM   1. Coronary artery disease involving coronary bypass graft of native heart without angina pectoris  I25.810 EKG 12-Lead    2. ASD (atrial septal defect) s/p closure 06/13/2021  Q21.1 EKG 12-Lead    3. Paroxysmal atrial fibrillation (HCC)  I48.0 EKG 12-Lead      No orders of the defined types were placed in this encounter.  There are no discontinued medications.  This patients CHA2DS2-VASc Score 5 (CHF, HTN, vasc, age) and yearly risk of stroke 7.2%.   Recommendations:   [name]  is a 80 y.o. with known coronary artery disease and CABG in 2007 in the setting of non-STEMI in atrial fibrillation with RVR. He has a history of  45 pack year h/o smoking tobacco, ascending aortic aneurysm (follows Dr.Bryan Bartle), small abdominal aortic aneurysm noted in 2015 (3cm), H/O colon cancer, in remission since 1992, GERD, hyperlipidemia, obstructive sleep apnea now on CPAP.   Patient hospitalized 03/2021 with recurrence of atrial flutter and echocardiogram revealed new onset LVEF 35-40% with global hypokinesis. Patient underwent successful cardioversion 05/23/2021 with return to normal sinus rhythm.  TEE prior to cardioversion noted secundum ASD and PFO, which  Dr. Einar Gip recommended patient undergo closure of.  Patient right heart catheterization which revealed significant shunting, followed by repair  of fenestrated ASD 06/13/2021 by Dr. Einar Gip.  Patient was discharged with recommendation for antibiotic prophylaxis for 6 months as well as to continue Eliquis for PAF and start Plavix only for a period of 4 weeks and then probably aspirin for additional 8 weeks and then Eliquis alone.  Patient now presents for follow up.  Patient is presently feeling well and has slowly been increasing physical activity.  He has noticed significant improvement of dyspnea and fatigue.  He is tolerating anticoagulation without bleeding diathesis.  Reviewed with patient above recommendation regarding Eliquis, aspirin, and Plavix, he verbalized understanding and agreement.  Also discussed with patient the importance of antibiotic prophylaxis, patient will notify our office prior to undergoing any dental procedures.  Patient's blood pressure is well controlled and he is maintaining sinus rhythm.  Will not make changes to medications at this time.  Follow-up in 3 months, sooner if needed, for CAD, paroxysmal atrial fibrillation.    Alethia Berthold, PA-C 06/27/2021, 4:59 PM Office: [phone]

## 2021-07-18 ENCOUNTER — Other Ambulatory Visit: Payer: Self-pay | Admitting: *Deleted

## 2021-07-18 DIAGNOSIS — I712 Thoracic aortic aneurysm, without rupture, unspecified: Secondary | ICD-10-CM

## 2021-07-19 ENCOUNTER — Encounter: Payer: Self-pay | Admitting: Emergency Medicine

## 2021-07-19 ENCOUNTER — Other Ambulatory Visit: Payer: Self-pay

## 2021-07-19 ENCOUNTER — Ambulatory Visit: Payer: Medicare PPO | Admitting: Emergency Medicine

## 2021-07-19 DIAGNOSIS — J449 Chronic obstructive pulmonary disease, unspecified: Secondary | ICD-10-CM | POA: Diagnosis not present

## 2021-07-19 DIAGNOSIS — G4733 Obstructive sleep apnea (adult) (pediatric): Secondary | ICD-10-CM

## 2021-07-19 DIAGNOSIS — R0609 Other forms of dyspnea: Secondary | ICD-10-CM

## 2021-07-19 DIAGNOSIS — J301 Allergic rhinitis due to pollen: Secondary | ICD-10-CM | POA: Diagnosis not present

## 2021-07-19 DIAGNOSIS — R0600 Dyspnea, unspecified: Secondary | ICD-10-CM | POA: Diagnosis not present

## 2021-07-19 NOTE — Progress Notes (Signed)
Subjective:    Patient ID: [name], male    DOB: 17-Dec-1941, 80 y.o.   MRN: [id number]  Shortness of Breath Pertinent negatives include no ear pain, fever, headaches, leg swelling, rash, rhinorrhea, sore throat, vomiting or wheezing.    ROV 04/19/21 --80 year old man with mild COPD, OSA, allergic rhinitis.  He had COVID-19 pneumonia in 08/2020.  Other past medical history significant for atrial fibrillation, CAD. Has been managed on Stiolto.  Has albuterol and uses both HFA and nebs. HFA 2x a day, nebs about 3x a week.  I treated him with prednisone and doxycycline 03/20/2021 when he called concerned about increased dyspnea, wheeze, productive cough of clear sputum.  He started these but then was admitted 03/25/2021 with atrial fibrillation.  His metoprolol was increased and he will follow with Dr. Einar Gip.  He is interested in getting back on a CPAP  ROV 07/19/21 --[name] is an 80 year old gentleman with a history of mild COPD and OSA.  Also allergic rhinitis.  He had COVID-19 pneumonia in September 2021.  PMH: Ascending aortic aneurysm, colon cancer, GERD, atrial fibrillation, CAD, had to be admitted in April 2022 with poorly controlled A. fib.  He was cardioverted 05/23/2021, then underwent atrial septal defect repair 06/13/2021.  He is on antibiotic prophylaxis, plan for 6 months postrepair Currently managed on Stiolto, Mucinex 600 mg as needed, Flonase.  Uses albuterol HFA approximately 1-2x a day, helps some but not a lot. Uses albuterol nebs 2x a week, helps him w congestion. Clears some thick white.  Reports that his SOB did improve some, energy level did improve some after the shunt repair. He still has some SOB that can bother him at rest or w exertion. Better w several deep breaths. No cough or wheeze.  He was able to get back on his CPAP, using about 4h a night. Nasal mask. Having a lot of dry mouth with it.     Review of Systems  Constitutional:  Negative for fatigue, fever and  unexpected weight change.  HENT:  Negative for congestion, dental problem, ear pain, nosebleeds, postnasal drip, rhinorrhea, sinus pressure, sneezing, sore throat and trouble swallowing.   Eyes:  Negative for redness and itching.  Respiratory:  Positive for shortness of breath. Negative for cough, chest tightness and wheezing.   Cardiovascular:  Negative for palpitations and leg swelling.  Gastrointestinal:  Negative for nausea and vomiting.  Genitourinary:  Negative for dysuria.  Musculoskeletal:  Negative for joint swelling and myalgias.  Skin:  Negative for rash.  Neurological:  Negative for headaches.  Hematological:  Does not bruise/bleed easily.  Psychiatric/Behavioral:  Negative for dysphoric mood. The patient is not nervous/anxious.       Objective:   Physical Exam Vitals:   07/19/21 1158  BP: 102/64  Pulse: 71  Temp: (!) 97.5 F (36.4 C)  TempSrc: Oral  SpO2: 97%  Weight: 241 lb (109.3 kg)  Height: '5\' 11"'$  (1.803 m)   Gen: Pleasant, obese, in no distress,  normal affect  ENT: No lesions,  mouth clear,  oropharynx clear, no postnasal drip  Neck: No JVD, no stridor  Lungs: No use of accessory muscles, no wheeze, decreased at bases  Cardiovascular:  regular, no M. Trace ankle edema.   Musculoskeletal: amputation R index finger, no cyanosis or clubbing  Neuro: alert, non focal  Skin: Warm, no lesions or rashes     Assessment & Plan:  Dyspnea on exertion Multifactorial shortness of breath that I think was due to  his COPD and also probably his atrial fibrillation, intra-atrial shunting.  He did feel a significant improvement in his fatigue, dyspnea after his shunt repair.  He notes some shortness of breath at rest that gets better with deep breaths.  Likely due to restrictive physiology.  I discussed this with him today.  COPD (chronic obstructive pulmonary disease) (HCC) Overall stable although he does have episodes of difficulty clearing thick white mucus a few  times a week.  He usually uses his nebulized albuterol at these times with some benefit.  Talk to him about adding back Mucinex if this progresses.  No exacerbations, prednisone.  He wonders whether the most recent prednisone was what caused him to go into atrial fibrillation in April.  We will need to remember this if we are ever considering giving him prednisone again.  Plan to continue Stiolto, albuterol as needed.  He is COVID vaccinated  Allergic rhinitis Continue same regimen  Obstructive sleep apnea Since last visit he is back on his CPAP, using about 4 hours each evening.  He does feel that he is getting clinical benefit with more energy, less shortness of breath, better sleep quality.  He is having dry mouth, is using the humidity.  He needs CPAP equipment but cannot get this through [facility] due to his insurance.  He is going to work on finding a new DME.  Time spent 34 minutes  Baltazar Apo, MD, PhD 07/19/2021, 12:26 PM [facility] [phone] or if no answer [phone]

## 2021-07-19 NOTE — Patient Instructions (Signed)
Please continue Stiolto 2 puffs once daily. Use your albuterol either 2 puffs or 1 nebulizer treatment as you needed for shortness of breath, chest tightness, wheezing, mucus clearance If you begin to have more difficulty with cough and mucus then consider using your Mucinex 600 mg up to every 12 hours. Continue Flonase nasal spray as you have been using it Congratulations on restarting your CPAP.  Please continue this every night.  Let us know when you have found an alternative medical equipment company so we can send an order to get you CPAP supplies. Follow with Dr Lamonte Sakai in 6 months or sooner if you have any problems

## 2021-07-19 NOTE — Assessment & Plan Note (Signed)
Since last visit he is back on his CPAP, using about 4 hours each evening.  He does feel that he is getting clinical benefit with more energy, less shortness of breath, better sleep quality.  He is having dry mouth, is using the humidity.  He needs CPAP equipment but cannot get this through [facility] due to his insurance.  He is going to work on finding a new DME.

## 2021-07-19 NOTE — Assessment & Plan Note (Signed)
Continue same regimen 

## 2021-07-19 NOTE — Assessment & Plan Note (Signed)
Overall stable although he does have episodes of difficulty clearing thick white mucus a few times a week.  He usually uses his nebulized albuterol at these times with some benefit.  Talk to him about adding back Mucinex if this progresses.  No exacerbations, prednisone.  He wonders whether the most recent prednisone was what caused him to go into atrial fibrillation in April.  We will need to remember this if we are ever considering giving him prednisone again.  Plan to continue Stiolto, albuterol as needed.  He is COVID vaccinated

## 2021-07-19 NOTE — Assessment & Plan Note (Signed)
Multifactorial shortness of breath that I think was due to his COPD and also probably his atrial fibrillation, intra-atrial shunting.  He did feel a significant improvement in his fatigue, dyspnea after his shunt repair.  He notes some shortness of breath at rest that gets better with deep breaths.  Likely due to restrictive physiology.  I discussed this with him today.

## 2021-07-21 ENCOUNTER — Other Ambulatory Visit: Payer: Self-pay | Admitting: Student

## 2021-08-07 DIAGNOSIS — I10 Essential (primary) hypertension: Secondary | ICD-10-CM | POA: Diagnosis not present

## 2021-08-07 DIAGNOSIS — N1831 Chronic kidney disease, stage 3a: Secondary | ICD-10-CM | POA: Diagnosis not present

## 2021-08-07 DIAGNOSIS — G8929 Other chronic pain: Secondary | ICD-10-CM | POA: Diagnosis not present

## 2021-08-07 DIAGNOSIS — E7800 Pure hypercholesterolemia, unspecified: Secondary | ICD-10-CM | POA: Diagnosis not present

## 2021-08-07 DIAGNOSIS — I2510 Atherosclerotic heart disease of native coronary artery without angina pectoris: Secondary | ICD-10-CM | POA: Diagnosis not present

## 2021-08-31 ENCOUNTER — Ambulatory Visit: Payer: Medicare PPO | Admitting: Student

## 2021-09-08 ENCOUNTER — Other Ambulatory Visit: Payer: Self-pay | Admitting: Student

## 2021-09-13 ENCOUNTER — Encounter: Payer: Medicare PPO | Admitting: Surgery

## 2021-09-13 ENCOUNTER — Other Ambulatory Visit: Payer: Medicare PPO

## 2021-09-14 ENCOUNTER — Other Ambulatory Visit: Payer: Self-pay

## 2021-09-14 ENCOUNTER — Ambulatory Visit: Payer: Medicare PPO | Admitting: Surgical

## 2021-09-14 ENCOUNTER — Ambulatory Visit
Admission: RE | Admit: 2021-09-14 | Discharge: 2021-09-14 | Disposition: A | Discharge status: Home / Self Care | Payer: Medicare PPO | Source: Ambulatory Visit | Attending: Surgery | Admitting: Surgery

## 2021-09-14 VITALS — BP 161/79 | HR 82 | Resp 20 | Ht 71.0 in | Wt 243.2 lb

## 2021-09-14 DIAGNOSIS — J984 Other disorders of lung: Secondary | ICD-10-CM | POA: Diagnosis not present

## 2021-09-14 DIAGNOSIS — I712 Thoracic aortic aneurysm, without rupture, unspecified: Secondary | ICD-10-CM

## 2021-09-14 DIAGNOSIS — J8410 Pulmonary fibrosis, unspecified: Secondary | ICD-10-CM | POA: Diagnosis not present

## 2021-09-14 DIAGNOSIS — I2510 Atherosclerotic heart disease of native coronary artery without angina pectoris: Secondary | ICD-10-CM | POA: Diagnosis not present

## 2021-09-14 MED ORDER — IOPAMIDOL (ISOVUE-370) INJECTION 76%
75.0000 mL | Freq: Once | INTRAVENOUS | Status: AC | PRN
  Administered 2021-09-14: 75 mL via INTRAVENOUS

## 2021-09-14 NOTE — Patient Instructions (Addendum)
Continue lifestyle modifications for aneurysmal disease

## 2021-09-14 NOTE — Progress Notes (Signed)
Subjective:    Patient ID: [name], male    DOB: 09-02-1941, 80 y.o.   MRN: [id number]  Chief Complaint  Patient presents with   Thoracic Aortic Aneurysm    2 year f/u with CTA Chest    HPI Patient is in today for thoracic aneurysm surveillance.  In general patient feels as though he is doing well.  He does describe occasional shortness of breath but no recent chest pain.  Past Medical History:  Diagnosis Date   Allergy    Arthritis    BPH (benign prostatic hyperplasia)    CAD (coronary artery disease)    s/p CABG 2007; NSTEMI in setting of AFib with RVR 12/2009; cath 1/11: S-RCA ok with prox 30-40% and 50% stenoses; S-OM and RI  with patent OM limb but an occluded RI limb; S-D2 and L-LAD ok; EF 55%   Cataract    removed both eyes    Clotting disorder (HCC)    s/p CABG x 5- PE    Colon cancer ([place]) 1992   COPD (chronic obstructive pulmonary disease) ([place])    Diverticulosis    Essential hypertension    Family history of breast cancer    Family history of colon cancer    Family history of lung cancer    GERD (gastroesophageal reflux disease)    History of pulmonary embolus (PE) 2007   Following CABG   Hyperlipidemia    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[place])    Right upper   OSA on CPAP    Paroxysmal atrial fibrillation ([place])    Previously on Multaq, declines anticoagulation   Personal history of colonic polyps 01/22/2000   Tubular adenoma   RBBB (right bundle branch block)    Sleep apnea    no cpap    Squamous cell carcinoma of skin 02/11/2008   [place]-Left paraspinal, lower    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   in situ-left forearm    Past Surgical History:  Procedure Laterality Date   CARDIOVERSION N/A 05/23/2021   Procedure: CARDIOVERSION;  Surgeon: Nigel Mormon, MD;  Location: [facility];  Service: Cardiovascular;  Laterality: N/A;   COLECTOMY  1992   w/ colostomy  related to colon cancer   [place]   to take down Colostomy    COLONOSCOPY     CORONARY ARTERY BYPASS GRAFT  2007   CORONARY STENT INTERVENTION N/A 10/07/2018   Procedure: CORONARY STENT INTERVENTION;  Surgeon: Adrian Prows, MD;  Location: [facility];  Service: Cardiovascular;  Laterality: N/A;   HAND [place]   tendon repair and nerve; left   INGUINAL HERNIA REPAIR  2009   LAPAROSCOPIC CHOLECYSTECTOMY  2004   PATENT FORAMEN OVALE(PFO) CLOSURE N/A 06/13/2021   Procedure: PATENT FORAMEN OVALE (PFO) CLOSURE;  Surgeon: Adrian Prows, MD;  Location: [facility];  Service: Cardiovascular;  Laterality: N/A;   POLYPECTOMY     RIGHT HEART CATH N/A 06/13/2021   Procedure: RIGHT HEART CATH;  Surgeon: Adrian Prows, MD;  Location: [facility];  Service: Cardiovascular;  Laterality: N/A;   RIGHT/LEFT HEART CATH AND CORONARY/GRAFT ANGIOGRAPHY N/A 07/03/2017   Procedure: Right/Left Heart Cath and Coronary/Graft Angiography;  Surgeon: Leonie Man, MD;  Location: [facility];  Service: Cardiovascular;  Laterality: N/A;   RIGHT/LEFT HEART CATH AND CORONARY/GRAFT ANGIOGRAPHY N/A 10/07/2018   Procedure: RIGHT/LEFT HEART CATH AND CORONARY/GRAFT ANGIOGRAPHY;  Surgeon: Adrian Prows, MD;  Location: [facility];  Service: Cardiovascular;  Laterality: N/A;   TEE WITHOUT CARDIOVERSION  05/23/2021   Procedure: TRANSESOPHAGEAL ECHOCARDIOGRAM (TEE);  Surgeon: Nigel Mormon, MD;  Location: [facility];  Service: Cardiovascular;;   UMBILICAL HERNIA REPAIR     VENTRAL HERNIA REPAIR  2009    Family History  Problem Relation Age of Onset   Lung cancer Mother    Heart disease Mother    Emphysema Father    Heart disease Father    Cancer Sister 53       unknown type   Breast cancer Sister        dx. >50   Colon cancer Sister        dx. >50   Breast cancer Sister        dx. >50   Colon cancer Sister        dx. >50   Colon cancer Cousin        dx. early 83s   Esophageal cancer Neg Hx    Rectal cancer Neg Hx     Stomach cancer Neg Hx    Colon polyps Neg Hx     Social History   Socioeconomic History   Marital status: Married    Spouse name: Not on file   Number of children: 4   Years of education: Not on file   Highest education level: Not on file  Occupational History   Occupation: Retired    Comment: Surveyor, minerals   Tobacco Use   Smoking status: Former    Packs/day: 1.00    Years: 45.00    Pack years: 45.00    Types: Cigarettes    Start date: 1955    Quit date: 12/18/1995    Years since quitting: 25.7   Smokeless tobacco: Never  Vaping Use   Vaping Use: Never used  Substance and Sexual Activity   Alcohol use: No    Comment: quit in 1980   Drug use: No   Sexual activity: Not on file  Other Topics Concern   Not on file  Social History Narrative   Daily caffeine    Social Determinants of Health   Financial Resource Strain: Not on file  Food Insecurity: Not on file  Transportation Needs: Not on file  Physical Activity: Not on file  Stress: Not on file  Social Connections: Not on file  Intimate Partner Violence: Not on file    Outpatient Medications Prior to Visit  Medication Sig Dispense Refill   albuterol (PROVENTIL HFA;VENTOLIN HFA) 108 (90 Base) MCG/ACT inhaler Inhale 2 puffs into the lungs every 4 (four) hours as needed for wheezing or shortness of breath. 1 Inhaler 5   albuterol (PROVENTIL) (2.5 MG/3ML) 0.083% nebulizer solution USE 1 VIAL IN NEBULIZER EVERY 6 HOURS AS NEEDED FOR WHEEZING OR SHORTNESS OF BREATH 75 mL 3   atorvastatin (LIPITOR) 20 MG tablet Take 20 mg by mouth at bedtime.   5   clonazePAM (KLONOPIN) 1 MG tablet Take 1 mg by mouth at bedtime as needed (sleep).     ELIQUIS 5 MG TABS tablet Take 1 tablet by mouth twice daily 180 tablet 1   fluticasone (FLONASE) 50 MCG/ACT nasal spray Place 2 sprays into both nostrils daily. 16 g 2   gabapentin (NEURONTIN) 100 MG capsule Take 100-200 mg by mouth See admin instructions. Take 200 mg am & 100 mg at  bedtime     guaiFENesin (MUCINEX) 600 MG 12 hr tablet Take 600 mg by mouth daily as  needed (bronchitis).     HYDROcodone-acetaminophen (NORCO/VICODIN) 5-325 MG tablet Take 1 tablet by mouth every 6 (six) hours as needed for moderate pain.     isosorbide mononitrate (IMDUR) 30 MG 24 hr tablet Take 1 tablet by mouth once daily 90 tablet 1   Magnesium 200 MG TABS Take 200 mg by mouth in the morning.     metoprolol tartrate (LOPRESSOR) 50 MG tablet Take 1 tablet (50 mg total) by mouth 2 (two) times daily. 180 tablet 3   Multiple Vitamin (MULTIVITAMIN WITH MINERALS) TABS tablet Take 1 tablet by mouth in the morning.     nitroGLYCERIN (NITROSTAT) 0.4 MG SL tablet Place 1 tablet (0.4 mg total) under the tongue every 5 (five) minutes as needed for chest pain. 25 tablet 3   Omega-3 Fatty Acids (FISH OIL) 1000 MG CAPS Take 1,000 mg by mouth in the morning.     omeprazole (PRILOSEC) 20 MG capsule Take 1 capsule (20 mg total) by mouth daily. 30 capsule 3   Phenylephrine-DM-GG (ROBITUSSIN COUGH/COLD CF MAX PO) Take 20 mLs by mouth every 6 (six) hours as needed (cough).     Potassium Chloride ER 20 MEQ TBCR Take 20 mEq by mouth in the morning.     STIOLTO RESPIMAT 2.5-2.5 MCG/ACT AERS INHALE 2 PUFFS BY MOUTH ONCE DAILY 4 g 5   tamsulosin (FLOMAX) 0.4 MG CAPS capsule Take 0.8 mg by mouth at bedtime.     tiZANidine (ZANAFLEX) 4 MG tablet Take 4 mg by mouth at bedtime as needed for muscle spasms.     torsemide (DEMADEX) 20 MG tablet Take 40 mg by mouth in the morning.     No facility-administered medications prior to visit.    Allergies  Allergen Reactions   Morphine Sulfate Itching   Rosuvastatin Calcium Other (See Comments)    Muscle weakness   Ultram [Tramadol] Itching         Objective:    Vitals:   09/14/21 1432  BP: (!) 161/79  Pulse: 82  Resp: 20  SpO2: 94%    Exam General alert and no acute distress Cardiac regular rate and rhythm without murmurs gallops or rubs  Lungs clear lung  fields Abdomen benign exam Ext no clubbing, cyanosis or edema  There were no vitals taken for this visit. Wt Readings from Last 3 Encounters:  07/19/21 241 lb (109.3 kg)  06/27/21 242 lb (109.8 kg)  06/13/21 230 lb (104.3 kg)    Health Maintenance Due  Topic Date Due   COVID-19 Vaccine (4 - Booster for Moderna series) 04/18/2021   INFLUENZA VACCINE  07/17/2021    There are no preventive care reminders to display for this patient.   Lab Results  Component Value Date   TSH 0.630 03/25/2021   Lab Results  Component Value Date   WBC 6.0 06/08/2021   HGB 13.9 06/13/2021   HCT 41.0 06/13/2021   MCV 84 06/08/2021   PLT 209 06/08/2021   Lab Results  Component Value Date   NA 140 06/13/2021   K 3.8 06/13/2021   CO2 23 06/08/2021   GLUCOSE 89 06/08/2021   BUN 11 06/08/2021   CREATININE 1.18 06/08/2021   BILITOT 0.5 03/25/2021   ALKPHOS 54 03/25/2021   AST 28 03/25/2021   ALT 34 03/25/2021   PROT 7.2 03/25/2021   ALBUMIN 3.7 03/25/2021   CALCIUM 9.4 06/08/2021   ANIONGAP 9 03/26/2021   EGFR 62 06/08/2021   GFR 64.75 06/03/2018   Lab Results  Component Value Date   CHOL 134 09/08/2015   Lab Results  Component Value Date   HDL 36.10 (L) 09/08/2015   Lab Results  Component Value Date   LDLCALC 59 07/23/2014   Lab Results  Component Value Date   TRIG 211.0 (H) 09/08/2015   Lab Results  Component Value Date   CHOLHDL 4 09/08/2015   No results found for: HGBA1C  CT ANGIO CHEST AORTA W/CM & OR WO/CM  Result Date: 09/14/2021 CLINICAL DATA:  Thoracic aortic aneurysm (TAA), follow up EXAM: CT ANGIOGRAPHY CHEST WITH CONTRAST TECHNIQUE: Multidetector CT imaging of the chest was performed using the standard protocol during bolus administration of intravenous contrast. Multiplanar CT image reconstructions and MIPs were obtained to evaluate the vascular anatomy. CONTRAST:  48m ISOVUE-370 IOPAMIDOL (ISOVUE-370) INJECTION 76% COMPARISON:  09/16/2019 FINDINGS:  Cardiovascular: Heart size is normal. No pericardial effusion. Thoracic aorta is well opacified. Mid ascending thoracic aorta measures up to 4.4 cm in diameter (previously measured 4.4 cm on 09/16/2019). No aortic dissection. Three-vessel arch. There is tortuosity of the descending thoracic aorta without aneurysm. There are atherosclerotic calcifications throughout the thoracic aorta and coronary arteries. Central pulmonary vasculature is within normal limits. No central filling defects. Prior median sternotomy and CABG. Mediastinum/Nodes: No enlarged mediastinal, hilar, or axillary lymph nodes. Thyroid gland, trachea, and esophagus demonstrate no significant findings. Lungs/Pleura: Biapical pleuroparenchymal scarring. Calcified granuloma in the right upper lobe. Lungs are clear. No focal consolidation, pleural effusion, or pneumothorax. Upper Abdomen: No acute abnormality. Musculoskeletal: No chest wall abnormality. No acute or significant osseous findings. Review of the MIP images confirms the above findings. IMPRESSION: 1. Mid ascending thoracic aorta measures up to 4.4 cm in diameter (previously measured 4.4 cm on 09/16/2019). No aortic dissection. Recommend annual imaging followup by CTA or MRA. This recommendation follows 2010 ACCF/AHA/AATS/ACR/ASA/SCA/SCAI/SIR/STS/SVM Guidelines for the Diagnosis and Management of Patients with Thoracic Aortic Disease. Circulation. 2010; 121:: Z660-Y301 Aortic aneurysm NOS (ICD10-I71.9) 2. Aortic and coronary artery atherosclerosis (ICD10-I70.0). Electronically Signed   By: NDavina PokeD.O.   On: 09/14/2021 11:13          Assessment & Plan:   Problem List Items Addressed This Visit     Aortic aneurysm ([place] - Primary   Aneurysm report as described above.  It is very stable. Will repeat study in 1 year. He knows to keep close monitoring of his blood pressure.  Today's blood pressure in the office he reports is higher and then which is typical.  He will  continue to monitor and follow-up with primary/cardiology Other lifestyle modifications discussed with patient   No orders of the defined types were placed in this encounter.    WJohn Giovanni PA-C

## 2021-09-27 ENCOUNTER — Ambulatory Visit: Payer: Medicare PPO | Admitting: Student

## 2021-09-27 ENCOUNTER — Other Ambulatory Visit: Payer: Self-pay

## 2021-09-27 ENCOUNTER — Encounter: Payer: Self-pay | Admitting: Student

## 2021-09-27 VITALS — BP 119/69 | HR 83 | Temp 97.7°F | Ht 71.0 in | Wt 242.0 lb

## 2021-09-27 DIAGNOSIS — R600 Localized edema: Secondary | ICD-10-CM

## 2021-09-27 DIAGNOSIS — I480 Paroxysmal atrial fibrillation: Secondary | ICD-10-CM | POA: Diagnosis not present

## 2021-09-27 DIAGNOSIS — I25810 Atherosclerosis of coronary artery bypass graft(s) without angina pectoris: Secondary | ICD-10-CM | POA: Diagnosis not present

## 2021-09-27 NOTE — Progress Notes (Signed)
Primary Physician:  Shirline Frees, MD   Patient ID: [name], male    DOB: November 08, 1941, 80 y.o.   MRN: [id number]  Subjective:    Chief Complaint  Patient presents with   Coronary Artery Disease   Follow-up    HPI: [name]  is a 80 y.o. male  with known coronary artery disease and CABG in 2007 in the setting of non-STEMI in atrial fibrillation with RVR. He has a history of  45 pack year h/o smoking tobacco, ascending aortic aneurysm (follows Dr.Bryan Bartle), small abdominal aortic aneurysm noted in 2015 (3cm), H/O colon cancer, in remission since 1992, GERD, hyperlipidemia, obstructive sleep apnea now on CPAP.   Patient hospitalized 03/2021 with recurrence of atrial flutter and echocardiogram revealed new onset LVEF 35-40% with global hypokinesis. Patient underwent successful cardioversion 05/23/2021 with return to normal sinus rhythm.  TEE prior to cardioversion noted ASD, which was subsequently repaired by Dr. Einar Gip 06/13/2021.  Patient presents for 13-month follow-up.  Last office visit patient was advised to transition off of Plavix after 4 weeks and aspirin after 8 weeks, he is presently on Eliquis alone.  Last office visit patient was stable and no changes otherwise were made to his medications.  He is tolerating anticoagulation without bleeding diathesis.Denies chest pain, palpitations, syncope, near syncope, dizziness, orthopnea, PND, leg swelling.  Denies symptoms suggestive of TIA/CVA.   Patient's primary concern is intermittent bilateral leg edema, which he feels is occurring more frequently over the last several weeks.  Patient states it is worse in the afternoons.  He is currently taking torsemide 40 mg once daily.  He does also have mild dyspnea on exertion which is stable.  Past Medical History:  Diagnosis Date   Allergy    Arthritis    BPH (benign prostatic hyperplasia)    CAD (coronary artery disease)    s/p CABG 2007; NSTEMI in setting of AFib with RVR  12/2009; cath 1/11: S-RCA ok with prox 30-40% and 50% stenoses; S-OM and RI  with patent OM limb but an occluded RI limb; S-D2 and L-LAD ok; EF 55%   Cataract    removed both eyes    Clotting disorder (HCC)    s/p CABG x 5- PE    Colon cancer ([place]) 1992   COPD (chronic obstructive pulmonary disease) ([place])    Diverticulosis    Essential hypertension    Family history of breast cancer    Family history of colon cancer    Family history of lung cancer    GERD (gastroesophageal reflux disease)    History of pulmonary embolus (PE) 2007   Following CABG   Hyperlipidemia    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HCC)    Right upper   OSA on CPAP    Paroxysmal atrial fibrillation (HCC)    Previously on Multaq, declines anticoagulation   Personal history of colonic polyps 01/22/2000   Tubular adenoma   RBBB (right bundle branch block)    Sleep apnea    no cpap    Squamous cell carcinoma of skin 02/11/2008   [place]-Left paraspinal, lower    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   in situ-left forearm   Past Surgical History:  Procedure Laterality Date   CARDIOVERSION N/A 05/23/2021   Procedure: CARDIOVERSION;  Surgeon: Nigel Mormon, MD;  Location: [facility];  Service: Cardiovascular;  Laterality: N/A;   COLECTOMY  1992   w/ colostomy  related to colon cancer   [place]  to take down Colostomy   COLONOSCOPY     CORONARY ARTERY BYPASS GRAFT  2007   CORONARY STENT INTERVENTION N/A 10/07/2018   Procedure: CORONARY STENT INTERVENTION;  Surgeon: Adrian Prows, MD;  Location: [place] [facility];  Service: Cardiovascular;  Laterality: N/A;   HAND [place]   tendon repair and nerve; left   INGUINAL HERNIA REPAIR  2009   LAPAROSCOPIC CHOLECYSTECTOMY  2004   PATENT FORAMEN OVALE(PFO) CLOSURE N/A 06/13/2021   Procedure: PATENT FORAMEN OVALE (PFO) CLOSURE;  Surgeon: Adrian Prows, MD;  Location: [place] [facility];  Service: Cardiovascular;  Laterality: N/A;    POLYPECTOMY     RIGHT HEART CATH N/A 06/13/2021   Procedure: RIGHT HEART CATH;  Surgeon: Adrian Prows, MD;  Location: [place] [facility];  Service: Cardiovascular;  Laterality: N/A;   RIGHT/LEFT HEART CATH AND CORONARY/GRAFT ANGIOGRAPHY N/A 07/03/2017   Procedure: Right/Left Heart Cath and Coronary/Graft Angiography;  Surgeon: Leonie Man, MD;  Location: [place] [facility];  Service: Cardiovascular;  Laterality: N/A;   RIGHT/LEFT HEART CATH AND CORONARY/GRAFT ANGIOGRAPHY N/A 10/07/2018   Procedure: RIGHT/LEFT HEART CATH AND CORONARY/GRAFT ANGIOGRAPHY;  Surgeon: Adrian Prows, MD;  Location: [place] [facility];  Service: Cardiovascular;  Laterality: N/A;   TEE WITHOUT CARDIOVERSION  05/23/2021   Procedure: TRANSESOPHAGEAL ECHOCARDIOGRAM (TEE);  Surgeon: Nigel Mormon, MD;  Location: [facility];  Service: Cardiovascular;;   UMBILICAL HERNIA REPAIR     VENTRAL HERNIA REPAIR  2009   Family History  Problem Relation Age of Onset   Lung cancer Mother    Heart disease Mother    Emphysema Father    Heart disease Father    Cancer Sister 46       unknown type   Breast cancer Sister        dx. >50   Colon cancer Sister        dx. >50   Breast cancer Sister        dx. >50   Colon cancer Sister        dx. >50   Colon cancer Cousin        dx. early 1s   Esophageal cancer Neg Hx    Rectal cancer Neg Hx    Stomach cancer Neg Hx    Colon polyps Neg Hx    Social History   Tobacco Use   Smoking status: Former    Packs/day: 1.00    Years: 45.00    Pack years: 45.00    Types: Cigarettes    Start date: 1955    Quit date: 12/18/1995    Years since quitting: 25.7   Smokeless tobacco: Never  Substance Use Topics   Alcohol use: No    Comment: quit in 1980   Marital Status: Married   ROS   Review of Systems  Constitutional: Negative for malaise/fatigue (resolved) and weight gain.  Cardiovascular:  Positive for leg swelling. Negative for chest pain, claudication, dyspnea on exertion  (improved), near-syncope, orthopnea, palpitations, paroxysmal nocturnal dyspnea and syncope.  Musculoskeletal:  Positive for back pain and joint pain (knee).  Neurological:  Negative for dizziness.  Objective:  Blood pressure 119/69, pulse 83, temperature 97.7 F (36.5 C), temperature source Temporal, height 5\' 11"  (1.803 m), weight 242 lb (109.8 kg). Body mass index is 33.75 kg/m.  Vitals with BMI 09/27/2021 09/14/2021 07/19/2021  Height 5\' 11"  5\' 11"  5\' 11"   Weight 242 lbs 243 lbs 3 oz 241 lbs  BMI 33.77  35.57 32.20  Systolic 254 270 623  Diastolic 69 79 64  Pulse 83 82 71    Physical Exam Vitals reviewed.  Constitutional:      Appearance: He is well-developed.  Cardiovascular:     Rate and Rhythm: Normal rate and regular rhythm.     Pulses: Intact distal pulses.          Femoral pulses are 2+ on the right side and 2+ on the left side.      Popliteal pulses are 1+ on the right side and 1+ on the left side.       Dorsalis pedis pulses are 1+ on the right side and 1+ on the left side.       Posterior tibial pulses are 2+ on the right side and 2+ on the left side.     Heart sounds: Normal heart sounds, S1 normal and S2 normal. No murmur heard.   No gallop.  Pulmonary:     Effort: Pulmonary effort is normal. No accessory muscle usage or respiratory distress.     Breath sounds: Normal breath sounds. No wheezing, rhonchi or rales.  Musculoskeletal:     Right lower leg: No edema.     Left lower leg: No edema.  Skin:    General: Skin is warm and dry.  Neurological:     Mental Status: He is alert.  Physical exam unchanged compared to last office visit.  Laboratory examination:   CMP Latest Ref Rng & Units 06/13/2021 06/13/2021 06/13/2021  Glucose 65 - 99 mg/dL - - -  BUN 8 - 27 mg/dL - - -  Creatinine 0.76 - 1.27 mg/dL - - -  Sodium 135 - 145 mmol/L 140 141 140  Potassium 3.5 - 5.1 mmol/L 3.8 3.8 3.7  Chloride 96 - 106 mmol/L - - -  CO2 20 - 29 mmol/L - - -  Calcium 8.6 - 10.2  mg/dL - - -  Total Protein 6.5 - 8.1 g/dL - - -  Total Bilirubin 0.3 - 1.2 mg/dL - - -  Alkaline Phos 38 - 126 U/L - - -  AST 15 - 41 U/L - - -  ALT 0 - 44 U/L - - -   CBC Latest Ref Rng & Units 06/13/2021 06/13/2021 06/13/2021  WBC 3.4 - 10.8 x10E3/uL - - -  Hemoglobin 13.0 - 17.0 g/dL 13.9 13.9 13.6  Hematocrit 39.0 - 52.0 % 41.0 41.0 40.0  Platelets 150 - 450 x10E3/uL - - -   Lipid Panel     Component Value Date/Time   CHOL 134 09/08/2015 1210   TRIG 211.0 (H) 09/08/2015 1210   HDL 36.10 (L) 09/08/2015 1210   CHOLHDL 4 09/08/2015 1210   VLDL 42.2 (H) 09/08/2015 1210   LDLCALC 59 07/23/2014 1638   LDLDIRECT 87.0 09/08/2015 1210   HEMOGLOBIN A1C No results found for: HGBA1C, MPG TSH Recent Labs    03/25/21 1443  TSH 0.630    External Labs: 08/07/2021: HDL 36, LDL 49, total cholesterol 109, triglycerides 134 A1c 6.4% BUN 15, creatinine 1.3, GFR 55 TSH 0.63  Cholesterol, total 128.000 m 06/22/2020 HDL 34.000 mg 06/22/2020 LDL-C 63.000 mg 06/22/2020 Triglycerides 183.000 m 06/22/2020  Hemoglobin 14.900 g/d 06/22/2020  Creatinine, Serum 1.250 mg/ 06/22/2020 Potassium 3.900 mm 06/22/2020 ALT (SGPT) 30.000 U/L 06/22/2020  TSH 2.120 06/22/2020   Allergies   Allergies  Allergen Reactions   Morphine Sulfate Itching   Rosuvastatin Calcium Other (See Comments)    Muscle weakness   Ultram [  Tramadol] Itching    Medications Prior to Visit:   Outpatient Medications Prior to Visit  Medication Sig Dispense Refill   albuterol (PROVENTIL HFA;VENTOLIN HFA) 108 (90 Base) MCG/ACT inhaler Inhale 2 puffs into the lungs every 4 (four) hours as needed for wheezing or shortness of breath. 1 Inhaler 5   albuterol (PROVENTIL) (2.5 MG/3ML) 0.083% nebulizer solution USE 1 VIAL IN NEBULIZER EVERY 6 HOURS AS NEEDED FOR WHEEZING OR SHORTNESS OF BREATH 75 mL 3   atorvastatin (LIPITOR) 20 MG tablet Take 20 mg by mouth at bedtime.   5   clonazePAM (KLONOPIN) 1 MG tablet Take 1 mg by mouth at bedtime as  needed (sleep).     ELIQUIS 5 MG TABS tablet Take 1 tablet by mouth twice daily 180 tablet 1   fluticasone (FLONASE) 50 MCG/ACT nasal spray Place 2 sprays into both nostrils daily. 16 g 2   gabapentin (NEURONTIN) 100 MG capsule Take 100-200 mg by mouth See admin instructions. Take 200 mg am & 100 mg at bedtime     guaiFENesin (MUCINEX) 600 MG 12 hr tablet Take 600 mg by mouth daily as needed (bronchitis).     HYDROcodone-acetaminophen (NORCO/VICODIN) 5-325 MG tablet Take 1 tablet by mouth every 6 (six) hours as needed for moderate pain.     isosorbide mononitrate (IMDUR) 30 MG 24 hr tablet Take 1 tablet by mouth once daily 90 tablet 1   Magnesium 200 MG TABS Take 200 mg by mouth in the morning.     metoprolol tartrate (LOPRESSOR) 50 MG tablet Take 1 tablet (50 mg total) by mouth 2 (two) times daily. 180 tablet 3   Multiple Vitamin (MULTIVITAMIN WITH MINERALS) TABS tablet Take 1 tablet by mouth in the morning.     nitroGLYCERIN (NITROSTAT) 0.4 MG SL tablet Place 1 tablet (0.4 mg total) under the tongue every 5 (five) minutes as needed for chest pain. 25 tablet 3   Omega-3 Fatty Acids (FISH OIL) 1000 MG CAPS Take 1,000 mg by mouth in the morning.     omeprazole (PRILOSEC) 20 MG capsule Take 1 capsule (20 mg total) by mouth daily. 30 capsule 3   Phenylephrine-DM-GG (ROBITUSSIN COUGH/COLD CF MAX PO) Take 20 mLs by mouth every 6 (six) hours as needed (cough).     Potassium Chloride ER 20 MEQ TBCR Take 20 mEq by mouth in the morning.     STIOLTO RESPIMAT 2.5-2.5 MCG/ACT AERS INHALE 2 PUFFS BY MOUTH ONCE DAILY 4 g 5   tamsulosin (FLOMAX) 0.4 MG CAPS capsule Take 0.8 mg by mouth at bedtime.     tiZANidine (ZANAFLEX) 4 MG tablet Take 4 mg by mouth at bedtime as needed for muscle spasms.     torsemide (DEMADEX) 20 MG tablet Take 40 mg by mouth in the morning.     No facility-administered medications prior to visit.   Final Medications at End of Visit    Current Meds  Medication Sig   albuterol  (PROVENTIL HFA;VENTOLIN HFA) 108 (90 Base) MCG/ACT inhaler Inhale 2 puffs into the lungs every 4 (four) hours as needed for wheezing or shortness of breath.   albuterol (PROVENTIL) (2.5 MG/3ML) 0.083% nebulizer solution USE 1 VIAL IN NEBULIZER EVERY 6 HOURS AS NEEDED FOR WHEEZING OR SHORTNESS OF BREATH   atorvastatin (LIPITOR) 20 MG tablet Take 20 mg by mouth at bedtime.    clonazePAM (KLONOPIN) 1 MG tablet Take 1 mg by mouth at bedtime as needed (sleep).   ELIQUIS 5 MG TABS tablet Take 1  tablet by mouth twice daily   fluticasone (FLONASE) 50 MCG/ACT nasal spray Place 2 sprays into both nostrils daily.   gabapentin (NEURONTIN) 100 MG capsule Take 100-200 mg by mouth See admin instructions. Take 200 mg am & 100 mg at bedtime   guaiFENesin (MUCINEX) 600 MG 12 hr tablet Take 600 mg by mouth daily as needed (bronchitis).   HYDROcodone-acetaminophen (NORCO/VICODIN) 5-325 MG tablet Take 1 tablet by mouth every 6 (six) hours as needed for moderate pain.   isosorbide mononitrate (IMDUR) 30 MG 24 hr tablet Take 1 tablet by mouth once daily   Magnesium 200 MG TABS Take 200 mg by mouth in the morning.   metoprolol tartrate (LOPRESSOR) 50 MG tablet Take 1 tablet (50 mg total) by mouth 2 (two) times daily.   Multiple Vitamin (MULTIVITAMIN WITH MINERALS) TABS tablet Take 1 tablet by mouth in the morning.   nitroGLYCERIN (NITROSTAT) 0.4 MG SL tablet Place 1 tablet (0.4 mg total) under the tongue every 5 (five) minutes as needed for chest pain.   Omega-3 Fatty Acids (FISH OIL) 1000 MG CAPS Take 1,000 mg by mouth in the morning.   omeprazole (PRILOSEC) 20 MG capsule Take 1 capsule (20 mg total) by mouth daily.   Phenylephrine-DM-GG (ROBITUSSIN COUGH/COLD CF MAX PO) Take 20 mLs by mouth every 6 (six) hours as needed (cough).   Potassium Chloride ER 20 MEQ TBCR Take 20 mEq by mouth in the morning.   STIOLTO RESPIMAT 2.5-2.5 MCG/ACT AERS INHALE 2 PUFFS BY MOUTH ONCE DAILY   tamsulosin (FLOMAX) 0.4 MG CAPS capsule  Take 0.8 mg by mouth at bedtime.   tiZANidine (ZANAFLEX) 4 MG tablet Take 4 mg by mouth at bedtime as needed for muscle spasms.   torsemide (DEMADEX) 20 MG tablet Take 40 mg by mouth in the morning.   Radiology    Chest x-ray 03/25/2021: 1. Low volume film with cardiomegaly and vascular congestion. 2. Patchy airspace disease in the left base compatible with atelectasis or pneumonia. 3. Tiny nodular density peripheral right mid lung is stable, suggesting benign etiology.  CTA of chest 09/16/2019:  4.4 cm ascending thoracic aortic aneurysm. Recommend annual imaging followup by CTA or MRA. This recommendation follows 2010 ACCF/AHA/AATS/ACR/ASA/SCA/SCAI/SIR/STS/SVM Guidelines for the Diagnosis and Management of Patients with Thoracic Aortic Disease. Circulation. 2010; 121: F354-T625. Aortic aneurysm NOS (ICD10-I71.9)   Old granulomatous disease. No acute cardiopulmonary disease. Aortic Atherosclerosis (ICD10-I70.0).  Cardiac Studies:   CABG 2007: LIMA to LAD, SVG to D2, SVG to OM1 and ramus intermediate with OM1 limb occluded, SVG to RCA with a proximal 50% stenosis by coronary angiography in July 2018. Normal LVEF.  Lexiscan sestamibi stress test 12/03/2016: No significant ST-T wave changes during stress test. Medium defect of moderate intensity in the basal inferior, mid inferoseptal, mid inferior and mid inferolateral and apical inferior and apical lateral location consistent with Myocardial infarction mild degree of peri-infarct ischemia. LVEF 52%. Intermediate risk study.  Coronary angiogram 10/08/2018 and had a very high-grade stenosis of a large hiatal D1/ramus branch S/P 3.0 x 23 mm Sierra Xience DES. Patent LIMA to LAD, SVG to D2 and SVG to RCA widely patent. Normal right heart catheterization, preserved cardiac output and cardiac index.  Echocardiogram 03/26/2021: 1. Left ventricular ejection fraction, by estimation, is 35 to 40%. The  left ventricle has moderately decreased  function. The left ventricle  demonstrates global hypokinesis. There is mild left ventricular  hypertrophy. Left ventricular diastolic  parameters are indeterminate.   2. Right ventricular systolic function  is normal. The right ventricular  size is normal.   3. Left atrial size was mildly dilated.   4. The mitral valve is normal in structure. No evidence of mitral valve  regurgitation. No evidence of mitral stenosis.   5. The aortic valve is tricuspid. There is mild calcification of the  aortic valve. Aortic valve regurgitation is trivial. Mild aortic valve  sclerosis is present, with no evidence of aortic valve stenosis.   6. The inferior vena cava is normal in size with greater than 50%  respiratory variability, suggesting right atrial pressure of 3 mmHg.   TEE 05/23/2021:  1. Left ventricular ejection fraction, by estimation, is 35 to 40%. The left ventricle has moderately decreased function. The left ventricle demonstrates global hypokinesis.   2. Right ventricular systolic function is moderately reduced. The right ventricular size is moderately enlarged.   3. Left atrial size was severely dilated. No left atrial/left atrial appendage thrombus was detected.   4. Right atrial size was moderately dilated.   5. The mitral valve is grossly normal. Mild mitral valve regurgitation.   6. The aortic valve is tricuspid. Aortic valve regurgitation is not visualized.   7. Evidence of atrial level shunting detected by color flow Doppler. Septum primum, as well as septum secundum defects seen. 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 Multiple ASD's are noted.   8. No prior TEE for comparison.   EKG:  09/27/2021: Sinus rhythm with first-degree AV block at a rate of 75 bpm.  Normal axis.  Right bundle branch block.  Compared to EKG 06/27/2021, no significant change.  05/03/2021: Atrial fibrillation with rapid ventricular response at a rate of  103 bpm.  Normal axis.  Right bundle branch block, secondary ST-T wave changes.  Compared to EKG 03/30/2021, ventricular rate controlled improved otherwise no significant change.  ED EKG 03/25/2021: Atrial flutter with 4-1 AV block at a rate of 128 bpm.  10/12/2020: Sinus rhythm with  first-degree AV block at rate of 82 bpm, normal axis, right bundle branch block.   Assessment:     ICD-10-CM   1. Paroxysmal atrial fibrillation (HCC)  I48.0 EKG 12-Lead    PCV ECHOCARDIOGRAM COMPLETE    2. Coronary artery disease involving coronary bypass graft of native heart without angina pectoris  I25.810 PCV ECHOCARDIOGRAM COMPLETE    3. Leg edema  R60.0 Brain natriuretic peptide    Basic metabolic panel      No orders of the defined types were placed in this encounter.  There are no discontinued medications.  This patients CHA2DS2-VASc Score 5 (CHF, HTN, vasc, age) and yearly risk of stroke 7.2%.   Recommendations:   [name]  is a 80 y.o. with known coronary artery disease and CABG in 2007 in the setting of non-STEMI in atrial fibrillation with RVR. He has a history of  45 pack year h/o smoking tobacco, ascending aortic aneurysm (follows Dr.Bryan Bartle), small abdominal aortic aneurysm noted in 2015 (3cm), H/O colon cancer, in remission since 1992, GERD, hyperlipidemia, obstructive sleep apnea now on CPAP.   Patient hospitalized 03/2021 with recurrence of atrial flutter and echocardiogram revealed new onset LVEF 35-40% with global hypokinesis. Patient underwent successful cardioversion 05/23/2021 with return to normal sinus rhythm.  TEE prior to cardioversion noted ASD, which was subsequently repaired by Dr. Einar Gip 06/13/2021.  Patient presents for 58-month follow-up.  Patient's primary concern today is worsening intermittent bilateral lower extremity edema.  On  exam patient has no leg swelling and appears euvolemic.  Given history of HFrEF and concern of worsening leg swelling advised patient to  increase torsemide to 40 mg twice daily for the next 3 days.  Will obtain BMP and BNP in 1 week.  We will also repeat echocardiogram at this time.  Patient continues to tolerate anticoagulation without bleeding diathesis and has had no known recurrence of atrial fibrillation/flutter since cardioversion.  Patient's blood pressure is well controlled.  I personally reviewed external labs, lipids are under excellent control diabetes is well controlled.  No changes are made to patient's medications at this time.  Follow-up in 6 months, sooner if needed, for CAD, paroxysmal atrial fibrillation, HFrEF.   Alethia Berthold, PA-C 09/27/2021, 4:57 PM Office: [phone]

## 2021-09-29 ENCOUNTER — Other Ambulatory Visit: Payer: Self-pay

## 2021-09-29 ENCOUNTER — Encounter (HOSPITAL_COMMUNITY): Payer: Self-pay | Admitting: Emergency Medicine

## 2021-09-29 ENCOUNTER — Emergency Department (HOSPITAL_COMMUNITY): Payer: Medicare PPO

## 2021-09-29 ENCOUNTER — Emergency Department (HOSPITAL_COMMUNITY)
Admission: EM | Admit: 2021-09-29 | Discharge: 2021-09-29 | Disposition: A | Discharge status: Home / Self Care | Payer: Medicare PPO | Attending: Emergency Medicine | Admitting: Emergency Medicine

## 2021-09-29 DIAGNOSIS — Z951 Presence of aortocoronary bypass graft: Secondary | ICD-10-CM | POA: Insufficient documentation

## 2021-09-29 DIAGNOSIS — I110 Hypertensive heart disease with heart failure: Secondary | ICD-10-CM | POA: Insufficient documentation

## 2021-09-29 DIAGNOSIS — Z85038 Personal history of other malignant neoplasm of large intestine: Secondary | ICD-10-CM | POA: Diagnosis not present

## 2021-09-29 DIAGNOSIS — I2510 Atherosclerotic heart disease of native coronary artery without angina pectoris: Secondary | ICD-10-CM | POA: Diagnosis not present

## 2021-09-29 DIAGNOSIS — Z85828 Personal history of other malignant neoplasm of skin: Secondary | ICD-10-CM | POA: Insufficient documentation

## 2021-09-29 DIAGNOSIS — M25532 Pain in left wrist: Secondary | ICD-10-CM | POA: Diagnosis not present

## 2021-09-29 DIAGNOSIS — J449 Chronic obstructive pulmonary disease, unspecified: Secondary | ICD-10-CM | POA: Insufficient documentation

## 2021-09-29 DIAGNOSIS — M7989 Other specified soft tissue disorders: Secondary | ICD-10-CM | POA: Diagnosis not present

## 2021-09-29 DIAGNOSIS — Z85118 Personal history of other malignant neoplasm of bronchus and lung: Secondary | ICD-10-CM | POA: Diagnosis not present

## 2021-09-29 DIAGNOSIS — I4891 Unspecified atrial fibrillation: Secondary | ICD-10-CM | POA: Diagnosis not present

## 2021-09-29 DIAGNOSIS — Z87891 Personal history of nicotine dependence: Secondary | ICD-10-CM | POA: Diagnosis not present

## 2021-09-29 DIAGNOSIS — Z79899 Other long term (current) drug therapy: Secondary | ICD-10-CM | POA: Insufficient documentation

## 2021-09-29 DIAGNOSIS — Z7952 Long term (current) use of systemic steroids: Secondary | ICD-10-CM | POA: Insufficient documentation

## 2021-09-29 DIAGNOSIS — Z7901 Long term (current) use of anticoagulants: Secondary | ICD-10-CM | POA: Diagnosis not present

## 2021-09-29 DIAGNOSIS — I5043 Acute on chronic combined systolic (congestive) and diastolic (congestive) heart failure: Secondary | ICD-10-CM | POA: Diagnosis not present

## 2021-09-29 MED ORDER — HYDROMORPHONE HCL 1 MG/ML IJ SOLN
0.5000 mg | Freq: Once | INTRAMUSCULAR | Status: AC
  Administered 2021-09-29: 0.5 mg via INTRAMUSCULAR
  Filled 2021-09-29: qty 1

## 2021-09-29 NOTE — ED Provider Notes (Signed)
[facility] EMERGENCY DEPARTMENT Provider Note   CSN: [id number] Arrival date & time: 09/29/21  0217     History Chief Complaint  Patient presents with   Wrist Pain    [name] is a 80 y.o. male.  The history is provided by the patient and the spouse.  Wrist Pain This is a new problem. The current episode started yesterday. The problem occurs constantly. The problem has been gradually worsening. Pertinent negatives include no chest pain. Exacerbated by: movement and palpation. The symptoms are relieved by rest.  Patient presents with left wrist pain for the past day.  No trauma.  Hurts to move it.  No fevers or chills.  No previous joint surgery on the left wrist.  However he had distant history of surgery in the left hand decades ago    Past Medical History:  Diagnosis Date   Allergy    Arthritis    BPH (benign prostatic hyperplasia)    CAD (coronary artery disease)    s/p CABG 2007; NSTEMI in setting of AFib with RVR 12/2009; cath 1/11: S-RCA ok with prox 30-40% and 50% stenoses; S-OM and RI  with patent OM limb but an occluded RI limb; S-D2 and L-LAD ok; EF 55%   Cataract    removed both eyes    Clotting disorder (HCC)    s/p CABG x 5- PE    Colon cancer ([place]) 1992   COPD (chronic obstructive pulmonary disease) ([place])    Diverticulosis    Essential hypertension    Family history of breast cancer    Family history of colon cancer    Family history of lung cancer    GERD (gastroesophageal reflux disease)    History of pulmonary embolus (PE) 2007   Following CABG   Hyperlipidemia    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   OSA on CPAP    Paroxysmal atrial fibrillation ([place])    Previously on Multaq, declines anticoagulation   Personal history of colonic polyps 01/22/2000   Tubular adenoma   RBBB (right bundle branch block)    Sleep apnea    no cpap    Squamous cell carcinoma of skin 02/11/2008   [place]-Left paraspinal, lower    Squamous cell carcinoma of skin  07/11/2017   in situ-left forearm    Patient Active Problem List   Diagnosis Date Noted   Secondary right ventricular dilation    Atrial septal defect 06/12/2021   Acute on chronic combined systolic and diastolic CHF (congestive heart failure) --EF 35 to 40% 03/26/2021   New onset a-fib ([place]) 03/25/2021   Paroxysmal A-fib with RVR 03/25/2021   History of  deep vein thrombosis (DVT)/H/o Provoked DVT in 2007 --after CABG,  03/25/2021   Pneumonia due to COVID-19 virus 10/04/2020   Genetic testing 11/01/5207   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   Family history of colon cancer    Family history of breast cancer    Family history of lung cancer    Allergic rhinitis 07/21/2019   Nasal obstruction 01/22/2019   Post PTCA 10/07/2018   Enrolled in clinical trial of drug 10/07/2018   COPD (chronic obstructive pulmonary disease) ([place]) 08/08/2018   CAD-  S/P stenting of high OM1/ramus intermediate with 3.0 x 23 mm Xience Sierra DES/S/p CABG 07/03/2017   Abnormal nuclear stress test 07/03/2017   Weakness 06/13/2017   Bad posture 09/08/2014   Leg weakness, bilateral 09/08/2014   Anemia 03/07/2012   Other general symptoms(780.99)  03/07/2012   GERD (gastroesophageal reflux disease) 03/07/2012   Personal history of malignant neoplasm of rectum, rectosigmoid junction, and anus 03/07/2012   Hx of CABG 03/07/2012   Chronic diastolic heart failure ([place]) 12/06/2011   Fatigue 04/03/2011   Aortic aneurysm ([place]) 04/03/2011   SYNCOPE 02/21/2011   PALPITATIONS 02/21/2011   BRADYCARDIA 01/22/2011   ATRIAL FIBRILLATION 01/23/2010   Obstructive sleep apnea 01/16/2010   Dyspnea on exertion 05/12/2009   HYPERCHOLESTEROLEMIA 03/22/2009   HYPERLIPIDEMIA 03/22/2009   OBESITY 03/22/2009   Essential hypertension 03/22/2009   Coronary atherosclerosis 03/22/2009   BUNDLE BRANCH BLOCK, RIGHT 03/22/2009   GERD 03/22/2009   BPH (benign prostatic hyperplasia) 03/22/2009   Personal history of colonic  polyps 01/22/2000    Past Surgical History:  Procedure Laterality Date   CARDIOVERSION N/A 05/23/2021   Procedure: CARDIOVERSION;  Surgeon: Nigel Mormon, MD;  Location: [facility];  Service: Cardiovascular;  Laterality: N/A;   COLECTOMY  1992   w/ colostomy  related to colon cancer   [place]   to take down Colostomy   COLONOSCOPY     CORONARY ARTERY BYPASS GRAFT  2007   CORONARY STENT INTERVENTION N/A 10/07/2018   Procedure: CORONARY STENT INTERVENTION;  Surgeon: Adrian Prows, MD;  Location: [place] [facility];  Service: Cardiovascular;  Laterality: N/A;   HAND [place]   tendon repair and nerve; left   INGUINAL HERNIA REPAIR  2009   LAPAROSCOPIC CHOLECYSTECTOMY  2004   PATENT FORAMEN OVALE(PFO) CLOSURE N/A 06/13/2021   Procedure: PATENT FORAMEN OVALE (PFO) CLOSURE;  Surgeon: Adrian Prows, MD;  Location: [place];  Service: Cardiovascular;  Laterality: N/A;   POLYPECTOMY     RIGHT HEART CATH N/A 06/13/2021   Procedure: RIGHT HEART CATH;  Surgeon: Adrian Prows, MD;  Location: [place] [facility];  Service: Cardiovascular;  Laterality: N/A;   RIGHT/LEFT HEART CATH AND CORONARY/GRAFT ANGIOGRAPHY N/A 07/03/2017   Procedure: Right/Left Heart Cath and Coronary/Graft Angiography;  Surgeon: Leonie Man, MD;  Location: [place];  Service: Cardiovascular;  Laterality: N/A;   RIGHT/LEFT HEART CATH AND CORONARY/GRAFT ANGIOGRAPHY N/A 10/07/2018   Procedure: RIGHT/LEFT HEART CATH AND CORONARY/GRAFT ANGIOGRAPHY;  Surgeon: Adrian Prows, MD;  Location: [place] [facility];  Service: Cardiovascular;  Laterality: N/A;   TEE WITHOUT CARDIOVERSION  05/23/2021   Procedure: TRANSESOPHAGEAL ECHOCARDIOGRAM (TEE);  Surgeon: Nigel Mormon, MD;  Location: [facility];  Service: Cardiovascular;;   UMBILICAL HERNIA REPAIR     VENTRAL HERNIA REPAIR  2009       Family History  Problem Relation Age of Onset   Lung cancer Mother    Heart disease  Mother    Emphysema Father    Heart disease Father    Cancer Sister 74       unknown type   Breast cancer Sister        dx. >50   Colon cancer Sister        dx. >50   Breast cancer Sister        dx. >50   Colon cancer Sister        dx. >50   Colon cancer Cousin        dx. early 86s   Esophageal cancer Neg Hx    Rectal cancer Neg Hx    Stomach cancer Neg Hx    Colon polyps Neg Hx     Social History   Tobacco Use   Smoking status:  Former    Packs/day: 1.00    Years: 45.00    Pack years: 45.00    Types: Cigarettes    Start date: 1955    Quit date: 12/18/1995    Years since quitting: 25.8   Smokeless tobacco: Never  Vaping Use   Vaping Use: Never used  Substance Use Topics   Alcohol use: No    Comment: quit in 1980   Drug use: No    Home Medications Prior to Admission medications   Medication Sig Start Date End Date Taking? Authorizing Provider  albuterol (PROVENTIL HFA;VENTOLIN HFA) 108 (90 Base) MCG/ACT inhaler Inhale 2 puffs into the lungs every 4 (four) hours as needed for wheezing or shortness of breath. 08/08/18   Collene Gobble, MD  albuterol (PROVENTIL) (2.5 MG/3ML) 0.083% nebulizer solution USE 1 VIAL IN NEBULIZER EVERY 6 HOURS AS NEEDED FOR WHEEZING OR SHORTNESS OF BREATH 12/12/20   Collene Gobble, MD  atorvastatin (LIPITOR) 20 MG tablet Take 20 mg by mouth at bedtime.  03/31/18   [provider]  clonazePAM (KLONOPIN) 1 MG tablet Take 1 mg by mouth at bedtime as needed (sleep). 08/20/20   [provider]  ELIQUIS 5 MG TABS tablet Take 1 tablet by mouth twice daily 09/08/21   Cantwell, Celeste C, PA-C  fluticasone (FLONASE) 50 MCG/ACT nasal spray Place 2 sprays into both nostrils daily. 01/22/19   Juanito Doom, MD  gabapentin (NEURONTIN) 100 MG capsule Take 100-200 mg by mouth See admin instructions. Take 200 mg am & 100 mg at bedtime    [provider]  guaiFENesin (MUCINEX) 600 MG 12 hr tablet Take 600 mg by mouth daily as needed  (bronchitis).    [provider]  HYDROcodone-acetaminophen (NORCO/VICODIN) 5-325 MG tablet Take 1 tablet by mouth every 6 (six) hours as needed for moderate pain.    [provider]  isosorbide mononitrate (IMDUR) 30 MG 24 hr tablet Take 1 tablet by mouth once daily 04/14/19   Adrian Prows, MD  Magnesium 200 MG TABS Take 200 mg by mouth in the morning.    [provider]  metoprolol tartrate (LOPRESSOR) 50 MG tablet Take 1 tablet (50 mg total) by mouth 2 (two) times daily. 03/30/21 03/25/22  Cantwell, Celeste C, PA-C  Multiple Vitamin (MULTIVITAMIN WITH MINERALS) TABS tablet Take 1 tablet by mouth in the morning.    [provider]  nitroGLYCERIN (NITROSTAT) 0.4 MG SL tablet Place 1 tablet (0.4 mg total) under the tongue every 5 (five) minutes as needed for chest pain. 11/28/16   Imogene Burn, PA-C  Omega-3 Fatty Acids (FISH OIL) 1000 MG CAPS Take 1,000 mg by mouth in the morning.    [provider]  omeprazole (PRILOSEC) 20 MG capsule Take 1 capsule (20 mg total) by mouth daily. 03/26/21   Roxan Hockey, MD  Phenylephrine-DM-GG (ROBITUSSIN COUGH/COLD CF MAX PO) Take 20 mLs by mouth every 6 (six) hours as needed (cough).    [provider]  Potassium Chloride ER 20 MEQ TBCR Take 20 mEq by mouth in the morning. 01/12/21   [provider]  STIOLTO RESPIMAT 2.5-2.5 MCG/ACT AERS INHALE 2 PUFFS BY MOUTH ONCE DAILY 03/09/21   Collene Gobble, MD  tamsulosin (FLOMAX) 0.4 MG CAPS capsule Take 0.8 mg by mouth at bedtime.    [provider]  tiZANidine (ZANAFLEX) 4 MG tablet Take 4 mg by mouth at bedtime as needed for muscle spasms.    [provider]  torsemide (DEMADEX) 20 MG tablet Take 40 mg by mouth in the morning. 01/26/21   [provider]    Allergies    Morphine sulfate, Rosuvastatin calcium, and Ultram [tramadol]  Review of Systems   Review of Systems  Constitutional:  Negative for chills and fever.   Cardiovascular:  Negative for chest pain.  Musculoskeletal:  Positive for arthralgias and joint swelling.   Physical Exam Updated Vital Signs BP (!) 141/78 (BP Location: Right Arm)   Pulse 69   Temp 97.8 F (36.6 C) (Oral)   Resp 19   Ht 1.803 m (_0 )   Wt 109.8 kg   SpO2 96%   BMI 33.75 kg/m   Physical Exam CONSTITUTIONAL: Elderly, no acute distress HEAD: Normocephalic/atraumatic EYES: EOMI ENMT: Mucous membranes moist NECK: supple no meningeal signs CV: S1/S2 noted LUNGS: Lungs are clear to auscultation bilaterally, no apparent distress ABDOMEN: soft NEURO: Pt is awake/alert/appropriate, moves all extremitiesx4.  No facial droop.   EXTREMITIES: pulses normal/equal, full ROM Soft tissue swelling noted on the dorsal aspect of the left hand just distal to the wrist.  There is no erythema.  The area is tender to palpation but no crepitus.  There is no erythematous streaking. No other tenderness noted to the hand wrist or forearm. SKIN: warm, color normal PSYCH: no abnormalities of mood noted, alert and oriented to situation  ED Results / Procedures / Treatments   Labs (all labs ordered are listed, but only abnormal results are displayed) Labs Reviewed - No data to display  EKG None  Radiology DG Wrist Complete Left  Result Date: 09/29/2021 CLINICAL DATA:  Left wrist pain EXAM: LEFT WRIST - COMPLETE 3+ VIEW COMPARISON:  None. FINDINGS: Normal alignment. No fracture or dislocation. Joint space narrowing and subchondral sclerosis is seen involving the first [facility] joint and triscaphe joint of the base of the thumb as well as the lunocapitate articulation in keeping with changes of a mild to moderate degenerative arthritis. Suture like foreign body is seen within the soft tissues volar to the fourth metacarpal head which may be postsurgical in nature. There is soft tissue swelling along the dorsum of the carpus. IMPRESSION: Soft tissue swelling,.  No fracture or dislocation.  Multifocal degenerative changes outlined above. Suture like foreign body within the left palmar soft tissues, possibly postsurgical in nature. Electronically Signed   By: Fidela Salisbury M.D.   On: 09/29/2021 03:18    Procedures Procedures   Medications Ordered in ED Medications  HYDROmorphone (DILAUDID) injection 0.5 mg (has no administration in time range)    ED Course  I have reviewed the triage vital signs and the nursing notes.  Pertinent imaging results that were available during my care of the patient were reviewed by me and considered in my medical decision making (see chart for details).    MDM Rules/Calculators/A&P                           Patient presents with soft tissue swelling and pain left hand/wrist.  Differential includes gouty arthritis, right reactive arthritis or even a ganglion cyst.  No erythema or warmth to suggest septic arthritis I personally viewed the x-ray and there is no acute fractures Patient already has pain meds at home.  He is intolerant of prednisone due to previous history of A. Fib as he reports that was the cause of previous episodes He does request a injection of pain medicine here Will place in  a removable splint and referred to orthopedic Final Clinical Impression(s) / ED Diagnoses Final diagnoses:  Left wrist pain    Rx / DC Orders ED Discharge Orders     None        Ripley Fraise, MD 09/29/21 [phone]

## 2021-09-29 NOTE — ED Triage Notes (Signed)
Pt c/o left wrist pain since yesterday. Pt denies injury or hx of same.

## 2021-10-09 DIAGNOSIS — M25432 Effusion, left wrist: Secondary | ICD-10-CM | POA: Diagnosis not present

## 2021-10-09 DIAGNOSIS — M19032 Primary osteoarthritis, left wrist: Secondary | ICD-10-CM | POA: Diagnosis not present

## 2021-10-09 DIAGNOSIS — M25532 Pain in left wrist: Secondary | ICD-10-CM | POA: Diagnosis not present

## 2021-10-10 ENCOUNTER — Other Ambulatory Visit: Payer: Medicare PPO

## 2021-10-11 DIAGNOSIS — R600 Localized edema: Secondary | ICD-10-CM | POA: Diagnosis not present

## 2021-10-12 ENCOUNTER — Ambulatory Visit: Payer: Medicare PPO | Admitting: Cardiology

## 2021-10-12 LAB — BASIC METABOLIC PANEL
BUN/Creatinine Ratio: 9 — ABNORMAL LOW (ref 10–24)
BUN: 12 mg/dL (ref 8–27)
CO2: 22 mmol/L (ref 20–29)
Calcium: 9.2 mg/dL (ref 8.6–10.2)
Chloride: 102 mmol/L (ref 96–106)
Creatinine, Ser: 1.32 mg/dL — ABNORMAL HIGH (ref 0.76–1.27)
Glucose: 113 mg/dL — ABNORMAL HIGH (ref 70–99)
Potassium: 4.1 mmol/L (ref 3.5–5.2)
Sodium: 140 mmol/L (ref 134–144)
eGFR: 54 mL/min/{1.73_m2} — ABNORMAL LOW (ref >59)

## 2021-10-12 LAB — BRAIN NATRIURETIC PEPTIDE: BNP: 79.8 pg/mL (ref 0.0–100.0)

## 2021-10-14 NOTE — Addendum Note (Signed)
Addended by: Lawerance Cruel L on: 10/14/2021 10:17 AM   Modules accepted: Orders

## 2021-10-14 NOTE — Addendum Note (Signed)
Addended by: Lawerance Cruel L on: 10/14/2021 10:16 AM   Modules accepted: Orders

## 2021-10-17 NOTE — Progress Notes (Signed)
Called and spoke to pt, pt voiced understanding.

## 2021-10-27 DIAGNOSIS — M7989 Other specified soft tissue disorders: Secondary | ICD-10-CM | POA: Diagnosis not present

## 2021-10-27 DIAGNOSIS — E7800 Pure hypercholesterolemia, unspecified: Secondary | ICD-10-CM | POA: Diagnosis not present

## 2021-10-27 DIAGNOSIS — M25532 Pain in left wrist: Secondary | ICD-10-CM | POA: Diagnosis not present

## 2021-10-27 DIAGNOSIS — B351 Tinea unguium: Secondary | ICD-10-CM | POA: Diagnosis not present

## 2021-11-14 ENCOUNTER — Encounter: Payer: Self-pay | Admitting: Podiatry

## 2021-11-14 ENCOUNTER — Other Ambulatory Visit: Payer: Self-pay

## 2021-11-14 ENCOUNTER — Ambulatory Visit: Payer: Medicare PPO | Admitting: Podiatry

## 2021-11-14 DIAGNOSIS — J4521 Mild intermittent asthma with (acute) exacerbation: Secondary | ICD-10-CM | POA: Insufficient documentation

## 2021-11-14 DIAGNOSIS — M79675 Pain in left toe(s): Secondary | ICD-10-CM

## 2021-11-14 DIAGNOSIS — G259 Extrapyramidal and movement disorder, unspecified: Secondary | ICD-10-CM | POA: Insufficient documentation

## 2021-11-14 DIAGNOSIS — N481 Balanitis: Secondary | ICD-10-CM | POA: Insufficient documentation

## 2021-11-14 DIAGNOSIS — J410 Simple chronic bronchitis: Secondary | ICD-10-CM | POA: Insufficient documentation

## 2021-11-14 DIAGNOSIS — B351 Tinea unguium: Secondary | ICD-10-CM | POA: Diagnosis not present

## 2021-11-14 DIAGNOSIS — G8929 Other chronic pain: Secondary | ICD-10-CM | POA: Insufficient documentation

## 2021-11-14 DIAGNOSIS — M79674 Pain in right toe(s): Secondary | ICD-10-CM | POA: Diagnosis not present

## 2021-11-14 DIAGNOSIS — F419 Anxiety disorder, unspecified: Secondary | ICD-10-CM | POA: Insufficient documentation

## 2021-11-14 DIAGNOSIS — M25474 Effusion, right foot: Secondary | ICD-10-CM | POA: Diagnosis not present

## 2021-11-14 DIAGNOSIS — N400 Enlarged prostate without lower urinary tract symptoms: Secondary | ICD-10-CM | POA: Insufficient documentation

## 2021-11-14 DIAGNOSIS — M5430 Sciatica, unspecified side: Secondary | ICD-10-CM | POA: Insufficient documentation

## 2021-11-14 DIAGNOSIS — N1831 Chronic kidney disease, stage 3a: Secondary | ICD-10-CM | POA: Insufficient documentation

## 2021-11-14 DIAGNOSIS — I714 Abdominal aortic aneurysm, without rupture, unspecified: Secondary | ICD-10-CM | POA: Insufficient documentation

## 2021-11-14 DIAGNOSIS — Z7901 Long term (current) use of anticoagulants: Secondary | ICD-10-CM

## 2021-11-14 MED ORDER — CICLOPIROX 8 % EX SOLN: Freq: Every day | CUTANEOUS | 2 refills | Status: DC

## 2021-11-14 NOTE — Patient Instructions (Signed)
Ciclopirox nail solution What is this medication? CICLOPIROX (sye kloe PEER ox) NAIL SOLUTION is an antifungal medicine. It used to treat fungal infections of the nails. This medicine may be used for other purposes; ask your health care provider or pharmacist if you have questions. COMMON BRAND NAME(S): Ciclodan Nail Solution, CNL8, [place] What should I tell my care team before I take this medication? They need to know if you have any of these conditions: diabetes mellitus history of seizures HIV infection immune system problems or organ transplant large areas of burned or damaged skin peripheral vascular disease or poor circulation taking corticosteroid medication (including steroid inhalers, cream, or lotion) an unusual or allergic reaction to ciclopirox, isopropyl alcohol, other medicines, foods, dyes, or preservatives pregnant or trying to get pregnant breast-feeding How should I use this medication? This medicine is for external use only. Follow the directions that come with this medicine exactly. Wash and dry your hands before use. Avoid contact with the eyes, mouth or nose. If you do get this medicine in your eyes, rinse out with plenty of cool tap water. Contact your doctor or health care professional if eye irritation occurs. Use at regular intervals. Do not use your medicine more often than directed. Finish the full course prescribed by your doctor or health care professional even if you think you are better. Do not stop using except on your doctor's advice. Talk to your pediatrician regarding the use of this medicine in children. While this medicine may be prescribed for children as young as 12 years for selected conditions, precautions do apply. Overdosage: If you think you have taken too much of this medicine contact a poison control center or emergency room at once. NOTE: This medicine is only for you. Do not share this medicine with others. What if I miss a dose? If you miss a  dose, use it as soon as you can. If it is almost time for your next dose, use only that dose. Do not use double or extra doses. What may interact with this medication? Interactions are not expected. Do not use any other skin products without telling your doctor or health care professional. This list may not describe all possible interactions. Give your health care provider a list of all the medicines, herbs, non-prescription drugs, or dietary supplements you use. Also tell them if you smoke, drink alcohol, or use illegal drugs. Some items may interact with your medicine. What should I watch for while using this medication? Tell your doctor or health care professional if your symptoms get worse. Four to six months of treatment may be needed for the nail(s) to improve. Some people may not achieve a complete cure or clearing of the nails by this time. Tell your doctor or health care professional if you develop sores or blisters that do not heal properly. If your nail infection returns after stopping using this product, contact your doctor or health care professional. What side effects may I notice from receiving this medication? Side effects that you should report to your doctor or health care professional as soon as possible: allergic reactions like skin rash, itching or hives, swelling of the face, lips, or tongue severe irritation, redness, burning, blistering, peeling, swelling, oozing Side effects that usually do not require medical attention (report to your doctor or health care professional if they continue or are bothersome): mild reddening of the skin nail discoloration temporary burning or mild stinging at the site of application This list may not describe all possible  side effects. Call your doctor for medical advice about side effects. You may report side effects to FDA at 1-800-FDA-1088. Where should I keep my medication? Keep out of the reach of children. Store at room temperature  between 15 and 30 degrees C (59 and 86 degrees F). Do not freeze. Protect from light by storing the bottle in the carton after every use. This medicine is flammable. Keep away from heat and flame. Throw away any unused medicine after the expiration date. NOTE: This sheet is a summary. It may not cover all possible information. If you have questions about this medicine, talk to your doctor, pharmacist, or health care provider.  2022 Elsevier/Gold Standard (2008-04-21 00:00:00)

## 2021-11-16 NOTE — Progress Notes (Signed)
Subjective:   Patient ID: [name], male   DOB: 80 y.o.   MRN: [id number]   HPI 80 year old male presents the office with concerns of his toes becoming thickened discolored causing discomfort and some difficulty trimming them himself.  Denies any swelling or redness or injury to the toenail sites.  Secondary concerns as he does have some swelling and pain to his right foot which has resolved as well some redness.  States he was tested for gout and it was not gout.  No recent injury.    Past Medical History:  Diagnosis Date   Allergy    Arthritis    BPH (benign prostatic hyperplasia)    CAD (coronary artery disease)    s/p CABG 2007; NSTEMI in setting of AFib with RVR 12/2009; cath 1/11: S-RCA ok with prox 30-40% and 50% stenoses; S-OM and RI  with patent OM limb but an occluded RI limb; S-D2 and L-LAD ok; EF 55%   Cataract    removed both eyes    Clotting disorder (HCC)    s/p CABG x 5- PE    Colon cancer ([place]) 1992   COPD (chronic obstructive pulmonary disease) (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   OSA on CPAP    Paroxysmal atrial fibrillation ([place])    Previously on Multaq, declines anticoagulation   Personal history of colonic polyps 01/22/2000   Tubular adenoma   RBBB (right bundle branch block)    Sleep apnea    no cpap    Squamous cell carcinoma of skin 02/11/2008   [place]-Left paraspinal, lower    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   in situ-left forearm    Past Surgical History:  Procedure Laterality Date   CARDIOVERSION N/A 05/23/2021   Procedure: CARDIOVERSION;  Surgeon: Nigel Mormon, MD;  Location: [facility];  Service: Cardiovascular;  Laterality: N/A;   COLECTOMY   1992   w/ colostomy  related to colon cancer   [place]   to take down Colostomy   COLONOSCOPY     CORONARY ARTERY BYPASS GRAFT  2007   CORONARY STENT INTERVENTION N/A 10/07/2018   Procedure: CORONARY STENT INTERVENTION;  Surgeon: Adrian Prows, MD;  Location: [facility];  Service: Cardiovascular;  Laterality: N/A;   HAND [place]   tendon repair and nerve; left   INGUINAL HERNIA REPAIR  2009   LAPAROSCOPIC CHOLECYSTECTOMY  2004   PATENT FORAMEN OVALE(PFO) CLOSURE N/A 06/13/2021   Procedure: PATENT FORAMEN OVALE (PFO) CLOSURE;  Surgeon: Adrian Prows, MD;  Location: [facility];  Service: Cardiovascular;  Laterality: N/A;   POLYPECTOMY     RIGHT HEART CATH N/A 06/13/2021   Procedure: RIGHT HEART CATH;  Surgeon: Adrian Prows, MD;  Location: [facility];  Service: Cardiovascular;  Laterality: N/A;   RIGHT/LEFT HEART CATH AND CORONARY/GRAFT ANGIOGRAPHY N/A 07/03/2017   Procedure: Right/Left Heart Cath and Coronary/Graft Angiography;  Surgeon: Leonie Man, MD;  Location: [facility];  Service: Cardiovascular;  Laterality: N/A;   RIGHT/LEFT HEART CATH AND CORONARY/GRAFT ANGIOGRAPHY N/A 10/07/2018   Procedure: RIGHT/LEFT HEART  CATH AND CORONARY/GRAFT ANGIOGRAPHY;  Surgeon: Adrian Prows, MD;  Location: [facility];  Service: Cardiovascular;  Laterality: N/A;   TEE WITHOUT CARDIOVERSION  05/23/2021   Procedure: TRANSESOPHAGEAL ECHOCARDIOGRAM (TEE);  Surgeon: Nigel Mormon, MD;  Location: [facility];  Service: Cardiovascular;;   UMBILICAL HERNIA REPAIR     VENTRAL HERNIA REPAIR  2009     Current Outpatient Medications:    ciclopirox (PENLAC) 8 % solution, Apply topically at bedtime. Apply over nail and surrounding skin. Apply daily over previous coat. After seven (7) days, may remove with alcohol and continue cycle., Disp: 6.6 mL, Rfl: 2   albuterol (PROVENTIL HFA;VENTOLIN HFA) 108 (90 Base) MCG/ACT inhaler, Inhale 2 puffs  into the lungs every 4 (four) hours as needed for wheezing or shortness of breath., Disp: 1 Inhaler, Rfl: 5   albuterol (PROVENTIL) (2.5 MG/3ML) 0.083% nebulizer solution, USE 1 VIAL IN NEBULIZER EVERY 6 HOURS AS NEEDED FOR WHEEZING OR SHORTNESS OF BREATH, Disp: 75 mL, Rfl: 3   atorvastatin (LIPITOR) 20 MG tablet, Take 20 mg by mouth at bedtime. , Disp: , Rfl: 5   clonazePAM (KLONOPIN) 1 MG tablet, Take 1 mg by mouth at bedtime as needed (sleep)., Disp: , Rfl:    ELIQUIS 5 MG TABS tablet, Take 1 tablet by mouth twice daily, Disp: 180 tablet, Rfl: 1   fluticasone (FLONASE) 50 MCG/ACT nasal spray, Place 2 sprays into both nostrils daily., Disp: 16 g, Rfl: 2   gabapentin (NEURONTIN) 100 MG capsule, Take 100-200 mg by mouth See admin instructions. Take 200 mg am & 100 mg at bedtime, Disp: , Rfl:    guaiFENesin (MUCINEX) 600 MG 12 hr tablet, Take 600 mg by mouth daily as needed (bronchitis)., Disp: , Rfl:    HYDROcodone-acetaminophen (NORCO/VICODIN) 5-325 MG tablet, Take 1 tablet by mouth every 6 (six) hours as needed for moderate pain., Disp: , Rfl:    isosorbide mononitrate (IMDUR) 30 MG 24 hr tablet, Take 1 tablet by mouth once daily, Disp: 90 tablet, Rfl: 1   Magnesium 200 MG TABS, Take 200 mg by mouth in the morning., Disp: , Rfl:    metoprolol tartrate (LOPRESSOR) 50 MG tablet, Take 1 tablet (50 mg total) by mouth 2 (two) times daily., Disp: 180 tablet, Rfl: 3   Multiple Vitamin (MULTIVITAMIN WITH MINERALS) TABS tablet, Take 1 tablet by mouth in the morning., Disp: , Rfl:    nitroGLYCERIN (NITROSTAT) 0.4 MG SL tablet, Place 1 tablet (0.4 mg total) under the tongue every 5 (five) minutes as needed for chest pain., Disp: 25 tablet, Rfl: 3   Omega-3 Fatty Acids (FISH OIL) 1000 MG CAPS, Take 1,000 mg by mouth in the morning., Disp: , Rfl:    omeprazole (PRILOSEC) 20 MG capsule, Take 1 capsule (20 mg total) by mouth daily., Disp: 30 capsule, Rfl: 3   Phenylephrine-DM-GG (ROBITUSSIN COUGH/COLD CF MAX PO),  Take 20 mLs by mouth every 6 (six) hours as needed (cough)., Disp: , Rfl:    Potassium Chloride ER 20 MEQ TBCR, Take 20 mEq by mouth in the morning., Disp: , Rfl:    STIOLTO RESPIMAT 2.5-2.5 MCG/ACT AERS, INHALE 2 PUFFS BY MOUTH ONCE DAILY, Disp: 4 g, Rfl: 5   tamsulosin (FLOMAX) 0.4 MG CAPS capsule, Take 0.8 mg by mouth at bedtime., Disp: , Rfl:    tiZANidine (ZANAFLEX) 4 MG tablet, Take 4 mg by mouth at bedtime as needed for muscle spasms., Disp: , Rfl:    torsemide (DEMADEX) 20 MG tablet, Take  40 mg by mouth in the morning., Disp: , Rfl:   Allergies  Allergen Reactions   Morphine Sulfate Itching   Rosuvastatin Calcium Other (See Comments)    Muscle weakness   Ultram [Tramadol] Itching      Objective:  Physical Exam  General: AAO x3, NAD  Dermatological: Nails are hypertrophic, dystrophic, brittle, discolored, elongated 10. No surrounding redness or drainage. Tenderness nails 1-5 bilaterally. No open lesions or pre-ulcerative lesions are identified today.  Vascular: Dorsalis Pedis artery and Posterior Tibial artery pedal pulses are palpable bilateral with immedate capillary fill time.  There is no pain with calf compression, swelling, warmth, erythema.   Neruologic: Grossly intact via light touch bilateral.   Musculoskeletal: On the right foot there is no area of tenderness other than the nails there is no swelling or redness or any signs of infection.  Muscular strength 5/5 in all groups tested bilateral.  Gait: Unassisted, Nonantalgic.       Assessment:   Symptomatic onychomycosis, right foot history of swelling, erythema     Plan:  -Treatment options discussed including all alternatives, risks, and complications -Etiology of symptoms were discussed -Nails debrided 10 without complications or bleeding. Prescribed penlac.  -Discussed an x-ray of the right foot but he was in time constraints today could not have it done.  If symptoms recur to let me know we will further  evaluate the right foot. -Daily foot inspection -Follow-up in 3 months or sooner if any problems arise. In the meantime, encouraged to call the office with any questions, concerns, change in symptoms.   Celesta Gentile, DPM

## 2021-11-25 ENCOUNTER — Other Ambulatory Visit: Payer: Self-pay | Admitting: Emergency Medicine

## 2021-12-20 DIAGNOSIS — B349 Viral infection, unspecified: Secondary | ICD-10-CM | POA: Diagnosis not present

## 2021-12-20 DIAGNOSIS — J441 Chronic obstructive pulmonary disease with (acute) exacerbation: Secondary | ICD-10-CM | POA: Diagnosis not present

## 2021-12-21 DIAGNOSIS — R051 Acute cough: Secondary | ICD-10-CM | POA: Diagnosis not present

## 2021-12-21 DIAGNOSIS — Z20822 Contact with and (suspected) exposure to covid-19: Secondary | ICD-10-CM | POA: Diagnosis not present

## 2021-12-21 DIAGNOSIS — B349 Viral infection, unspecified: Secondary | ICD-10-CM | POA: Diagnosis not present

## 2021-12-27 ENCOUNTER — Telehealth: Payer: Self-pay | Admitting: Student

## 2021-12-28 ENCOUNTER — Other Ambulatory Visit: Payer: Self-pay

## 2021-12-28 ENCOUNTER — Encounter: Payer: Self-pay | Admitting: Student

## 2021-12-28 ENCOUNTER — Ambulatory Visit: Payer: Medicare PPO | Admitting: Student

## 2021-12-28 ENCOUNTER — Other Ambulatory Visit: Payer: Medicare PPO

## 2021-12-28 VITALS — BP 114/68 | HR 53 | Temp 98.0°F | Ht 71.0 in | Wt 241.0 lb

## 2021-12-28 DIAGNOSIS — I480 Paroxysmal atrial fibrillation: Secondary | ICD-10-CM

## 2021-12-28 DIAGNOSIS — R002 Palpitations: Secondary | ICD-10-CM

## 2021-12-28 DIAGNOSIS — I25810 Atherosclerosis of coronary artery bypass graft(s) without angina pectoris: Secondary | ICD-10-CM | POA: Diagnosis not present

## 2021-12-28 DIAGNOSIS — I493 Ventricular premature depolarization: Secondary | ICD-10-CM | POA: Diagnosis not present

## 2021-12-28 NOTE — Progress Notes (Signed)
Primary Physician:  Shirline Frees, MD   Patient ID: [name], male    DOB: 1941/01/28, 81 y.o.   MRN: [id number]  Subjective:    Chief Complaint  Patient presents with   Bradycardia   Follow-up   Atrial Fibrillation    HPI: [name]  is a 81 y.o. male  with known coronary artery disease and CABG in 2007 in the setting of non-STEMI in atrial fibrillation with RVR. He has a history of  45 pack year h/o smoking tobacco, ascending aortic aneurysm (follows Dr.Bryan Bartle), small abdominal aortic aneurysm noted in 2015 (3cm), H/O colon cancer, in remission since 1992, GERD, hyperlipidemia, obstructive sleep apnea now on CPAP.   Patient hospitalized 03/2021 with recurrence of atrial flutter and echocardiogram revealed new onset LVEF 35-40% with global hypokinesis. Patient underwent successful cardioversion 05/23/2021 with return to normal sinus rhythm.  TEE prior to cardioversion noted ASD, which was subsequently repaired by Dr. Einar Gip 06/13/2021.  Patient was last seen in our office 09/27/2021 with worsening leg edema, which time ordered BNP which was normal and BMP which noted mild increase in creatinine, therefore he was advised to have repeat BMP but unfortunately this has not been done.  Also last office visit ordered repeat echocardiogram, however this is scheduled for April.   Patient now presents at his request for urgent visit with concerns of bradycardia.  Patient reports he has been having "spells" daily while sitting at rest with feelings of fatigue and weakness.  During these episodes he notices his heart rate tends to be low on pulse oximeter, lowest being 47 bpm.  Heart rate and symptoms improve typically in less than 1 minute.  He will sometimes also experience palpitations with these episodes.  Denies syncope or near syncope.  Denies chest pain, orthopnea, PND, dyspnea.  Past Medical History:  Diagnosis Date   Allergy    Arthritis    BPH (benign prostatic  hyperplasia)    CAD (coronary artery disease)    s/p CABG 2007; NSTEMI in setting of AFib with RVR 12/2009; cath 1/11: S-RCA ok with prox 30-40% and 50% stenoses; S-OM and RI  with patent OM limb but an occluded RI limb; S-D2 and L-LAD ok; EF 55%   Cataract    removed both eyes    Clotting disorder (HCC)    s/p CABG x 5- PE    Colon cancer ([place]) 1992   COPD (chronic obstructive pulmonary disease) ([place])    Diverticulosis    Essential hypertension    Family history of breast cancer    Family history of colon cancer    Family history of lung cancer    GERD (gastroesophageal reflux disease)    History of pulmonary embolus (PE) 2007   Following CABG   Hyperlipidemia    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HCC)    Right upper   OSA on CPAP    Paroxysmal atrial fibrillation (HCC)    Previously on Multaq, declines anticoagulation   Personal history of colonic polyps 01/22/2000   Tubular adenoma   RBBB (right bundle branch block)    Sleep apnea    no cpap    Squamous cell carcinoma of skin 02/11/2008   [place]-Left paraspinal, lower    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   in situ-left forearm   Past Surgical History:  Procedure Laterality Date   CARDIOVERSION N/A 05/23/2021   Procedure: CARDIOVERSION;  Surgeon: Nigel Mormon, MD;  Location: [place];  Service: Cardiovascular;  Laterality:  N/A;   COLECTOMY  1992   w/ colostomy  related to colon cancer   COLON SURGERY  1993   to take down Colostomy   COLONOSCOPY     CORONARY ARTERY BYPASS GRAFT  2007   CORONARY STENT INTERVENTION N/A 10/07/2018   Procedure: CORONARY STENT INTERVENTION;  Surgeon: Adrian Prows, MD;  Location: [place] [facility];  Service: Cardiovascular;  Laterality: N/A;   HAND [place]   tendon repair and nerve; left   INGUINAL HERNIA REPAIR  2009   LAPAROSCOPIC CHOLECYSTECTOMY  2004   PATENT FORAMEN OVALE(PFO) CLOSURE N/A 06/13/2021   Procedure: PATENT FORAMEN OVALE (PFO) CLOSURE;  Surgeon:  Adrian Prows, MD;  Location: [place] [facility];  Service: Cardiovascular;  Laterality: N/A;   POLYPECTOMY     RIGHT HEART CATH N/A 06/13/2021   Procedure: RIGHT HEART CATH;  Surgeon: Adrian Prows, MD;  Location: [place] [facility];  Service: Cardiovascular;  Laterality: N/A;   RIGHT/LEFT HEART CATH AND CORONARY/GRAFT ANGIOGRAPHY N/A 07/03/2017   Procedure: Right/Left Heart Cath and Coronary/Graft Angiography;  Surgeon: Leonie Man, MD;  Location: [place] [facility];  Service: Cardiovascular;  Laterality: N/A;   RIGHT/LEFT HEART CATH AND CORONARY/GRAFT ANGIOGRAPHY N/A 10/07/2018   Procedure: RIGHT/LEFT HEART CATH AND CORONARY/GRAFT ANGIOGRAPHY;  Surgeon: Adrian Prows, MD;  Location: [place] [facility];  Service: Cardiovascular;  Laterality: N/A;   TEE WITHOUT CARDIOVERSION  05/23/2021   Procedure: TRANSESOPHAGEAL ECHOCARDIOGRAM (TEE);  Surgeon: Nigel Mormon, MD;  Location: [facility];  Service: Cardiovascular;;   UMBILICAL HERNIA REPAIR     VENTRAL HERNIA REPAIR  2009   Family History  Problem Relation Age of Onset   Lung cancer Mother    Heart disease Mother    Emphysema Father    Heart disease Father    Cancer Sister 43       unknown type   Breast cancer Sister        dx. >50   Colon cancer Sister        dx. >50   Breast cancer Sister        dx. >50   Colon cancer Sister        dx. >50   Colon cancer Cousin        dx. early 39s   Esophageal cancer Neg Hx    Rectal cancer Neg Hx    Stomach cancer Neg Hx    Colon polyps Neg Hx    Social History   Tobacco Use   Smoking status: Former    Packs/day: 1.00    Years: 45.00    Pack years: 45.00    Types: Cigarettes    Start date: 1955    Quit date: 12/18/1995    Years since quitting: 26.0   Smokeless tobacco: Never  Substance Use Topics   Alcohol use: No    Comment: quit in 1980   Marital Status: Married  ROS   Review of Systems  Constitutional: Positive for malaise/fatigue. Negative for weight gain.   Cardiovascular:  Positive for leg swelling (intermittent) and palpitations. Negative for chest pain, claudication, near-syncope, orthopnea, paroxysmal nocturnal dyspnea and syncope.  Respiratory:  Negative for shortness of breath.   Neurological:  Negative for dizziness.   Objective:  Blood pressure 114/68, pulse (!) 53, temperature 98 F (36.7 C), temperature source Temporal, height 5\' 11"  (1.803 m), weight 241 lb (109.3 kg), SpO2 96 %. Body mass index is 33.61 kg/m.  Vitals with BMI 12/28/2021  09/29/2021 09/29/2021  Height 5\' 11"  - -  Weight 241 lbs - -  BMI 30.86 - -  Systolic [phone]  Diastolic 68 73 72  Pulse 53 66 64   Physical Exam Vitals reviewed.  Constitutional:      Appearance: He is well-developed.  Neck:     Vascular: No carotid bruit.  Cardiovascular:     Rate and Rhythm: Normal rate and regular rhythm.     Pulses: Intact distal pulses.          Femoral pulses are 2+ on the right side and 2+ on the left side.      Popliteal pulses are 1+ on the right side and 1+ on the left side.       Dorsalis pedis pulses are 1+ on the right side and 1+ on the left side.       Posterior tibial pulses are 2+ on the right side and 2+ on the left side.     Heart sounds: Normal heart sounds, S1 normal and S2 normal. No murmur heard.   No gallop.  Pulmonary:     Effort: Pulmonary effort is normal. No accessory muscle usage or respiratory distress.     Breath sounds: Normal breath sounds. No wheezing, rhonchi or rales.  Musculoskeletal:     Right lower leg: No edema.     Left lower leg: No edema.  Neurological:     Mental Status: He is alert.   Laboratory examination:   CMP Latest Ref Rng & Units 10/11/2021 06/13/2021 06/13/2021  Glucose 70 - 99 mg/dL 113(H) - -  BUN 8 - 27 mg/dL 12 - -  Creatinine 0.76 - 1.27 mg/dL 1.32(H) - -  Sodium 134 - 144 mmol/L 140 140 141  Potassium 3.5 - 5.2 mmol/L 4.1 3.8 3.8  Chloride 96 - 106 mmol/L 102 - -  CO2 20 - 29 mmol/L 22 - -   Calcium 8.6 - 10.2 mg/dL 9.2 - -  Total Protein 6.5 - 8.1 g/dL - - -  Total Bilirubin 0.3 - 1.2 mg/dL - - -  Alkaline Phos 38 - 126 U/L - - -  AST 15 - 41 U/L - - -  ALT 0 - 44 U/L - - -   CBC Latest Ref Rng & Units 06/13/2021 06/13/2021 06/13/2021  WBC 3.4 - 10.8 x10E3/uL - - -  Hemoglobin 13.0 - 17.0 g/dL 13.9 13.9 13.6  Hematocrit 39.0 - 52.0 % 41.0 41.0 40.0  Platelets 150 - 450 x10E3/uL - - -   Lipid Panel     Component Value Date/Time   CHOL 134 09/08/2015 1210   TRIG 211.0 (H) 09/08/2015 1210   HDL 36.10 (L) 09/08/2015 1210   CHOLHDL 4 09/08/2015 1210   VLDL 42.2 (H) 09/08/2015 1210   LDLCALC 59 07/23/2014 1638   LDLDIRECT 87.0 09/08/2015 1210   HEMOGLOBIN A1C No results found for: HGBA1C, MPG TSH Recent Labs    03/25/21 1443  TSH 0.630    External Labs: 08/07/2021: HDL 36, LDL 49, total cholesterol 109, triglycerides 134 A1c 6.4% BUN 15, creatinine 1.3, GFR 55 TSH 0.63  Cholesterol, total 128.000 m 06/22/2020 HDL 34.000 mg 06/22/2020 LDL-C 63.000 mg 06/22/2020 Triglycerides 183.000 m 06/22/2020  Hemoglobin 14.900 g/d 06/22/2020  Creatinine, Serum 1.250 mg/ 06/22/2020 Potassium 3.900 mm 06/22/2020 ALT (SGPT) 30.000 U/L 06/22/2020  TSH 2.120 06/22/2020   Allergies   Allergies  Allergen Reactions   Morphine Sulfate Itching   Rosuvastatin Calcium Other (See Comments)  Muscle weakness   Tramadol Itching    Other reaction(s): Unknown    Medications Prior to Visit:   Outpatient Medications Prior to Visit  Medication Sig Dispense Refill   albuterol (PROVENTIL) (2.5 MG/3ML) 0.083% nebulizer solution USE 1 VIAL IN NEBULIZER EVERY 6 HOURS AS NEEDED FOR WHEEZING OR SHORTNESS OF BREATH 75 mL 3   albuterol (VENTOLIN HFA) 108 (90 Base) MCG/ACT inhaler INHALE 2 PUFFS BY MOUTH EVERY 6 HOURS AS NEEDED FOR WHEEZING OR SHORTNESS OF BREATH 9 g 1   atorvastatin (LIPITOR) 20 MG tablet Take 20 mg by mouth at bedtime.   5   ciclopirox (PENLAC) 8 % solution Apply topically at  bedtime. Apply over nail and surrounding skin. Apply daily over previous coat. After seven (7) days, may remove with alcohol and continue cycle. 6.6 mL 2   clonazePAM (KLONOPIN) 1 MG tablet Take 1 mg by mouth at bedtime as needed (sleep).     ELIQUIS 5 MG TABS tablet Take 1 tablet by mouth twice daily 180 tablet 1   fluticasone (FLONASE) 50 MCG/ACT nasal spray Place 2 sprays into both nostrils daily. 16 g 2   gabapentin (NEURONTIN) 100 MG capsule Take 100-200 mg by mouth See admin instructions. Take 200 mg am & 100 mg at bedtime     guaiFENesin (MUCINEX) 600 MG 12 hr tablet Take 600 mg by mouth daily as needed (bronchitis).     HYDROcodone-acetaminophen (NORCO/VICODIN) 5-325 MG tablet Take 1 tablet by mouth every 6 (six) hours as needed for moderate pain.     isosorbide mononitrate (IMDUR) 30 MG 24 hr tablet Take 1 tablet by mouth once daily 90 tablet 1   Magnesium 200 MG TABS Take 200 mg by mouth in the morning.     metoprolol tartrate (LOPRESSOR) 50 MG tablet Take 1 tablet (50 mg total) by mouth 2 (two) times daily. 180 tablet 3   Multiple Vitamin (MULTIVITAMIN WITH MINERALS) TABS tablet Take 1 tablet by mouth in the morning.     nitroGLYCERIN (NITROSTAT) 0.4 MG SL tablet Place 1 tablet (0.4 mg total) under the tongue every 5 (five) minutes as needed for chest pain. 25 tablet 3   Omega-3 Fatty Acids (FISH OIL) 1000 MG CAPS Take 1,000 mg by mouth in the morning.     omeprazole (PRILOSEC) 20 MG capsule Take 1 capsule (20 mg total) by mouth daily. 30 capsule 3   Phenylephrine-DM-GG (ROBITUSSIN COUGH/COLD CF MAX PO) Take 20 mLs by mouth every 6 (six) hours as needed (cough).     Potassium Chloride ER 20 MEQ TBCR Take 20 mEq by mouth in the morning.     STIOLTO RESPIMAT 2.5-2.5 MCG/ACT AERS INHALE 2 PUFFS BY MOUTH ONCE DAILY 4 g 5   tamsulosin (FLOMAX) 0.4 MG CAPS capsule Take 0.8 mg by mouth at bedtime.     tiZANidine (ZANAFLEX) 4 MG tablet Take 4 mg by mouth at bedtime as needed for muscle spasms.      torsemide (DEMADEX) 20 MG tablet Take 40 mg by mouth in the morning.     No facility-administered medications prior to visit.   Final Medications at End of Visit    Current Meds  Medication Sig   albuterol (PROVENTIL) (2.5 MG/3ML) 0.083% nebulizer solution USE 1 VIAL IN NEBULIZER EVERY 6 HOURS AS NEEDED FOR WHEEZING OR SHORTNESS OF BREATH   albuterol (VENTOLIN HFA) 108 (90 Base) MCG/ACT inhaler INHALE 2 PUFFS BY MOUTH EVERY 6 HOURS AS NEEDED FOR WHEEZING OR SHORTNESS OF BREATH  atorvastatin (LIPITOR) 20 MG tablet Take 20 mg by mouth at bedtime.    ciclopirox (PENLAC) 8 % solution Apply topically at bedtime. Apply over nail and surrounding skin. Apply daily over previous coat. After seven (7) days, may remove with alcohol and continue cycle.   clonazePAM (KLONOPIN) 1 MG tablet Take 1 mg by mouth at bedtime as needed (sleep).   ELIQUIS 5 MG TABS tablet Take 1 tablet by mouth twice daily   fluticasone (FLONASE) 50 MCG/ACT nasal spray Place 2 sprays into both nostrils daily.   gabapentin (NEURONTIN) 100 MG capsule Take 100-200 mg by mouth See admin instructions. Take 200 mg am & 100 mg at bedtime   guaiFENesin (MUCINEX) 600 MG 12 hr tablet Take 600 mg by mouth daily as needed (bronchitis).   HYDROcodone-acetaminophen (NORCO/VICODIN) 5-325 MG tablet Take 1 tablet by mouth every 6 (six) hours as needed for moderate pain.   isosorbide mononitrate (IMDUR) 30 MG 24 hr tablet Take 1 tablet by mouth once daily   Magnesium 200 MG TABS Take 200 mg by mouth in the morning.   metoprolol tartrate (LOPRESSOR) 50 MG tablet Take 1 tablet (50 mg total) by mouth 2 (two) times daily.   Multiple Vitamin (MULTIVITAMIN WITH MINERALS) TABS tablet Take 1 tablet by mouth in the morning.   nitroGLYCERIN (NITROSTAT) 0.4 MG SL tablet Place 1 tablet (0.4 mg total) under the tongue every 5 (five) minutes as needed for chest pain.   Omega-3 Fatty Acids (FISH OIL) 1000 MG CAPS Take 1,000 mg by mouth in the morning.    omeprazole (PRILOSEC) 20 MG capsule Take 1 capsule (20 mg total) by mouth daily.   Phenylephrine-DM-GG (ROBITUSSIN COUGH/COLD CF MAX PO) Take 20 mLs by mouth every 6 (six) hours as needed (cough).   Potassium Chloride ER 20 MEQ TBCR Take 20 mEq by mouth in the morning.   STIOLTO RESPIMAT 2.5-2.5 MCG/ACT AERS INHALE 2 PUFFS BY MOUTH ONCE DAILY   tamsulosin (FLOMAX) 0.4 MG CAPS capsule Take 0.8 mg by mouth at bedtime.   tiZANidine (ZANAFLEX) 4 MG tablet Take 4 mg by mouth at bedtime as needed for muscle spasms.   torsemide (DEMADEX) 20 MG tablet Take 40 mg by mouth in the morning.   Radiology    Chest x-ray 03/25/2021: 1. Low volume film with cardiomegaly and vascular congestion. 2. Patchy airspace disease in the left base compatible with atelectasis or pneumonia. 3. Tiny nodular density peripheral right mid lung is stable, suggesting benign etiology.  CTA of chest 09/16/2019:  4.4 cm ascending thoracic aortic aneurysm. Recommend annual imaging followup by CTA or MRA. This recommendation follows 2010 ACCF/AHA/AATS/ACR/ASA/SCA/SCAI/SIR/STS/SVM Guidelines for the Diagnosis and Management of Patients with Thoracic Aortic Disease. Circulation. 2010; 121: Z001-V494. Aortic aneurysm NOS (ICD10-I71.9)   Old granulomatous disease. No acute cardiopulmonary disease. Aortic Atherosclerosis (ICD10-I70.0).  Cardiac Studies:   CABG 2007: LIMA to LAD, SVG to D2, SVG to OM1 and ramus intermediate with OM1 limb occluded, SVG to RCA with a proximal 50% stenosis by coronary angiography in July 2018. Normal LVEF.  Lexiscan sestamibi stress test 12/03/2016: No significant ST-T wave changes during stress test. Medium defect of moderate intensity in the basal inferior, mid inferoseptal, mid inferior and mid inferolateral and apical inferior and apical lateral location consistent with Myocardial infarction mild degree of peri-infarct ischemia. LVEF 52%. Intermediate risk study.  Coronary angiogram 10/08/2018 and  had a very high-grade stenosis of a large hiatal D1/ramus branch S/P 3.0 x 23 mm Sierra Xience DES. Patent  LIMA to LAD, SVG to D2 and SVG to RCA widely patent. Normal right heart catheterization, preserved cardiac output and cardiac index.  Echocardiogram 03/26/2021: 1. Left ventricular ejection fraction, by estimation, is 35 to 40%. The  left ventricle has moderately decreased function. The left ventricle  demonstrates global hypokinesis. There is mild left ventricular  hypertrophy. Left ventricular diastolic  parameters are indeterminate.   2. Right ventricular systolic function is normal. The right ventricular  size is normal.   3. Left atrial size was mildly dilated.   4. The mitral valve is normal in structure. No evidence of mitral valve  regurgitation. No evidence of mitral stenosis.   5. The aortic valve is tricuspid. There is mild calcification of the  aortic valve. Aortic valve regurgitation is trivial. Mild aortic valve  sclerosis is present, with no evidence of aortic valve stenosis.   6. The inferior vena cava is normal in size with greater than 50%  respiratory variability, suggesting right atrial pressure of 3 mmHg.   TEE 05/23/2021:  1. Left ventricular ejection fraction, by estimation, is 35 to 40%. The left ventricle has moderately decreased function. The left ventricle demonstrates global hypokinesis.   2. Right ventricular systolic function is moderately reduced. The right ventricular size is moderately enlarged.   3. Left atrial size was severely dilated. No left atrial/left atrial appendage thrombus was detected.   4. Right atrial size was moderately dilated.   5. The mitral valve is grossly normal. Mild mitral valve regurgitation.   6. The aortic valve is tricuspid. Aortic valve regurgitation is not visualized.   7. Evidence of atrial level shunting detected by color flow Doppler. Septum primum, as well as septum secundum defects seen. 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 Multiple ASD's are noted.   8. No prior TEE for comparison.   EKG:  12/28/2021: Sinus rhythm with first-degree AV block and PVCs with sinus arrhythmia and a rate of 62 bpm.  Normal axis.  Right bundle branch block.  09/27/2021: Sinus rhythm with first-degree AV block at a rate of 75 bpm.  Normal axis.  Right bundle branch block.  Compared to EKG 06/27/2021, no significant change.  05/03/2021: Atrial fibrillation with rapid ventricular response at a rate of 103 bpm.  Normal axis.  Right bundle branch block, secondary ST-T wave changes.  Compared to EKG 03/30/2021, ventricular rate controlled improved otherwise no significant change.  ED EKG 03/25/2021: Atrial flutter with 4-1 AV block at a rate of 128 bpm.  10/12/2020: Sinus rhythm with  first-degree AV block at rate of 82 bpm, normal axis, right bundle branch block.   Assessment:     ICD-10-CM   1. Palpitations  R00.2 LONG TERM MONITOR (3-14 DAYS)    2. Paroxysmal atrial fibrillation (HCC)  I48.0 EKG 12-Lead    LONG TERM MONITOR (3-14 DAYS)    3. Coronary artery disease involving coronary bypass graft of native heart without angina pectoris  I25.810 EKG 12-Lead    4. PVC (premature ventricular contraction)  I49.3 LONG TERM MONITOR (3-14 DAYS)      No orders of the defined types were placed in this encounter.   There are no discontinued medications.  This patients CHA2DS2-VASc Score 5 (CHF, HTN, vasc, age) and yearly risk of stroke 7.2%.   Recommendations:   HOVANES HYMAS  is a 81 y.o. with known coronary artery disease and CABG in 2007 in the setting of non-STEMI in  atrial fibrillation with RVR. He has a history of  45 pack year h/o smoking tobacco, ascending aortic aneurysm (follows Dr.Bryan Bartle), small abdominal aortic aneurysm noted in 2015 (3cm), H/O colon cancer, in remission since 1992, GERD, hyperlipidemia, obstructive sleep apnea now on CPAP.   Patient  hospitalized 03/2021 with recurrence of atrial flutter and echocardiogram revealed new onset LVEF 35-40% with global hypokinesis. Patient underwent successful cardioversion 05/23/2021 with return to normal sinus rhythm.  TEE prior to cardioversion noted ASD, which was subsequently repaired by Dr. Einar Gip 06/13/2021.  Patient was last seen in our office 09/27/2021 with worsening leg edema, which time ordered BNP which was normal and BMP which noted mild increase in creatinine, therefore he was advised to have repeat BMP but unfortunately this has not been done.  Also last office visit ordered repeat echocardiogram, however this is scheduled for April.   Patient now presents at his request for urgent visit with concerns of bradycardia.  Given underlying PVCs and patient's history of atrial fibrillation as he is currently having brief episodes of palpitations and fatigue will obtain 2-week ambulatory telemetry to further evaluate.  Will not make changes at today's office visit, pending results of cardiac monitor.  Patient is also scheduled for repeat echocardiogram.  Patient continues to tolerate anticoagulation without bleeding diathesis and will evaluate for recurrence of atrial fibrillation/flutter with monitor.  Patient's blood pressure is well controlled.  Lipids were also well controlled at last check.  Follow-up in 4 weeks, sooner if needed, for results of cardiac testing.   Alethia Berthold, PA-C 12/28/2021, 1:20 PM Office: [phone]

## 2022-01-01 NOTE — Progress Notes (Signed)
Office Visit Note  Patient: [name]             Date of Birth: February 25, 1941           MRN: [id number]             PCP: Shirline Frees, MD Referring: Shirline Frees, MD Visit Date: 01/02/2022 Occupation: Retired Oceanographer  Subjective:  New Patient (Initial Visit) (Left wrist pain and swelling)   History of Present Illness: VAIBHAV Bennett is a 81 y.o. male here for evaluation of joint pain in multiple areas. He had onset of left wrist swelling 09/16/21 with xrays obtained and followup with Dr. Fredna Dow on 09/27/21. Pain improved somewhat over time despite persistent wrist swelling. Wrist pain is sharp or stabbing in character and worse when moving his wrist especially flexing or extending the wrist. He has chronic numbness in the tip of his 4th finger but this is not new. 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 Labs so far show uric acid in normal range at 6.3 and xray of the left wrist with degenerative changes seen and swelling, no erosions. He also developed pain and swelling in the right 5th toe this completely resolved over time after the initial period. He takes low dose hydrocodone chronically for back pain. He does not tolerate oral steroids well, with cardiac arrhythmia earlier last year after steroids for pulmonary symptoms. He cannot take NSAIDs safely on chronic anticoagulation.  Labs reviewed Uric acid 6.3  Imaging reviewed 09/29/21 Xray left wrist Degenerative changes throughout, no erosions or acute injury changes  Activities of Daily Living:  Patient reports morning stiffness for 5 minutes.   Patient Reports nocturnal pain.  Difficulty dressing/grooming: Denies Difficulty climbing stairs: Denies Difficulty getting out of chair: Reports Difficulty using hands for taps, buttons, cutlery, and/or writing: Denies  Review of Systems  Constitutional:  Positive for fatigue.  HENT:  Negative for mouth dryness.   Eyes:   Positive for dryness.  Respiratory:  Positive for shortness of breath.   Cardiovascular:  Positive for swelling in legs/feet.  Gastrointestinal:  Negative for constipation.  Endocrine: Negative for excessive thirst.  Genitourinary:  Negative for difficulty urinating.  Musculoskeletal:  Positive for joint pain, gait problem, joint pain, joint swelling, muscle weakness and morning stiffness.  Skin:  Negative for rash.  Allergic/Immunologic: Negative for susceptible to infections.  Neurological:  Positive for numbness and weakness.  Hematological:  Positive for bruising/bleeding tendency.  Psychiatric/Behavioral:  Negative for sleep disturbance.    PMFS History:  Patient Active Problem List   Diagnosis Date Noted   Pain in left wrist 01/02/2022   Joint swelling 01/02/2022   Abdominal aortic aneurysm without rupture 11/14/2021   Anxiety 11/14/2021   Balanitis 11/14/2021   Chronic kidney disease, stage 3a ([place]) 11/14/2021   Chronic pain 11/14/2021   Enlarged prostate 11/14/2021   Exacerbation of intermittent asthma 11/14/2021   Extrapyramidal and movement disorder, 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   Sciatica 11/14/2021   Simple chronic bronchitis ([place]) 11/14/2021   Secondary right ventricular dilation    Atrial septal defect 06/12/2021   Acute on chronic combined systolic and diastolic CHF (congestive heart failure) --EF 35 to 40% 03/26/2021   New onset a-fib ([place]) 03/25/2021   Paroxysmal A-fib with RVR 03/25/2021   History of  deep vein thrombosis (DVT)/H/o Provoked DVT in 2007 --after CABG,  03/25/2021   Low back pain 10/20/2020   Pain of right hip joint 10/20/2020  Pneumonia due to COVID-19 virus 10/04/2020   Genetic testing 75/91/6384   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   Family history of colon cancer    Family history of breast cancer    Family history of lung cancer    Allergic rhinitis 07/21/2019   Nasal obstruction 01/22/2019   Post PTCA 10/07/2018   Enrolled in  clinical trial of drug 10/07/2018   COPD (chronic obstructive pulmonary disease) ([place]) 08/08/2018   CAD-  S/P stenting of high OM1/ramus intermediate with 3.0 x 23 mm Xience Sierra DES/S/p CABG 07/03/2017   Abnormal nuclear stress test 07/03/2017   Weakness 06/13/2017   Bad posture 09/08/2014   Leg weakness, bilateral 09/08/2014   Anemia 03/07/2012   Other general symptoms(780.99) 03/07/2012   GERD (gastroesophageal reflux disease) 03/07/2012   Personal history of malignant neoplasm of rectum, rectosigmoid junction, and anus 03/07/2012   Hx of CABG 03/07/2012   Chronic diastolic heart failure ([place]) 12/06/2011   Fatigue 04/03/2011   Aortic aneurysm ([place]) 04/03/2011   SYNCOPE 02/21/2011   PALPITATIONS 02/21/2011   BRADYCARDIA 01/22/2011   ATRIAL FIBRILLATION 01/23/2010   Obstructive sleep apnea 01/16/2010   Dyspnea on exertion 05/12/2009   HYPERCHOLESTEROLEMIA 03/22/2009   HYPERLIPIDEMIA 03/22/2009   OBESITY 03/22/2009   Essential hypertension 03/22/2009   Coronary atherosclerosis 03/22/2009   BUNDLE BRANCH BLOCK, RIGHT 03/22/2009   GERD 03/22/2009   BPH (benign prostatic hyperplasia) 03/22/2009   Personal history of other specified diseases(V13.89) 03/22/2009   Personal history of colonic polyps 01/22/2000    Past Medical History:  Diagnosis Date   Allergy    Arthritis    BPH (benign prostatic hyperplasia)    CAD (coronary artery disease)    s/p CABG 2007; NSTEMI in setting of AFib with RVR 12/2009; cath 1/11: S-RCA ok with prox 30-40% and 50% stenoses; S-OM and RI  with patent OM limb but an occluded RI limb; S-D2 and L-LAD ok; EF 55%   Cataract    removed both eyes    Clotting disorder (HCC)    s/p CABG x 5- PE    Colon cancer ([place]) 1992   COPD (chronic obstructive pulmonary disease) ([place])    Diverticulosis    Essential hypertension    Family history of breast cancer    Family history of colon cancer    Family history of lung cancer    GERD (gastroesophageal reflux  disease)    History of pulmonary embolus ([place]) 2007   Following CABG   Hyperlipidemia    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HCC)    Right upper   OSA on CPAP    Paroxysmal atrial fibrillation (HCC)    Previously on Multaq, declines anticoagulation   Personal history of colonic polyps 01/22/2000   Tubular adenoma   RBBB (right bundle branch block)    Sleep apnea    no cpap    Squamous cell carcinoma of skin 02/11/2008   [place]-Left paraspinal, lower    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   in situ-left forearm    Family History  Problem Relation Age of Onset   Lung cancer Mother    Heart disease Mother    Emphysema Father    Heart disease Father    Cancer Sister 91       unknown type   Breast cancer Sister        dx. >50   Colon cancer Sister        dx. >50   Breast cancer Sister  dx. >50   Colon cancer Sister        dx. >50   Colon cancer Cousin        dx. early 45s   Esophageal cancer Neg Hx    Rectal cancer Neg Hx    Stomach cancer Neg Hx    Colon polyps Neg Hx    Past Surgical History:  Procedure Laterality Date   CARDIOVERSION N/A 05/23/2021   Procedure: CARDIOVERSION;  Surgeon: Elder Negus, MD;  Location: [facility] ENDOSCOPY;  Service: Cardiovascular;  Laterality: N/A;   COLECTOMY  1992   w/ colostomy  related to colon cancer   COLON SURGERY  1993   to take down Colostomy   COLONOSCOPY     CORONARY ARTERY BYPASS GRAFT  2007   CORONARY STENT INTERVENTION N/A 10/07/2018   Procedure: CORONARY STENT INTERVENTION;  Surgeon: Yates Decamp, MD;  Location: [facility] INVASIVE CV LAB;  Service: Cardiovascular;  Laterality: N/A;   HAND LIGAMENT RECONSTRUCTION  1963 1964   tendon repair and nerve; left   INGUINAL HERNIA REPAIR  2009   LAPAROSCOPIC CHOLECYSTECTOMY  2004   PATENT FORAMEN OVALE(PFO) CLOSURE N/A 06/13/2021   Procedure: PATENT FORAMEN OVALE (PFO) CLOSURE;  Surgeon: Yates Decamp, MD;  Location: [facility] INVASIVE CV LAB;  Service: Cardiovascular;  Laterality: N/A;   POLYPECTOMY      RIGHT HEART CATH N/A 06/13/2021   Procedure: RIGHT HEART CATH;  Surgeon: Yates Decamp, MD;  Location: [facility] INVASIVE CV LAB;  Service: Cardiovascular;  Laterality: N/A;   RIGHT/LEFT HEART CATH AND CORONARY/GRAFT ANGIOGRAPHY N/A 07/03/2017   Procedure: Right/Left Heart Cath and Coronary/Graft Angiography;  Surgeon: Marykay Lex, MD;  Location: [facility] INVASIVE CV LAB;  Service: Cardiovascular;  Laterality: N/A;   RIGHT/LEFT HEART CATH AND CORONARY/GRAFT ANGIOGRAPHY N/A 10/07/2018   Procedure: RIGHT/LEFT HEART CATH AND CORONARY/GRAFT ANGIOGRAPHY;  Surgeon: Yates Decamp, MD;  Location: [facility] INVASIVE CV LAB;  Service: Cardiovascular;  Laterality: N/A;   TEE WITHOUT CARDIOVERSION  05/23/2021   Procedure: TRANSESOPHAGEAL ECHOCARDIOGRAM (TEE);  Surgeon: Elder Negus, MD;  Location: [facility] ENDOSCOPY;  Service: Cardiovascular;;   UMBILICAL HERNIA REPAIR     VENTRAL HERNIA REPAIR  2009   Social History   Social History Narrative   Daily caffeine    Immunization History  Administered Date(s) Administered   Influenza Split 10/25/1997, 11/10/2001, 09/08/2008, 06/25/2012, 09/22/2013, 10/21/2018   Influenza Whole 10/17/2009   Influenza, High Dose Seasonal PF 09/29/2013, 11/16/2015, 10/21/2018, 10/06/2019, 09/30/2020   Influenza,inj,Quad PF,6+ Mos 11/24/2014   Influenza-Unspecified 11/05/2012   Moderna Sars-Covid-2 Vaccination 12/29/2019, 01/29/2020, 01/24/2021   Pneumococcal Conjugate-13 05/17/2014   Pneumococcal Polysaccharide-23 10/17/2005, 11/21/2005   Td 05/17/2014   Zoster Recombinat (Shingrix) 05/14/2018, 08/08/2018   Zoster, Live 05/14/2018     Objective: Vital Signs: BP 130/62 (BP Location: Right Arm, Patient Position: Sitting, Cuff Size: Normal)    Pulse 98    Resp 17    Ht 5\' 11"  (1.803 m)    Wt 245 lb (111.1 kg)    BMI 34.17 kg/m    Physical Exam Constitutional:      Appearance: He is obese.  Eyes:     Conjunctiva/sclera: Conjunctivae normal.  Cardiovascular:     Rate and Rhythm: Normal  rate and regular rhythm.  Pulmonary:     Effort: Pulmonary effort is normal.     Breath sounds: Normal breath sounds.  Musculoskeletal:     Right lower leg: No edema.     Left lower leg: No edema.  Skin:  General: Skin is warm and dry.  Neurological:     Mental Status: He is alert.  Psychiatric:        Mood and Affect: Mood normal.     Musculoskeletal Exam:  Neck full ROM no tenderness Shoulders full ROM no tenderness or swelling Elbows full ROM no tenderness or swelling Right wrist normal, left wrist swelling present on dorsal side at radial head, pain with flexion and extension no pain with ulnar or radial directions of movement Right index finger distal segment amputated, left hand with surgical scar decreased ROM in several digits with mild contracture Knees full ROM no tenderness or swelling Ankles full ROM no tenderness or swelling  Limited ultrasound exam of left wrist demonstrating mild synovial thickening and color doppler enhancement at dorsum of radiocarpal joint  Investigation: No additional findings.  Imaging: No results found.  Recent Labs: Lab Results  Component Value Date   WBC 6.0 06/08/2021   HGB 13.9 06/13/2021   PLT 209 06/08/2021   NA 140 10/11/2021   K 4.1 10/11/2021   CL 102 10/11/2021   CO2 22 10/11/2021   GLUCOSE 113 (H) 10/11/2021   BUN 12 10/11/2021   CREATININE 1.32 (H) 10/11/2021   BILITOT 0.5 03/25/2021   ALKPHOS 54 03/25/2021   AST 28 03/25/2021   ALT 34 03/25/2021   PROT 7.2 03/25/2021   ALBUMIN 3.7 03/25/2021   CALCIUM 9.2 10/11/2021   GFRAA >60 10/08/2018    Speciality Comments: No specialty comments available.  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 Uric acid not high and no gout history and atypical picture. Not able to take oral antiinflammatory medicine safely. Checking for RA serology and inflammatory markers. If labs are positive will f/u to discuss options if negative and swelling remains may benefit with more advanced imaging.  Joint swelling  Persistent swelling at left wrist still ongoing today right toe is resolved and no new joints.  Orders: Orders Placed This Encounter  Procedures   Rheumatoid factor   Cyclic citrul peptide antibody, IgG   Sedimentation rate   C-reactive protein   No orders of the defined types were placed in this encounter.    Follow-Up Instructions: Return for New pt ?RA f/u.   Collier Salina, MD  Note - This record has been created using [employer].  Chart creation errors have been sought, but may not always  have been located. Such creation errors do not reflect on  the standard of medical care.

## 2022-01-02 ENCOUNTER — Encounter: Payer: Self-pay | Admitting: Internal Medicine

## 2022-01-02 ENCOUNTER — Other Ambulatory Visit: Payer: Self-pay

## 2022-01-02 ENCOUNTER — Ambulatory Visit: Payer: Medicare PPO | Admitting: Internal Medicine

## 2022-01-02 VITALS — BP 130/62 | HR 98 | Resp 17 | Ht 71.0 in | Wt 245.0 lb

## 2022-01-02 DIAGNOSIS — M25532 Pain in left wrist: Secondary | ICD-10-CM

## 2022-01-02 DIAGNOSIS — M2540 Effusion, unspecified joint: Secondary | ICD-10-CM

## 2022-01-03 LAB — CYCLIC CITRUL PEPTIDE ANTIBODY, IGG: Cyclic Citrullin Peptide Ab: <16 UNITS

## 2022-01-03 LAB — C-REACTIVE PROTEIN: CRP: 2.5 mg/L (ref <8.0)

## 2022-01-03 LAB — RHEUMATOID FACTOR: Rheumatoid fact SerPl-aCnc: <14 IU/mL (ref <14)

## 2022-01-03 LAB — SEDIMENTATION RATE: Sed Rate: 2 mm/h (ref 0–20)

## 2022-01-05 DIAGNOSIS — I480 Paroxysmal atrial fibrillation: Secondary | ICD-10-CM | POA: Diagnosis not present

## 2022-01-05 DIAGNOSIS — R002 Palpitations: Secondary | ICD-10-CM | POA: Diagnosis not present

## 2022-01-05 DIAGNOSIS — I493 Ventricular premature depolarization: Secondary | ICD-10-CM | POA: Diagnosis not present

## 2022-01-18 DIAGNOSIS — I493 Ventricular premature depolarization: Secondary | ICD-10-CM | POA: Diagnosis not present

## 2022-01-18 DIAGNOSIS — R002 Palpitations: Secondary | ICD-10-CM | POA: Diagnosis not present

## 2022-01-18 DIAGNOSIS — I480 Paroxysmal atrial fibrillation: Secondary | ICD-10-CM | POA: Diagnosis not present

## 2022-01-23 ENCOUNTER — Ambulatory Visit: Payer: Medicare PPO | Admitting: Emergency Medicine

## 2022-01-23 ENCOUNTER — Encounter: Payer: Self-pay | Admitting: Emergency Medicine

## 2022-01-23 ENCOUNTER — Other Ambulatory Visit: Payer: Self-pay

## 2022-01-23 DIAGNOSIS — G4733 Obstructive sleep apnea (adult) (pediatric): Secondary | ICD-10-CM | POA: Diagnosis not present

## 2022-01-23 DIAGNOSIS — R0609 Other forms of dyspnea: Secondary | ICD-10-CM | POA: Diagnosis not present

## 2022-01-23 DIAGNOSIS — J449 Chronic obstructive pulmonary disease, unspecified: Secondary | ICD-10-CM

## 2022-01-23 MED ORDER — ALBUTEROL SULFATE (2.5 MG/3ML) 0.083% IN NEBU: INHALATION_SOLUTION | RESPIRATORY_TRACT | 3 refills | Status: DC

## 2022-01-23 NOTE — Progress Notes (Signed)
Office Visit Note  Patient: [name]             Date of Birth: 1941-09-24           MRN: [id number]             PCP: Johny Blamer, MD Referring: Johny Blamer, MD Visit Date: 01/24/2022   Subjective:  Follow-up (Left wrist pain and swelling)   History of Present Illness: [name] is a 81 y.o. male here for follow up for joint pain and swelling lab testing at initial visit negative for RA antibodies and normal inflammatory markers. His left wrist continues to be painful and limiting some routine activities, such as washing his face due to joint pain. He rates symptoms severity as around 6-8 in this area at times.  Previous HPI 01/02/22 [name] is a 81 y.o. male here for evaluation of joint pain in multiple areas. He had onset of left wrist swelling 09/16/21 with xrays obtained and followup with Dr. Merlyn Lot on 09/27/21. Pain improved somewhat over time despite persistent wrist swelling. Wrist pain is sharp or stabbing in character and worse when moving his wrist especially flexing or extending the wrist. He has chronic numbness in the tip of his 4th finger but this is not new. 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 Labs so far show uric acid in normal range at 6.3 and xray of the left wrist with degenerative changes seen and swelling, no erosions. He also developed pain and swelling in the right 5th toe this completely resolved over time after the initial period. He takes low dose hydrocodone chronically for back pain. He does not tolerate oral steroids well, with cardiac arrhythmia earlier last year after steroids for pulmonary symptoms. He cannot take NSAIDs safely on chronic anticoagulation.   Review of Systems  Constitutional:  Positive for fatigue.  HENT:  Positive for mouth dryness.   Eyes:  Positive for dryness.  Respiratory:  Positive for shortness of breath.   Cardiovascular:  Positive for swelling in legs/feet.   Gastrointestinal:  Negative for constipation.  Endocrine: Negative for increased urination.  Genitourinary:  Negative for difficulty urinating.  Musculoskeletal:  Positive for joint pain, gait problem, joint pain, joint swelling, muscle weakness and morning stiffness.  Skin:  Negative for rash.  Allergic/Immunologic: Negative for susceptible to infections.  Neurological:  Positive for numbness and weakness.  Hematological:  Positive for bruising/bleeding tendency.  Psychiatric/Behavioral:  Negative for sleep disturbance.    PMFS History:  Patient Active Problem List   Diagnosis Date Noted   Pain in left wrist 01/02/2022   Joint swelling 01/02/2022   Abdominal aortic aneurysm without rupture 11/14/2021   Anxiety 11/14/2021   Balanitis 11/14/2021   Chronic kidney disease, stage 3a (HCC) 11/14/2021   Chronic pain 11/14/2021   Enlarged prostate 11/14/2021   Exacerbation of intermittent asthma 11/14/2021   Extrapyramidal and movement disorder, 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   Sciatica 11/14/2021   Simple chronic bronchitis (HCC) 11/14/2021   Secondary right ventricular dilation    Atrial septal defect 06/12/2021   Acute on chronic combined systolic and diastolic CHF (congestive heart failure) --EF 35 to 40% 03/26/2021   New onset a-fib (HCC) 03/25/2021   Paroxysmal A-fib with RVR 03/25/2021   History of  deep vein thrombosis (DVT)/H/o Provoked DVT in 2007 --after CABG,  03/25/2021   Low back pain 10/20/2020   Pain of right hip joint 10/20/2020   Pneumonia due  to COVID-19 virus 10/04/2020   Genetic testing 98/92/1194   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   Family history of colon cancer    Family history of breast cancer    Family history of lung cancer    Allergic rhinitis 07/21/2019   Nasal obstruction 01/22/2019   Post PTCA 10/07/2018   Enrolled in clinical trial of drug 10/07/2018   COPD (chronic obstructive pulmonary disease) ([place]) 08/08/2018   CAD-  S/P stenting of  high OM1/ramus intermediate with 3.0 x 23 mm Xience Sierra DES/S/p CABG 07/03/2017   Abnormal nuclear stress test 07/03/2017   Weakness 06/13/2017   Bad posture 09/08/2014   Leg weakness, bilateral 09/08/2014   Anemia 03/07/2012   Other general symptoms(780.99) 03/07/2012   GERD (gastroesophageal reflux disease) 03/07/2012   Personal history of malignant neoplasm of rectum, rectosigmoid junction, and anus 03/07/2012   Hx of CABG 03/07/2012   Chronic diastolic heart failure ([place]) 12/06/2011   Fatigue 04/03/2011   Aortic aneurysm ([place]) 04/03/2011   SYNCOPE 02/21/2011   PALPITATIONS 02/21/2011   BRADYCARDIA 01/22/2011   ATRIAL FIBRILLATION 01/23/2010   Obstructive sleep apnea 01/16/2010   Dyspnea on exertion 05/12/2009   HYPERCHOLESTEROLEMIA 03/22/2009   HYPERLIPIDEMIA 03/22/2009   OBESITY 03/22/2009   Essential hypertension 03/22/2009   Coronary atherosclerosis 03/22/2009   BUNDLE BRANCH BLOCK, RIGHT 03/22/2009   GERD 03/22/2009   BPH (benign prostatic hyperplasia) 03/22/2009   Personal history of other specified diseases(V13.89) 03/22/2009   Personal history of colonic polyps 01/22/2000    Past Medical History:  Diagnosis Date   Allergy    Arthritis    BPH (benign prostatic hyperplasia)    CAD (coronary artery disease)    s/p CABG 2007; NSTEMI in setting of AFib with RVR 12/2009; cath 1/11: S-RCA ok with prox 30-40% and 50% stenoses; S-OM and RI  with patent OM limb but an occluded RI limb; S-D2 and L-LAD ok; EF 55%   Cataract    removed both eyes    Clotting disorder (HCC)    s/p CABG x 5- PE    Colon cancer ([place]) 1992   COPD (chronic obstructive pulmonary disease) ([place])    Diverticulosis    Essential hypertension    Family history of breast cancer    Family history of colon cancer    Family history of lung cancer    GERD (gastroesophageal reflux disease)    History of pulmonary embolus ([place]) 2007   Following CABG   Hyperlipidemia    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HCC)    Right  upper   OSA on CPAP    Paroxysmal atrial fibrillation (HCC)    Previously on Multaq, declines anticoagulation   Personal history of colonic polyps 01/22/2000   Tubular adenoma   RBBB (right bundle branch block)    Sleep apnea    no cpap    Squamous cell carcinoma of skin 02/11/2008   [place]-Left paraspinal, lower    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   in situ-left forearm    Family History  Problem Relation Age of Onset   Lung cancer Mother    Heart disease Mother    Emphysema Father    Heart disease Father    Cancer Sister 38       unknown type   Breast cancer Sister        dx. >50   Colon cancer Sister        dx. >50   Breast cancer Sister  dx. >50   Colon cancer Sister        dx. >50   Colon cancer Cousin        dx. early 56s   Esophageal cancer Neg Hx    Rectal cancer Neg Hx    Stomach cancer Neg Hx    Colon polyps Neg Hx    Past Surgical History:  Procedure Laterality Date   CARDIOVERSION N/A 05/23/2021   Procedure: CARDIOVERSION;  Surgeon: Nigel Mormon, MD;  Location: [facility];  Service: Cardiovascular;  Laterality: N/A;   COLECTOMY  1992   w/ colostomy  related to colon cancer   [place]   to take down Colostomy   COLONOSCOPY     CORONARY ARTERY BYPASS GRAFT  2007   CORONARY STENT INTERVENTION N/A 10/07/2018   Procedure: CORONARY STENT INTERVENTION;  Surgeon: Adrian Prows, MD;  Location: [facility];  Service: Cardiovascular;  Laterality: N/A;   HAND [place]   tendon repair and nerve; left   INGUINAL HERNIA REPAIR  2009   LAPAROSCOPIC CHOLECYSTECTOMY  2004   PATENT FORAMEN OVALE(PFO) CLOSURE N/A 06/13/2021   Procedure: PATENT FORAMEN OVALE (PFO) CLOSURE;  Surgeon: Adrian Prows, MD;  Location: [facility];  Service: Cardiovascular;  Laterality: N/A;   POLYPECTOMY     RIGHT HEART CATH N/A 06/13/2021   Procedure: RIGHT HEART CATH;  Surgeon: Adrian Prows, MD;  Location: [facility];   Service: Cardiovascular;  Laterality: N/A;   RIGHT/LEFT HEART CATH AND CORONARY/GRAFT ANGIOGRAPHY N/A 07/03/2017   Procedure: Right/Left Heart Cath and Coronary/Graft Angiography;  Surgeon: Leonie Man, MD;  Location: [facility];  Service: Cardiovascular;  Laterality: N/A;   RIGHT/LEFT HEART CATH AND CORONARY/GRAFT ANGIOGRAPHY N/A 10/07/2018   Procedure: RIGHT/LEFT HEART CATH AND CORONARY/GRAFT ANGIOGRAPHY;  Surgeon: Adrian Prows, MD;  Location: [facility];  Service: Cardiovascular;  Laterality: N/A;   TEE WITHOUT CARDIOVERSION  05/23/2021   Procedure: TRANSESOPHAGEAL ECHOCARDIOGRAM (TEE);  Surgeon: Nigel Mormon, MD;  Location: [facility];  Service: Cardiovascular;;   UMBILICAL HERNIA [place]  2009   Social History   Social History Narrative   Daily caffeine    Immunization History  Administered Date(s) Administered   Influenza Split 10/25/1997, 11/10/2001, 09/08/2008, 06/25/2012, 09/22/2013, 10/21/2018, 11/02/2021   Influenza Whole 10/17/2009   Influenza, High Dose Seasonal PF 09/29/2013, 11/16/2015, 10/21/2018, 10/06/2019, 09/30/2020   Influenza,inj,Quad PF,6+ Mos 11/24/2014   Influenza-Unspecified 11/05/2012   Moderna Covid-19 Vaccine Bivalent Booster 72yrs & up 11/02/2021   Moderna Sars-Covid-2 Vaccination 12/29/2019, 01/29/2020, 01/24/2021   Pneumococcal Conjugate-13 05/17/2014   Pneumococcal Polysaccharide-23 10/17/2005, 11/21/2005   Td 05/17/2014   Zoster Recombinat (Shingrix) 05/14/2018, 08/08/2018   Zoster, Live 05/14/2018     Objective: Vital Signs: BP 117/62 (BP Location: Left Arm, Patient Position: Sitting, Cuff Size: Normal)    Pulse 72    Resp 16    Ht $R'5\' 11"'yU$  (1.803 m)    Wt 242 lb (109.8 kg)    BMI 33.75 kg/m    Physical Exam Constitutional:      Appearance: He is obese.  Cardiovascular:     Rate and Rhythm: Normal rate and regular rhythm.  Pulmonary:     Effort: Pulmonary effort is normal.     Breath sounds:  Normal breath sounds.  Neurological:     Mental Status: He is alert.  Psychiatric:        Mood  and Affect: Mood normal.     Musculoskeletal Exam:  Neck full ROM no tenderness Shoulders full ROM no tenderness or swelling Elbows full ROM no tenderness or swelling Left wrist swelling on dorsla side more on radial side, tenderness to pressure on ulnar side, pain at ulnar side with with flexion and extension movement Left hand fingers mildly contracted, no tenderness or swelling palpable   Investigation: No additional findings.  Imaging: LONG TERM MONITOR (3-14 DAYS)  Result Date: 01/18/2022 Ambulatory cardiac telemetry 3 days (12/28/2021 - 01/01/2020): Predominant underlying rhythm was sinus with first-degree AV block and bundle branch block.  Patient with single asymptomatic episode of VT lasting 4 beats.  6 episodes of SVT with the longest lasting 15 beats, which were asymptomatic.  Episodes of Mobitz type I AV block, asymptomatic and during sleep hours.  Rare PACs with frequent PVCs, both ventricular bigeminy and trigeminy were present.  There were no patient triggered events.  No evidence of pauses >3 seconds, high degree AV block, or atrial fibrillation.   Recent Labs: Lab Results  Component Value Date   WBC 6.0 06/08/2021   HGB 13.9 06/13/2021   PLT 209 06/08/2021   NA 140 10/11/2021   K 4.1 10/11/2021   CL 102 10/11/2021   CO2 22 10/11/2021   GLUCOSE 113 (H) 10/11/2021   BUN 12 10/11/2021   CREATININE 1.32 (H) 10/11/2021   BILITOT 0.5 03/25/2021   ALKPHOS 54 03/25/2021   AST 28 03/25/2021   ALT 34 03/25/2021   PROT 7.2 03/25/2021   ALBUMIN 3.7 03/25/2021   CALCIUM 9.2 10/11/2021   GFRAA >60 10/08/2018    Speciality Comments: No specialty comments available.  Procedures:  No procedures performed Allergies: Morphine sulfate, Rosuvastatin calcium, and Tramadol   Assessment / Plan:     Visit Diagnoses: Pain in left wrist Joint swelling  Persistent pain up to 6-8  severity intermittently with some swelling and x-ray was unremarkable for underlying causes.  This does not appear typical for gouty arthritis he has negative serology for rheumatoid arthritis and I cannot confirm synovitis on exam or ultrasound today.  He is not able to take nonsteroidal anti-inflammatory drugs safely on long-term anticoagulation and had cardiac arrhythmia with previous corticosteroid treatment.  With nonspecific lab findings and x-ray findings and lack of response or intolerance to medications I think more advanced imaging is warranted to determine a cause, will recommend for MRI of the left wrist.    Orders: No orders of the defined types were placed in this encounter.  No orders of the defined types were placed in this encounter.    Follow-Up Instructions: No follow-ups on file.   Collier Salina, MD  Note - This record has been created using [employer].  Chart creation errors have been sought, but may not always  have been located. Such creation errors do not reflect on  the standard of medical care.

## 2022-01-23 NOTE — Patient Instructions (Addendum)
Please continue your Stiolto 2 puffs once daily as you have been taking it. Keep your albuterol available to use either 1 nebulizer treatment or 2 puffs if needed for shortness of breath, chest tightness, wheezing. We will perform a walking oximetry today to ensure that your oxygen level is not dropping with exertion Agree with follow-up with cardiology as planned You would benefit from restarting your CPAP every night. Agree that you would benefit from exercise and slow steady weight loss. Follow with Dr Lamonte Sakai in 3 months or sooner if you have any problems.

## 2022-01-23 NOTE — Addendum Note (Signed)
Addended by: Dierdre Highman on: 01/23/2022 04:33 PM   Modules accepted: Orders

## 2022-01-23 NOTE — Progress Notes (Signed)
Subjective:    Patient ID: [name], male    DOB: [birth year], 81 y.o.   MRN: [id number]  Shortness of Breath Pertinent negatives include no congestion, coughing, fatigue, fever, headaches, joint swelling, myalgias, nausea, rash, sore throat or vomiting.    ROV 04/19/21 --81 year old man with mild COPD, OSA, allergic rhinitis.  He had COVID-19 pneumonia in 08/2020.  Other past medical history significant for atrial fibrillation, CAD. Has been managed on Stiolto.  Has albuterol and uses both HFA and nebs. HFA 2x a day, nebs about 3x a week.  I treated him with prednisone and doxycycline 03/20/2021 when he called concerned about increased dyspnea, wheeze, productive cough of clear sputum.  He started these but then was admitted 03/25/2021 with atrial fibrillation.  His metoprolol was increased and he will follow with Dr. Einar Gip.  He is interested in getting back on a CPAP  ROV 07/19/21 --[name] is an 81 year old gentleman with a history of mild COPD and OSA.  Also allergic rhinitis.  He had COVID-19 pneumonia in September 2021.  PMH: Ascending aortic aneurysm, colon cancer, GERD, atrial fibrillation, CAD, had to be admitted in April 2022 with poorly controlled A. fib.  He was cardioverted 05/23/2021, then underwent atrial septal defect repair 06/13/2021.  He is on antibiotic prophylaxis, plan for 6 months postrepair Currently managed on Stiolto, Mucinex 600 mg as needed, Flonase.  Uses albuterol HFA approximately 1-2x a day, helps some but not a lot. Uses albuterol nebs 2x a week, helps him w congestion. Clears some thick white.  Reports that his SOB did improve some, energy level did improve some after the shunt repair. He still has some SOB that can bother him at rest or w exertion. Better w several deep breaths. No cough or wheeze.  He was able to get back on his CPAP, using about 4h a night. Nasal mask. Having a lot of dry mouth with it.    ROV 01/23/22 --follow-up visit for 81 year old gentleman with a  history of mild COPD, OSA, allergic rhinitis.  He has been currently managed on Stiolto, Mucinex if needed, Flonase.  He uses albuterol He has noticed a worsening in his SOB since last time. Happens at rest or w exertion. He has to stop when doing housework. SOB when bending over. He has cough some episodes of bradycardia w A fib. No change in his wt. He has noticed some wheeze in the evening when he is sitting still. Uses albuterol 1-2x a day, may help him some.  CPAP compliance is poor - he never got back on it reliably.    Review of Systems  Constitutional:  Negative for fatigue, fever and unexpected weight change.  HENT:  Negative for congestion, dental problem, ear pain, nosebleeds, postnasal drip, rhinorrhea, sinus pressure, sneezing, sore throat and trouble swallowing.   Eyes:  Negative for redness and itching.  Respiratory:  Positive for shortness of breath. Negative for cough, chest tightness and wheezing.   Cardiovascular:  Negative for palpitations and leg swelling.  Gastrointestinal:  Negative for nausea and vomiting.  Genitourinary:  Negative for dysuria.  Musculoskeletal:  Negative for joint swelling and myalgias.  Skin:  Negative for rash.  Neurological:  Negative for headaches.  Hematological:  Does not bruise/bleed easily.  Psychiatric/Behavioral:  Negative for dysphoric mood. The patient is not nervous/anxious.       Objective:   Physical Exam Vitals:   01/23/22 1531  BP: 140/68  Pulse: 71  Temp: 98 F (36.7 C)  TempSrc: Oral  SpO2: 95%  Weight: 241 lb 12.8 oz (109.7 kg)  Height: 5\' 11"  (1.803 m)   Gen: Pleasant, obese, in no distress,  normal affect  ENT: No lesions,  mouth clear,  oropharynx clear, no postnasal drip  Neck: No JVD, no stridor  Lungs: No use of accessory muscles, no wheeze, decreased at bases  Cardiovascular:  regular, no M. Trace ankle edema.   Musculoskeletal: amputation R index finger, no cyanosis or clubbing  Neuro: alert, non  focal  Skin: Warm, no lesions or rashes     Assessment & Plan:  COPD (chronic obstructive pulmonary disease) (HCC) Currently on Stiolto, uses albuterol as needed, does seem to benefit.  Plan to continue this bronchodilator regimen.  Unclear whether his progressive dyspnea relates to worsening of his COPD.  He does not seem to have increased bronchospasm or bronchitic symptoms.  Obstructive sleep apnea Has been off of his CPAP for over a year.  Clearly this is likely making management of his lung disease, heart disease more difficult.  May be contributing some to secondary PAH and hypoxemia.  Discussed with him the importance of restarting it if he is able to do so.  Dyspnea on exertion Worsening dyspnea.  Suspect multifactorial.  He has caught some episodes of bradycardia when he is checking his saturations.  Currently being evaluated with an event monitor by cardiology.  Suspect that his underlying lung disease, deconditioning, probably some involving secondary PAH (off of his CPAP) are all contributing.  Discussed with him today.     Baltazar Apo, MD, PhD 01/23/2022, 3:50 PM [facility] [phone] or if no answer [phone]

## 2022-01-23 NOTE — Assessment & Plan Note (Signed)
Has been off of his CPAP for over a year.  Clearly this is likely making management of his lung disease, heart disease more difficult.  May be contributing some to secondary PAH and hypoxemia.  Discussed with him the importance of restarting it if he is able to do so.

## 2022-01-23 NOTE — Progress Notes (Signed)
Primary Physician:  Shirline Frees, MD   Patient ID: [name], male    DOB: December 09, 1941, 81 y.o.   MRN: [id number]  Subjective:    Chief Complaint  Patient presents with   CARDIAC TESTING RESULTS    HPI: [name]  is a 81 y.o. male  with known coronary artery disease and CABG in 2007 in the setting of non-STEMI in atrial fibrillation with RVR. He has a history of  45 pack year h/o smoking tobacco, ascending aortic aneurysm (follows Dr.Bryan Bartle), small abdominal aortic aneurysm noted in 2015 (3cm), H/O colon cancer, in remission since 1992, GERD, hyperlipidemia, obstructive sleep apnea now on CPAP.   Patient hospitalized 03/2021 with recurrence of atrial flutter and echocardiogram revealed new onset LVEF 35-40% with global hypokinesis. Patient underwent successful cardioversion 05/23/2021 with return to normal sinus rhythm.  TEE prior to cardioversion noted ASD, which was subsequently repaired by Dr. Einar Gip 06/13/2021.  Patient presented to our office 12/28/2021 with concerns of bradycardia.  However given patient's underlying PVCs and history of atrial fibrillation obtained 2-week cardiac monitor which revealed a single asymptomatic episode of VT lasting 4 beats, episodes of SVT as well as intermittent Mobitz type I block which was asymptomatic and during sleep hours.  Monitor did reveal frequent PVCs, there were no patient triggered events.  No evidence of atrial fibrillation on the monitor. Patient presents for 4 week follow up to discuss results. Patient reports has had one "spell" since last office visit during which he felt fatigue and heart rate was 48 bpm on home monitoring.   Denies syncope or near syncope.  Denies chest pain, orthopnea, PND, dyspnea.  Past Medical History:  Diagnosis Date   Allergy    Arthritis    BPH (benign prostatic hyperplasia)    CAD (coronary artery disease)    s/p CABG 2007; NSTEMI in setting of AFib with RVR 12/2009; cath 1/11: S-RCA ok with  prox 30-40% and 50% stenoses; S-OM and RI  with patent OM limb but an occluded RI limb; S-D2 and L-LAD ok; EF 55%   Cataract    removed both eyes    Clotting disorder (HCC)    s/p CABG x 5- PE    Colon cancer ([place]) 1992   COPD (chronic obstructive pulmonary disease) ([place])    Diverticulosis    Essential hypertension    Family history of breast cancer    Family history of colon cancer    Family history of lung cancer    GERD (gastroesophageal reflux disease)    History of pulmonary embolus (PE) 2007   Following CABG   Hyperlipidemia    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HCC)    Right upper   OSA on CPAP    Paroxysmal atrial fibrillation (HCC)    Previously on Multaq, declines anticoagulation   Personal history of colonic polyps 01/22/2000   Tubular adenoma   RBBB (right bundle branch block)    Sleep apnea    no cpap    Squamous cell carcinoma of skin 02/11/2008   [place]-Left paraspinal, lower    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   in situ-left forearm   Past Surgical History:  Procedure Laterality Date   CARDIOVERSION N/A 05/23/2021   Procedure: CARDIOVERSION;  Surgeon: Nigel Mormon, MD;  Location: [facility];  Service: Cardiovascular;  Laterality: N/A;   COLECTOMY  1992   w/ colostomy  related to colon cancer   [place]   to take down Colostomy  COLONOSCOPY     CORONARY ARTERY BYPASS GRAFT  2007   CORONARY STENT INTERVENTION N/A 10/07/2018   Procedure: CORONARY STENT INTERVENTION;  Surgeon: Adrian Prows, MD;  Location: [place] [facility];  Service: Cardiovascular;  Laterality: N/A;   HAND [place]   tendon repair and nerve; left   INGUINAL HERNIA REPAIR  2009   LAPAROSCOPIC CHOLECYSTECTOMY  2004   PATENT FORAMEN OVALE(PFO) CLOSURE N/A 06/13/2021   Procedure: PATENT FORAMEN OVALE (PFO) CLOSURE;  Surgeon: Adrian Prows, MD;  Location: [place] [facility];  Service: Cardiovascular;  Laterality: N/A;   POLYPECTOMY     RIGHT HEART CATH N/A  06/13/2021   Procedure: RIGHT HEART CATH;  Surgeon: Adrian Prows, MD;  Location: [place] [facility];  Service: Cardiovascular;  Laterality: N/A;   RIGHT/LEFT HEART CATH AND CORONARY/GRAFT ANGIOGRAPHY N/A 07/03/2017   Procedure: Right/Left Heart Cath and Coronary/Graft Angiography;  Surgeon: Leonie Man, MD;  Location: [place] [facility];  Service: Cardiovascular;  Laterality: N/A;   RIGHT/LEFT HEART CATH AND CORONARY/GRAFT ANGIOGRAPHY N/A 10/07/2018   Procedure: RIGHT/LEFT HEART CATH AND CORONARY/GRAFT ANGIOGRAPHY;  Surgeon: Adrian Prows, MD;  Location: [facility];  Service: Cardiovascular;  Laterality: N/A;   TEE WITHOUT CARDIOVERSION  05/23/2021   Procedure: TRANSESOPHAGEAL ECHOCARDIOGRAM (TEE);  Surgeon: Nigel Mormon, MD;  Location: [facility]OS[facility];  Service: Cardiovascular;;   UMBILICAL HERNIA REPAIR     VENTRAL HERNIA REPAIR  2009   Family History  Problem Relation Age of Onset   Lung cancer Mother    Heart disease Mother    Emphysema Father    Heart disease Father    Cancer Sister 19       unknown type   Breast cancer Sister        dx. >50   Colon cancer Sister        dx. >50   Breast cancer Sister        dx. >50   Colon cancer Sister        dx. >50   Colon cancer Cousin        dx. early 8s   Esophageal cancer Neg Hx    Rectal cancer Neg Hx    Stomach cancer Neg Hx    Colon polyps Neg Hx    Social History   Tobacco Use   Smoking status: Former    Packs/day: 1.00    Years: 45.00    Pack years: 45.00    Types: Cigarettes    Start date: 1955    Quit date: 12/18/1995    Years since quitting: 26.1   Smokeless tobacco: Never  Substance Use Topics   Alcohol use: No    Comment: quit in 1980   Marital Status: Married  ROS   Review of Systems  Constitutional: Positive for malaise/fatigue.  Cardiovascular:  Positive for leg swelling (intermittent). Negative for chest pain, claudication, near-syncope, orthopnea, palpitations (improved), paroxysmal nocturnal  dyspnea and syncope.  Respiratory:  Negative for shortness of breath.    Objective:  Blood pressure 114/66, pulse 81, temperature 98.5 F (36.9 C), temperature source Temporal, resp. rate 17, height 5\' 11"  (1.803 m), weight 243 lb 9.6 oz (110.5 kg), SpO2 95 %. Body mass index is 33.98 kg/m.  Vitals with BMI 01/25/2022 01/24/2022 01/23/2022  Height 5\' 11"  5\' 11"  5\' 11"   Weight 243 lbs 10 oz 242 lbs 241 lbs 13 oz  BMI 33.99 95.62 13.08  Systolic 657 846 962  Diastolic 66  62 68  Pulse 81 72 71   Physical Exam Vitals reviewed.  Constitutional:      Appearance: He is well-developed.  Neck:     Vascular: No carotid bruit.  Cardiovascular:     Rate and Rhythm: Normal rate and regular rhythm. Occasional Extrasystoles are present.    Pulses: Intact distal pulses.          Femoral pulses are 2+ on the right side and 2+ on the left side.      Popliteal pulses are 1+ on the right side and 1+ on the left side.       Dorsalis pedis pulses are 1+ on the right side and 1+ on the left side.       Posterior tibial pulses are 2+ on the right side and 2+ on the left side.     Heart sounds: Normal heart sounds, S1 normal and S2 normal. No murmur heard.   No gallop.  Pulmonary:     Effort: Pulmonary effort is normal. No accessory muscle usage.     Breath sounds: Normal breath sounds.  Musculoskeletal:     Right lower leg: No edema.     Left lower leg: No edema.   Laboratory examination:   CMP Latest Ref Rng & Units 10/11/2021 06/13/2021 06/13/2021  Glucose 70 - 99 mg/dL 113(H) - -  BUN 8 - 27 mg/dL 12 - -  Creatinine 0.76 - 1.27 mg/dL 1.32(H) - -  Sodium 134 - 144 mmol/L 140 140 141  Potassium 3.5 - 5.2 mmol/L 4.1 3.8 3.8  Chloride 96 - 106 mmol/L 102 - -  CO2 20 - 29 mmol/L 22 - -  Calcium 8.6 - 10.2 mg/dL 9.2 - -  Total Protein 6.5 - 8.1 g/dL - - -  Total Bilirubin 0.3 - 1.2 mg/dL - - -  Alkaline Phos 38 - 126 U/L - - -  AST 15 - 41 U/L - - -  ALT 0 - 44 U/L - - -   CBC Latest Ref Rng &  Units 06/13/2021 06/13/2021 06/13/2021  WBC 3.4 - 10.8 x10E3/uL - - -  Hemoglobin 13.0 - 17.0 g/dL 13.9 13.9 13.6  Hematocrit 39.0 - 52.0 % 41.0 41.0 40.0  Platelets 150 - 450 x10E3/uL - - -   Lipid Panel     Component Value Date/Time   CHOL 134 09/08/2015 1210   TRIG 211.0 (H) 09/08/2015 1210   HDL 36.10 (L) 09/08/2015 1210   CHOLHDL 4 09/08/2015 1210   VLDL 42.2 (H) 09/08/2015 1210   LDLCALC 59 07/23/2014 1638   LDLDIRECT 87.0 09/08/2015 1210   HEMOGLOBIN A1C No results found for: HGBA1C, MPG TSH Recent Labs    03/25/21 1443  TSH 0.630    External Labs: 08/07/2021: HDL 36, LDL 49, total cholesterol 109, triglycerides 134 A1c 6.4% BUN 15, creatinine 1.3, GFR 55 TSH 0.63  Cholesterol, total 128.000 m 06/22/2020 HDL 34.000 mg 06/22/2020 LDL-C 63.000 mg 06/22/2020 Triglycerides 183.000 m 06/22/2020  Hemoglobin 14.900 g/d 06/22/2020  Creatinine, Serum 1.250 mg/ 06/22/2020 Potassium 3.900 mm 06/22/2020 ALT (SGPT) 30.000 U/L 06/22/2020  TSH 2.120 06/22/2020   Allergies   Allergies  Allergen Reactions   Morphine Sulfate Itching   Rosuvastatin Calcium Other (See Comments)    Muscle weakness   Tramadol Itching    Other reaction(s): Unknown    Medications Prior to Visit:   Outpatient Medications Prior to Visit  Medication Sig Dispense Refill   albuterol (PROVENTIL) (2.5 MG/3ML) 0.083% nebulizer solution USE  1 VIAL IN NEBULIZER EVERY 6 HOURS AS NEEDED FOR WHEEZING OR SHORTNESS OF BREATH 75 mL 3   albuterol (VENTOLIN HFA) 108 (90 Base) MCG/ACT inhaler INHALE 2 PUFFS BY MOUTH EVERY 6 HOURS AS NEEDED FOR WHEEZING OR SHORTNESS OF BREATH 9 g 1   atorvastatin (LIPITOR) 20 MG tablet Take 20 mg by mouth at bedtime.   5   ciclopirox (PENLAC) 8 % solution Apply topically at bedtime. Apply over nail and surrounding skin. Apply daily over previous coat. After seven (7) days, may remove with alcohol and continue cycle. 6.6 mL 2   clonazePAM (KLONOPIN) 1 MG tablet Take 1 mg by mouth at bedtime as  needed (sleep).     ELIQUIS 5 MG TABS tablet Take 1 tablet by mouth twice daily 180 tablet 1   fluticasone (FLONASE) 50 MCG/ACT nasal spray Place 2 sprays into both nostrils daily. 16 g 2   gabapentin (NEURONTIN) 100 MG capsule Take 100-200 mg by mouth See admin instructions. Take 200 mg am & 100 mg at bedtime     guaiFENesin (MUCINEX) 600 MG 12 hr tablet Take 600 mg by mouth daily as needed (bronchitis).     HYDROcodone-acetaminophen (NORCO/VICODIN) 5-325 MG tablet Take 1 tablet by mouth every 6 (six) hours as needed for moderate pain.     isosorbide mononitrate (IMDUR) 30 MG 24 hr tablet Take 1 tablet by mouth once daily 90 tablet 1   Magnesium 200 MG TABS Take 200 mg by mouth in the morning.     Multiple Vitamin (MULTIVITAMIN WITH MINERALS) TABS tablet Take 1 tablet by mouth in the morning.     nitroGLYCERIN (NITROSTAT) 0.4 MG SL tablet Place 1 tablet (0.4 mg total) under the tongue every 5 (five) minutes as needed for chest pain. 25 tablet 3   Omega-3 Fatty Acids (FISH OIL) 1000 MG CAPS Take 1,000 mg by mouth in the morning.     omeprazole (PRILOSEC) 20 MG capsule Take 1 capsule (20 mg total) by mouth daily. 30 capsule 3   Phenylephrine-DM-GG (ROBITUSSIN COUGH/COLD CF MAX PO) Take 20 mLs by mouth every 6 (six) hours as needed (cough).     Potassium Chloride ER 20 MEQ TBCR Take 20 mEq by mouth in the morning.     STIOLTO RESPIMAT 2.5-2.5 MCG/ACT AERS INHALE 2 PUFFS BY MOUTH ONCE DAILY 4 g 5   tamsulosin (FLOMAX) 0.4 MG CAPS capsule Take 0.8 mg by mouth at bedtime.     tiZANidine (ZANAFLEX) 4 MG tablet Take 4 mg by mouth at bedtime as needed for muscle spasms.     torsemide (DEMADEX) 20 MG tablet Take 40 mg by mouth in the morning.     metoprolol tartrate (LOPRESSOR) 50 MG tablet Take 1 tablet (50 mg total) by mouth 2 (two) times daily. 180 tablet 3   No facility-administered medications prior to visit.   Final Medications at End of Visit    Current Meds  Medication Sig   albuterol  (PROVENTIL) (2.5 MG/3ML) 0.083% nebulizer solution USE 1 VIAL IN NEBULIZER EVERY 6 HOURS AS NEEDED FOR WHEEZING OR SHORTNESS OF BREATH   albuterol (VENTOLIN HFA) 108 (90 Base) MCG/ACT inhaler INHALE 2 PUFFS BY MOUTH EVERY 6 HOURS AS NEEDED FOR WHEEZING OR SHORTNESS OF BREATH   atorvastatin (LIPITOR) 20 MG tablet Take 20 mg by mouth at bedtime.    ciclopirox (PENLAC) 8 % solution Apply topically at bedtime. Apply over nail and surrounding skin. Apply daily over previous coat. After seven (7) days, may  remove with alcohol and continue cycle.   clonazePAM (KLONOPIN) 1 MG tablet Take 1 mg by mouth at bedtime as needed (sleep).   ELIQUIS 5 MG TABS tablet Take 1 tablet by mouth twice daily   fluticasone (FLONASE) 50 MCG/ACT nasal spray Place 2 sprays into both nostrils daily.   gabapentin (NEURONTIN) 100 MG capsule Take 100-200 mg by mouth See admin instructions. Take 200 mg am & 100 mg at bedtime   guaiFENesin (MUCINEX) 600 MG 12 hr tablet Take 600 mg by mouth daily as needed (bronchitis).   HYDROcodone-acetaminophen (NORCO/VICODIN) 5-325 MG tablet Take 1 tablet by mouth every 6 (six) hours as needed for moderate pain.   isosorbide mononitrate (IMDUR) 30 MG 24 hr tablet Take 1 tablet by mouth once daily   Magnesium 200 MG TABS Take 200 mg by mouth in the morning.   Multiple Vitamin (MULTIVITAMIN WITH MINERALS) TABS tablet Take 1 tablet by mouth in the morning.   nitroGLYCERIN (NITROSTAT) 0.4 MG SL tablet Place 1 tablet (0.4 mg total) under the tongue every 5 (five) minutes as needed for chest pain.   Omega-3 Fatty Acids (FISH OIL) 1000 MG CAPS Take 1,000 mg by mouth in the morning.   omeprazole (PRILOSEC) 20 MG capsule Take 1 capsule (20 mg total) by mouth daily.   Phenylephrine-DM-GG (ROBITUSSIN COUGH/COLD CF MAX PO) Take 20 mLs by mouth every 6 (six) hours as needed (cough).   Potassium Chloride ER 20 MEQ TBCR Take 20 mEq by mouth in the morning.   STIOLTO RESPIMAT 2.5-2.5 MCG/ACT AERS INHALE 2 PUFFS  BY MOUTH ONCE DAILY   tamsulosin (FLOMAX) 0.4 MG CAPS capsule Take 0.8 mg by mouth at bedtime.   tiZANidine (ZANAFLEX) 4 MG tablet Take 4 mg by mouth at bedtime as needed for muscle spasms.   torsemide (DEMADEX) 20 MG tablet Take 40 mg by mouth in the morning.   [DISCONTINUED] metoprolol tartrate (LOPRESSOR) 50 MG tablet Take 1 tablet (50 mg total) by mouth 2 (two) times daily.   Radiology    Chest x-ray 03/25/2021: 1. Low volume film with cardiomegaly and vascular congestion. 2. Patchy airspace disease in the left base compatible with atelectasis or pneumonia. 3. Tiny nodular density peripheral right mid lung is stable, suggesting benign etiology.  CTA of chest 09/16/2019:  4.4 cm ascending thoracic aortic aneurysm. Recommend annual imaging followup by CTA or MRA. This recommendation follows 2010 ACCF/AHA/AATS/ACR/ASA/SCA/SCAI/SIR/STS/SVM Guidelines for the Diagnosis and Management of Patients with Thoracic Aortic Disease. Circulation. 2010; 121: Y073-X106. Aortic aneurysm NOS (ICD10-I71.9)   Old granulomatous disease. No acute cardiopulmonary disease. Aortic Atherosclerosis (ICD10-I70.0).  Cardiac Studies:   CABG 2007: LIMA to LAD, SVG to D2, SVG to OM1 and ramus intermediate with OM1 limb occluded, SVG to RCA with a proximal 50% stenosis by coronary angiography in July 2018. Normal LVEF.  Lexiscan sestamibi stress test 12/03/2016: No significant ST-T wave changes during stress test. Medium defect of moderate intensity in the basal inferior, mid inferoseptal, mid inferior and mid inferolateral and apical inferior and apical lateral location consistent with Myocardial infarction mild degree of peri-infarct ischemia. LVEF 52%. Intermediate risk study.  Coronary angiogram 10/08/2018 and had a very high-grade stenosis of a large hiatal D1/ramus branch S/P 3.0 x 23 mm Sierra Xience DES. Patent LIMA to LAD, SVG to D2 and SVG to RCA widely patent. Normal right heart catheterization, preserved  cardiac output and cardiac index.  Echocardiogram 03/26/2021: 1. Left ventricular ejection fraction, by estimation, is 35 to 40%. The  left  ventricle has moderately decreased function. The left ventricle  demonstrates global hypokinesis. There is mild left ventricular  hypertrophy. Left ventricular diastolic  parameters are indeterminate.   2. Right ventricular systolic function is normal. The right ventricular  size is normal.   3. Left atrial size was mildly dilated.   4. The mitral valve is normal in structure. No evidence of mitral valve  regurgitation. No evidence of mitral stenosis.   5. The aortic valve is tricuspid. There is mild calcification of the  aortic valve. Aortic valve regurgitation is trivial. Mild aortic valve  sclerosis is present, with no evidence of aortic valve stenosis.   6. The inferior vena cava is normal in size with greater than 50%  respiratory variability, suggesting right atrial pressure of 3 mmHg.   TEE 05/23/2021:  1. Left ventricular ejection fraction, by estimation, is 35 to 40%. The left ventricle has moderately decreased function. The left ventricle demonstrates global hypokinesis.   2. Right ventricular systolic function is moderately reduced. The right ventricular size is moderately enlarged.   3. Left atrial size was severely dilated. No left atrial/left atrial appendage thrombus was detected.   4. Right atrial size was moderately dilated.   5. The mitral valve is grossly normal. Mild mitral valve regurgitation.   6. The aortic valve is tricuspid. Aortic valve regurgitation is not visualized.   7. Evidence of atrial level shunting detected by color flow Doppler. Septum primum, as well as septum secundum defects seen. 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 Multiple ASD's are noted.   8. No prior TEE for comparison.   Ambulatory cardiac telemetry 3 days (12/28/2021 - 01/01/2020): Predominant  underlying rhythm was sinus with first-degree AV block and bundle branch block.  Patient with single asymptomatic episode of VT lasting 4 beats.  6 episodes of SVT with the longest lasting 15 beats, which were asymptomatic.  Episodes of Mobitz type I AV block, asymptomatic and during sleep hours.  Rare PACs with frequent PVCs, both ventricular bigeminy and trigeminy were present.  There were no patient triggered events.  No evidence of pauses >3 seconds, high degree AV block, or atrial fibrillation. PVC burden 5.7%  EKG:  12/28/2021: Sinus rhythm with first-degree AV block and PVCs with sinus arrhythmia and a rate of 62 bpm.  Normal axis.  Right bundle branch block.  09/27/2021: Sinus rhythm with first-degree AV block at a rate of 75 bpm.  Normal axis.  Right bundle branch block.  Compared to EKG 06/27/2021, no significant change.  05/03/2021: Atrial fibrillation with rapid ventricular response at a rate of 103 bpm.  Normal axis.  Right bundle branch block, secondary ST-T wave changes.  Compared to EKG 03/30/2021, ventricular rate controlled improved otherwise no significant change.  ED EKG 03/25/2021: Atrial flutter with 4-1 AV block at a rate of 128 bpm.  10/12/2020: Sinus rhythm with  first-degree AV block at rate of 82 bpm, normal axis, right bundle branch block.   Assessment:     ICD-10-CM   1. Palpitations  R00.2     2. PVC (premature ventricular contraction)  I49.3     3. Paroxysmal atrial fibrillation (HCC)  I48.0       Meds ordered this encounter  Medications   metoprolol tartrate (LOPRESSOR) 25 MG tablet    Sig: Take 1 tablet (25 mg total) by mouth 2 (two) times daily.    Dispense:  180 tablet    Refill:  3    Medications Discontinued During This Encounter  Medication Reason   metoprolol tartrate (LOPRESSOR) 50 MG tablet Reorder    This patients CHA2DS2-VASc Score 5 (CHF, HTN, vasc, age) and yearly risk of stroke 7.2%.   Recommendations:   [name]  is a 81 y.o.  with known coronary artery disease and CABG in 2007 in the setting of non-STEMI in atrial fibrillation with RVR. He has a history of  45 pack year h/o smoking tobacco, ascending aortic aneurysm (follows Dr.Bryan Bartle), small abdominal aortic aneurysm noted in 2015 (3cm), H/O colon cancer, in remission since 1992, GERD, hyperlipidemia, obstructive sleep apnea now on CPAP.   Patient hospitalized 03/2021 with recurrence of atrial flutter and echocardiogram revealed new onset LVEF 35-40% with global hypokinesis. Patient underwent successful cardioversion 05/23/2021 with return to normal sinus rhythm.  TEE prior to cardioversion noted ASD, which was subsequently repaired by Dr. Einar Gip 06/13/2021.  Patient presented to our office 12/28/2021 with concerns of bradycardia.  However given patient's underlying PVCs and history of atrial fibrillation obtained 2-week cardiac monitor which revealed a single asymptomatic episode of VT lasting 4 beats, episodes of SVT as well as intermittent Mobitz type I block which was asymptomatic and during sleep hours.  Monitor did reveal frequent PVCs, there were no patient triggered events.  No evidence of atrial fibrillation on the monitor. Patient presents for 4 week follow up to discuss results. Patient has sleep apnea but does not use a CPAP as recommended, which is contributing to episodes of Mobitz block during sleep hours. Upon questioning further questions it appears patient is indeed asymptomatic in regard to PVCs, SVT, and VT. Patient had no recurrence of atrial fibrillation/flutter on cardiac monitor.  Given soft blood pressure discussed with patient management options given underlying PVCs and SVT as well as soft blood pressure.  Advised patient that episodes of PVCs and SVT may increase if we reduce beta-blocker therapy, however he opted to try reducing Lopressor from 50 mg to 25 mg p.o. twice daily to see if patient's symptoms improve allowing blood pressure to increase some.   Patient will monitor for symptoms closely.  Advised patient and his wife regarding signs symptoms that would warrant urgent or emergent evaluation, they verbalized understanding agreement.  Patient is scheduled for repeat echocardiogram and follow-up in 2 months, will keep these appointments.  Pending results of echocardiogram if LVEF remains low would recommend being more aggressive with uptitration of guideline directed medical therapy.  Follow-up in 2 months, sooner if needed.    Samuel Berthold, PA-C 01/26/2022, 11:03 AM Office: [phone]

## 2022-01-23 NOTE — Assessment & Plan Note (Signed)
Currently on Stiolto, uses albuterol as needed, does seem to benefit.  Plan to continue this bronchodilator regimen.  Unclear whether his progressive dyspnea relates to worsening of his COPD.  He does not seem to have increased bronchospasm or bronchitic symptoms.

## 2022-01-23 NOTE — Assessment & Plan Note (Signed)
Worsening dyspnea.  Suspect multifactorial.  He has caught some episodes of bradycardia when he is checking his saturations.  Currently being evaluated with an event monitor by cardiology.  Suspect that his underlying lung disease, deconditioning, probably some involving secondary PAH (off of his CPAP) are all contributing.  Discussed with him today.

## 2022-01-24 ENCOUNTER — Encounter: Payer: Self-pay | Admitting: Internal Medicine

## 2022-01-24 ENCOUNTER — Ambulatory Visit: Payer: Medicare PPO | Admitting: Internal Medicine

## 2022-01-24 VITALS — BP 117/62 | HR 72 | Resp 16 | Ht 71.0 in | Wt 242.0 lb

## 2022-01-24 DIAGNOSIS — M2540 Effusion, unspecified joint: Secondary | ICD-10-CM

## 2022-01-24 DIAGNOSIS — M25532 Pain in left wrist: Secondary | ICD-10-CM | POA: Diagnosis not present

## 2022-01-24 NOTE — Patient Instructions (Signed)
For osteoarthritis of the hand several treatments may be beneficial: - Topical antiinflammatory medicine such as diclofenac or Voltaren can be applied to  affected area as needed but may be less effective than oral antiinflammatory medicine. Topical analgesics containing CBD, menthol, or lidocaine can be tried.  - Other oral supplements such as glucosamine chondroitin containing OTC treatments do not have strong data supporting effectiveness but can be helpful for some individuals and have no major side effects. Turmeric has some antiinflammatory effect similar to NSAID medications and may help, if taken as a supplement should not be taken above recommended doses.  - Compressive gloves can be helpful to support the thumb joint especially if hurting with certain activities.  - Local steroid injection is an option if symptoms become worse and not controlled by the above options.

## 2022-01-25 ENCOUNTER — Encounter: Payer: Self-pay | Admitting: Student

## 2022-01-25 ENCOUNTER — Other Ambulatory Visit: Payer: Self-pay

## 2022-01-25 ENCOUNTER — Ambulatory Visit: Payer: Medicare PPO | Admitting: Student

## 2022-01-25 VITALS — BP 114/66 | HR 81 | Temp 98.5°F | Resp 17 | Ht 71.0 in | Wt 243.6 lb

## 2022-01-25 DIAGNOSIS — R002 Palpitations: Secondary | ICD-10-CM

## 2022-01-25 DIAGNOSIS — I493 Ventricular premature depolarization: Secondary | ICD-10-CM | POA: Diagnosis not present

## 2022-01-25 DIAGNOSIS — I480 Paroxysmal atrial fibrillation: Secondary | ICD-10-CM

## 2022-01-25 MED ORDER — METOPROLOL TARTRATE 25 MG PO TABS: 25.0000 mg | ORAL_TABLET | Freq: Two times a day (BID) | ORAL | 3 refills | Status: DC

## 2022-01-29 ENCOUNTER — Telehealth: Payer: Self-pay

## 2022-01-29 NOTE — Telephone Encounter (Signed)
Patient called to check the status of MRI for his left wrist.  Patient requested a return call.

## 2022-01-29 NOTE — Telephone Encounter (Signed)
MRI approved, Auth # [id number] 01/30/2022-03/01/2022, patient is aware to call centralized scheduling to schedule appt at [facility]

## 2022-02-08 ENCOUNTER — Other Ambulatory Visit: Payer: Self-pay

## 2022-02-08 ENCOUNTER — Ambulatory Visit (HOSPITAL_COMMUNITY)
Admission: RE | Admit: 2022-02-08 | Discharge: 2022-02-08 | Disposition: A | Discharge status: Home / Self Care | Payer: Medicare PPO | Source: Ambulatory Visit | Attending: Internal Medicine | Admitting: Internal Medicine

## 2022-02-08 DIAGNOSIS — M2540 Effusion, unspecified joint: Secondary | ICD-10-CM | POA: Insufficient documentation

## 2022-02-08 DIAGNOSIS — G8929 Other chronic pain: Secondary | ICD-10-CM | POA: Diagnosis not present

## 2022-02-08 DIAGNOSIS — Z0000 Encounter for general adult medical examination without abnormal findings: Secondary | ICD-10-CM | POA: Diagnosis not present

## 2022-02-08 DIAGNOSIS — M25532 Pain in left wrist: Secondary | ICD-10-CM | POA: Insufficient documentation

## 2022-02-08 DIAGNOSIS — I2510 Atherosclerotic heart disease of native coronary artery without angina pectoris: Secondary | ICD-10-CM | POA: Diagnosis not present

## 2022-02-08 DIAGNOSIS — R7303 Prediabetes: Secondary | ICD-10-CM | POA: Diagnosis not present

## 2022-02-08 DIAGNOSIS — I10 Essential (primary) hypertension: Secondary | ICD-10-CM | POA: Diagnosis not present

## 2022-02-08 DIAGNOSIS — M19032 Primary osteoarthritis, left wrist: Secondary | ICD-10-CM | POA: Diagnosis not present

## 2022-02-08 DIAGNOSIS — M7989 Other specified soft tissue disorders: Secondary | ICD-10-CM | POA: Diagnosis not present

## 2022-02-08 DIAGNOSIS — N1831 Chronic kidney disease, stage 3a: Secondary | ICD-10-CM | POA: Diagnosis not present

## 2022-02-08 DIAGNOSIS — E7800 Pure hypercholesterolemia, unspecified: Secondary | ICD-10-CM | POA: Diagnosis not present

## 2022-02-08 DIAGNOSIS — S63512A Sprain of carpal joint of left wrist, initial encounter: Secondary | ICD-10-CM | POA: Diagnosis not present

## 2022-02-12 NOTE — Progress Notes (Signed)
I spoke with [name] his MRI shows osteoarthritis, possible sprain, and suspicious for fibrocartilage tear at ulnar side. Correlating with his symptoms the swelling at the radial side makes sense but more of the pain is at that ulnar portion of the joint. Combined with negative labs there is no evidence for RA or gout as a cause of this arthritis. I recommend he should follow up with Dr. Caralyn Guile to discuss further options for procedure or continue conservative treatment.

## 2022-02-20 ENCOUNTER — Ambulatory Visit: Payer: Medicare PPO | Admitting: Dermatology

## 2022-02-20 ENCOUNTER — Encounter: Payer: Self-pay | Admitting: Dermatology

## 2022-02-20 ENCOUNTER — Other Ambulatory Visit: Payer: Self-pay

## 2022-02-20 DIAGNOSIS — D1801 Hemangioma of skin and subcutaneous tissue: Secondary | ICD-10-CM | POA: Diagnosis not present

## 2022-02-20 DIAGNOSIS — L814 Other melanin hyperpigmentation: Secondary | ICD-10-CM | POA: Diagnosis not present

## 2022-02-20 DIAGNOSIS — D1800 Hemangioma unspecified site: Secondary | ICD-10-CM

## 2022-02-20 DIAGNOSIS — D229 Melanocytic nevi, unspecified: Secondary | ICD-10-CM

## 2022-02-20 DIAGNOSIS — L570 Actinic keratosis: Secondary | ICD-10-CM

## 2022-02-20 DIAGNOSIS — L821 Other seborrheic keratosis: Secondary | ICD-10-CM

## 2022-02-20 DIAGNOSIS — Z85828 Personal history of other malignant neoplasm of skin: Secondary | ICD-10-CM

## 2022-02-20 DIAGNOSIS — L578 Other skin changes due to chronic exposure to nonionizing radiation: Secondary | ICD-10-CM | POA: Diagnosis not present

## 2022-02-20 DIAGNOSIS — Z1283 Encounter for screening for malignant neoplasm of skin: Secondary | ICD-10-CM

## 2022-02-20 NOTE — Patient Instructions (Addendum)
Cryotherapy Aftercare  Wash gently with soap and water everyday.   Apply Vaseline and Band-Aid daily until healed.   Prior to procedure, discussed risks of blister formation, small wound, skin dyspigmentation, or rare scar following cryotherapy. Recommend Vaseline ointment to treated areas while healing.     Recommend daily broad spectrum sunscreen SPF 30+ to sun-exposed areas, reapply every 2 hours as needed. Call for new or changing lesions.  Staying in the shade or wearing long sleeves, sun glasses (UVA+UVB protection) and wide brim hats (4-inch brim around the entire circumference of the hat) are also recommended for sun protection.    Melanoma ABCDEs  Melanoma is the most dangerous type of skin cancer, and is the leading cause of death from skin disease.  You are more likely to develop melanoma if you: Have light-colored skin, light-colored eyes, or red or blond hair Spend a lot of time in the sun Tan regularly, either outdoors or in a tanning bed Have had blistering sunburns, especially during childhood Have a close family member who has had a melanoma Have atypical moles or large birthmarks  Early detection of melanoma is key since treatment is typically straightforward and cure rates are extremely high if we catch it early.   The first sign of melanoma is often a change in a mole or a new dark spot.  The ABCDE system is a way of remembering the signs of melanoma.  A for asymmetry:  The two halves do not match. B for border:  The edges of the growth are irregular. C for color:  A mixture of colors are present instead of an even brown color. D for diameter:  Melanomas are usually (but not always) greater than 52m - the size of a pencil eraser. E for evolution:  The spot keeps changing in size, shape, and color.  Please check your skin once per month between visits. You can use a small mirror in front and a large mirror behind you to keep an eye on the back side or your body.    If you see any new or changing lesions before your next follow-up, please call to schedule a visit.  Please continue daily skin protection including broad spectrum sunscreen SPF 30+ to sun-exposed areas, reapplying every 2 hours as needed when you're outdoors.   Staying in the shade or wearing long sleeves, sun glasses (UVA+UVB protection) and wide brim hats (4-inch brim around the entire circumference of the hat) are also recommended for sun protection.    If You Need Anything After Your Visit  If you have any questions or concerns for your doctor, please call our main line at [phone] press option 4 to reach your doctor's medical assistant. If no one answers, please leave a voicemail as directed and we will return your call as soon as possible. Messages left after 4 pm will be answered the following business day.   You may also send [place] message via [place] We typically respond to MyChart messages within 1-2 business days.  For prescription refills, please ask your pharmacy to contact our office. Our fax number is [phone]  If you have an urgent issue when the clinic is closed that cannot wait until the next business day, you can page your doctor at the number below.    Please note that while we do our best to be available for urgent issues outside of office hours, we are not available 24/7.   If you have an urgent issue and  are unable to reach [place], you may choose to seek medical care at your doctor's office, retail clinic, urgent care center, or emergency room.  If you have a medical emergency, please immediately call 911 or go to the emergency department.  Pager Numbers  - Dr. Nehemiah Massed: [phone]  - Dr. Laurence Ferrari: [phone]  - Dr. Nicole Kindred: [phone]  In the event of inclement weather, please call our main line at [phone] for an update on the status of any delays or closures.  Dermatology Medication Tips: Please keep the boxes that topical medications  come in in order to help keep track of the instructions about where and how to use these. Pharmacies typically print the medication instructions only on the boxes and not directly on the medication tubes.   If your medication is too expensive, please contact our office at [phone] option 4 or send [place] a message through [place].   We are unable to tell what your co-pay for medications will be in advance as this is different depending on your insurance coverage. However, we may be able to find a substitute medication at lower cost or fill out paperwork to get insurance to cover a needed medication.   If a prior authorization is required to get your medication covered by your insurance company, please allow [place] 1-2 business days to complete this process.  Drug prices often vary depending on where the prescription is filled and some pharmacies may offer cheaper prices.  The website www.goodrx.com contains coupons for medications through different pharmacies. The prices here do not account for what the cost may be with help from insurance (it may be cheaper with your insurance), but the website can give you the price if you did not use any insurance.  - You can print the associated coupon and take it with your prescription to the pharmacy.  - You may also stop by our office during regular business hours and pick up a GoodRx coupon card.  - If you need your prescription sent electronically to a different pharmacy, notify our office through [facility] or by phone at [phone] option 4.     Si Usted Necesita Algo Despus de Su Visita  Tambin puede enviarnos un mensaje a travs de Pharmacist, community. Por lo general respondemos a los mensajes de MyChart en el transcurso de 1 a 2 das hbiles.  Para renovar recetas, por favor pida a su farmacia que se ponga en contacto con nuestra oficina. Harland Dingwall de fax es [place] [phone].  Si tiene un asunto urgente cuando la clnica est cerrada y que no  puede esperar hasta el siguiente da hbil, puede llamar/localizar a su doctor(a) al nmero que aparece a continuacin.   Por favor, tenga en cuenta que aunque hacemos todo lo posible para estar disponibles para asuntos urgentes fuera del horario de [place], no estamos disponibles las 24 horas del da, los 7 das de la [place].   Si tiene un problema urgente y no puede comunicarse con nosotros, puede optar por buscar atencin mdica  en el consultorio de su doctor(a), en una clnica privada, en un centro de atencin urgente o en una sala de emergencias.  Si tiene Engineering geologist, por favor llame inmediatamente al 911 o vaya a la sala de emergencias.  Nmeros de bper  - Dr. Nehemiah Massed: [phone]  - Dra. Moye: [phone]  - Dra. Nicole Kindred: [phone]  En caso de inclemencias del [place], por favor llame a nuestra lnea principal al [phone] para una actualizacin [employer]  estado de cualquier retraso o cierre.  Consejos para la medicacin en dermatologa: Por favor, guarde las cajas en las que vienen los medicamentos de uso tpico para ayudarle a seguir las instrucciones sobre dnde y cmo usarlos. Las farmacias generalmente imprimen las instrucciones del medicamento slo en las cajas y no directamente en los tubos del [place].   Si su medicamento es muy caro, por favor, pngase en contacto con Zigmund Daniel llamando al [phone] y presione la opcin 4 o envenos un mensaje a travs de Pharmacist, community.   No podemos decirle cul ser su copago por los medicamentos por adelantado ya que esto es diferente dependiendo de la cobertura de su seguro. Sin embargo, es posible que podamos encontrar un medicamento sustituto a Electrical engineer un formulario para que el seguro cubra el medicamento que se considera necesario.   Si se requiere una autorizacin previa para que su compaa de seguros [place] su medicamento, por favor permtanos de 1 a 2 das hbiles para completar este  proceso.  Los precios de los medicamentos varan con frecuencia dependiendo del Environmental consultant de dnde se surte la receta y alguna farmacias pueden ofrecer precios ms baratos.  El sitio web www.goodrx.com tiene cupones para medicamentos de Airline pilot. Los precios aqu no tienen en cuenta lo que podra costar con la ayuda del seguro (puede ser ms barato con su seguro), pero el sitio web puede darle el precio si no utiliz Research scientist (physical sciences).  - Puede imprimir el cupn correspondiente y llevarlo con su receta a la farmacia.  - Tambin puede pasar por nuestra oficina durante el horario de atencin regular y Charity fundraiser una tarjeta de cupones de GoodRx.  - Si necesita que su receta se enve electrnicamente a una farmacia diferente, informe a nuestra oficina a travs de MyChart de [place] o por telfono llamando al [phone] y presione la opcin 4.

## 2022-02-20 NOTE — Progress Notes (Signed)
? ?New Patient Visit ? ?Subjective  ?[name] is a 81 y.o. male who presents for the following: Annual Exam (Upper body check. HxSCC's. Scaly lesion on right forearm ). ? ?The patient presents for Upper Body Skin Exam (UBSE) for skin cancer screening and mole check.  The patient has spots, moles and lesions to be evaluated, some may be new or changing and the patient has concerns that these could be cancer. ? ? ?Review of Systems: No other skin or systemic complaints except as noted in HPI or Assessment and Plan. ? ? ?Objective  ?Well appearing patient in no apparent distress; mood and affect are within normal limits. ? ?All skin waist up examined. ? ?Right Forearm x1, right hand dorsum x1 (2) ?Hyperkeratotic pink scaly papules ? ?Left Temple x1, left paranasal x1, right temple x1, right paranasal x1 (4) ?Erythematous thin papules/macules with gritty scale.  ? ?Left Shoulder - Posterior ?Dark purple blanching macule at left posterior shoulder ? ? ?Assessment & Plan  ? ?Lentigines ?- Scattered tan macules ?- Due to sun exposure ?- Benign-appearing, observe ?- Recommend daily broad spectrum sunscreen SPF 30+ to sun-exposed areas, reapply every 2 hours as needed. ?- Call for any changes ? ?Seborrheic Keratoses ?- Stuck-on, waxy, tan-brown papules and/or plaques  ?- Benign-appearing ?- Discussed benign etiology and prognosis. ?- Observe ?- Call for any changes ? ?Melanocytic Nevi ?- Tan-brown and/or pink-flesh-colored symmetric macules and papules ?- Benign appearing on exam today ?- Observation ?- Call clinic for new or changing moles ?- Recommend daily use of broad spectrum spf 30+ sunscreen to sun-exposed areas.  ? ?Hemangiomas ?- Red papules ?- Discussed benign nature ?- Observe ?- Call for any changes ? ?Actinic Damage ?- Chronic condition, secondary to cumulative UV/sun exposure ?- diffuse scaly erythematous macules with underlying dyspigmentation ?- Recommend daily broad spectrum sunscreen SPF 30+ to  sun-exposed areas, reapply every 2 hours as needed.  ?- Staying in the shade or wearing long sleeves, sun glasses (UVA+UVB protection) and wide brim hats (4-inch brim around the entire circumference of the hat) are also recommended for sun protection.  ?- Call for new or changing lesions. ? ?History of Squamous Cell Carcinoma of the Skin ?- No evidence of recurrence today at left forearm, left lower back paraspinal ?- Recommend regular full body skin exams ?- Recommend daily broad spectrum sunscreen SPF 30+ to sun-exposed areas, reapply every 2 hours as needed.  ?- Call if any new or changing lesions are noted between office visits ? ?  ? ?Skin cancer screening performed today. ? ?Hypertrophic actinic keratosis (2) ?Right Forearm x1, right hand dorsum x1 ? ?Actinic keratoses are precancerous spots that appear secondary to cumulative UV radiation exposure/sun exposure over time. They are chronic with expected duration over 1 year. A portion of actinic keratoses will progress to squamous cell carcinoma of the skin. It is not possible to reliably predict which spots will progress to skin cancer and so treatment is recommended to prevent development of skin cancer. ? ?Recommend daily broad spectrum sunscreen SPF 30+ to sun-exposed areas, reapply every 2 hours as needed.  ?Recommend staying in the shade or wearing long sleeves, sun glasses (UVA+UVB protection) and wide brim hats (4-inch brim around the entire circumference of the hat). ?Call for new or changing lesions.  ? ?RTC 6-8 weeks if lesions have not resolved.  ? ?Destruction of lesion - Right Forearm x1, right hand dorsum x1 ? ?Destruction method: cryotherapy   ?Informed consent: discussed and consent  obtained   ?Lesion destroyed using liquid nitrogen: Yes   ?Region frozen until ice ball extended beyond lesion: Yes   ?Outcome: patient tolerated procedure well with no complications   ?Post-procedure details: wound care instructions given   ?Additional details:   Prior to procedure, discussed risks of blister formation, small wound, skin dyspigmentation, or rare scar following cryotherapy. Recommend Vaseline ointment to treated areas while healing.  ? ?AK (actinic keratosis) (4) ?Left Temple x1, left paranasal x1, right temple x1, right paranasal x1 ? ?Actinic keratoses are precancerous spots that appear secondary to cumulative UV radiation exposure/sun exposure over time. They are chronic with expected duration over 1 year. A portion of actinic keratoses will progress to squamous cell carcinoma of the skin. It is not possible to reliably predict which spots will progress to skin cancer and so treatment is recommended to prevent development of skin cancer. ? ?Recommend daily broad spectrum sunscreen SPF 30+ to sun-exposed areas, reapply every 2 hours as needed.  ?Recommend staying in the shade or wearing long sleeves, sun glasses (UVA+UVB protection) and wide brim hats (4-inch brim around the entire circumference of the hat). ?Call for new or changing lesions. ? ?Destruction of lesion - Left Temple x1, left paranasal x1, right temple x1, right paranasal x1 ? ?Destruction method: cryotherapy   ?Informed consent: discussed and consent obtained   ?Lesion destroyed using liquid nitrogen: Yes   ?Region frozen until ice ball extended beyond lesion: Yes   ?Outcome: patient tolerated procedure well with no complications   ?Post-procedure details: wound care instructions given   ?Additional details:  Prior to procedure, discussed risks of blister formation, small wound, skin dyspigmentation, or rare scar following cryotherapy. Recommend Vaseline ointment to treated areas while healing.  ? ?Hemangioma of skin ?Left Shoulder - Posterior ? ?Benign, observe.   ? ? ?Return in about 1 year (around 02/21/2023) for UBSE. ? ?I, Emelia Salisbury, CMA, am acting as scribe for Brendolyn Patty, MD. ? ?Documentation: I have reviewed the above documentation for accuracy and completeness, and I agree with  the above. ? ?Brendolyn Patty MD  ? ? ?

## 2022-03-09 ENCOUNTER — Other Ambulatory Visit: Payer: Self-pay | Admitting: Student

## 2022-03-20 ENCOUNTER — Ambulatory Visit: Payer: Medicare PPO

## 2022-03-20 DIAGNOSIS — I480 Paroxysmal atrial fibrillation: Secondary | ICD-10-CM

## 2022-03-20 DIAGNOSIS — I25810 Atherosclerosis of coronary artery bypass graft(s) without angina pectoris: Secondary | ICD-10-CM | POA: Diagnosis not present

## 2022-03-27 ENCOUNTER — Ambulatory Visit: Payer: Medicare PPO | Admitting: Student

## 2022-03-27 ENCOUNTER — Encounter: Payer: Self-pay | Admitting: Student

## 2022-03-27 VITALS — BP 112/75 | HR 85 | Temp 98.0°F | Resp 16 | Ht 71.0 in | Wt 247.0 lb

## 2022-03-27 DIAGNOSIS — G4733 Obstructive sleep apnea (adult) (pediatric): Secondary | ICD-10-CM

## 2022-03-27 DIAGNOSIS — I493 Ventricular premature depolarization: Secondary | ICD-10-CM | POA: Diagnosis not present

## 2022-03-27 DIAGNOSIS — I25810 Atherosclerosis of coronary artery bypass graft(s) without angina pectoris: Secondary | ICD-10-CM | POA: Diagnosis not present

## 2022-03-27 NOTE — Progress Notes (Signed)
? ?Primary Physician:  Shirline Frees, MD ? ? ?Patient ID: [name], male    DOB: Jul 28, 1941, 81 y.o.   MRN: [id number] ? ?Subjective:  ? ? ?Chief Complaint  ?Patient presents with  ? Coronary Artery Disease  ? Atrial Fibrillation  ? HFrEF  ? Follow-up  ?  6 month  ? ? ?HPI: [name]  is a 81 y.o. male  with known coronary artery disease and CABG in 2007 in the setting of non-STEMI in atrial fibrillation with RVR. He has a history of  45 pack year h/o smoking tobacco, ascending aortic aneurysm (follows Dr.Bryan Bartle), small abdominal aortic aneurysm noted in 2015 (3cm), H/O colon cancer, in remission since 1992, GERD, hyperlipidemia, obstructive sleep apnea now on CPAP.  ? ?Patient hospitalized 03/2021 with recurrence of atrial flutter and echocardiogram revealed new onset LVEF 35-40% with global hypokinesis. Patient underwent successful cardioversion 05/23/2021 with return to normal sinus rhythm.  TEE prior to cardioversion noted ASD, which was subsequently repaired by Dr. Einar Gip 06/13/2021. Due to concerns of bradycardia patient underwent cardiac monitor which revealed a single asymptomatic episode of VT lasting 4 beats, episodes of SVT as well as intermittent Mobitz type I block which was asymptomatic and during sleep hours.  Monitor did reveal frequent PVCs, there were no patient triggered events.  No evidence of atrial fibrillation on the monitor. Patient opted to reduce Lopressor from 50 mg to 25 mg twice daily given soft blood pressure. Repeat echocardiogram in 03/20/2022 revealed LVEF of 51-88%, grade 1 diastolic dysfunction, no significant valvular abnormalities. ? ?Patient presents for 2 month follow up. Patient is feeling improved overall with improvement of fatigue and no recurrence of bradycardia on home monitoring. His sleep apnea remains untreated which is likely contributing to some lingering fatigue.  Denies chest pain, orthopnea, PND, dyspnea, leg edema. ? ?Past Medical History:   ?Diagnosis Date  ? Allergy   ? Arthritis   ? BPH (benign prostatic hyperplasia)   ? CAD (coronary artery disease)   ? s/p CABG 2007; NSTEMI in setting of AFib with RVR 12/2009; cath 1/11: S-RCA ok with prox 30-40% and 50% stenoses; S-OM and RI  with patent OM limb but an occluded RI limb; S-D2 and L-LAD ok; EF 55%  ? Cataract   ? removed both eyes   ? Clotting disorder ([place])   ? s/p CABG x 5- PE   ? Colon cancer ([place]) 1992  ? COPD (chronic obstructive pulmonary disease) ([place])   ? Diverticulosis   ? Essential hypertension   ? Family history of breast cancer   ? Family history of colon cancer   ? Family history of lung cancer   ? GERD (gastroesophageal reflux disease)   ? History of pulmonary embolus (PE) 2007  ? Following CABG  ? Hyperlipidemia   ? 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 OSA on CPAP   ? Paroxysmal atrial fibrillation (H[facility])   ? Previously on Multaq, declines anticoagulation  ? Personal history of colonic polyps 01/22/2000  ? Tubular adenoma  ? RBBB (right bundle branch block)   ? Sleep apnea   ? no cpap   ? Squamous cell carcinoma of skin 02/11/2008  ? [place]-Left paraspinal, lower   ? 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  ? in situ-left forearm  ? ?Past Surgical History:  ?Procedure Laterality Date  ? CARDIOVERSION N/A 05/23/2021  ? Procedure: CARDIOVERSION;  Surgeon: Nigel Mormon, MD;  Location: [facility];  Service:  Cardiovascular;  Laterality: N/A;  ? COLECTOMY  1992  ? w/ colostomy  related to colon cancer  ? COLON SURGERY  1993  ? to take down Colostomy  ? COLONOSCOPY    ? CORONARY ARTERY BYPASS GRAFT  2007  ? CORONARY STENT INTERVENTION N/A 10/07/2018  ? Procedure: CORONARY STENT INTERVENTION;  Surgeon: Adrian Prows, MD;  Location: [place] [facility];  Service: Cardiovascular;  Laterality: N/A;  ? [place]  ? tendon repair and nerve; left  ? INGUINAL HERNIA REPAIR  2009  ? LAPAROSCOPIC CHOLECYSTECTOMY  2004  ? PATENT FORAMEN OVALE(PFO) CLOSURE N/A  06/13/2021  ? Procedure: PATENT FORAMEN OVALE (PFO) CLOSURE;  Surgeon: Adrian Prows, MD;  Location: [facility];  Service: Cardiovascular;  Laterality: N/A;  ? POLYPECTOMY    ? RIGHT HEART CATH N/A 06/13/2021  ? Procedure: RIGHT HEART CATH;  Surgeon: Adrian Prows, MD;  Location: [facility];  Service: Cardiovascular;  Laterality: N/A;  ? RIGHT/LEFT HEART CATH AND CORONARY/GRAFT ANGIOGRAPHY N/A 07/03/2017  ? Procedure: Right/Left Heart Cath and Coronary/Graft Angiography;  Surgeon: Leonie Man, MD;  Location: [facility];  Service: Cardiovascular;  Laterality: N/A;  ? RIGHT/LEFT HEART CATH AND CORONARY/GRAFT ANGIOGRAPHY N/A 10/07/2018  ? Procedure: RIGHT/LEFT HEART CATH AND CORONARY/GRAFT ANGIOGRAPHY;  Surgeon: Adrian Prows, MD;  Location: [facility];  Service: Cardiovascular;  Laterality: N/A;  ? TEE WITHOUT CARDIOVERSION  05/23/2021  ? Procedure: TRANSESOPHAGEAL ECHOCARDIOGRAM (TEE);  Surgeon: Nigel Mormon, MD;  Location: [facility];  Service: Cardiovascular;;  ? UMBILICAL HERNIA REPAIR    ? [place]  2009  ? ?Family History  ?Problem Relation Age of Onset  ? Lung cancer Mother   ? Heart disease Mother   ? Emphysema Father   ? Heart disease Father   ? Cancer Sister 62  ?     unknown type  ? Breast cancer Sister   ?     dx. >50  ? Colon cancer Sister   ?     dx. >50  ? Breast cancer Sister   ?     dx. >50  ? Colon cancer Sister   ?     dx. >50  ? Colon cancer Cousin   ?     dx. early 11s  ? Esophageal cancer Neg Hx   ? Rectal cancer Neg Hx   ? Stomach cancer Neg Hx   ? Colon polyps Neg Hx   ? ?Social History  ? ?Tobacco Use  ? Smoking status: Former  ?  Packs/day: 1.00  ?  Years: 45.00  ?  Pack years: 45.00  ?  Types: Cigarettes  ?  Start date: 12  ?  Quit date: 12/18/1995  ?  Years since quitting: 26.2  ? Smokeless tobacco: Never  ?Substance Use Topics  ? Alcohol use: No  ?  Comment: quit in 1980  ? Marital Status: Married ? ?ROS  ? ?Review of Systems  ?Constitutional:  Positive for malaise/fatigue (improved).  ?Cardiovascular:  Positive for leg swelling (intermittent). Negative for chest pain, claudication, near-syncope, orthopnea, palpitations (improved), paroxysmal nocturnal dyspnea and syncope.  ?Respiratory:  Negative for shortness of breath.   ? ?Objective:  ?Blood pressure 112/75, pulse 85, temperature 98 ?F (36.7 ?C), resp. rate 16, height '5\' 11"'$  (1.803 m), weight 247 lb (112 kg), SpO2 95 %. Body mass index is 34.45 kg/m?.  ? ?  03/27/2022  ?  3:17 PM 01/25/2022  ?  2:38 PM 01/24/2022  ? 10:58 AM  ?Vitals with BMI  ?Height '5\' 11"'$  '5\' 11"'$  '5\' 11"'$   ?Weight 247 lbs 243 lbs 10 oz 242 lbs  ?BMI 34.46 33.99 33.77  ?Systolic 884 166 063  ?Diastolic 75 66 62  ?Pulse 85 81 72  ? ?Physical Exam ?Vitals reviewed.  ?Constitutional:   ?   Appearance: He is well-developed. He is obese.  ?Neck:  ?   Vascular: No carotid bruit.  ?Cardiovascular:  ?   Rate and Rhythm: Normal rate and regular rhythm. No extrasystoles are present. ?   Pulses: Intact distal pulses.     ?     Femoral pulses are 2+ on the right side and 2+ on the left side. ?     Popliteal pulses are 1+ on the right side and 1+ on the left side.  ?     Dorsalis pedis pulses are 1+ on the right side and 1+ on the left side.  ?     Posterior tibial pulses are 2+ on the right side and 2+ on the left side.  ?   Heart sounds: Normal heart sounds, S1 normal and S2 normal. No murmur heard. ?  No gallop.  ?Pulmonary:  ?   Effort: Pulmonary effort is normal. No accessory muscle usage.  ?   Breath sounds: Normal breath sounds.  ?Musculoskeletal:  ?   Right lower leg: No edema.  ?   Left lower leg: No edema.  ? ?Laboratory examination:  ? ? ?  Latest Ref Rng & Units 10/11/2021  ?  1:32 PM 06/13/2021  ?  8:54 AM 06/13/2021  ?  8:39 AM  ?CMP  ?Glucose 70 - 99 mg/dL 113      ?BUN 8 - 27 mg/dL 12      ?Creatinine 0.76 - 1.27 mg/dL 1.32      ?Sodium 134 - 144 mmol/L 140   140   141    ?Potassium 3.5 - 5.2 mmol/L 4.1   3.8   3.8    ?Chloride 96 - 106  mmol/L 102      ?CO2 20 - 29 mmol/L 22      ?Calcium 8.6 - 10.2 mg/dL 9.2      ? ? ?  Latest Ref Rng & Units 06/13/2021  ?  8:54 AM 06/13/2021  ?  8:39 AM 06/13/2021  ?  8:32 AM  ?CBC  ?Hemoglobin 13.0 - 17.0 g/dL 13.9

## 2022-04-24 ENCOUNTER — Encounter: Payer: Self-pay | Admitting: Emergency Medicine

## 2022-04-24 ENCOUNTER — Ambulatory Visit: Payer: Medicare PPO | Admitting: Emergency Medicine

## 2022-04-24 DIAGNOSIS — J449 Chronic obstructive pulmonary disease, unspecified: Secondary | ICD-10-CM

## 2022-04-24 DIAGNOSIS — G4733 Obstructive sleep apnea (adult) (pediatric): Secondary | ICD-10-CM

## 2022-04-24 NOTE — Assessment & Plan Note (Signed)
You would benefit from getting back on treatment for your sleep apnea if possible.  We will refer you to sleep medicine to discuss whether you may benefit from Inspire hypoglossal nerve stimulator.  If not they may be able to give [place] advice on ways to make it easier to get back on CPAP ?

## 2022-04-24 NOTE — Progress Notes (Signed)
? ?Subjective:  ? ? Patient ID: [name], male    DOB: 01-Apr-1941, 81 y.o.   MRN: [id number] ? ?Shortness of Breath ?Pertinent negatives include no congestion, coughing, fatigue, fever, headaches, joint swelling, myalgias, nausea, rash, sore throat or vomiting.  ? ?ROV 01/23/22 --follow-up visit for 81 year old gentleman with a history of mild COPD, OSA, allergic rhinitis.  He has been currently managed on Stiolto, Mucinex if needed, Flonase.  He uses albuterol ?He has noticed a worsening in his SOB since last time. Happens at rest or w exertion. He has to stop when doing housework. SOB when bending over. He has cough some episodes of bradycardia w A fib. No change in his wt. He has noticed some wheeze in the evening when he is sitting still. Uses albuterol 1-2x a day, may help him some.  ?CPAP compliance is poor - he never got back on it reliably.  ? ?ROV 04/24/22 --81 year old man with OSA (not on CPAP), mild COPD, allergic rhinitis.  PMH significant for CAD/CABG, atrial fibrillation, history of colon cancer (1992) hyperlipidemia.  ?We have been managing him on Stiolto, was unclear whether it was benefiting him so he stopped it. ?He reports that he is not using his CPAP - he is interested in restarting but has not done so yet ?He moves around slowly. Has a lack of energy as opposed to overt SOB. He stopped stiolto, doesn't believe he misses it. He uses albuterol about once a day - does believe that he gets benefit.  ? ? ? ?Review of Systems  ?Constitutional:  Negative for fatigue, fever and unexpected weight change.  ?HENT:  Negative for congestion, dental problem, ear pain, nosebleeds, postnasal drip, rhinorrhea, sinus pressure, sneezing, sore throat and trouble swallowing.   ?Eyes:  Negative for redness and itching.  ?Respiratory:  Positive for shortness of breath. Negative for cough, chest tightness and wheezing.   ?Cardiovascular:  Negative for palpitations and leg swelling.  ?Gastrointestinal:  Negative for  nausea and vomiting.  ?Genitourinary:  Negative for dysuria.  ?Musculoskeletal:  Negative for joint swelling and myalgias.  ?Skin:  Negative for rash.  ?Neurological:  Negative for headaches.  ?Hematological:  Does not bruise/bleed easily.  ?Psychiatric/Behavioral:  Negative for dysphoric mood. The patient is not nervous/anxious.   ? ?   ?Objective:  ? Physical Exam ?Vitals:  ? 04/24/22 1541  ?BP: 138/76  ?Pulse: 88  ?Temp: 98.5 ?F (36.9 ?C)  ?TempSrc: Oral  ?SpO2: 93%  ?Weight: 244 lb 6.4 oz (110.9 kg)  ?Height: '5\' 11"'$  (1.803 m)  ? ?Gen: Pleasant, obese, in no distress,  normal affect ? ?ENT: No lesions,  mouth clear,  oropharynx clear, no postnasal drip ? ?Neck: No JVD, no stridor ? ?Lungs: No use of accessory muscles, no wheeze, decreased at bases ? ?Cardiovascular:  regular, no M. Trace ankle edema.  ? ?Musculoskeletal: amputation R index finger, no cyanosis or clubbing ? ?Neuro: alert, non focal ? ?Skin: Warm, no lesions or rashes ? ?   ?Assessment & Plan:  ?COPD (chronic obstructive pulmonary disease) ([place]) ?He does seem to get some relief when he uses albuterol intermittently.  Stopped his Stiolto because he was not sure it was helping him.  We will stay off scheduled BD for now and keep the albuterol available to use as needed.  Low threshold to put him back on scheduled medication depending on clinical progression ? ?Obstructive sleep apnea ?You would benefit from getting back on treatment for your sleep apnea if possible.  We will refer you to sleep medicine to discuss whether you may benefit from Inspire hypoglossal nerve stimulator.  If not they may be able to give [place] advice on ways to make it easier to get back on CPAP ? ? ? ? ?Baltazar Apo, MD, PhD ?04/24/2022, 3:55 PM ?[facility] ?[phone] or if no answer [phone] ? ?

## 2022-04-24 NOTE — Patient Instructions (Addendum)
We will hold off on restarting a scheduled inhaler medication right now. ?Continue to use your albuterol either 2 puffs or 1 nebulizer treatment when you needed for shortness of breath, chest tightness, wheezing. ?You would benefit from getting back on treatment for your sleep apnea if possible.  We will refer you to sleep medicine to discuss whether you may benefit from Inspire hypoglossal nerve stimulator.  If not they may be able to give [place] advice on ways to make it easier to get back on CPAP ?Follow with Dr. Lamonte Sakai in 12 months or sooner if you have any problems.  ? ?

## 2022-04-24 NOTE — Assessment & Plan Note (Signed)
He does seem to get some relief when he uses albuterol intermittently.  Stopped his Stiolto because he was not sure it was helping him.  We will stay off scheduled BD for now and keep the albuterol available to use as needed.  Low threshold to put him back on scheduled medication depending on clinical progression ?

## 2022-05-31 ENCOUNTER — Telehealth: Payer: Self-pay | Admitting: Student

## 2022-05-31 NOTE — Telephone Encounter (Signed)
Patient's wife says he is having severe swelling, "cannot zip up his pants" per patient's wife. Please call them back as soon as possible.

## 2022-06-01 NOTE — Telephone Encounter (Signed)
Called patient and wife, transferred to [facility] ([id number]) to schedule an appointment

## 2022-06-06 ENCOUNTER — Ambulatory Visit: Payer: Medicare PPO | Admitting: Student

## 2022-06-06 ENCOUNTER — Encounter: Payer: Self-pay | Admitting: Student

## 2022-06-06 VITALS — BP 120/67 | HR 79 | Temp 97.3°F | Resp 16 | Ht 71.0 in | Wt 242.0 lb

## 2022-06-06 DIAGNOSIS — I5032 Chronic diastolic (congestive) heart failure: Secondary | ICD-10-CM

## 2022-06-06 DIAGNOSIS — I25810 Atherosclerosis of coronary artery bypass graft(s) without angina pectoris: Secondary | ICD-10-CM

## 2022-06-06 NOTE — Progress Notes (Signed)
Primary Physician:  Shirline Frees, MD   Patient ID: [name], male    DOB: 06/29/1941, 81 y.o.   MRN: [id number]  Subjective:    Chief Complaint  Patient presents with   Edema   Follow-up    HPI: [name]  is a 81 y.o. male  with known coronary artery disease and CABG in 2007 in the setting of non-STEMI in atrial fibrillation with RVR. He has a history of  45 pack year h/o smoking tobacco, ascending aortic aneurysm (follows Dr.Bryan Bartle), small abdominal aortic aneurysm noted in 2015 (3cm), H/O colon cancer, in remission since 1992, GERD, hyperlipidemia, obstructive sleep apnea now on CPAP.   Patient hospitalized 03/2021 with recurrence of atrial flutter and echocardiogram revealed new onset LVEF 35-40% with global hypokinesis. Patient underwent successful cardioversion 05/23/2021 with return to normal sinus rhythm.  TEE prior to cardioversion noted ASD, which was subsequently repaired by Dr. Einar Gip 06/13/2021. Due to concerns of bradycardia patient underwent cardiac monitor which revealed a single asymptomatic episode of VT lasting 4 beats, episodes of SVT as well as intermittent Mobitz type I block which was asymptomatic and during sleep hours.  Monitor did reveal frequent PVCs, there were no patient triggered events.  No evidence of atrial fibrillation on the monitor. Patient opted to reduce Lopressor from 50 mg to 25 mg twice daily given soft blood pressure. Repeat echocardiogram in 03/20/2022 revealed LVEF of 29-52%, grade 1 diastolic dysfunction, no significant valvular abnormalities.  Patient presents for urgent visit today as he noted abdominal swelling and shortness of breath on 05/31/2022.  Symptoms completely resolved the following day after taking his daily torsemide.  She does admit to dietary indiscretion with high sodium intake around the same time of the swelling.  He is currently feeling well overall with no recurrence of swelling or shortness of breath since  05/31/2022.  Denies orthopnea, PND, leg edema.  Past Medical History:  Diagnosis Date   Allergy    Arthritis    BPH (benign prostatic hyperplasia)    CAD (coronary artery disease)    s/p CABG 2007; NSTEMI in setting of AFib with RVR 12/2009; cath 1/11: S-RCA ok with prox 30-40% and 50% stenoses; S-OM and RI  with patent OM limb but an occluded RI limb; S-D2 and L-LAD ok; EF 55%   Cataract    removed both eyes    Clotting disorder (HCC)    s/p CABG x 5- PE    Colon cancer ([place]) 1992   COPD (chronic obstructive pulmonary disease) ([place])    Diverticulosis    Essential hypertension    Family history of breast cancer    Family history of colon cancer    Family history of lung cancer    GERD (gastroesophageal reflux disease)    History of pulmonary embolus (PE) 2007   Following CABG   Hyperlipidemia    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 ENDOSCOPY;  Service: Cardiovascular;  Laterality: N/A;   COLECTOMY  1992   w/ colostomy  related to colon cancer   [place]   to take down Colostomy   COLONOSCOPY     CORONARY ARTERY BYPASS GRAFT  2007   CORONARY STENT INTERVENTION N/A 10/07/2018   Procedure: CORONARY STENT INTERVENTION;  Surgeon: Adrian Prows, MD;  Location: [place] CV LAB;  Service: Cardiovascular;  Laterality: N/A;   HAND [place]   tendon repair and nerve; left   INGUINAL HERNIA REPAIR  2009    LAPAROSCOPIC CHOLECYSTECTOMY  2004   PATENT FORAMEN OVALE(PFO) CLOSURE N/A 06/13/2021   Procedure: PATENT FORAMEN OVALE (PFO) CLOSURE;  Surgeon: Adrian Prows, MD;  Location: [place] [facility];  Service: Cardiovascular;  Laterality: N/A;   POLYPECTOMY     RIGHT HEART CATH N/A 06/13/2021   Procedure: RIGHT HEART CATH;  Surgeon: Adrian Prows, MD;  Location: [place] [facility];  Service: Cardiovascular;  Laterality: N/A;   RIGHT/LEFT HEART CATH AND CORONARY/GRAFT ANGIOGRAPHY N/A 07/03/2017   Procedure: Right/Left Heart Cath and Coronary/Graft Angiography;  Surgeon: Leonie Man, MD;  Location: [place] [facility];  Service: Cardiovascular;  Laterality: N/A;   RIGHT/LEFT HEART CATH AND CORONARY/GRAFT ANGIOGRAPHY N/A 10/07/2018   Procedure: RIGHT/LEFT HEART CATH AND CORONARY/GRAFT ANGIOGRAPHY;  Surgeon: Adrian Prows, MD;  Location: [place] [facility];  Service: Cardiovascular;  Laterality: N/A;   TEE WITHOUT CARDIOVERSION  05/23/2021   Procedure: TRANSESOPHAGEAL ECHOCARDIOGRAM (TEE);  Surgeon: Nigel Mormon, MD;  Location: [facility] ENDOSCOPY;  Service: Cardiovascular;;   UMBILICAL HERNIA REPAIR     VENTRAL HERNIA REPAIR  2009   Family History  Problem Relation Age of Onset   Lung cancer Mother    Heart disease Mother    Emphysema Father    Heart disease Father    Cancer Sister 59       unknown type   Breast cancer Sister        dx. >50   Colon cancer Sister        dx. >50   Breast cancer Sister        dx. >50   Colon cancer Sister        dx. >50   Colon cancer Cousin        dx. early 41s   Esophageal cancer Neg Hx    Rectal cancer Neg Hx    Stomach cancer Neg Hx    Colon polyps Neg Hx    Social History   Tobacco Use   Smoking status: Former    Packs/day: 1.00    Years: 45.00    Total pack years: 45.00    Types: Cigarettes    Start date: 1955    Quit date: 12/18/1995    Years since quitting: 26.4   Smokeless tobacco: Never  Substance Use Topics   Alcohol use: No    Comment: quit  in 1980   Marital Status: Married  ROS   Review of Systems  Cardiovascular:  Positive for leg swelling (intermittent). Negative for chest pain, claudication, dyspnea on exertion, near-syncope, orthopnea, palpitations, paroxysmal nocturnal dyspnea and syncope.    Objective:  Blood pressure 120/67, pulse 79, temperature (!) 97.3 F (36.3 C), temperature source Temporal, resp. rate 16, height '5\' 11"'$  (1.803 m), weight 242 lb (109.8 kg), SpO2 95 %. Body mass index is 33.75 kg/m.     06/06/2022  1:37 PM 04/24/2022    3:41 PM 03/27/2022    3:17 PM  Vitals with BMI  Height '5\' 11"'$  '5\' 11"'$  '5\' 11"'$   Weight 242 lbs 244 lbs 6 oz 247 lbs  BMI 33.77 11.9 41.74  Systolic 081 448 185  Diastolic 67 76 75  Pulse 79 88 85   Physical Exam Vitals reviewed.  Constitutional:      Appearance: He is well-developed. He is obese.  Neck:     Vascular: No carotid bruit.  Cardiovascular:     Rate and Rhythm: Normal rate and regular rhythm. No extrasystoles are present.    Pulses: Intact distal pulses.          Femoral pulses are 2+ on the right side and 2+ on the left side.      Popliteal pulses are 1+ on the right side and 1+ on the left side.       Dorsalis pedis pulses are 1+ on the right side and 1+ on the left side.       Posterior tibial pulses are 2+ on the right side and 2+ on the left side.     Heart sounds: Normal heart sounds, S1 normal and S2 normal. No murmur heard.    No gallop.  Pulmonary:     Effort: Pulmonary effort is normal. No accessory muscle usage.     Breath sounds: Normal breath sounds.  Abdominal:     General: Bowel sounds are normal. There is no distension.     Palpations: Abdomen is soft.  Musculoskeletal:     Right lower leg: No edema.     Left lower leg: No edema.    Laboratory examination:      Latest Ref Rng & Units 10/11/2021    1:32 PM 06/13/2021    8:54 AM 06/13/2021    8:39 AM  CMP  Glucose 70 - 99 mg/dL 113     BUN 8 - 27 mg/dL 12     Creatinine 0.76 -  1.27 mg/dL 1.32     Sodium 134 - 144 mmol/L 140  140  141   Potassium 3.5 - 5.2 mmol/L 4.1  3.8  3.8   Chloride 96 - 106 mmol/L 102     CO2 20 - 29 mmol/L 22     Calcium 8.6 - 10.2 mg/dL 9.2         Latest Ref Rng & Units 06/13/2021    8:54 AM 06/13/2021    8:39 AM 06/13/2021    8:32 AM  CBC  Hemoglobin 13.0 - 17.0 g/dL 13.9  13.9  13.6   Hematocrit 39.0 - 52.0 % 41.0  41.0  40.0    Lipid Panel     Component Value Date/Time   CHOL 134 09/08/2015 1210   TRIG 211.0 (H) 09/08/2015 1210   HDL 36.10 (L) 09/08/2015 1210   CHOLHDL 4 09/08/2015 1210   VLDL 42.2 (H) 09/08/2015 1210   LDLCALC 59 07/23/2014 1638   LDLDIRECT 87.0 09/08/2015 1210   HEMOGLOBIN A1C No results found for: "HGBA1C", "MPG" TSH No results for input(s): "TSH" in the last 8760 hours.   External Labs: 08/07/2021: HDL 36, LDL 49, total cholesterol 109, triglycerides 134 A1c 6.4% BUN 15, creatinine 1.3, GFR 55 TSH 0.63  Cholesterol, total 128.000 m 06/22/2020 HDL 34.000 mg 06/22/2020 LDL-C 63.000 mg 06/22/2020 Triglycerides 183.000 m 06/22/2020  Hemoglobin 14.900 g/d 06/22/2020  Creatinine, Serum 1.250 mg/ 06/22/2020 Potassium 3.900 mm 06/22/2020 ALT (SGPT) 30.000 U/L 06/22/2020  TSH 2.120  06/22/2020   Allergies   Allergies  Allergen Reactions   Morphine Sulfate Itching   Rosuvastatin Calcium Other (See Comments)    Muscle weakness   Tramadol Itching    Other reaction(s): Unknown    Medications Prior to Visit:   Outpatient Medications Prior to Visit  Medication Sig Dispense Refill   albuterol (PROVENTIL) (2.5 MG/3ML) 0.083% nebulizer solution USE 1 VIAL IN NEBULIZER EVERY 6 HOURS AS NEEDED FOR WHEEZING OR SHORTNESS OF BREATH 75 mL 3   albuterol (VENTOLIN HFA) 108 (90 Base) MCG/ACT inhaler INHALE 2 PUFFS BY MOUTH EVERY 6 HOURS AS NEEDED FOR WHEEZING OR SHORTNESS OF BREATH 9 g 1   atorvastatin (LIPITOR) 20 MG tablet Take 20 mg by mouth at bedtime.   5   ciclopirox (PENLAC) 8 % solution Apply topically at  bedtime. Apply over nail and surrounding skin. Apply daily over previous coat. After seven (7) days, may remove with alcohol and continue cycle. 6.6 mL 2   clonazePAM (KLONOPIN) 1 MG tablet Take 1 mg by mouth at bedtime as needed (sleep).     ELIQUIS 5 MG TABS tablet Take 1 tablet by mouth twice daily 180 tablet 0   fluticasone (FLONASE) 50 MCG/ACT nasal spray Place 2 sprays into both nostrils daily. 16 g 2   gabapentin (NEURONTIN) 100 MG capsule Take 100-200 mg by mouth See admin instructions. Take 200 mg am & 100 mg at bedtime     guaiFENesin (MUCINEX) 600 MG 12 hr tablet Take 600 mg by mouth daily as needed (bronchitis).     HYDROcodone-acetaminophen (NORCO/VICODIN) 5-325 MG tablet Take 1 tablet by mouth every 6 (six) hours as needed for moderate pain.     isosorbide mononitrate (IMDUR) 30 MG 24 hr tablet Take 1 tablet by mouth once daily 90 tablet 1   Magnesium 200 MG TABS Take 200 mg by mouth in the morning.     metoprolol tartrate (LOPRESSOR) 25 MG tablet Take 1 tablet (25 mg total) by mouth 2 (two) times daily. 180 tablet 3   Multiple Vitamin (MULTIVITAMIN WITH MINERALS) TABS tablet Take 1 tablet by mouth in the morning.     nitroGLYCERIN (NITROSTAT) 0.4 MG SL tablet Place 1 tablet (0.4 mg total) under the tongue every 5 (five) minutes as needed for chest pain. 25 tablet 3   Omega-3 Fatty Acids (FISH OIL) 1000 MG CAPS Take 1,000 mg by mouth in the morning.     omeprazole (PRILOSEC) 20 MG capsule Take 1 capsule (20 mg total) by mouth daily. 30 capsule 3   Phenylephrine-DM-GG (ROBITUSSIN COUGH/COLD CF MAX PO) Take 20 mLs by mouth every 6 (six) hours as needed (cough).     Potassium Chloride ER 20 MEQ TBCR Take 20 mEq by mouth in the morning.     STIOLTO RESPIMAT 2.5-2.5 MCG/ACT AERS INHALE 2 PUFFS BY MOUTH ONCE DAILY 4 g 5   tamsulosin (FLOMAX) 0.4 MG CAPS capsule Take 0.8 mg by mouth at bedtime.     tiZANidine (ZANAFLEX) 4 MG tablet Take 4 mg by mouth at bedtime as needed for muscle spasms.      torsemide (DEMADEX) 20 MG tablet Take 40 mg by mouth in the morning.     No facility-administered medications prior to visit.   Final Medications at End of Visit    Current Meds  Medication Sig   albuterol (PROVENTIL) (2.5 MG/3ML) 0.083% nebulizer solution USE 1 VIAL IN NEBULIZER EVERY 6 HOURS AS NEEDED FOR WHEEZING OR SHORTNESS OF BREATH  albuterol (VENTOLIN HFA) 108 (90 Base) MCG/ACT inhaler INHALE 2 PUFFS BY MOUTH EVERY 6 HOURS AS NEEDED FOR WHEEZING OR SHORTNESS OF BREATH   atorvastatin (LIPITOR) 20 MG tablet Take 20 mg by mouth at bedtime.    ciclopirox (PENLAC) 8 % solution Apply topically at bedtime. Apply over nail and surrounding skin. Apply daily over previous coat. After seven (7) days, may remove with alcohol and continue cycle.   clonazePAM (KLONOPIN) 1 MG tablet Take 1 mg by mouth at bedtime as needed (sleep).   ELIQUIS 5 MG TABS tablet Take 1 tablet by mouth twice daily   fluticasone (FLONASE) 50 MCG/ACT nasal spray Place 2 sprays into both nostrils daily.   gabapentin (NEURONTIN) 100 MG capsule Take 100-200 mg by mouth See admin instructions. Take 200 mg am & 100 mg at bedtime   guaiFENesin (MUCINEX) 600 MG 12 hr tablet Take 600 mg by mouth daily as needed (bronchitis).   HYDROcodone-acetaminophen (NORCO/VICODIN) 5-325 MG tablet Take 1 tablet by mouth every 6 (six) hours as needed for moderate pain.   isosorbide mononitrate (IMDUR) 30 MG 24 hr tablet Take 1 tablet by mouth once daily   Magnesium 200 MG TABS Take 200 mg by mouth in the morning.   metoprolol tartrate (LOPRESSOR) 25 MG tablet Take 1 tablet (25 mg total) by mouth 2 (two) times daily.   Multiple Vitamin (MULTIVITAMIN WITH MINERALS) TABS tablet Take 1 tablet by mouth in the morning.   nitroGLYCERIN (NITROSTAT) 0.4 MG SL tablet Place 1 tablet (0.4 mg total) under the tongue every 5 (five) minutes as needed for chest pain.   Omega-3 Fatty Acids (FISH OIL) 1000 MG CAPS Take 1,000 mg by mouth in the morning.    omeprazole (PRILOSEC) 20 MG capsule Take 1 capsule (20 mg total) by mouth daily.   Phenylephrine-DM-GG (ROBITUSSIN COUGH/COLD CF MAX PO) Take 20 mLs by mouth every 6 (six) hours as needed (cough).   Potassium Chloride ER 20 MEQ TBCR Take 20 mEq by mouth in the morning.   STIOLTO RESPIMAT 2.5-2.5 MCG/ACT AERS INHALE 2 PUFFS BY MOUTH ONCE DAILY   tamsulosin (FLOMAX) 0.4 MG CAPS capsule Take 0.8 mg by mouth at bedtime.   tiZANidine (ZANAFLEX) 4 MG tablet Take 4 mg by mouth at bedtime as needed for muscle spasms.   torsemide (DEMADEX) 20 MG tablet Take 40 mg by mouth in the morning.   Radiology    Chest x-ray 03/25/2021: 1. Low volume film with cardiomegaly and vascular congestion. 2. Patchy airspace disease in the left base compatible with atelectasis or pneumonia. 3. Tiny nodular density peripheral right mid lung is stable, suggesting benign etiology.  CTA of chest 09/16/2019:  4.4 cm ascending thoracic aortic aneurysm. Recommend annual imaging followup by CTA or MRA. This recommendation follows 2010 ACCF/AHA/AATS/ACR/ASA/SCA/SCAI/SIR/STS/SVM Guidelines for the Diagnosis and Management of Patients with Thoracic Aortic Disease. Circulation. 2010; 121: S923-R007. Aortic aneurysm NOS (ICD10-I71.9)   Old granulomatous disease. No acute cardiopulmonary disease. Aortic Atherosclerosis (ICD10-I70.0).  Cardiac Studies:   CABG 2007: LIMA to LAD, SVG to D2, SVG to OM1 and ramus intermediate with OM1 limb occluded, SVG to RCA with a proximal 50% stenosis by coronary angiography in July 2018. Normal LVEF.  Lexiscan sestamibi stress test 12/03/2016: No significant ST-T wave changes during stress test. Medium defect of moderate intensity in the basal inferior, mid inferoseptal, mid inferior and mid inferolateral and apical inferior and apical lateral location consistent with Myocardial infarction mild degree of peri-infarct ischemia. LVEF 52%. Intermediate risk study.  Coronary angiogram 10/08/2018 and  had a very high-grade stenosis of a large hiatal D1/ramus branch S/P 3.0 x 23 mm Sierra Xience DES. Patent LIMA to LAD, SVG to D2 and SVG to RCA widely patent. Normal right heart catheterization, preserved cardiac output and cardiac index.  TEE 05/23/2021:  1. Left ventricular ejection fraction, by estimation, is 35 to 40%. The left ventricle has moderately decreased function. The left ventricle demonstrates global hypokinesis.   2. Right ventricular systolic function is moderately reduced. The right ventricular size is moderately enlarged.   3. Left atrial size was severely dilated. No left atrial/left atrial appendage thrombus was detected.   4. Right atrial size was moderately dilated.   5. The mitral valve is grossly normal. Mild mitral valve regurgitation.   6. The aortic valve is tricuspid. Aortic valve regurgitation is not visualized.   7. Evidence of atrial level shunting detected by color flow Doppler. Septum primum, as well as septum secundum defects seen. 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 Multiple ASD's are noted.   8. No prior TEE for comparison.   Ambulatory cardiac telemetry 3 days (12/28/2021 - 01/01/2020): Predominant underlying rhythm was sinus with first-degree AV block and bundle branch block.  Patient with single asymptomatic episode of VT lasting 4 beats.  6 episodes of SVT with the longest lasting 15 beats, which were asymptomatic.  Episodes of Mobitz type I AV block, asymptomatic and during sleep hours.  Rare PACs with frequent PVCs, both ventricular bigeminy and trigeminy were present.  There were no patient triggered events.  No evidence of pauses >3 seconds, high degree AV block, or atrial fibrillation. PVC burden 5.7%  PCV ECHOCARDIOGRAM COMPLETE 03/20/2022 Left ventricle cavity is normal in size. Moderate concentric hypertrophy of the left ventricle. Normal global wall motion. Normal LV systolic function with  visual EF 50-55%. Doppler evidence of grade I (impaired) diastolic dysfunction, normal LAP. The aortic root is upper normal at 3.8 cm. No significant valvular abnormality. Previous study in 2018 noted mild LA dilatation, aortic root 4.1 cm.   EKG:  03/27/2022: Sinus rhythm with first-degree AV block and a single PVC at a rate of 76 bpm.  Right bundle branch block.  09/27/2021: Sinus rhythm with first-degree AV block at a rate of 75 bpm.  Normal axis.  Right bundle branch block.  Compared to EKG 06/27/2021, no significant change.  05/03/2021: Atrial fibrillation with rapid ventricular response at a rate of 103 bpm.  Normal axis.  Right bundle branch block, secondary ST-T wave changes.  Compared to EKG 03/30/2021, ventricular rate controlled improved otherwise no significant change.  ED EKG 03/25/2021: Atrial flutter with 4-1 AV block at a rate of 128 bpm.  10/12/2020: Sinus rhythm with  first-degree AV block at rate of 82 bpm, normal axis, right bundle branch block.   Assessment:     ICD-10-CM   1. Coronary artery disease involving coronary bypass graft of native heart without angina pectoris  I25.810     2. Chronic heart failure with preserved ejection fraction (HCC)  I50.32       No orders of the defined types were placed in this encounter.   There are no discontinued medications. This patients CHA2DS2-VASc Score 5 (CHF, HTN, vasc, age) and yearly risk of stroke 7.2%.   Recommendations:   [name]  is a 81 y.o. with known coronary artery disease and CABG in 2007 in the setting of non-STEMI in atrial  fibrillation with RVR. He has a history of  45 pack year h/o smoking tobacco, ascending aortic aneurysm (follows Dr.Bryan Bartle), small abdominal aortic aneurysm noted in 2015 (3cm), H/O colon cancer, in remission since 1992, GERD, hyperlipidemia, obstructive sleep apnea now on CPAP.   Patient hospitalized 03/2021 with recurrence of atrial flutter and echocardiogram revealed new onset  LVEF 35-40% with global hypokinesis. Patient underwent successful cardioversion 05/23/2021 with return to normal sinus rhythm.  TEE prior to cardioversion noted ASD, which was subsequently repaired by Dr. Einar Gip 06/13/2021. Due to concerns of bradycardia patient underwent cardiac monitor which revealed a single asymptomatic episode of VT lasting 4 beats, episodes of SVT as well as intermittent Mobitz type I block which was asymptomatic and during sleep hours.  Monitor did reveal frequent PVCs, there were no patient triggered events.  No evidence of atrial fibrillation on the monitor. Patient opted to reduce Lopressor from 50 mg to 25 mg twice daily given soft blood pressure. Repeat echocardiogram in 03/20/2022 revealed LVEF of 18-84%, grade 1 diastolic dysfunction, no significant valvular abnormalities.  Patient presents for urgent visit after an episode of abdominal swelling and shortness of breath 05/31/2022 which completely resolved the next day with use of torsemide.  Patient is presently feeling well overall.  Blood pressure is well controlled and he is euvolemic on exam.  We will continue present medications.  Reiterated the importance of DASH diet.  Patient is educated on additional as needed doses of torsemide.  Keep follow-up appointment as previously scheduled.   Alethia Berthold, PA-C 06/06/2022, 1:52 PM Office: [phone]

## 2022-06-07 DIAGNOSIS — M25532 Pain in left wrist: Secondary | ICD-10-CM | POA: Diagnosis not present

## 2022-06-07 DIAGNOSIS — M65341 Trigger finger, right ring finger: Secondary | ICD-10-CM | POA: Diagnosis not present

## 2022-06-14 ENCOUNTER — Other Ambulatory Visit: Payer: Self-pay | Admitting: Student

## 2022-06-23 IMAGING — MR MR WRIST*L* W/O CM
6 series · 40 of 40 positions shown · non-contrast
Comparison: X-ray wrist 09/29/2021

CLINICAL DATA: Left medial/ulnar wrist pain with swelling for 3
months.

EXAM:
MR OF THE LEFT WRIST WITHOUT CONTRAST
TECHNIQUE: Multiplanar, multisequence MR imaging of the left wrist was
performed. No intravenous contrast was administered.

[Series 4: T2 fat-sat · axial · left · 2.0mm · 0.47mm/px · z∈[-50,+8]mm · 8 of 25 slices shown (1 of 2)]
[im 1/25]
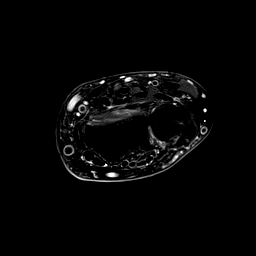
[im 4/25]
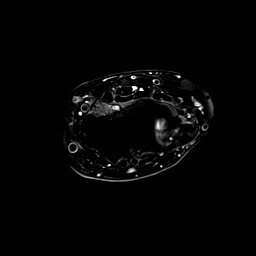
[im 7/25]
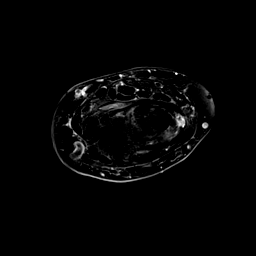
[im 11/25]
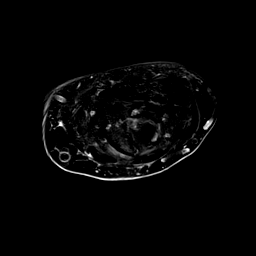
[im 14/25]
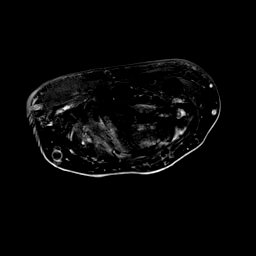
[im 18/25]
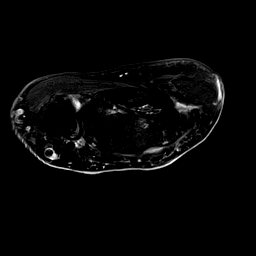
[im 21/25]
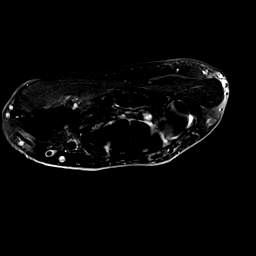
[im 25/25]
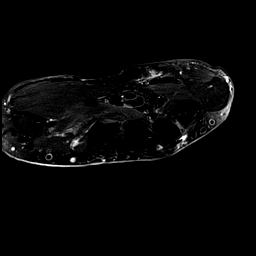

[Series 5: T1 · axial · left · 2.0mm · 0.47mm/px · z∈[-50,+8]mm · 8 of 25 slices shown (1 of 2)]
[im 1/25]
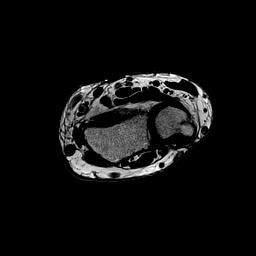
[im 4/25]
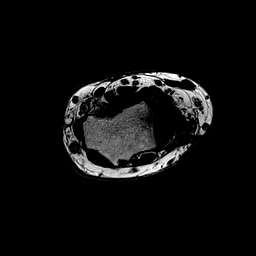
[im 7/25]
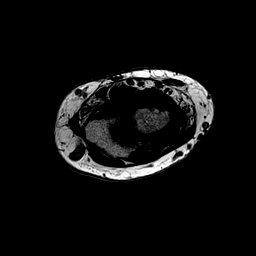
[im 11/25]
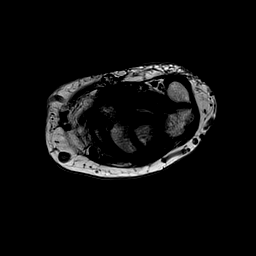
[im 14/25]
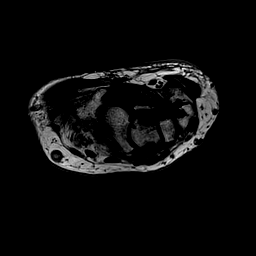
[im 18/25]
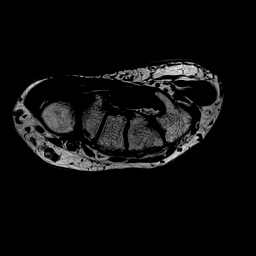
[im 21/25]
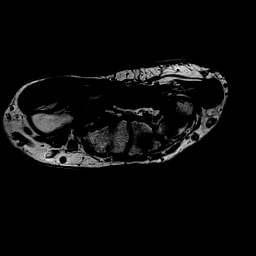
[im 25/25]
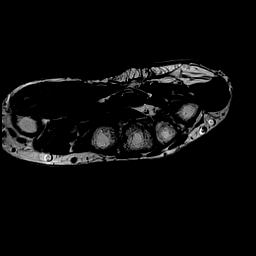

[Series 6: T1 · coronal · left · 3.0mm · 0.39mm/px · 5 of 15 slices shown (2 of 2)]
[im 1/15]
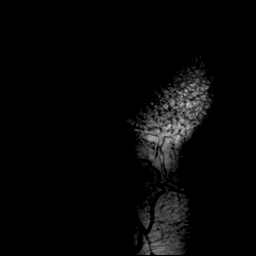
[im 4/15]
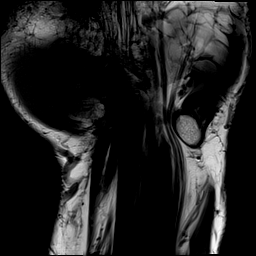
[im 8/15]
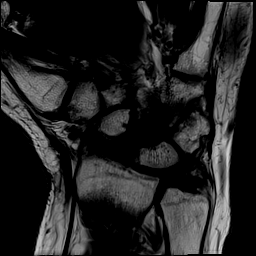
[im 11/15]
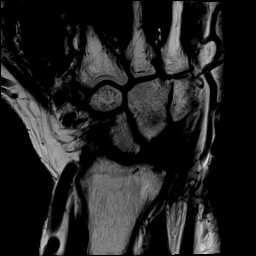
[im 15/15]
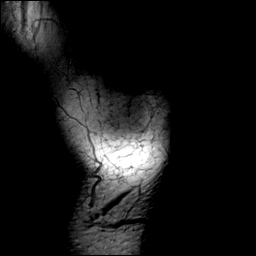

[Series 7: T2 fat-sat · coronal · left · 3.0mm · 0.39mm/px · 5 of 15 slices shown (2 of 2)]
[im 1/15]
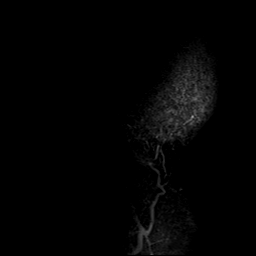
[im 4/15]
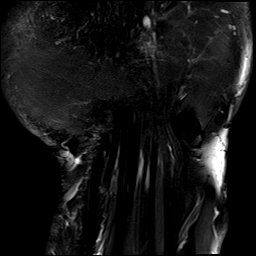
[im 8/15]
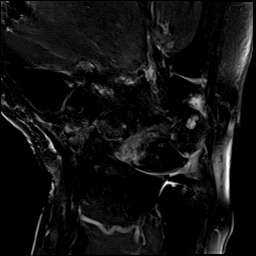
[im 11/15]
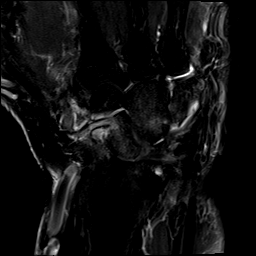
[im 15/15]
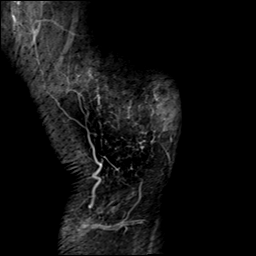

[Series 8: PD fat-sat · coronal · left · 3.0mm · 0.39mm/px · 5 of 15 slices shown (1 of 2)]
[im 1/15]
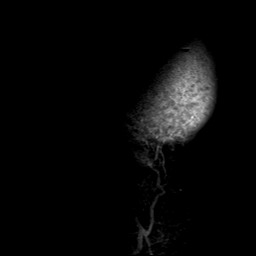
[im 4/15]
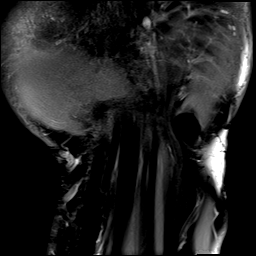
[im 8/15]
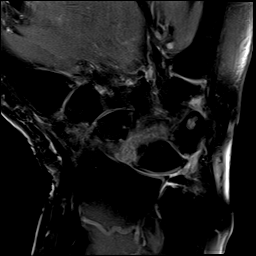
[im 11/15]
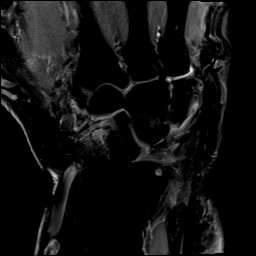
[im 15/15]
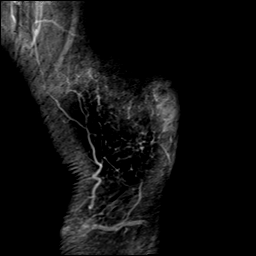

[Series 9: PD fat-sat · sagittal · left · 3.0mm · 0.39mm/px · 9 of 26 slices shown (2 of 2)]
[im 1/26]
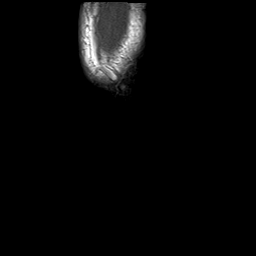
[im 4/26]
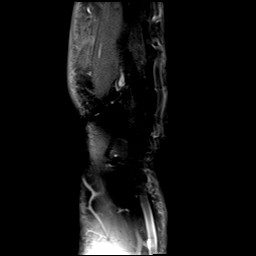
[im 7/26]
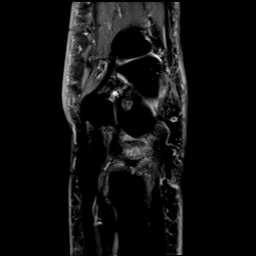
[im 10/26]
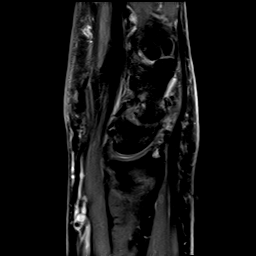
[im 13/26]
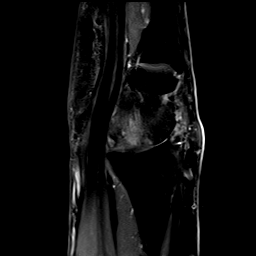
[im 16/26]
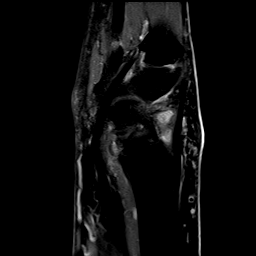
[im 19/26]
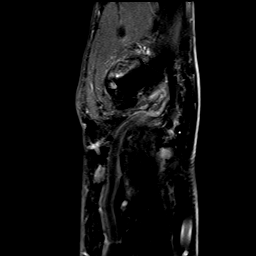
[im 22/26]
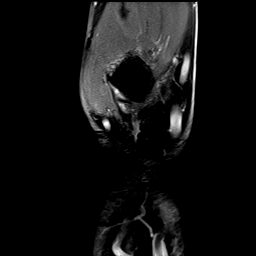
[im 26/26]
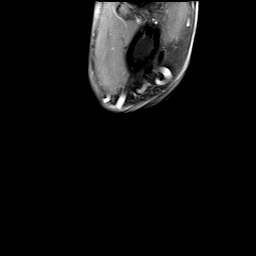

[40 of 40 positions shown; findings below may reference images not displayed]

FINDINGS: Ligaments: Intermediate T2 signal sprain of the scapholunate
ligament. The lunotriquetral ligament is grossly intact.

There is moderate a high-grade intermediate T2 signal and thickening
of the radioscaphoid ligament likely a sprain (coronal series 7,
image 11).

Triangular fibrocartilage: The central radial attachment of the
triangular fibrocartilage is mildly attenuated and there is thin
intermediate T2 signal suggesting a non fluid bright nondisplaced
complete tear (coronal series 7, image 8). Mild ulnar negative
variance. There is moderate attenuation and irregularity of the
ulnar styloid attachment of the peripheral triangular fibrocartilage
complex, likely mild degenerative change/small partial-thickness
tears.

Tendons: The dorsal extensor tendon compartments are intact. The
flexor tendons and flexor retinaculum are intact.

Carpal tunnel/median nerve: Normal carpal tunnel. Normal median
nerve.

Guyon's canal: Normal.  The ulnar nerve is unremarkable.

Joint/cartilage: There is full-thickness cartilage loss at the
distal radius junction with the proximal scaphoid and adjacent
lunate. Moderate subchondral cystic changes at the
scaphoid-capitate-lunate articulations and distal triquetrum.

Moderate cartilage thinning of the triscaphe articulation.

Moderate joint space narrowing, subchondral cystic change and
peripheral osteophytosis of the thumb carpometacarpal joint.

High-grade proximal and distal scaphoid marrow edema.

Bones/carpal alignment: Normal alignment.

Other: None.
IMPRESSION: :
IMPRESSION: 1. Osteoarthritis greatest at the scaphoid-lunate-capitate
articulations as well as the radiocarpal and thumb carpometacarpal
joints.
2. Sprain of the scapholunate ligament.
3. Attenuation and degenerative irregularity of the peripheral
triangular fibrocartilage complex. Possible thin linear tear within
the central radial attachment of the triangular fibrocartilage
complex.

## 2022-07-05 DIAGNOSIS — M25532 Pain in left wrist: Secondary | ICD-10-CM | POA: Diagnosis not present

## 2022-07-05 DIAGNOSIS — M65341 Trigger finger, right ring finger: Secondary | ICD-10-CM | POA: Diagnosis not present

## 2022-07-24 ENCOUNTER — Other Ambulatory Visit: Payer: Self-pay | Admitting: Surgery

## 2022-07-24 DIAGNOSIS — I7121 Aneurysm of the ascending aorta, without rupture: Secondary | ICD-10-CM

## 2022-08-03 DIAGNOSIS — M65341 Trigger finger, right ring finger: Secondary | ICD-10-CM | POA: Diagnosis not present

## 2022-08-03 DIAGNOSIS — M25532 Pain in left wrist: Secondary | ICD-10-CM | POA: Diagnosis not present

## 2022-08-09 ENCOUNTER — Ambulatory Visit: Payer: Self-pay | Admitting: Licensed Clinical Social Worker

## 2022-08-09 DIAGNOSIS — G8929 Other chronic pain: Secondary | ICD-10-CM | POA: Diagnosis not present

## 2022-08-09 DIAGNOSIS — N1831 Chronic kidney disease, stage 3a: Secondary | ICD-10-CM | POA: Diagnosis not present

## 2022-08-09 DIAGNOSIS — I10 Essential (primary) hypertension: Secondary | ICD-10-CM | POA: Diagnosis not present

## 2022-08-09 DIAGNOSIS — I2510 Atherosclerotic heart disease of native coronary artery without angina pectoris: Secondary | ICD-10-CM | POA: Diagnosis not present

## 2022-08-09 DIAGNOSIS — K219 Gastro-esophageal reflux disease without esophagitis: Secondary | ICD-10-CM | POA: Diagnosis not present

## 2022-08-09 DIAGNOSIS — E7800 Pure hypercholesterolemia, unspecified: Secondary | ICD-10-CM | POA: Diagnosis not present

## 2022-08-09 DIAGNOSIS — G259 Extrapyramidal and movement disorder, unspecified: Secondary | ICD-10-CM | POA: Diagnosis not present

## 2022-08-09 DIAGNOSIS — I4819 Other persistent atrial fibrillation: Secondary | ICD-10-CM | POA: Diagnosis not present

## 2022-08-09 DIAGNOSIS — R7303 Prediabetes: Secondary | ICD-10-CM | POA: Diagnosis not present

## 2022-08-09 NOTE — Patient Instructions (Signed)
Visit Information  Instructions:   Patient was given the following information about care management and care coordination services today, agreed to services, and gave verbal consent: 1.care management/care coordination services include personalized support from designated clinical staff supervised by their physician, including individualized plan of care and coordination with other care providers 2. 24/7 contact phone numbers for assistance for urgent and routine care needs. 3. The patient may stop care management/care coordination services at any time by phone call to the office staff.  Patient verbalizes understanding of instructions and care plan provided today and agrees to view in MyChart. Active MyChart status and patient understanding of how to access instructions and care plan via MyChart confirmed with patient.     No further follow up required: .   , BSW , MSW Social Worker [facility]  [phone]      

## 2022-08-09 NOTE — Patient Outreach (Signed)
  Care Coordination   Initial Visit Note   08/09/2022 Name: BROCK LARMON MRN: [id number] DOB: [birth year]  Linton Ham Commerford is a 81 y.o. year old male who sees Shirline Frees, MD for primary care. I spoke with  Linton Ham Fulgham by phone today  What matters to the patients health and wellness today?  Patient has appointment with PCP on today. No additional needs at this time.    Goals Addressed               This Visit's Progress     MSW Care Coordination (pt-stated)        Patient has no needs at this time. Patient stated he has an appointment with PCP on today. Patient has SW contact information if needed in the future.        SDOH assessments and interventions completed:  Yes     Care Coordination Interventions Activated:  Yes  Care Coordination Interventions:  Yes, provided   Follow up plan: No further intervention required.   Encounter Outcome:  Pt. Visit Completed   Lenor Derrick, MSW  Social Worker [facility]  [phone]

## 2022-09-17 ENCOUNTER — Ambulatory Visit: Payer: Medicare PPO | Admitting: Physician Assistant

## 2022-09-17 ENCOUNTER — Ambulatory Visit
Admission: RE | Admit: 2022-09-17 | Discharge: 2022-09-17 | Disposition: A | Discharge status: Home / Self Care | Payer: Medicare PPO | Source: Ambulatory Visit | Attending: Surgery | Admitting: Surgery

## 2022-09-17 VITALS — BP 135/84 | HR 87 | Resp 20 | Ht 71.0 in | Wt 242.3 lb

## 2022-09-17 DIAGNOSIS — I7121 Aneurysm of the ascending aorta, without rupture: Secondary | ICD-10-CM | POA: Diagnosis not present

## 2022-09-17 DIAGNOSIS — J8410 Pulmonary fibrosis, unspecified: Secondary | ICD-10-CM | POA: Diagnosis not present

## 2022-09-17 DIAGNOSIS — K760 Fatty (change of) liver, not elsewhere classified: Secondary | ICD-10-CM | POA: Diagnosis not present

## 2022-09-17 DIAGNOSIS — I712 Thoracic aortic aneurysm, without rupture, unspecified: Secondary | ICD-10-CM | POA: Diagnosis not present

## 2022-09-17 DIAGNOSIS — I2510 Atherosclerotic heart disease of native coronary artery without angina pectoris: Secondary | ICD-10-CM | POA: Diagnosis not present

## 2022-09-17 MED ORDER — IOPAMIDOL (ISOVUE-370) INJECTION 76%
75.0000 mL | Freq: Once | INTRAVENOUS | Status: AC | PRN
  Administered 2022-09-17: 75 mL via INTRAVENOUS

## 2022-09-17 NOTE — Progress Notes (Signed)
[address]       [place] [address]             [phone]       HPI: [name] is an 81 year old gentleman with history of hypertension, atrial fibrillation, bundle branch block, coronary artery disease status post CABG, history of combined systolic and diastolic heart failure, history of DVT following CABG, and stage IIIa chronic kidney disease.  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  This lesion has been stable since that time.  He returns today for scheduled follow-up follow-up with CTA chest. [name] reports no changes in his health since his last visit.  He denies any chest pain.  He does have some shortness of breath which is chronic.   Current Outpatient Medications  Medication Sig Dispense Refill   albuterol (PROVENTIL) (2.5 MG/3ML) 0.083% nebulizer solution USE 1 VIAL IN NEBULIZER EVERY 6 HOURS AS NEEDED FOR WHEEZING OR SHORTNESS OF BREATH 75 mL 3   albuterol (VENTOLIN HFA) 108 (90 Base) MCG/ACT inhaler INHALE 2 PUFFS BY MOUTH EVERY 6 HOURS AS NEEDED FOR WHEEZING OR SHORTNESS OF BREATH 9 g 1   atorvastatin (LIPITOR) 20 MG tablet Take 20 mg by mouth at bedtime.   5   ciclopirox (PENLAC) 8 % solution Apply topically at bedtime. Apply over nail and surrounding skin. Apply daily over previous coat. After seven (7) days, may remove with alcohol and continue cycle. 6.6 mL 2   clonazePAM (KLONOPIN) 1 MG tablet Take 1 mg by mouth at bedtime as needed (sleep).     ELIQUIS 5 MG TABS tablet Take 1 tablet by mouth twice daily 180 tablet 0   fluticasone (FLONASE) 50 MCG/ACT nasal spray Place 2 sprays into both nostrils daily. 16 g 2   gabapentin (NEURONTIN) 100 MG capsule Take 100-200 mg by mouth See admin instructions. Take 200 mg am & 100 mg at bedtime     guaiFENesin (MUCINEX) 600 MG 12 hr tablet Take 600 mg by mouth daily as needed (bronchitis).     HYDROcodone-acetaminophen  (NORCO/VICODIN) 5-325 MG tablet Take 1 tablet by mouth every 6 (six) hours as needed for moderate pain.     isosorbide mononitrate (IMDUR) 30 MG 24 hr tablet Take 1 tablet by mouth once daily 90 tablet 1   Magnesium 200 MG TABS Take 200 mg by mouth in the morning.     metoprolol tartrate (LOPRESSOR) 25 MG tablet Take 1 tablet (25 mg total) by mouth 2 (two) times daily. 180 tablet 3   Multiple Vitamin (MULTIVITAMIN WITH MINERALS) TABS tablet Take 1 tablet by mouth in the morning.     nitroGLYCERIN (NITROSTAT) 0.4 MG SL tablet Place 1 tablet (0.4 mg total) under the tongue every 5 (five) minutes as needed for chest pain. 25 tablet 3   Omega-3 Fatty Acids (FISH OIL) 1000 MG CAPS Take 1,000 mg by mouth in the morning.     omeprazole (PRILOSEC) 20 MG capsule Take 1 capsule (20 mg total) by mouth daily. 30 capsule 3   Phenylephrine-DM-GG (ROBITUSSIN COUGH/COLD CF MAX PO) Take 20 mLs by mouth every 6 (six) hours as needed (cough).     Potassium Chloride ER 20 MEQ TBCR Take 20 mEq by mouth in the morning.     STIOLTO RESPIMAT 2.5-2.5 MCG/ACT AERS INHALE 2 PUFFS BY MOUTH ONCE DAILY 4 g 5   tamsulosin (FLOMAX) 0.4 MG  CAPS capsule Take 0.8 mg by mouth at bedtime.     tiZANidine (ZANAFLEX) 4 MG tablet Take 4 mg by mouth at bedtime as needed for muscle spasms.     torsemide (DEMADEX) 20 MG tablet Take 40 mg by mouth in the morning.     No current facility-administered medications for this visit.    Physical Exam: Vital signs BP 139/84 Pulse 87 Respirations 20 SPO2 93% on room air  General: Well appearing 81 year old gentleman in no distress.  He walks with a steady gait. Neck: No JVD, no carotid bruits Heart: Regular rate and rhythm, no murmur. Chest: Breath sounds are clear to auscultation.  There is a well-healed sternotomy incision. Extremities: Well perfused, radial pulses are equal.  No peripheral edema.   Diagnostic Tests: CLINICAL DATA:  Follow-up of thoracic aortic aneurysm   EXAM: CT  ANGIOGRAPHY CHEST WITH CONTRAST   TECHNIQUE: Multidetector CT imaging of the chest was performed using the standard protocol during bolus administration of intravenous contrast. Multiplanar CT image reconstructions and MIPs were obtained to evaluate the vascular anatomy.   RADIATION DOSE REDUCTION: This exam was performed according to the departmental dose-optimization program which includes automated exposure control, adjustment of the mA and/or kV according to patient size and/or use of iterative reconstruction technique.   CONTRAST:  64m ISOVUE-370 IOPAMIDOL (ISOVUE-370) INJECTION 76%   COMPARISON:  Previous studies including the examination of 09/14/2021   FINDINGS: Cardiovascular: There is homogeneous enhancement in thoracic aorta. There is ectasia of ascending thoracic aorta measuring up to 4.4 cm. This finding has not changed. Coronary artery calcifications are seen. There is previous coronary artery bypass surgery. There are no intraluminal filling defects in pulmonary artery branches.   Mediastinum/Nodes: There are slightly enlarged lymph nodes in mediastinum with no significant interval change. There is inhomogeneous attenuation in thyroid.   Lungs/Pleura: There is no focal pulmonary consolidation. In image 78 of series 6, there is 6 mm nodule in the anterior right mid lung fields inseparable from minor fissure, possibly a pleural plaque or subpleural lymph node. This finding appears stable. There are no new nodules in the lung fields. There are few calcified granulomas in right upper lobe. There is no pleural effusion or pneumothorax.   Upper Abdomen: There is fatty infiltration in liver. Surgical clips are seen in gallbladder fossa.   Musculoskeletal: No acute findings are seen.   Review of the MIP images confirms the above findings.   IMPRESSION: There is no evidence of thoracic aortic dissection. There is 4.4 cm aneurysm in the ascending thoracic aorta  which has not changed in comparison with previous study done on 09/14/2021.   There is no evidence of pulmonary artery embolism. Coronary artery disease. There is no focal pulmonary consolidation. There is a 6 mm nodule inseparable from minor fissure in right lung, possibly a granuloma or pleural plaque. This finding appears stable.   Fatty liver.   Other findings as described in the body of the report.     Electronically Signed   By: PElmer PickerM.D.   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  This lesion has been stable since initially described in 2019.  Additionally, there is a 6 mm nodule in the minor fissure of the right lung as noted in the report above.  This lesion is also stable.  No surgical intervention is indicated but we will continue to monitor with annual CTA.  He  understands the importance of continued careful management of his blood pressure and avoiding prolonged or repetitive strenuous activity.   Antony Odea, PA-C [facility] [phone]

## 2022-09-17 NOTE — Patient Instructions (Addendum)
Continue careful blood pressure control and monitoring.  Avoid prolonged or repetitive strenuous activity such as lifting more than 30 pounds.  Follow-up in 1 year with CTA chest to reevaluate the thoracic aortic aneurysm and right pulmonary nodule

## 2022-09-26 ENCOUNTER — Other Ambulatory Visit: Payer: Self-pay

## 2022-09-26 MED ORDER — APIXABAN 5 MG PO TABS: 5.0000 mg | ORAL_TABLET | Freq: Two times a day (BID) | ORAL | 2 refills | Status: DC

## 2022-09-27 ENCOUNTER — Ambulatory Visit: Payer: Medicare PPO

## 2022-09-27 ENCOUNTER — Ambulatory Visit: Payer: Medicare PPO | Admitting: Student

## 2022-09-27 VITALS — BP 114/75 | HR 86 | Temp 98.2°F | Resp 16 | Ht 71.0 in | Wt 239.0 lb

## 2022-09-27 DIAGNOSIS — I5032 Chronic diastolic (congestive) heart failure: Secondary | ICD-10-CM | POA: Diagnosis not present

## 2022-09-27 DIAGNOSIS — I25810 Atherosclerosis of coronary artery bypass graft(s) without angina pectoris: Secondary | ICD-10-CM | POA: Diagnosis not present

## 2022-09-27 DIAGNOSIS — I480 Paroxysmal atrial fibrillation: Secondary | ICD-10-CM | POA: Diagnosis not present

## 2022-09-27 DIAGNOSIS — I739 Peripheral vascular disease, unspecified: Secondary | ICD-10-CM

## 2022-09-27 NOTE — Progress Notes (Signed)
Primary Physician:  Shirline Frees, MD   Patient ID: [name], male    DOB: 05-08-1941, 81 y.o.   MRN: [id number]  Subjective:    Chief Complaint  Patient presents with   Coronary Artery Disease   PAD   Follow-up    6 month    HPI: [name]  is a 81 y.o. male  with known coronary artery disease and CABG in 2007 in the setting of non-STEMI in atrial fibrillation with RVR. He has a history of  45 pack year h/o smoking tobacco, ascending aortic aneurysm (follows Dr.Bryan Bartle), small abdominal aortic aneurysm noted in 2015 (3cm), H/O colon cancer, in remission since 1992, GERD, hyperlipidemia, obstructive sleep apnea now on CPAP.   Patient hospitalized 03/2021 with recurrence of atrial flutter and echocardiogram revealed new onset LVEF 35-40% with global hypokinesis. Patient underwent successful cardioversion 05/23/2021 with return to normal sinus rhythm.  TEE prior to cardioversion noted ASD, which was subsequently repaired by Dr. Einar Gip 06/13/2021. Due to concerns of bradycardia patient underwent cardiac monitor which revealed a single asymptomatic episode of VT lasting 4 beats, episodes of SVT as well as intermittent Mobitz type I block which was asymptomatic and during sleep hours.  Monitor did reveal frequent PVCs, there were no patient triggered events.  No evidence of atrial fibrillation on the monitor. Patient opted to reduce Lopressor from 50 mg to 25 mg twice daily given soft blood pressure. Repeat echocardiogram in 03/20/2022 revealed LVEF of 93-23%, grade 1 diastolic dysfunction, no significant valvular abnormalities.  Patient presents today for 6 month follow-up.  He is currently feeling well overall with no recurrence of swelling or shortness of breath. He does endorse bilateral lower leg numbness, cramping, tingling, pain, and occasional swelling.  Theses symptoms have worsened over the past 6 months. Pain, numbness, and tingling occur at rest and with exertion. Denies  chest pain, orthopnea, PND, leg edema.  Past Medical History:  Diagnosis Date   Allergy    Arthritis    BPH (benign prostatic hyperplasia)    CAD (coronary artery disease)    s/p CABG 2007; NSTEMI in setting of AFib with RVR 12/2009; cath 1/11: S-RCA ok with prox 30-40% and 50% stenoses; S-OM and RI  with patent OM limb but an occluded RI limb; S-D2 and L-LAD ok; EF 55%   Cataract    removed both eyes    Clotting disorder (HCC)    s/p CABG x 5- PE    Colon cancer ([place]) 1992   COPD (chronic obstructive pulmonary disease) ([place])    Diverticulosis    Essential hypertension    Family history of breast cancer    Family history of colon cancer    Family history of lung cancer    GERD (gastroesophageal reflux disease)    History of pulmonary embolus (PE) 2007   Following CABG   Hyperlipidemia    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[place])    Right upper   OSA on CPAP    Paroxysmal atrial fibrillation ([place])    Previously on Multaq, declines anticoagulation   Personal history of colonic polyps 01/22/2000   Tubular adenoma   RBBB (right bundle branch block)    Sleep apnea    no cpap    Squamous cell carcinoma of skin 02/11/2008   [place]-Left paraspinal, lower    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   in situ-left forearm   Past Surgical History:  Procedure Laterality Date   CARDIOVERSION N/A 05/23/2021   Procedure:  CARDIOVERSION;  Surgeon: Nigel Mormon, MD;  Location: [facility];  Service: Cardiovascular;  Laterality: N/A;   COLECTOMY  1992   w/ colostomy  related to colon cancer   [place]   to take down Colostomy   COLONOSCOPY     CORONARY ARTERY BYPASS GRAFT  2007   CORONARY STENT INTERVENTION N/A 10/07/2018   Procedure: CORONARY STENT INTERVENTION;  Surgeon: Adrian Prows, MD;  Location: [place] [facility];  Service: Cardiovascular;  Laterality: N/A;   HAND [place]   tendon repair and nerve; left   INGUINAL HERNIA REPAIR  2009   LAPAROSCOPIC  CHOLECYSTECTOMY  2004   PATENT FORAMEN OVALE(PFO) CLOSURE N/A 06/13/2021   Procedure: PATENT FORAMEN OVALE (PFO) CLOSURE;  Surgeon: Adrian Prows, MD;  Location: [place] [facility];  Service: Cardiovascular;  Laterality: N/A;   POLYPECTOMY     RIGHT HEART CATH N/A 06/13/2021   Procedure: RIGHT HEART CATH;  Surgeon: Adrian Prows, MD;  Location: [facility];  Service: Cardiovascular;  Laterality: N/A;   RIGHT/LEFT HEART CATH AND CORONARY/GRAFT ANGIOGRAPHY N/A 07/03/2017   Procedure: Right/Left Heart Cath and Coronary/Graft Angiography;  Surgeon: Leonie Man, MD;  Location: [place] [facility];  Service: Cardiovascular;  Laterality: N/A;   RIGHT/LEFT HEART CATH AND CORONARY/GRAFT ANGIOGRAPHY N/A 10/07/2018   Procedure: RIGHT/LEFT HEART CATH AND CORONARY/GRAFT ANGIOGRAPHY;  Surgeon: Adrian Prows, MD;  Location: [place] [facility];  Service: Cardiovascular;  Laterality: N/A;   TEE WITHOUT CARDIOVERSION  05/23/2021   Procedure: TRANSESOPHAGEAL ECHOCARDIOGRAM (TEE);  Surgeon: Nigel Mormon, MD;  Location: [facility];  Service: Cardiovascular;;   UMBILICAL HERNIA REPAIR     VENTRAL HERNIA REPAIR  2009   Family History  Problem Relation Age of Onset   Lung cancer Mother    Heart disease Mother    Emphysema Father    Heart disease Father    Cancer Sister 84       unknown type   Breast cancer Sister        dx. >50   Colon cancer Sister        dx. >50   Breast cancer Sister        dx. >50   Colon cancer Sister        dx. >50   Colon cancer Cousin        dx. early 25s   Esophageal cancer Neg Hx    Rectal cancer Neg Hx    Stomach cancer Neg Hx    Colon polyps Neg Hx    Social History   Tobacco Use   Smoking status: Former    Packs/day: 1.00    Years: 45.00    Total pack years: 45.00    Types: Cigarettes    Start date: 1955    Quit date: 12/18/1995    Years since quitting: 26.7   Smokeless tobacco: Never  Substance Use Topics   Alcohol use: No    Comment: quit in 1980    Marital Status: Married  ROS   Review of Systems  Cardiovascular:  Positive for claudication and leg swelling (intermittent). Negative for chest pain, dyspnea on exertion, near-syncope, orthopnea, palpitations, paroxysmal nocturnal dyspnea and syncope.  Respiratory:  Negative for shortness of breath.   Gastrointestinal:  Negative for melena.    Objective:  Blood pressure 114/75, pulse 86, temperature 98.2 F (36.8 C), temperature source Temporal, resp. rate 16, height '5\' 11"'$  (1.803 m), weight 239 lb (108.4 kg),  SpO2 94 %. Body mass index is 33.33 kg/m.     09/27/2022    2:22 PM 09/17/2022    1:23 PM 06/06/2022    1:37 PM  Vitals with BMI  Height '5\' 11"'$  '5\' 11"'$  '5\' 11"'$   Weight 239 lbs 242 lbs 5 oz 242 lbs  BMI 33.35 29.47 65.46  Systolic 503 546 568  Diastolic 75 84 67  Pulse 86 87 79   Physical Exam Vitals reviewed.  Constitutional:      Appearance: He is well-developed.  Neck:     Vascular: No carotid bruit.  Cardiovascular:     Rate and Rhythm: Normal rate and regular rhythm. No extrasystoles are present.    Pulses: Intact distal pulses.          Carotid pulses are 2+ on the right side and 2+ on the left side.      Femoral pulses are 2+ on the right side and 2+ on the left side.      Popliteal pulses are 0 on the right side and 0 on the left side.       Dorsalis pedis pulses are 0 on the right side and 0 on the left side.       Posterior tibial pulses are 0 on the right side and 0 on the left side.     Heart sounds: Normal heart sounds, S1 normal and S2 normal. No murmur heard.    No gallop.  Pulmonary:     Effort: Pulmonary effort is normal. No accessory muscle usage.     Breath sounds: Normal breath sounds.  Abdominal:     General: Bowel sounds are normal. There is no distension.     Palpations: Abdomen is soft.  Musculoskeletal:     Right lower leg: No edema.     Left lower leg: No edema.  Skin:    Comments: BLE pale and cool to touch    Laboratory  examination:      Latest Ref Rng & Units 10/11/2021    1:32 PM 06/13/2021    8:54 AM 06/13/2021    8:39 AM  CMP  Glucose 70 - 99 mg/dL 113     BUN 8 - 27 mg/dL 12     Creatinine 0.76 - 1.27 mg/dL 1.32     Sodium 134 - 144 mmol/L 140  140  141   Potassium 3.5 - 5.2 mmol/L 4.1  3.8  3.8   Chloride 96 - 106 mmol/L 102     CO2 20 - 29 mmol/L 22     Calcium 8.6 - 10.2 mg/dL 9.2         Latest Ref Rng & Units 06/13/2021    8:54 AM 06/13/2021    8:39 AM 06/13/2021    8:32 AM  CBC  Hemoglobin 13.0 - 17.0 g/dL 13.9  13.9  13.6   Hematocrit 39.0 - 52.0 % 41.0  41.0  40.0    Lipid Panel     Component Value Date/Time   CHOL 134 09/08/2015 1210   TRIG 211.0 (H) 09/08/2015 1210   HDL 36.10 (L) 09/08/2015 1210   CHOLHDL 4 09/08/2015 1210   VLDL 42.2 (H) 09/08/2015 1210   LDLCALC 59 07/23/2014 1638   LDLDIRECT 87.0 09/08/2015 1210   HEMOGLOBIN A1C No results found for: "HGBA1C", "MPG" TSH No results for input(s): "TSH" in the last 8760 hours.   External Labs: 08/07/2021: HDL 36, LDL 49, total cholesterol 109, triglycerides 134 A1c 6.4% BUN 15, creatinine 1.3,  GFR 55 TSH 0.63  Cholesterol, total 128.000 m 06/22/2020 HDL 34.000 mg 06/22/2020 LDL-C 63.000 mg 06/22/2020 Triglycerides 183.000 m 06/22/2020  Hemoglobin 14.900 g/d 06/22/2020  Creatinine, Serum 1.250 mg/ 06/22/2020 Potassium 3.900 mm 06/22/2020 ALT (SGPT) 30.000 U/L 06/22/2020  TSH 2.120 06/22/2020   Allergies   Allergies  Allergen Reactions   Morphine Sulfate Itching   Rosuvastatin Calcium Other (See Comments)    Muscle weakness   Tramadol Itching    Other reaction(s): Unknown    Medications Prior to Visit:   Outpatient Medications Prior to Visit  Medication Sig Dispense Refill   albuterol (PROVENTIL) (2.5 MG/3ML) 0.083% nebulizer solution USE 1 VIAL IN NEBULIZER EVERY 6 HOURS AS NEEDED FOR WHEEZING OR SHORTNESS OF BREATH 75 mL 3   albuterol (VENTOLIN HFA) 108 (90 Base) MCG/ACT inhaler INHALE 2 PUFFS BY MOUTH EVERY 6  HOURS AS NEEDED FOR WHEEZING OR SHORTNESS OF BREATH 9 g 1   apixaban (ELIQUIS) 5 MG TABS tablet Take 1 tablet (5 mg total) by mouth 2 (two) times daily. 180 tablet 2   atorvastatin (LIPITOR) 20 MG tablet Take 20 mg by mouth at bedtime.   5   ciclopirox (PENLAC) 8 % solution Apply topically at bedtime. Apply over nail and surrounding skin. Apply daily over previous coat. After seven (7) days, may remove with alcohol and continue cycle. 6.6 mL 2   clonazePAM (KLONOPIN) 1 MG tablet Take 1 mg by mouth at bedtime as needed (sleep).     fluticasone (FLONASE) 50 MCG/ACT nasal spray Place 2 sprays into both nostrils daily. 16 g 2   gabapentin (NEURONTIN) 100 MG capsule Take 100-200 mg by mouth See admin instructions. Take 200 mg am & 100 mg at bedtime     guaiFENesin (MUCINEX) 600 MG 12 hr tablet Take 600 mg by mouth daily as needed (bronchitis).     HYDROcodone-acetaminophen (NORCO/VICODIN) 5-325 MG tablet Take 1 tablet by mouth every 6 (six) hours as needed for moderate pain.     isosorbide mononitrate (IMDUR) 30 MG 24 hr tablet Take 1 tablet by mouth once daily 90 tablet 1   Magnesium 200 MG TABS Take 200 mg by mouth in the morning.     metoprolol tartrate (LOPRESSOR) 25 MG tablet Take 1 tablet (25 mg total) by mouth 2 (two) times daily. 180 tablet 3   Multiple Vitamin (MULTIVITAMIN WITH MINERALS) TABS tablet Take 1 tablet by mouth in the morning.     nitroGLYCERIN (NITROSTAT) 0.4 MG SL tablet Place 1 tablet (0.4 mg total) under the tongue every 5 (five) minutes as needed for chest pain. 25 tablet 3   Omega-3 Fatty Acids (FISH OIL) 1000 MG CAPS Take 1,000 mg by mouth in the morning.     omeprazole (PRILOSEC) 20 MG capsule Take 1 capsule (20 mg total) by mouth daily. 30 capsule 3   Phenylephrine-DM-GG (ROBITUSSIN COUGH/COLD CF MAX PO) Take 20 mLs by mouth every 6 (six) hours as needed (cough).     Potassium Chloride ER 20 MEQ TBCR Take 20 mEq by mouth in the morning.     STIOLTO RESPIMAT 2.5-2.5 MCG/ACT  AERS INHALE 2 PUFFS BY MOUTH ONCE DAILY 4 g 5   tamsulosin (FLOMAX) 0.4 MG CAPS capsule Take 0.8 mg by mouth at bedtime.     tiZANidine (ZANAFLEX) 4 MG tablet Take 4 mg by mouth at bedtime as needed for muscle spasms.     torsemide (DEMADEX) 20 MG tablet Take 40 mg by mouth in the morning.  No facility-administered medications prior to visit.   Final Medications at End of Visit    Current Meds  Medication Sig   albuterol (PROVENTIL) (2.5 MG/3ML) 0.083% nebulizer solution USE 1 VIAL IN NEBULIZER EVERY 6 HOURS AS NEEDED FOR WHEEZING OR SHORTNESS OF BREATH   albuterol (VENTOLIN HFA) 108 (90 Base) MCG/ACT inhaler INHALE 2 PUFFS BY MOUTH EVERY 6 HOURS AS NEEDED FOR WHEEZING OR SHORTNESS OF BREATH   apixaban (ELIQUIS) 5 MG TABS tablet Take 1 tablet (5 mg total) by mouth 2 (two) times daily.   atorvastatin (LIPITOR) 20 MG tablet Take 20 mg by mouth at bedtime.    ciclopirox (PENLAC) 8 % solution Apply topically at bedtime. Apply over nail and surrounding skin. Apply daily over previous coat. After seven (7) days, may remove with alcohol and continue cycle.   clonazePAM (KLONOPIN) 1 MG tablet Take 1 mg by mouth at bedtime as needed (sleep).   fluticasone (FLONASE) 50 MCG/ACT nasal spray Place 2 sprays into both nostrils daily.   gabapentin (NEURONTIN) 100 MG capsule Take 100-200 mg by mouth See admin instructions. Take 200 mg am & 100 mg at bedtime   guaiFENesin (MUCINEX) 600 MG 12 hr tablet Take 600 mg by mouth daily as needed (bronchitis).   HYDROcodone-acetaminophen (NORCO/VICODIN) 5-325 MG tablet Take 1 tablet by mouth every 6 (six) hours as needed for moderate pain.   isosorbide mononitrate (IMDUR) 30 MG 24 hr tablet Take 1 tablet by mouth once daily   Magnesium 200 MG TABS Take 200 mg by mouth in the morning.   metoprolol tartrate (LOPRESSOR) 25 MG tablet Take 1 tablet (25 mg total) by mouth 2 (two) times daily.   Multiple Vitamin (MULTIVITAMIN WITH MINERALS) TABS tablet Take 1 tablet by  mouth in the morning.   nitroGLYCERIN (NITROSTAT) 0.4 MG SL tablet Place 1 tablet (0.4 mg total) under the tongue every 5 (five) minutes as needed for chest pain.   Omega-3 Fatty Acids (FISH OIL) 1000 MG CAPS Take 1,000 mg by mouth in the morning.   omeprazole (PRILOSEC) 20 MG capsule Take 1 capsule (20 mg total) by mouth daily.   Phenylephrine-DM-GG (ROBITUSSIN COUGH/COLD CF MAX PO) Take 20 mLs by mouth every 6 (six) hours as needed (cough).   Potassium Chloride ER 20 MEQ TBCR Take 20 mEq by mouth in the morning.   STIOLTO RESPIMAT 2.5-2.5 MCG/ACT AERS INHALE 2 PUFFS BY MOUTH ONCE DAILY   tamsulosin (FLOMAX) 0.4 MG CAPS capsule Take 0.8 mg by mouth at bedtime.   tiZANidine (ZANAFLEX) 4 MG tablet Take 4 mg by mouth at bedtime as needed for muscle spasms.   torsemide (DEMADEX) 20 MG tablet Take 40 mg by mouth in the morning.   Radiology    Chest x-ray 03/25/2021: 1. Low volume film with cardiomegaly and vascular congestion. 2. Patchy airspace disease in the left base compatible with atelectasis or pneumonia. 3. Tiny nodular density peripheral right mid lung is stable, suggesting benign etiology.  CTA of chest 09/16/2019:  4.4 cm ascending thoracic aortic aneurysm. Recommend annual imaging followup by CTA or MRA. This recommendation follows 2010 ACCF/AHA/AATS/ACR/ASA/SCA/SCAI/SIR/STS/SVM Guidelines for the Diagnosis and Management of Patients with Thoracic Aortic Disease. Circulation. 2010; 121: S341-D622. Aortic aneurysm NOS (ICD10-I71.9)   Old granulomatous disease. No acute cardiopulmonary disease. Aortic Atherosclerosis (ICD10-I70.0).  Cardiac Studies:   CABG 2007: LIMA to LAD, SVG to D2, SVG to OM1 and ramus intermediate with OM1 limb occluded, SVG to RCA with a proximal 50% stenosis by coronary angiography in  July 2018. Normal LVEF.  Lexiscan sestamibi stress test 12/03/2016: No significant ST-T wave changes during stress test. Medium defect of moderate intensity in the basal  inferior, mid inferoseptal, mid inferior and mid inferolateral and apical inferior and apical lateral location consistent with Myocardial infarction mild degree of peri-infarct ischemia. LVEF 52%. Intermediate risk study.  Coronary angiogram 10/08/2018 and had a very high-grade stenosis of a large hiatal D1/ramus branch S/P 3.0 x 23 mm Sierra Xience DES. Patent LIMA to LAD, SVG to D2 and SVG to RCA widely patent. Normal right heart catheterization, preserved cardiac output and cardiac index.  TEE 05/23/2021:  1. Left ventricular ejection fraction, by estimation, is 35 to 40%. The left ventricle has moderately decreased function. The left ventricle demonstrates global hypokinesis.   2. Right ventricular systolic function is moderately reduced. The right ventricular size is moderately enlarged.   3. Left atrial size was severely dilated. No left atrial/left atrial appendage thrombus was detected.   4. Right atrial size was moderately dilated.   5. The mitral valve is grossly normal. Mild mitral valve regurgitation.   6. The aortic valve is tricuspid. Aortic valve regurgitation is not visualized.   7. Evidence of atrial level shunting detected by color flow Doppler. Septum primum, as well as septum secundum defects seen. 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 Multiple ASD's are noted.   8. No prior TEE for comparison.   Ambulatory cardiac telemetry 3 days (12/28/2021 - 01/01/2020): Predominant underlying rhythm was sinus with first-degree AV block and bundle branch block.  Patient with single asymptomatic episode of VT lasting 4 beats.  6 episodes of SVT with the longest lasting 15 beats, which were asymptomatic.  Episodes of Mobitz type I AV block, asymptomatic and during sleep hours.  Rare PACs with frequent PVCs, both ventricular bigeminy and trigeminy were present.  There were no patient triggered events.  No evidence of pauses >3 seconds, high  degree AV block, or atrial fibrillation. PVC burden 5.7%  PCV ECHOCARDIOGRAM COMPLETE 03/20/2022 Left ventricle cavity is normal in size. Moderate concentric hypertrophy of the left ventricle. Normal global wall motion. Normal LV systolic function with visual EF 50-55%. Doppler evidence of grade I (impaired) diastolic dysfunction, normal LAP. The aortic root is upper normal at 3.8 cm. No significant valvular abnormality. Previous study in 2018 noted mild LA dilatation, aortic root 4.1 cm.   EKG:   EKG 09/27/2022: Sinus rhythm with first-degree AV block at rate of 74 bpm.  Normal axis.  Right branch block.  Compared to previous EKG of 03/27/22, no significant change.  Assessment:     ICD-10-CM   1. Coronary artery disease involving coronary bypass graft of native heart without angina pectoris  I25.810 EKG 12-Lead    2. Chronic heart failure with preserved ejection fraction (HCC)  I50.32     3. Paroxysmal atrial fibrillation (HCC)  I48.0     4. Claudication of both lower extremities (HCC)  I73.9 PCV LOWER ARTERIAL (BILATERAL)      No orders of the defined types were placed in this encounter.  = There are no discontinued medications. This patients CHA2DS2-VASc Score 5 (CHF, HTN, vasc, age) and yearly risk of stroke 7.2%.   Recommendations:   Coronary artery disease involving coronary bypass graft of native heart without angina pectoris No recurrence of chest pain. EKG unchanged from previous, no ischemia noted.  Chronic heart failure with preserved ejection fraction (HCC) Shortness of breath  has improved. He does not currently exercise but does try to follow and healthy diet and is working on decreasing sodium intake. Blood pressure is well controlled. He is on appropriate medication therapy. No changes to current medications.  Paroxysmal atrial fibrillation (HCC) Remains in sinus rhythm with no recurrence of afib. Tolerating Eliquis without bleeding diathesis.  Claudication  of both lower extremities (HCC) Physical exams reveal absent DP and PT pulses with palpation. BLE pale and cool to touch. Given symptoms, suspect PAD. I have placed order for bilateral lower extremity arterial duplex with ABI.  Follow-up in 6 months or sooner if needed.    Ernst Spell, AGNP-C 09/27/2022, 3:06 PM Office: [phone]

## 2022-10-23 ENCOUNTER — Ambulatory Visit: Payer: Medicare PPO

## 2022-10-23 DIAGNOSIS — I739 Peripheral vascular disease, unspecified: Secondary | ICD-10-CM | POA: Diagnosis not present

## 2022-10-25 DIAGNOSIS — M25532 Pain in left wrist: Secondary | ICD-10-CM | POA: Diagnosis not present

## 2022-10-25 DIAGNOSIS — M65341 Trigger finger, right ring finger: Secondary | ICD-10-CM | POA: Diagnosis not present

## 2022-10-31 DIAGNOSIS — Z6833 Body mass index (BMI) 33.0-33.9, adult: Secondary | ICD-10-CM | POA: Diagnosis not present

## 2022-10-31 DIAGNOSIS — E669 Obesity, unspecified: Secondary | ICD-10-CM | POA: Diagnosis not present

## 2022-10-31 DIAGNOSIS — U071 COVID-19: Secondary | ICD-10-CM | POA: Diagnosis not present

## 2022-11-06 ENCOUNTER — Other Ambulatory Visit: Payer: Self-pay | Admitting: Cardiology

## 2022-11-06 DIAGNOSIS — I25810 Atherosclerosis of coronary artery bypass graft(s) without angina pectoris: Secondary | ICD-10-CM

## 2022-11-06 MED ORDER — NITROGLYCERIN 0.4 MG SL SUBL: 0.4000 mg | SUBLINGUAL_TABLET | SUBLINGUAL | 3 refills | Status: AC | PRN

## 2022-12-17 DIAGNOSIS — I639 Cerebral infarction, unspecified: Secondary | ICD-10-CM

## 2022-12-17 HISTORY — DX: Cerebral infarction, unspecified: I63.9

## 2023-01-24 DIAGNOSIS — M13842 Other specified arthritis, left hand: Secondary | ICD-10-CM | POA: Diagnosis not present

## 2023-02-01 ENCOUNTER — Other Ambulatory Visit: Payer: Self-pay

## 2023-02-01 MED ORDER — METOPROLOL TARTRATE 25 MG PO TABS: 25.0000 mg | ORAL_TABLET | Freq: Two times a day (BID) | ORAL | 3 refills | Status: DC

## 2023-02-06 ENCOUNTER — Ambulatory Visit: Payer: Medicare PPO | Admitting: Dermatology

## 2023-02-07 DIAGNOSIS — F419 Anxiety disorder, unspecified: Secondary | ICD-10-CM | POA: Diagnosis not present

## 2023-02-07 DIAGNOSIS — E669 Obesity, unspecified: Secondary | ICD-10-CM | POA: Diagnosis not present

## 2023-02-07 DIAGNOSIS — D6869 Other thrombophilia: Secondary | ICD-10-CM | POA: Diagnosis not present

## 2023-02-07 DIAGNOSIS — I509 Heart failure, unspecified: Secondary | ICD-10-CM | POA: Diagnosis not present

## 2023-02-07 DIAGNOSIS — I10 Essential (primary) hypertension: Secondary | ICD-10-CM | POA: Diagnosis not present

## 2023-02-07 DIAGNOSIS — E876 Hypokalemia: Secondary | ICD-10-CM | POA: Diagnosis not present

## 2023-02-07 DIAGNOSIS — E261 Secondary hyperaldosteronism: Secondary | ICD-10-CM | POA: Diagnosis not present

## 2023-02-07 DIAGNOSIS — B351 Tinea unguium: Secondary | ICD-10-CM | POA: Diagnosis not present

## 2023-02-07 DIAGNOSIS — G4733 Obstructive sleep apnea (adult) (pediatric): Secondary | ICD-10-CM | POA: Diagnosis not present

## 2023-02-07 DIAGNOSIS — G589 Mononeuropathy, unspecified: Secondary | ICD-10-CM | POA: Diagnosis not present

## 2023-02-07 DIAGNOSIS — E785 Hyperlipidemia, unspecified: Secondary | ICD-10-CM | POA: Diagnosis not present

## 2023-02-07 DIAGNOSIS — G8929 Other chronic pain: Secondary | ICD-10-CM | POA: Diagnosis not present

## 2023-02-12 ENCOUNTER — Telehealth: Payer: Self-pay | Admitting: *Deleted

## 2023-02-12 NOTE — Telephone Encounter (Signed)
Does pt need a new nebulizer machine or does he need Rx for nebulizer solution sent in?  Attempted to call pt but unable to reach. Left message to return call.

## 2023-02-13 ENCOUNTER — Telehealth: Payer: Self-pay | Admitting: Emergency Medicine

## 2023-02-13 DIAGNOSIS — J449 Chronic obstructive pulmonary disease, unspecified: Secondary | ICD-10-CM

## 2023-02-13 NOTE — Telephone Encounter (Signed)
Called and spoke with patient. He stated that he is need of the nebulizer kit. I advised him that I would go ahead and place the order. He verbalized understanding.   Nothing further needed at time of call.

## 2023-02-13 NOTE — Telephone Encounter (Signed)
PT calling and states a part from his neb machine is broken. He went to [place] and they have a nebulizer kit. The pharm sent [place] a req and there was some confusion as to what the pt needed. Pls call pt to advise @ [phone]

## 2023-02-13 NOTE — Telephone Encounter (Signed)
Yes okay to place order for new nebulizer machine

## 2023-02-13 NOTE — Telephone Encounter (Signed)
Patient advises his neb machine is broken if its okay I will place an order for patient to get a new one Dr. Lamonte Sakai please advise

## 2023-02-13 NOTE — Telephone Encounter (Signed)
Best # to reach is [phone]

## 2023-02-13 NOTE — Telephone Encounter (Signed)
PT ret call again. States he was told we would place an order for the nebulizer kit, not new neb. No order has been recd by [facility] yet. Pls call PT to advise. TY.   He has not had a treatment in 2 days. Needs it bad.

## 2023-02-13 NOTE — Telephone Encounter (Signed)
Pls call back. States no one called him. His cell is best. Wife's cell is on file too. Thanks.

## 2023-02-13 NOTE — Telephone Encounter (Signed)
Called patient at both numbers and he did not answer. Will place order once the patient confirms exactly what he needs (supplies vs machine).

## 2023-02-13 NOTE — Telephone Encounter (Signed)
Collie Siad wife states [facility] does not have the [employer]. States waiting at [facility] now. [facility] [place] number is [phone].

## 2023-02-14 ENCOUNTER — Telehealth: Payer: Self-pay | Admitting: Pulmonary Disease

## 2023-02-14 NOTE — Telephone Encounter (Signed)
Phone note entered in error.     Noe Gens, MSN, APRN, NP-C, AGACNP-BC [facility] 02/14/2023, 9:03 AM   Please see [email or web address] for pager details.   From 7A-7P if no response, please call [phone] After hours, please call ELink [phone]

## 2023-02-19 DIAGNOSIS — I4819 Other persistent atrial fibrillation: Secondary | ICD-10-CM | POA: Diagnosis not present

## 2023-02-19 DIAGNOSIS — I5032 Chronic diastolic (congestive) heart failure: Secondary | ICD-10-CM | POA: Diagnosis not present

## 2023-02-19 DIAGNOSIS — E1140 Type 2 diabetes mellitus with diabetic neuropathy, unspecified: Secondary | ICD-10-CM | POA: Diagnosis not present

## 2023-02-19 DIAGNOSIS — D6869 Other thrombophilia: Secondary | ICD-10-CM | POA: Diagnosis not present

## 2023-02-19 DIAGNOSIS — Z0000 Encounter for general adult medical examination without abnormal findings: Secondary | ICD-10-CM | POA: Diagnosis not present

## 2023-02-19 DIAGNOSIS — E7800 Pure hypercholesterolemia, unspecified: Secondary | ICD-10-CM | POA: Diagnosis not present

## 2023-02-19 DIAGNOSIS — I714 Abdominal aortic aneurysm, without rupture, unspecified: Secondary | ICD-10-CM | POA: Diagnosis not present

## 2023-02-19 DIAGNOSIS — J0111 Acute recurrent frontal sinusitis: Secondary | ICD-10-CM | POA: Diagnosis not present

## 2023-02-19 DIAGNOSIS — E119 Type 2 diabetes mellitus without complications: Secondary | ICD-10-CM | POA: Diagnosis not present

## 2023-02-19 DIAGNOSIS — N1831 Chronic kidney disease, stage 3a: Secondary | ICD-10-CM | POA: Diagnosis not present

## 2023-02-19 DIAGNOSIS — I130 Hypertensive heart and chronic kidney disease with heart failure and stage 1 through stage 4 chronic kidney disease, or unspecified chronic kidney disease: Secondary | ICD-10-CM | POA: Diagnosis not present

## 2023-02-19 DIAGNOSIS — I7121 Aneurysm of the ascending aorta, without rupture: Secondary | ICD-10-CM | POA: Diagnosis not present

## 2023-02-26 ENCOUNTER — Ambulatory Visit: Payer: Medicare PPO | Admitting: Dermatology

## 2023-03-28 ENCOUNTER — Ambulatory Visit: Payer: HMO | Admitting: Internal Medicine

## 2023-03-28 ENCOUNTER — Ambulatory Visit: Payer: HMO

## 2023-03-28 NOTE — Progress Notes (Signed)
No show

## 2023-04-25 ENCOUNTER — Other Ambulatory Visit: Payer: Self-pay | Admitting: Emergency Medicine

## 2023-06-07 ENCOUNTER — Telehealth: Payer: Self-pay

## 2023-06-07 ENCOUNTER — Emergency Department (HOSPITAL_COMMUNITY): Payer: HMO

## 2023-06-07 ENCOUNTER — Other Ambulatory Visit: Payer: Self-pay

## 2023-06-07 ENCOUNTER — Encounter (HOSPITAL_COMMUNITY): Payer: Self-pay | Admitting: Emergency Medicine

## 2023-06-07 ENCOUNTER — Emergency Department (HOSPITAL_COMMUNITY)
Admission: EM | Admit: 2023-06-07 | Discharge: 2023-06-07 | Disposition: A | Discharge status: Home / Self Care | Payer: HMO | Attending: Emergency Medicine | Admitting: Emergency Medicine

## 2023-06-07 DIAGNOSIS — R0602 Shortness of breath: Secondary | ICD-10-CM | POA: Diagnosis not present

## 2023-06-07 DIAGNOSIS — I2510 Atherosclerotic heart disease of native coronary artery without angina pectoris: Secondary | ICD-10-CM | POA: Insufficient documentation

## 2023-06-07 DIAGNOSIS — J449 Chronic obstructive pulmonary disease, unspecified: Secondary | ICD-10-CM | POA: Insufficient documentation

## 2023-06-07 DIAGNOSIS — Z951 Presence of aortocoronary bypass graft: Secondary | ICD-10-CM | POA: Diagnosis not present

## 2023-06-07 DIAGNOSIS — Z87891 Personal history of nicotine dependence: Secondary | ICD-10-CM | POA: Insufficient documentation

## 2023-06-07 DIAGNOSIS — R0989 Other specified symptoms and signs involving the circulatory and respiratory systems: Secondary | ICD-10-CM | POA: Diagnosis not present

## 2023-06-07 DIAGNOSIS — Z7951 Long term (current) use of inhaled steroids: Secondary | ICD-10-CM | POA: Diagnosis not present

## 2023-06-07 DIAGNOSIS — I10 Essential (primary) hypertension: Secondary | ICD-10-CM | POA: Diagnosis not present

## 2023-06-07 DIAGNOSIS — Z7901 Long term (current) use of anticoagulants: Secondary | ICD-10-CM | POA: Diagnosis not present

## 2023-06-07 DIAGNOSIS — Z79899 Other long term (current) drug therapy: Secondary | ICD-10-CM | POA: Diagnosis not present

## 2023-06-07 DIAGNOSIS — Z85038 Personal history of other malignant neoplasm of large intestine: Secondary | ICD-10-CM | POA: Insufficient documentation

## 2023-06-07 DIAGNOSIS — J929 Pleural plaque without asbestos: Secondary | ICD-10-CM | POA: Diagnosis not present

## 2023-06-07 DIAGNOSIS — R001 Bradycardia, unspecified: Secondary | ICD-10-CM | POA: Insufficient documentation

## 2023-06-07 DIAGNOSIS — R079 Chest pain, unspecified: Secondary | ICD-10-CM | POA: Diagnosis not present

## 2023-06-07 LAB — CBC
HCT: 38 % — ABNORMAL LOW (ref 39.0–52.0)
Hemoglobin: 12.6 g/dL — ABNORMAL LOW (ref 13.0–17.0)
MCH: 28.2 pg (ref 26.0–34.0)
MCHC: 33.2 g/dL (ref 30.0–36.0)
MCV: 85 fL (ref 80.0–100.0)
Platelets: 142 10*3/uL — ABNORMAL LOW (ref 150–400)
RBC: 4.47 MIL/uL (ref 4.22–5.81)
RDW: 15 % (ref 11.5–15.5)
WBC: 4.7 10*3/uL (ref 4.0–10.5)
nRBC: 0 % (ref 0.0–0.2)

## 2023-06-07 LAB — TROPONIN I (HIGH SENSITIVITY): Troponin I (High Sensitivity): 9 ng/L (ref <18)

## 2023-06-07 LAB — BASIC METABOLIC PANEL
Anion gap: 8 (ref 5–15)
BUN: 15 mg/dL (ref 8–23)
CO2: 23 mmol/L (ref 22–32)
Calcium: 8.7 mg/dL — ABNORMAL LOW (ref 8.9–10.3)
Chloride: 104 mmol/L (ref 98–111)
Creatinine, Ser: 1.43 mg/dL — ABNORMAL HIGH (ref 0.61–1.24)
GFR, Estimated: 49 mL/min — ABNORMAL LOW (ref >60)
Glucose, Bld: 123 mg/dL — ABNORMAL HIGH (ref 70–99)
Potassium: 3.8 mmol/L (ref 3.5–5.1)
Sodium: 135 mmol/L (ref 135–145)

## 2023-06-07 NOTE — Telephone Encounter (Signed)
Please refer him to an urgent care for further evaluation.    [place], DO, [facility]

## 2023-06-07 NOTE — ED Provider Notes (Signed)
[place] EMERGENCY DEPARTMENT AT [facility] Provider Note  CSN: [id number] Arrival date & time: 06/07/23 1940  Chief Complaint(s) Bradycardia and Weakness  HPI [name] is a 82 y.o. male history of coronary artery disease, hypertension, prior PE, A-fib on Eliquis, presenting to the emergency department with episode of low heart rate.  Patient reports this morning he checked his blood pressure and his blood pressure meter said that his heart rate was in the 40s.  He reports during this time he had some mild heaviness in the chest.  He reports this was brief and has all resolved.  Triage note reports that he had said he had shortness of breath although to me he denies any shortness of breath.  He also denies any nausea or vomiting, loss of consciousness, dizziness, fainting, diaphoresis.  He reports the pain was neither pleuritic nor exertional.  Currently he feels well and at baseline.  No recent travel or surgeries.  Has been compliant with his medications.   Past Medical History Past Medical History:  Diagnosis Date   Allergy    Arthritis    BPH (benign prostatic hyperplasia)    CAD (coronary artery disease)    s/p CABG 2007; NSTEMI in setting of AFib with RVR 12/2009; cath 1/11: S-RCA ok with prox 30-40% and 50% stenoses; S-OM and RI  with patent OM limb but an occluded RI limb; S-D2 and L-LAD ok; EF 55%   Cataract    removed both eyes    Clotting disorder (HCC)    s/p CABG x 5- PE    Colon cancer (HCC) 1992   COPD (chronic obstructive pulmonary disease) (HCC)    Diverticulosis    Essential hypertension    Family history of breast cancer    Family history of colon cancer    Family history of lung cancer    GERD (gastroesophageal reflux disease)    History of pulmonary embolus (PE) 2007   Following CABG   Hyperlipidemia    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HCC)    Right upper   OSA on CPAP    Paroxysmal atrial fibrillation (HCC)    Previously on Multaq, declines  anticoagulation   Personal history of colonic polyps 01/22/2000   Tubular adenoma   RBBB (right bundle branch block)    Sleep apnea    no cpap    Squamous cell carcinoma of skin 02/11/2008   Bowens-Left paraspinal, lower    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   in situ-left forearm   Patient Active Problem List   Diagnosis Date Noted   Pain in left wrist 01/02/2022   Joint swelling 01/02/2022   Abdominal aortic aneurysm without rupture (HCC) 11/14/2021   Anxiety 11/14/2021   Balanitis 11/14/2021   Chronic kidney disease, stage 3a (HCC) 11/14/2021   Chronic pain 11/14/2021   Enlarged prostate 11/14/2021   Extrapyramidal and movement disorder, 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   Sciatica 11/14/2021   Simple chronic bronchitis (HCC) 11/14/2021   Secondary right ventricular dilation    Atrial septal defect 06/12/2021   Acute on chronic combined systolic and diastolic CHF (congestive heart failure) --EF 35 to 40% 03/26/2021   New onset a-fib (HCC) 03/25/2021   Paroxysmal A-fib with RVR 03/25/2021   History of  deep vein thrombosis (DVT)/H/o Provoked DVT in 2007 --after CABG,  03/25/2021   Low back pain 10/20/2020   Pain of right hip joint 10/20/2020   Pneumonia due to COVID-19 virus 10/04/2020   Genetic testing 02/17/2020   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   Family history of colon cancer    Family history of breast cancer    Family history of lung cancer    Allergic rhinitis 07/21/2019   Nasal obstruction 01/22/2019   Post PTCA 10/07/2018   Enrolled in clinical trial of drug 10/07/2018   COPD (chronic obstructive pulmonary disease) (HCC) 08/08/2018   CAD-  S/P stenting of high OM1/ramus intermediate with 3.0 x 23 mm Xience Sierra DES/S/p CABG 07/03/2017   Abnormal nuclear stress test 07/03/2017   Weakness 06/13/2017   Bad posture 09/08/2014   Leg weakness, bilateral 09/08/2014   Anemia 03/07/2012   Other general symptoms(780.99) 03/07/2012   GERD  (gastroesophageal reflux disease) 03/07/2012   Personal history of malignant neoplasm of rectum, rectosigmoid junction, and anus 03/07/2012   Hx of CABG 03/07/2012   Chronic diastolic heart failure (HCC) 12/06/2011   Fatigue 04/03/2011   Aortic aneurysm (HCC) 04/03/2011   SYNCOPE 02/21/2011   PALPITATIONS 02/21/2011   BRADYCARDIA 01/22/2011   ATRIAL FIBRILLATION 01/23/2010   Obstructive sleep apnea 01/16/2010   Dyspnea on exertion 05/12/2009   HYPERCHOLESTEROLEMIA 03/22/2009   HYPERLIPIDEMIA 03/22/2009   OBESITY 03/22/2009   Essential hypertension 03/22/2009   Coronary atherosclerosis 03/22/2009   BUNDLE BRANCH BLOCK, RIGHT 03/22/2009   GERD 03/22/2009   BPH (benign prostatic hyperplasia) 03/22/2009   Personal history of other specified diseases(V13.89) 03/22/2009   Personal history of colonic polyps 01/22/2000   Home Medication(s) Prior to Admission medications   Medication Sig Start Date End Date Taking? Authorizing Provider  albuterol (PROVENTIL) (2.5 MG/3ML) 0.083% nebulizer solution USE 1 VIAL IN NEBULIZER EVERY 6 HOURS AS NEEDED FOR WHEEZING FOR SHORTNESS OF BREATH 04/25/23   Leslye Peer, MD  albuterol (VENTOLIN HFA) 108 (90 Base) MCG/ACT inhaler INHALE 2 PUFFS BY MOUTH EVERY 6 HOURS AS NEEDED FOR WHEEZING OR SHORTNESS OF BREATH 11/27/21   Leslye Peer, MD  apixaban (ELIQUIS) 5 MG TABS tablet Take 1 tablet (5 mg total) by mouth 2 (two) times daily. 09/26/22   Yates Decamp, MD  atorvastatin (LIPITOR) 20 MG tablet Take 20 mg by mouth at bedtime.  03/31/18   [provider]  ciclopirox (PENLAC) 8 % solution Apply topically at bedtime. Apply over nail and surrounding skin. Apply daily over previous coat. After seven (7) days, may remove with alcohol and continue cycle. 11/14/21   Vivi Barrack, DPM  clonazePAM (KLONOPIN) 1 MG tablet Take 1 mg by mouth at bedtime as needed (sleep). 08/20/20   [provider]  fluticasone (FLONASE) 50 MCG/ACT nasal spray Place 2  sprays into both nostrils daily. 01/22/19   Lupita Leash, MD  gabapentin (NEURONTIN) 100 MG capsule Take 100-200 mg by mouth See admin instructions. Take 200 mg am & 100 mg at bedtime    [provider]  guaiFENesin (MUCINEX) 600 MG 12 hr tablet Take 600 mg by mouth daily as needed (bronchitis).    [provider]  HYDROcodone-acetaminophen (NORCO/VICODIN) 5-325 MG tablet Take 1 tablet by mouth every 6 (six) hours as needed for moderate pain.    [provider]  isosorbide mononitrate (IMDUR) 30 MG 24 hr tablet Take 1 tablet by mouth once daily 04/14/19   Yates Decamp, MD  Magnesium 200 MG TABS Take 200 mg by mouth in the morning.    [provider]  metoprolol tartrate (LOPRESSOR) 25 MG tablet Take 1 tablet (25 mg total) by mouth 2 (two) times daily. 02/01/23 01/27/24  Yates Decamp, MD  Multiple Vitamin (MULTIVITAMIN WITH MINERALS) TABS tablet Take 1 tablet by mouth in the morning.    [provider]  nitroGLYCERIN (NITROSTAT) 0.4 MG SL tablet Place 1 tablet (0.4 mg total) under the tongue every 5 (five) minutes as needed for chest pain. 11/06/22   Yates Decamp, MD  Omega-3 Fatty Acids (FISH OIL) 1000 MG CAPS Take 1,000 mg by mouth in the morning.    [provider]  omeprazole (PRILOSEC) 20 MG capsule Take 1 capsule (20 mg total) by mouth daily. 03/26/21   Shon Hale, MD  Phenylephrine-DM-GG (ROBITUSSIN COUGH/COLD CF MAX PO) Take 20 mLs by mouth every 6 (six) hours as needed (cough).    [provider]  Potassium Chloride ER 20 MEQ TBCR Take 20 mEq by mouth in the morning. 01/12/21   [provider]  STIOLTO RESPIMAT 2.5-2.5 MCG/ACT AERS INHALE 2 PUFFS BY MOUTH ONCE DAILY 03/09/21   Leslye Peer, MD  tamsulosin (FLOMAX) 0.4 MG CAPS capsule Take 0.8 mg by mouth at bedtime.    [provider]  tiZANidine (ZANAFLEX) 4 MG tablet Take 4 mg by mouth at bedtime as needed for muscle spasms.    [provider]   torsemide (DEMADEX) 20 MG tablet Take 40 mg by mouth in the morning. 01/26/21   [provider]                                                                                                                                    Past Surgical History Past Surgical History:  Procedure Laterality Date   CARDIOVERSION N/A 05/23/2021   Procedure: CARDIOVERSION;  Surgeon: Elder Negus, MD;  Location: [facility] ENDOSCOPY;  Service: Cardiovascular;  Laterality: N/A;   COLECTOMY  1992   w/ colostomy  related to colon cancer   COLON SURGERY  1993   to take down Colostomy   COLONOSCOPY     CORONARY ARTERY BYPASS GRAFT  2007   CORONARY STENT INTERVENTION N/A 10/07/2018   Procedure: CORONARY STENT INTERVENTION;  Surgeon: Yates Decamp, MD;  Location: [facility] INVASIVE CV LAB;  Service: Cardiovascular;  Laterality: N/A;   HAND LIGAMENT RECONSTRUCTION  1963 1964   tendon repair and nerve; left   INGUINAL HERNIA REPAIR  2009   LAPAROSCOPIC CHOLECYSTECTOMY  2004   PATENT FORAMEN OVALE(PFO) CLOSURE N/A 06/13/2021   Procedure: PATENT FORAMEN OVALE (PFO) CLOSURE;  Surgeon: Yates Decamp, MD;  Location: [facility] INVASIVE CV LAB;  Service: Cardiovascular;  Laterality: N/A;   POLYPECTOMY     RIGHT HEART CATH N/A 06/13/2021   Procedure: RIGHT HEART CATH;  Surgeon: Yates Decamp, MD;  Location: [facility] INVASIVE CV LAB;  Service: Cardiovascular;  Laterality: N/A;   RIGHT/LEFT HEART CATH AND CORONARY/GRAFT ANGIOGRAPHY N/A 07/03/2017   Procedure: Right/Left Heart Cath and Coronary/Graft Angiography;  Surgeon: Marykay Lex, MD;  Location: [facility] INVASIVE CV LAB;  Service: Cardiovascular;  Laterality: N/A;   RIGHT/LEFT HEART CATH  AND CORONARY/GRAFT ANGIOGRAPHY N/A 10/07/2018   Procedure: RIGHT/LEFT HEART CATH AND CORONARY/GRAFT ANGIOGRAPHY;  Surgeon: Yates Decamp, MD;  Location: [facility] INVASIVE CV LAB;  Service: Cardiovascular;  Laterality: N/A;   TEE WITHOUT CARDIOVERSION  05/23/2021   Procedure: TRANSESOPHAGEAL ECHOCARDIOGRAM (TEE);  Surgeon:  Elder Negus, MD;  Location: [facility] ENDOSCOPY;  Service: Cardiovascular;;   UMBILICAL HERNIA REPAIR     VENTRAL HERNIA REPAIR  2009   Family History Family History  Problem Relation Age of Onset   Lung cancer Mother    Heart disease Mother    Emphysema Father    Heart disease Father    Cancer Sister 57       unknown type   Breast cancer Sister        dx. >50   Colon cancer Sister        dx. >50   Breast cancer Sister        dx. >50   Colon cancer Sister        dx. >50   Colon cancer Cousin        dx. early 28s   Esophageal cancer Neg Hx    Rectal cancer Neg Hx    Stomach cancer Neg Hx    Colon polyps Neg Hx     Social History Social History   Tobacco Use   Smoking status: Former    Packs/day: 1.00    Years: 45.00    Additional pack years: 0.00    Total pack years: 45.00    Types: Cigarettes    Start date: 100    Quit date: 12/18/1995    Years since quitting: 27.4   Smokeless tobacco: Never  Vaping Use   Vaping Use: Never used  Substance Use Topics   Alcohol use: No    Comment: quit in 1980   Drug use: No   Allergies Morphine sulfate, Rosuvastatin calcium, and Tramadol  Review of Systems Review of Systems  All other systems reviewed and are negative.   Physical Exam Vital Signs  I have reviewed the triage vital signs BP 129/65   Pulse 63   Temp 97.8 F (36.6 C) (Oral)   Resp 13   Ht 5\' 11"  (1.803 m)   Wt 104.3 kg   SpO2 96%   BMI 32.08 kg/m  Physical Exam Vitals and nursing note reviewed.  Constitutional:      General: He is not in acute distress.    Appearance: Normal appearance.  HENT:     Mouth/Throat:     Mouth: Mucous membranes are moist.  Eyes:     Conjunctiva/sclera: Conjunctivae normal.  Cardiovascular:     Rate and Rhythm: Normal rate and regular rhythm.  Pulmonary:     Effort: Pulmonary effort is normal. No respiratory distress.     Breath sounds: Normal breath sounds.  Abdominal:     General: Abdomen is flat.      Palpations: Abdomen is soft.     Tenderness: There is no abdominal tenderness.  Musculoskeletal:     Right lower leg: No edema.     Left lower leg: No edema.  Skin:    General: Skin is warm and dry.     Capillary Refill: Capillary refill takes less than 2 seconds.  Neurological:     Mental Status: He is alert and oriented to person, place, and time. Mental status is at baseline.  Psychiatric:        Mood and Affect: Mood normal.  Behavior: Behavior normal.     ED Results and Treatments Labs (all labs ordered are listed, but only abnormal results are displayed) Labs Reviewed  BASIC METABOLIC PANEL - Abnormal; Notable for the following components:      Result Value   Glucose, Bld 123 (*)    Creatinine, Ser 1.43 (*)    Calcium 8.7 (*)    GFR, Estimated 49 (*)    All other components within normal limits  CBC - Abnormal; Notable for the following components:   Hemoglobin 12.6 (*)    HCT 38.0 (*)    Platelets 142 (*)    All other components within normal limits  TROPONIN I (HIGH SENSITIVITY)                                                                                                                          Radiology DG Chest 2 View  Result Date: 06/07/2023 CLINICAL DATA:  Chest pain, shortness of breath. EXAM: CHEST - 2 VIEW COMPARISON:  03/25/2021. FINDINGS: Heart is enlarged and the mediastinal contour is within normal limits. There is atherosclerotic calcification of the aorta. Pulmonary vasculature is mildly distended. Stable apical pleural thickening is present bilaterally. No consolidation, effusion, or pneumothorax. There a stable nodule in the mid right lung. Sternotomy wires are noted. There are degenerative changes in the thoracic spine. No acute osseous abnormality. IMPRESSION: Cardiomegaly with mild pulmonary vascular congestion. Electronically Signed   By: Thornell Sartorius M.D.   On: 06/07/2023 20:12    Pertinent labs & imaging results that were available during  my care of the patient were reviewed by me and considered in my medical decision making (see MDM for details).  Medications Ordered in ED Medications - No data to display                                                                                                                                   Procedures Procedures  (including critical care time)  Medical Decision Making / ED Course   MDM:  82 year old male presenting to the emergency department with episode of low heart rate.  Patient well-appearing, physical exam unremarkable.  Not bradycardic in the emergency department.  EKG without acute concerning findings.  Unclear cause of symptoms, could be related to metoprolol use, differential also includes PVCs, on monitor patient does have frequent PVCs, and if heart rate was obtained blood pressure  cuff PVCs could cause falsely lower heart reading.  Patient also complained of some chest pressure which was transient and resolved.  He did not have any other associated symptoms.  His EKG is reassuring and his troponin is negative greater than 3 hours after this episode.  He does have history of coronary artery disease so I would advise follow-up with his primary cardiologist.  Low concern of other causes of chest pain such as pneumothorax, pneumonia, pulmonary embolism, esophageal pathology, dissection.  Placed cardiology referral for expedited cardiology follow-up.  Given possible bradycardia, discussed with patient he could trial halving his metoprolol dose for the next few days until he is able to follow-up with his cardiologist  Will discharge patient to home. All questions answered. Patient comfortable with plan of discharge. Return precautions discussed with patient and specified on the after visit summary.         Additional history obtained: -Additional history obtained from spouse -External records from outside source obtained and reviewed including: Chart review  including previous notes, labs, imaging, consultation notes including prior cardiology notes   Lab Tests: -I ordered, reviewed, and interpreted labs.   The pertinent results include:   Labs Reviewed  BASIC METABOLIC PANEL - Abnormal; Notable for the following components:      Result Value   Glucose, Bld 123 (*)    Creatinine, Ser 1.43 (*)    Calcium 8.7 (*)    GFR, Estimated 49 (*)    All other components within normal limits  CBC - Abnormal; Notable for the following components:   Hemoglobin 12.6 (*)    HCT 38.0 (*)    Platelets 142 (*)    All other components within normal limits  TROPONIN I (HIGH SENSITIVITY)    Notable for normal troponin, cr similar to previous 1 yr ago  EKG   EKG Interpretation  Date/Time:  Friday June 07 2023 19:52:35 EDT Ventricular Rate:  63 PR Interval:  236 QRS Duration: 161 QT Interval:  443 QTC Calculation: 454 R Axis:   35 Text Interpretation: Sinus rhythm Prolonged PR interval Right bundle branch block Confirmed by Alvino Blood ([id number]) on 06/07/2023 9:43:27 PM         Imaging Studies ordered: I ordered imaging studies including CXR On my interpretation imaging demonstrates no acute process I independently visualized and interpreted imaging. I agree with the radiologist interpretation   Medicines ordered and prescription drug management: No orders of the defined types were placed in this encounter.   -I have reviewed the patients home medicines and have made adjustments as needed   Cardiac Monitoring: The patient was maintained on a cardiac monitor.  I personally viewed and interpreted the cardiac monitored which showed an underlying rhythm of: NSR with frequent PVC  Social Determinants of Health:  Diagnosis or treatment significantly limited by social determinants of health: obesity   Reevaluation: After the interventions noted above, I reevaluated the patient and found that their symptoms have resolved  Co morbidities  that complicate the patient evaluation  Past Medical History:  Diagnosis Date   Allergy    Arthritis    BPH (benign prostatic hyperplasia)    CAD (coronary artery disease)    s/p CABG 2007; NSTEMI in setting of AFib with RVR 12/2009; cath 1/11: S-RCA ok with prox 30-40% and 50% stenoses; S-OM and RI  with patent OM limb but an occluded RI limb; S-D2 and L-LAD ok; EF 55%   Cataract    removed both eyes  Clotting disorder (HCC)    s/p CABG x 5- PE    Colon cancer (HCC) 1992   COPD (chronic obstructive pulmonary disease) (HCC)    Diverticulosis    Essential hypertension    Family history of breast cancer    Family history of colon cancer    Family history of lung cancer    GERD (gastroesophageal reflux disease)    History of pulmonary embolus (PE) 2007   Following CABG   Hyperlipidemia    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HCC)    Right upper   OSA on CPAP    Paroxysmal atrial fibrillation (HCC)    Previously on Multaq, declines anticoagulation   Personal history of colonic polyps 01/22/2000   Tubular adenoma   RBBB (right bundle branch block)    Sleep apnea    no cpap    Squamous cell carcinoma of skin 02/11/2008   Bowens-Left paraspinal, lower    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   in situ-left forearm      Dispostion: Disposition decision including need for hospitalization was considered, and patient discharged from emergency department.    Final Clinical Impression(s) / ED Diagnoses Final diagnoses:  Bradycardia     This chart was dictated using voice recognition software.  Despite best efforts to proofread,  errors can occur which can change the documentation meaning.    Lonell Grandchild, MD 06/07/23 [id number]

## 2023-06-07 NOTE — Telephone Encounter (Signed)
Heart rate bouncing between 48-70, his bp is 115/66 Oxygen level is 95. He feels sick and weak with SOB more than usual. What should he do?

## 2023-06-07 NOTE — Discharge Instructions (Addendum)
We evaluated you for your low heart rate and episode of chest pressure.  Your testing was reassuring including your cardiac enzymes.  Your EKG showed a normal heart rate.  We think it is safe for you to go home.  Since your heart rate was so low earlier, I would recommend taking half dose of metoprolol until you follow-up with cardiology.  Please return to the emergency department if you have any new symptoms such as recurrent chest pain, difficulty breathing, fainting, lightheadedness or dizziness, fevers or chills, or any other concerning symptoms.

## 2023-06-07 NOTE — ED Triage Notes (Signed)
Pt states he has been feeling weak and having SOB and "heaviness in chest" since 0400. States he thought he "would feel better" but when he didn't, he checked his HR @ home and states it was "in the 40's". Pt states he took his Metoprolol per usual at 1100am.

## 2023-06-08 DIAGNOSIS — R001 Bradycardia, unspecified: Secondary | ICD-10-CM | POA: Diagnosis not present

## 2023-06-18 ENCOUNTER — Encounter: Payer: Self-pay | Admitting: Cardiology

## 2023-06-18 ENCOUNTER — Ambulatory Visit: Payer: HMO | Admitting: Cardiology

## 2023-06-18 VITALS — BP 118/77 | HR 83 | Resp 16 | Ht 71.0 in | Wt 245.4 lb

## 2023-06-18 DIAGNOSIS — I10 Essential (primary) hypertension: Secondary | ICD-10-CM | POA: Diagnosis not present

## 2023-06-18 DIAGNOSIS — I493 Ventricular premature depolarization: Secondary | ICD-10-CM

## 2023-06-18 DIAGNOSIS — I2510 Atherosclerotic heart disease of native coronary artery without angina pectoris: Secondary | ICD-10-CM

## 2023-06-18 DIAGNOSIS — I480 Paroxysmal atrial fibrillation: Secondary | ICD-10-CM

## 2023-06-18 NOTE — Progress Notes (Signed)
Primary Physician:  Noberto Retort, MD   Patient ID: [name], male    DOB: September 12, 1941, 82 y.o.   MRN: [id number]  Subjective:    Chief Complaint  Patient presents with   Hospitalization Follow-up   Bradycardia    HPI: TIRAS THREATS  is a 82 y.o. male  with CAD S/P  CABG in 2007 in the setting of non-STEMI in atrial fibrillation with RVR. He has a history of  45 pack year h/o smoking tobacco, ascending aortic aneurysm (follows Dr.Bryan Bartle), small abdominal aortic aneurysm noted in 2015 (3cm), H/O colon cancer, in remission since 1992, GERD, hyperlipidemia, obstructive sleep apnea now on CPAP.   Patient hospitalized 03/2021 with recurrence of atrial flutter and echocardiogram revealed new onset LVEF 35-40% with global hypokinesis. Patient underwent successful cardioversion 05/23/2021 with return to normal sinus rhythm.  TEE prior to cardioversion noted ASD, which was subsequently repaired 06/13/2021.  Patient was seen in the emergency room on 06/07/2023 for bradycardia but heart rate was 63 bpm and discharged home. Patient presents today for 6 month follow-up.  He is currently feeling well overall with no recurrence of swelling or shortness of breath. Denies chest pain, orthopnea, PND, leg edema.  He does endorse bilateral lower leg numbness, cramping, tingling, pain.  Lower extremity arterial duplex in November 2023 was normal.  Past Medical History:  Diagnosis Date   Allergy    Arthritis    BPH (benign prostatic hyperplasia)    CAD (coronary artery disease)    s/p CABG 2007; NSTEMI in setting of AFib with RVR 12/2009; cath 1/11: S-RCA ok with prox 30-40% and 50% stenoses; S-OM and RI  with patent OM limb but an occluded RI limb; S-D2 and L-LAD ok; EF 55%   Cataract    removed both eyes    Clotting disorder (HCC)    s/p CABG x 5- PE    Colon cancer (HCC) 1992   COPD (chronic obstructive pulmonary disease) (HCC)    Diverticulosis    Essential hypertension    Family  history of breast cancer    Family history of colon cancer    Family history of lung cancer    GERD (gastroesophageal reflux disease)    History of pulmonary embolus (PE) 2007   Following CABG   Hyperlipidemia    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HCC)    Right upper   OSA on CPAP    Paroxysmal atrial fibrillation (HCC)    Previously on Multaq, declines anticoagulation   Personal history of colonic polyps 01/22/2000   Tubular adenoma   RBBB (right bundle branch block)    Sleep apnea    no cpap    Squamous cell carcinoma of skin 02/11/2008   Bowens-Left paraspinal, lower    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   in situ-left forearm   Past Surgical History:  Procedure Laterality Date   CARDIOVERSION N/A 05/23/2021   Procedure: CARDIOVERSION;  Surgeon: Elder Negus, MD;  Location: [facility] ENDOSCOPY;  Service: Cardiovascular;  Laterality: N/A;   COLECTOMY  1992   w/ colostomy  related to colon cancer   COLON SURGERY  1993   to take down Colostomy   COLONOSCOPY     CORONARY ARTERY BYPASS GRAFT  2007   CORONARY STENT INTERVENTION N/A 10/07/2018   Procedure: CORONARY STENT INTERVENTION;  Surgeon: Yates Decamp, MD;  Location: [facility] INVASIVE CV LAB;  Service: Cardiovascular;  Laterality: N/A;   HAND LIGAMENT RECONSTRUCTION  1963 1964   tendon  repair and nerve; left   INGUINAL HERNIA REPAIR  2009   LAPAROSCOPIC CHOLECYSTECTOMY  2004   PATENT FORAMEN OVALE(PFO) CLOSURE N/A 06/13/2021   Procedure: PATENT FORAMEN OVALE (PFO) CLOSURE;  Surgeon: Yates Decamp, MD;  Location: [facility] INVASIVE CV LAB;  Service: Cardiovascular;  Laterality: N/A;   POLYPECTOMY     RIGHT HEART CATH N/A 06/13/2021   Procedure: RIGHT HEART CATH;  Surgeon: Yates Decamp, MD;  Location[facility] INVASIVE CV LAB;  Service: Cardiovascular;  Laterality: N/A;   RIGHT/LEFT HEART CATH AND CORONARY/GRAFT ANGIOGRAPHY N/A 07/03/2017   Procedure: Right/Left Heart Cath and Coronary/Graft Angiography;  Surgeon: Marykay Lex, MD;  Location: [facility] INVASIVE CV  LAB;  Service: Cardiovascular;  Laterality: N/A;   RIGHT/LEFT HEART CATH AND CORONARY/GRAFT ANGIOGRAPHY N/A 10/07/2018   Procedure: RIGHT/LEFT HEART CATH AND CORONARY/GRAFT ANGIOGRAPHY;  Surgeon: Yates Decamp, MD;  Location: [facility] INVASIVE CV LAB;  Service: Cardiovascular;  Laterality: N/A;   TEE WITHOUT CARDIOVERSION  05/23/2021   Procedure: TRANSESOPHAGEAL ECHOCARDIOGRAM (TEE);  Surgeon: Elder Negus, MD;  Location: [facility] ENDOSCOPY;  Service: Cardiovascular;;   UMBILICAL HERNIA REPAIR     VENTRAL HERNIA REPAIR  2009   Family History  Problem Relation Age of Onset   Lung cancer Mother    Heart disease Mother    Emphysema Father    Heart disease Father    Cancer Sister 57       unknown type   Breast cancer Sister        dx. >50   Colon cancer Sister        dx. >50   Breast cancer Sister        dx. >50   Colon cancer Sister        dx. >50   Colon cancer Cousin        dx. early 71s   Esophageal cancer Neg Hx    Rectal cancer Neg Hx    Stomach cancer Neg Hx    Colon polyps Neg Hx    Social History   Tobacco Use   Smoking status: Former    Packs/day: 1.00    Years: 45.00    Additional pack years: 0.00    Total pack years: 45.00    Types: Cigarettes    Start date: 10    Quit date: 12/18/1995    Years since quitting: 27.5   Smokeless tobacco: Never  Substance Use Topics   Alcohol use: No    Comment: quit in 1980   Marital Status: Married  ROS   Review of Systems  Cardiovascular:  Negative for chest pain, dyspnea on exertion and leg swelling.    Objective:  Blood pressure 118/77, pulse 83, resp. rate 16, height 5\' 11"  (1.803 m), weight 245 lb 6.4 oz (111.3 kg), SpO2 96 %. Body mass index is 34.23 kg/m.     06/18/2023    1:08 PM 06/07/2023    9:30 PM 06/07/2023    9:00 PM  Vitals with BMI  Height 5\' 11"     Weight 245 lbs 6 oz    BMI 34.24    Systolic 118 129 782  Diastolic 77 65 68  Pulse 83 63 62   Physical Exam Vitals reviewed.  Constitutional:       Appearance: He is well-developed.  Neck:     Vascular: No carotid bruit or JVD.  Cardiovascular:     Rate and Rhythm: Normal rate and regular rhythm. No extrasystoles are present.    Pulses: Intact distal pulses.  Femoral pulses are 2+ on the right side and 2+ on the left side.      Dorsalis pedis pulses are 1+ on the right side and 1+ on the left side.       Posterior tibial pulses are 2+ on the right side and 2+ on the left side.     Heart sounds: Normal heart sounds, S1 normal and S2 normal. No murmur heard.    No gallop.  Pulmonary:     Effort: Pulmonary effort is normal. No accessory muscle usage.     Breath sounds: Normal breath sounds.  Abdominal:     General: Bowel sounds are normal. There is no distension.     Palpations: Abdomen is soft.  Musculoskeletal:     Right lower leg: No edema.     Left lower leg: No edema.    Laboratory examination:      Latest Ref Rng & Units 06/07/2023    8:16 PM 10/11/2021    1:32 PM 06/13/2021    8:54 AM  CMP  Glucose 70 - 99 mg/dL 295  621    BUN 8 - 23 mg/dL 15  12    Creatinine 3.08 - 1.24 mg/dL 6.57  8.46    Sodium 962 - 145 mmol/L 135  140  140   Potassium 3.5 - 5.1 mmol/L 3.8  4.1  3.8   Chloride 98 - 111 mmol/L 104  102    CO2 22 - 32 mmol/L 23  22    Calcium 8.9 - 10.3 mg/dL 8.7  9.2        Latest Ref Rng & Units 06/07/2023    8:16 PM 06/13/2021    8:54 AM 06/13/2021    8:39 AM  CBC  WBC 4.0 - 10.5 K/uL 4.7     Hemoglobin 13.0 - 17.0 g/dL 95.2  84.1  32.4   Hematocrit 39.0 - 52.0 % 38.0  41.0  41.0   Platelets 150 - 400 K/uL 142      Lab Results  Component Value Date   TSH 0.630 03/25/2021     External Labs:  Labs 03/04/2023:  Serum glucose 84 mg, BUN 14, creatinine 1.1, EGFR 55, potassium 4.2.  LFTs normal.  TSH normal at 2.60.  A1c 6.5%.  Labs 08/15/2022:  Total cholesterol 137, triglycerides 143, HDL 41, LDL 70.  Radiology    CTA of chest 09/16/2019:  4.4 cm ascending thoracic aortic aneurysm.  Recommend annual imaging followup by CTA or MRA. This recommendation follows 2010 ACCF/AHA/AATS/ACR/ASA/SCA/SCAI/SIR/STS/SVM Guidelines for the Diagnosis and Management of Patients with Thoracic Aortic Disease. Circulation. 2010; 121: M010-U725. Aortic aneurysm NOS (ICD10-I71.9)   Old granulomatous disease. No acute cardiopulmonary disease. Aortic Atherosclerosis (ICD10-I70.0).  DG Chest 2 View  06/07/2023 CLINICAL DATA:  Chest pain, shortness of breath. EXAM: CHEST - 2 VIEW COMPARISON:  03/25/2021. FINDINGS: Heart is enlarged and the mediastinal contour is within normal limits. There is atherosclerotic calcification of the aorta. Pulmonary vasculature is mildly distended. Stable apical pleural thickening is present bilaterally. No consolidation, effusion, or pneumothorax. There a stable nodule in the mid right lung. Sternotomy wires are noted. There are degenerative changes in the thoracic spine. No acute osseous abnormality.  Cardiac Studies:   CABG 2007: LIMA to LAD, SVG to D2, SVG to OM1 and ramus intermediate with OM1 limb occluded, SVG to RCA with a proximal 50% stenosis by coronary angiography in July 2018. Normal LVEF.  Lexiscan sestamibi stress test 12/03/2016: No significant ST-T wave changes during stress  test. Medium defect of moderate intensity in the basal inferior, mid inferoseptal, mid inferior and mid inferolateral and apical inferior and apical lateral location consistent with Myocardial infarction mild degree of peri-infarct ischemia. LVEF 52%. Intermediate risk study.  Coronary angiogram 10/08/2018 and had a very high-grade stenosis of a large hiatal D1/ramus branch S/P 3.0 x 23 mm Sierra Xience DES. Patent LIMA to LAD, SVG to D2 and SVG to RCA widely patent. Normal right heart catheterization, preserved cardiac output and cardiac index.     Ambulatory cardiac telemetry 3 days (12/28/2021 - 01/01/2020): Predominant underlying rhythm was sinus with first-degree AV block and  bundle branch block.  Patient with single asymptomatic episode of VT lasting 4 beats.  6 episodes of SVT with the longest lasting 15 beats, which were asymptomatic.  Episodes of Mobitz type I AV block, asymptomatic and during sleep hours.  Rare PACs with frequent PVCs, both ventricular bigeminy and trigeminy were present.  There were no patient triggered events.  No evidence of pauses >3 seconds, high degree AV block, or atrial fibrillation. 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  Inferior defect closed with a 10 mm Amplatzer ASO device and the superior defect with a 12 mm Amplatzer ASO device with complete repair and abolition of shunting by double contrast method and by color Doppler.  There was no complication.  PCV ECHOCARDIOGRAM COMPLETE 03/20/2022 Left ventricle cavity is normal in size. Moderate concentric hypertrophy of the left ventricle. Normal global wall motion. Normal LV systolic function with visual EF 50-55%. Doppler evidence of grade I (impaired) diastolic dysfunction, normal LAP. The aortic root is upper normal at 3.8 cm. No significant valvular abnormality. Previous study in 2018 noted mild LA dilatation, aortic root 4.1 cm.  Lower Extremity Arterial Duplex 10/23/2022: No hemodynamically significant stenoses are identified in the right lower extremity arterial system. No hemodynamically significant stenoses are identified in the left lower extremity arterial system. This exam reveals normal perfusion of the right lower extremity (ABI 1.18). There is normal triphasic waveform pattern at the level of the ankle. This exam reveals normal perfusion of the left lower extremity (ABI 1.11). There is normal triphasic waveform pattern at the level of the ankle. Evaluate for pseudo-claudication.    EKG:   EKG 06/07/2023: Sinus rhythm with first-degree AV block at the rate of 63 bpm, normal axis, right  bundle branch block.  No evidence of ischemia.  No significant change from 09/27/2022.  Allergies & Medications   Allergies  Allergen Reactions   Morphine Sulfate Itching   Rosuvastatin Calcium Other (See Comments)    Muscle weakness   Tramadol Itching    Other reaction(s): Unknown    Current Outpatient Medications:    albuterol (PROVENTIL) (2.5 MG/3ML) 0.083% nebulizer solution, USE 1 VIAL IN NEBULIZER EVERY 6 HOURS AS NEEDED FOR WHEEZING FOR SHORTNESS OF BREATH, Disp: 90 mL, Rfl: 0   albuterol (VENTOLIN HFA) 108 (90 Base) MCG/ACT inhaler, INHALE 2 PUFFS BY MOUTH EVERY 6 HOURS AS NEEDED FOR WHEEZING OR SHORTNESS OF BREATH, Disp: 9 g, Rfl: 1   apixaban (ELIQUIS) 5 MG TABS tablet, Take 1 tablet (5 mg total) by mouth 2 (two) times daily., Disp: 180 tablet, Rfl: 2   atorvastatin (LIPITOR) 20 MG tablet, Take 20 mg by mouth at bedtime. , Disp: , Rfl: 5   ciclopirox (PENLAC) 8 % solution, Apply topically at bedtime. Apply over nail and surrounding skin.  Apply daily over previous coat. After seven (7) days, may remove with alcohol and continue cycle., Disp: 6.6 mL, Rfl: 2   clonazePAM (KLONOPIN) 1 MG tablet, Take 1 mg by mouth at bedtime as needed (sleep)., Disp: , Rfl:    fluticasone (FLONASE) 50 MCG/ACT nasal spray, Place 2 sprays into both nostrils daily., Disp: 16 g, Rfl: 2   gabapentin (NEURONTIN) 100 MG capsule, Take 100-200 mg by mouth See admin instructions. Take 200 mg am & 100 mg at bedtime, Disp: , Rfl:    guaiFENesin (MUCINEX) 600 MG 12 hr tablet, Take 600 mg by mouth daily as needed (bronchitis)., Disp: , Rfl:    HYDROcodone-acetaminophen (NORCO/VICODIN) 5-325 MG tablet, Take 1 tablet by mouth every 6 (six) hours as needed for moderate pain., Disp: , Rfl:    isosorbide mononitrate (IMDUR) 30 MG 24 hr tablet, Take 1 tablet by mouth once daily, Disp: 90 tablet, Rfl: 1   Magnesium 200 MG TABS, Take 200 mg by mouth in the morning., Disp: , Rfl:    metoprolol tartrate (LOPRESSOR) 25 MG  tablet, Take 1 tablet (25 mg total) by mouth 2 (two) times daily., Disp: 180 tablet, Rfl: 3   Multiple Vitamin (MULTIVITAMIN WITH MINERALS) TABS tablet, Take 1 tablet by mouth in the morning., Disp: , Rfl:    nitroGLYCERIN (NITROSTAT) 0.4 MG SL tablet, Place 1 tablet (0.4 mg total) under the tongue every 5 (five) minutes as needed for chest pain., Disp: 25 tablet, Rfl: 3   Omega-3 Fatty Acids (FISH OIL) 1000 MG CAPS, Take 1,000 mg by mouth in the morning., Disp: , Rfl:    omeprazole (PRILOSEC) 20 MG capsule, Take 1 capsule (20 mg total) by mouth daily., Disp: 30 capsule, Rfl: 3   Phenylephrine-DM-GG (ROBITUSSIN COUGH/COLD CF MAX PO), Take 20 mLs by mouth every 6 (six) hours as needed (cough)., Disp: , Rfl:    Potassium Chloride ER 20 MEQ TBCR, Take 20 mEq by mouth in the morning., Disp: , Rfl:    STIOLTO RESPIMAT 2.5-2.5 MCG/ACT AERS, INHALE 2 PUFFS BY MOUTH ONCE DAILY, Disp: 4 g, Rfl: 5   tamsulosin (FLOMAX) 0.4 MG CAPS capsule, Take 0.8 mg by mouth at bedtime., Disp: , Rfl:    tiZANidine (ZANAFLEX) 4 MG tablet, Take 4 mg by mouth at bedtime as needed for muscle spasms., Disp: , Rfl:    torsemide (DEMADEX) 20 MG tablet, Take 40 mg by mouth in the morning., Disp: , Rfl:    Assessment:     ICD-10-CM   1. Paroxysmal atrial fibrillation (HCC)  I48.0     2. PVC (premature ventricular contraction)  I49.3     3. Coronary artery disease involving native coronary artery of native heart without angina pectoris  I25.10     4. Essential hypertension  I10       No orders of the defined types were placed in this encounter.  There are no discontinued medications. This patients CHA2DS2-VASc Score 5 (CHF, HTN, vasc, age) and yearly risk of stroke 7.2%.   Recommendations:   ARVAL MCGUINESS is a 82 y.o. with CAD S/P  CABG in 2007 in the setting of non-STEMI in atrial fibrillation with RVR. 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  Recurrence of  atrial fibrillation/flutter in April 2022 and underwent cardioversion and has maintained sinus rhythm and improvement in LVEF.  He also was found to have a moderate-sized ASD which was repaired on 06/13/2021 with 12 mm Amplatzer ASO device.  Patient was seen in the emergency room on 06/07/2023 for bradycardia but heart rate was 63 bpm and discharged home.   1. Paroxysmal atrial fibrillation [facility]) Patient is maintaining sinus rhythm.  He remains asymptomatic without recurrence of palpitations.  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  His metoprolol dose was reduced but he has increased it back as his heart rate had increased to 100 bpm at home.  Advised to continue present medications.  2. PVC (premature ventricular contraction) Suspect his bradycardia that he noticed at home monitoring could have been an error as he does have a history of PVCs.  In the emergency room EKG monitoring he did have PVCs as well.  3. Coronary artery disease involving native coronary artery of native heart without angina pectoris From coronary disease standpoint he has remained stable without recurrence of angina pectoris.  4. Essential hypertension Blood pressure is well-controlled on present medical regimen, no changes in the medications were done today.  I reviewed his external labs, lipids under good control as well.  He is on appropriate medical therapy.  I will see him back on annual basis.  He is starting anticoagulation with Eliquis without bleeding diathesis, CBC and renal function has remained stable.   Yates Decamp, MD, [facility] 06/18/2023, 3:31  PM Office: [phone] Fax: [phone] Pager: [phone]

## 2023-07-08 ENCOUNTER — Other Ambulatory Visit: Payer: Self-pay | Admitting: Cardiology

## 2023-07-31 NOTE — Telephone Encounter (Signed)
done

## 2023-08-26 ENCOUNTER — Other Ambulatory Visit: Payer: Self-pay | Admitting: Surgery

## 2023-08-26 DIAGNOSIS — I7121 Aneurysm of the ascending aorta, without rupture: Secondary | ICD-10-CM

## 2023-09-03 DIAGNOSIS — I130 Hypertensive heart and chronic kidney disease with heart failure and stage 1 through stage 4 chronic kidney disease, or unspecified chronic kidney disease: Secondary | ICD-10-CM | POA: Diagnosis not present

## 2023-09-03 DIAGNOSIS — I4819 Other persistent atrial fibrillation: Secondary | ICD-10-CM | POA: Diagnosis not present

## 2023-09-03 DIAGNOSIS — E7800 Pure hypercholesterolemia, unspecified: Secondary | ICD-10-CM | POA: Diagnosis not present

## 2023-09-03 DIAGNOSIS — I5032 Chronic diastolic (congestive) heart failure: Secondary | ICD-10-CM | POA: Diagnosis not present

## 2023-09-03 DIAGNOSIS — G8929 Other chronic pain: Secondary | ICD-10-CM | POA: Diagnosis not present

## 2023-09-03 DIAGNOSIS — I10 Essential (primary) hypertension: Secondary | ICD-10-CM | POA: Diagnosis not present

## 2023-09-03 DIAGNOSIS — E1122 Type 2 diabetes mellitus with diabetic chronic kidney disease: Secondary | ICD-10-CM | POA: Diagnosis not present

## 2023-09-03 DIAGNOSIS — N1831 Chronic kidney disease, stage 3a: Secondary | ICD-10-CM | POA: Diagnosis not present

## 2023-09-03 DIAGNOSIS — J410 Simple chronic bronchitis: Secondary | ICD-10-CM | POA: Diagnosis not present

## 2023-09-03 DIAGNOSIS — D6869 Other thrombophilia: Secondary | ICD-10-CM | POA: Diagnosis not present

## 2023-09-03 DIAGNOSIS — K219 Gastro-esophageal reflux disease without esophagitis: Secondary | ICD-10-CM | POA: Diagnosis not present

## 2023-09-03 DIAGNOSIS — G259 Extrapyramidal and movement disorder, unspecified: Secondary | ICD-10-CM | POA: Diagnosis not present

## 2023-09-06 ENCOUNTER — Other Ambulatory Visit: Payer: Self-pay | Admitting: Surgery

## 2023-09-06 DIAGNOSIS — I7121 Aneurysm of the ascending aorta, without rupture: Secondary | ICD-10-CM

## 2023-09-12 ENCOUNTER — Ambulatory Visit (HOSPITAL_COMMUNITY)
Admission: RE | Admit: 2023-09-12 | Discharge: 2023-09-12 | Disposition: A | Discharge status: Home / Self Care | Payer: HMO | Source: Ambulatory Visit | Attending: Surgery | Admitting: Surgery

## 2023-09-12 DIAGNOSIS — I7121 Aneurysm of the ascending aorta, without rupture: Secondary | ICD-10-CM | POA: Insufficient documentation

## 2023-09-12 DIAGNOSIS — I700 Atherosclerosis of aorta: Secondary | ICD-10-CM | POA: Diagnosis not present

## 2023-09-12 DIAGNOSIS — I259 Chronic ischemic heart disease, unspecified: Secondary | ICD-10-CM | POA: Diagnosis not present

## 2023-09-12 DIAGNOSIS — I719 Aortic aneurysm of unspecified site, without rupture: Secondary | ICD-10-CM | POA: Diagnosis not present

## 2023-09-12 MED ORDER — IOHEXOL 350 MG/ML SOLN
75.0000 mL | Freq: Once | INTRAVENOUS | Status: AC | PRN
  Administered 2023-09-12: 75 mL via INTRAVENOUS

## 2023-09-20 ENCOUNTER — Other Ambulatory Visit: Payer: HMO

## 2023-09-25 ENCOUNTER — Ambulatory Visit: Payer: HMO

## 2023-09-26 ENCOUNTER — Encounter: Payer: Self-pay | Admitting: Surgery

## 2023-10-19 ENCOUNTER — Other Ambulatory Visit: Payer: Self-pay | Admitting: Cardiology

## 2023-10-21 NOTE — Telephone Encounter (Signed)
Prescription refill request for Eliquis received. Indication:afib Last office visit:7/24 Scr:1.43  6/24 Age: 82 Weight:111.3  kg  Prescription refilled

## 2023-10-28 ENCOUNTER — Other Ambulatory Visit: Payer: Self-pay | Admitting: Family Medicine

## 2023-10-28 DIAGNOSIS — D6869 Other thrombophilia: Secondary | ICD-10-CM | POA: Diagnosis not present

## 2023-10-28 DIAGNOSIS — G8929 Other chronic pain: Secondary | ICD-10-CM | POA: Diagnosis not present

## 2023-10-28 DIAGNOSIS — R252 Cramp and spasm: Secondary | ICD-10-CM | POA: Diagnosis not present

## 2023-10-28 DIAGNOSIS — I5032 Chronic diastolic (congestive) heart failure: Secondary | ICD-10-CM | POA: Diagnosis not present

## 2023-10-28 DIAGNOSIS — N1831 Chronic kidney disease, stage 3a: Secondary | ICD-10-CM | POA: Diagnosis not present

## 2023-10-28 DIAGNOSIS — R2689 Other abnormalities of gait and mobility: Secondary | ICD-10-CM

## 2023-10-28 DIAGNOSIS — I4819 Other persistent atrial fibrillation: Secondary | ICD-10-CM | POA: Diagnosis not present

## 2023-10-28 DIAGNOSIS — R7303 Prediabetes: Secondary | ICD-10-CM | POA: Diagnosis not present

## 2023-10-28 DIAGNOSIS — E119 Type 2 diabetes mellitus without complications: Secondary | ICD-10-CM | POA: Diagnosis not present

## 2023-10-28 DIAGNOSIS — G259 Extrapyramidal and movement disorder, unspecified: Secondary | ICD-10-CM | POA: Diagnosis not present

## 2023-10-28 DIAGNOSIS — I130 Hypertensive heart and chronic kidney disease with heart failure and stage 1 through stage 4 chronic kidney disease, or unspecified chronic kidney disease: Secondary | ICD-10-CM | POA: Diagnosis not present

## 2023-10-28 DIAGNOSIS — E7800 Pure hypercholesterolemia, unspecified: Secondary | ICD-10-CM | POA: Diagnosis not present

## 2023-11-01 ENCOUNTER — Other Ambulatory Visit (HOSPITAL_COMMUNITY): Payer: Self-pay | Admitting: Family Medicine

## 2023-11-01 DIAGNOSIS — R2689 Other abnormalities of gait and mobility: Secondary | ICD-10-CM

## 2023-11-01 DIAGNOSIS — R269 Unspecified abnormalities of gait and mobility: Secondary | ICD-10-CM

## 2023-11-11 ENCOUNTER — Encounter: Payer: Self-pay | Admitting: Internal Medicine

## 2023-11-11 ENCOUNTER — Encounter (HOSPITAL_COMMUNITY): Payer: Self-pay

## 2023-11-11 ENCOUNTER — Ambulatory Visit (HOSPITAL_COMMUNITY): Payer: HMO

## 2023-11-16 ENCOUNTER — Ambulatory Visit (HOSPITAL_COMMUNITY): Payer: HMO

## 2023-11-19 NOTE — Progress Notes (Signed)
Telephone Encounter:

## 2023-11-21 ENCOUNTER — Encounter (HOSPITAL_COMMUNITY): Payer: Self-pay

## 2023-11-21 ENCOUNTER — Ambulatory Visit (HOSPITAL_COMMUNITY)
Admission: RE | Admit: 2023-11-21 | Discharge: 2023-11-21 | Disposition: A | Discharge status: Home / Self Care | Payer: HMO | Source: Ambulatory Visit | Attending: Family Medicine | Admitting: Family Medicine

## 2023-11-21 DIAGNOSIS — R269 Unspecified abnormalities of gait and mobility: Secondary | ICD-10-CM

## 2023-11-21 DIAGNOSIS — R2689 Other abnormalities of gait and mobility: Secondary | ICD-10-CM

## 2023-11-25 DIAGNOSIS — J019 Acute sinusitis, unspecified: Secondary | ICD-10-CM | POA: Diagnosis not present

## 2023-11-25 DIAGNOSIS — Z6834 Body mass index (BMI) 34.0-34.9, adult: Secondary | ICD-10-CM | POA: Diagnosis not present

## 2023-11-27 ENCOUNTER — Telehealth: Payer: Self-pay | Admitting: Physician Assistant

## 2023-11-27 ENCOUNTER — Ambulatory Visit (INDEPENDENT_AMBULATORY_CARE_PROVIDER_SITE_OTHER): Payer: HMO | Admitting: Physician Assistant

## 2023-11-27 DIAGNOSIS — I7121 Aneurysm of the ascending aorta, without rupture: Secondary | ICD-10-CM | POA: Diagnosis not present

## 2023-11-27 NOTE — Telephone Encounter (Signed)
[address]       [name] [address]             [phone]   PCP is Noberto Retort, MD Referring Provider is Noberto Retort, MD  Chief Complaint:Ascending thoracic aortic aneurysm   HPI: This is an 82 year old male with a past medical history of hypertension, atrial fibrillation, bundle branch block, coronary artery disease status post CABG, history of combined systolic and diastolic heart failure, history of DVT following CABG, and stage IIIa chronic kidney disease. 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 He was last seen by my colleague October 2023 and the ATAA measured 4.4 cm at that time. He presents today for further surveillance of his ATAA. He denies chest pain, pressure, or tightness. He lost his wife on November 1  Past Medical History:  Diagnosis Date   Allergy    Arthritis    BPH (benign prostatic hyperplasia)    CAD (coronary artery disease)    s/p CABG 2007; NSTEMI in setting of AFib with RVR 12/2009; cath 1/11: S-RCA ok with prox 30-40% and 50% stenoses; S-OM and RI  with patent OM limb but an occluded RI limb; S-D2 and L-LAD ok; EF 55%   Cataract    removed both eyes    Clotting disorder (HCC)    s/p CABG x 5- PE    Colon cancer (HCC) 1992   COPD (chronic obstructive pulmonary disease) (HCC)    Diverticulosis    Essential hypertension    Family history of breast cancer    Family history of colon cancer    Family history of lung cancer    GERD (gastroesophageal reflux disease)    History of pulmonary embolus (PE) 2007   Following CABG   Hyperlipidemia    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HCC)    Right upper   OSA on CPAP    Paroxysmal atrial fibrillation (HCC)    Previously on Multaq, declines anticoagulation   Personal history of colonic polyps 01/22/2000   Tubular adenoma   RBBB (right bundle branch block)    Sleep apnea    no cpap    Squamous cell carcinoma of skin  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   Bowens-Left paraspinal, lower    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   in situ-left forearm    Past Surgical History:  Procedure Laterality Date   CARDIOVERSION N/A 05/23/2021   Procedure: CARDIOVERSION;  Surgeon: Elder Negus, MD;  Location: [facility] ENDOSCOPY;  Service: Cardiovascular;  Laterality: N/A;   COLECTOMY  1992   w/ colostomy  related to colon cancer   COLON SURGERY  1993   to take down Colostomy   COLONOSCOPY     CORONARY ARTERY BYPASS GRAFT  2007   CORONARY STENT INTERVENTION N/A 10/07/2018   Procedure: CORONARY STENT INTERVENTION;  Surgeon: Yates Decamp, MD;  Location: [facility] INVASIVE CV LAB;  Service: Cardiovascular;  Laterality: N/A;   HAND LIGAMENT RECONSTRUCTION  1963 1964   tendon repair and nerve; left   INGUINAL HERNIA REPAIR  2009   LAPAROSCOPIC CHOLECYSTECTOMY  2004   PATENT FORAMEN OVALE(PFO) CLOSURE N/A 06/13/2021   Procedure: PATENT FORAMEN OVALE (PFO) CLOSURE;  Surgeon: Yates Decamp, MD;  Location: [facility] IN[facility]AS[facility] CV [facility];  Service: Cardiovascular;  Laterality: N/A;   POLYPECTOMY     RIGHT HEART CATH N/A 06/13/2021   Procedure: RIGHT HEART CATH;  Surgeon: Yates Decamp, MD;  Location: [facility];  Service: Cardiovascular;  Laterality: N/A;   RIGHT/LEFT HEART CATH AND CORONARY/GRAFT ANGIOGRAPHY N/A 07/03/2017   Procedure: Right/Left Heart Cath and Coronary/Graft Angiography;  Surgeon: Marykay Lex, MD;  Location: [facility] INVASIVE CV LAB;  Service: Cardiovascular;  Laterality: N/A;   RIGHT/LEFT HEART CATH AND CORONARY/GRAFT ANGIOGRAPHY N/A 10/07/2018   Procedure: RIGHT/LEFT HEART CATH AND CORONARY/GRAFT ANGIOGRAPHY;  Surgeon: Yates Decamp, MD;  Location: [facility] INVASIVE CV LAB;  Service: Cardiovascular;  Laterality: N/A;   TEE WITHOUT CARDIOVERSION  05/23/2021   Procedure: TRANSESOPHAGEAL ECHOCARDIOGRAM (TEE);  Surgeon: Elder Negus, MD;  Location: [facility] ENDOSCOPY;  Service: Cardiovascular;;   UMBILICAL HERNIA REPAIR     VENTRAL HERNIA REPAIR  2009     Family History  Problem Relation Age of Onset   Lung cancer Mother    Heart disease Mother    Emphysema Father    Heart disease Father    Cancer Sister 56       unknown type   Breast cancer Sister        dx. >50   Colon cancer Sister        dx. >50   Breast cancer Sister        dx. >50   Colon cancer Sister        dx. >50   Colon cancer Cousin        dx. early 62s   Esophageal cancer Neg Hx    Rectal cancer Neg Hx    Stomach cancer Neg Hx    Colon polyps Neg Hx     Social History Social History   Tobacco Use   Smoking status: Former    Current packs/day: 0.00    Average packs/day: 1 pack/day for 45.0 years (45.0 ttl pk-yrs)    Types: Cigarettes    Start date: 16    Quit date: 12/18/1995    Years since quitting: 27.9   Smokeless tobacco: Never  Vaping Use   Vaping status: Never Used  Substance Use Topics   Alcohol use: No    Comment: quit in 1980   Drug use: No    Current Outpatient Medications  Medication Sig Dispense Refill   albuterol (PROVENTIL) (2.5 MG/3ML) 0.083% nebulizer solution USE 1 VIAL IN NEBULIZER EVERY 6 HOURS AS NEEDED FOR WHEEZING FOR SHORTNESS OF BREATH 90 mL 0   albuterol (VENTOLIN HFA) 108 (90 Base) MCG/ACT inhaler INHALE 2 PUFFS BY MOUTH EVERY 6 HOURS AS NEEDED FOR WHEEZING OR SHORTNESS OF BREATH 9 g 1   apixaban (ELIQUIS) 5 MG TABS tablet Take 1 tablet by mouth twice daily 180 tablet 1   atorvastatin (LIPITOR) 20 MG tablet Take 20 mg by mouth at bedtime.   5   ciclopirox (PENLAC) 8 % solution Apply topically at bedtime. Apply over nail and surrounding skin. Apply daily over previous coat. After seven (7) days, may remove with alcohol and continue cycle. 6.6 mL 2   clonazePAM (KLONOPIN) 1 MG tablet Take 1 mg by mouth at bedtime as needed (sleep).     fluticasone (FLONASE) 50 MCG/ACT nasal spray Place 2 sprays into both nostrils daily. 16 g 2   gabapentin (NEURONTIN) 100 MG capsule Take 100-200 mg by mouth See admin instructions. Take 200  mg am & 100 mg at bedtime     guaiFENesin (MUCINEX) 600 MG 12 hr tablet Take 600 mg by mouth daily as needed (bronchitis).     HYDROcodone-acetaminophen (NORCO/VICODIN) 5-325 MG tablet Take 1 tablet by mouth  every 6 (six) hours as needed for moderate pain.     isosorbide mononitrate (IMDUR) 30 MG 24 hr tablet Take 1 tablet by mouth once daily 90 tablet 1   Magnesium 200 MG TABS Take 200 mg by mouth in the morning.     metoprolol tartrate (LOPRESSOR) 25 MG tablet Take 1 tablet (25 mg total) by mouth 2 (two) times daily. 180 tablet 3   Multiple Vitamin (MULTIVITAMIN WITH MINERALS) TABS tablet Take 1 tablet by mouth in the morning.     nitroGLYCERIN (NITROSTAT) 0.4 MG SL tablet Place 1 tablet (0.4 mg total) under the tongue every 5 (five) minutes as needed for chest pain. 25 tablet 3   Omega-3 Fatty Acids (FISH OIL) 1000 MG CAPS Take 1,000 mg by mouth in the morning.     omeprazole (PRILOSEC) 20 MG capsule Take 1 capsule (20 mg total) by mouth daily. 30 capsule 3   Phenylephrine-DM-GG (ROBITUSSIN COUGH/COLD CF MAX PO) Take 20 mLs by mouth every 6 (six) hours as needed (cough).     Potassium Chloride ER 20 MEQ TBCR Take 20 mEq by mouth in the morning.     STIOLTO RESPIMAT 2.5-2.5 MCG/ACT AERS INHALE 2 PUFFS BY MOUTH ONCE DAILY 4 g 5   tamsulosin (FLOMAX) 0.4 MG CAPS capsule Take 0.8 mg by mouth at bedtime.     tiZANidine (ZANAFLEX) 4 MG tablet Take 4 mg by mouth at bedtime as needed for muscle spasms.     torsemide (DEMADEX) 20 MG tablet Take 40 mg by mouth in the morning.      Allergies  Allergen Reactions   Morphine Sulfate Itching   Rosuvastatin Calcium Other (See Comments)    Muscle weakness   Tramadol Itching    Other reaction(s): Unknown  Telephone encounter so no vital signs or PE done.   Diagnostic Tests: Narrative & Impression  CLINICAL DATA:  Follow-up thoracic aneurysm   EXAM: CT ANGIOGRAPHY CHEST WITH CONTRAST   TECHNIQUE: Multidetector CT imaging of the chest was  performed using the standard protocol during bolus administration of intravenous contrast. Multiplanar CT image reconstructions and MIPs were obtained to evaluate the vascular anatomy.   RADIATION DOSE REDUCTION: This exam was performed according to the departmental dose-optimization program which includes automated exposure control, adjustment of the mA and/or kV according to patient size and/or use of iterative reconstruction technique.   CONTRAST:  75mL OMNIPAQUE IOHEXOL 350 MG/ML SOLN   COMPARISON:  09/17/2022   FINDINGS: Cardiovascular: Atherosclerotic calcifications of the thoracic aorta are noted. The ascending aorta measures 4.3 cm similar to that seen on the prior exam. No findings to suggest dissection are noted. Changes of prior coronary bypass grafting are seen. Heavy coronary calcifications are noted. No cardiac enlargement is noted. The pulmonary artery is well visualized although the degree of opacification is limited. No large central pulmonary embolus is noted.   Mediastinum/Nodes: Thoracic inlet is within normal limits. No hilar or mediastinal adenopathy is noted. The esophagus is within normal limits.   Lungs/Pleura: Calcified granuloma is noted in the right upper lobe. Stable Peri fissural lymph node is noted on image number 95 of series 7 similar to that noted on the prior exam. No sizable parenchymal nodule is noted. Mild apical scarring is seen.   Upper Abdomen: Visualized upper abdomen shows changes of prior cholecystectomy.   Musculoskeletal: Degenerative changes of the thoracic spine are noted. No rib abnormality is noted   Review of the MIP images confirms the above findings.  IMPRESSION: Stable ascending thoracic aneurysm measuring 4.3 cm. Recommend annual imaging followup by CTA or MRA. This recommendation follows 2010 ACCF/AHA/AATS/ACR/ASA/SCA/SCAI/SIR/STS/SVM Guidelines for the Diagnosis and Management of Patients with Thoracic Aortic  Disease. Circulation. 2010; 121: Z610-R604. Aortic aneurysm NOS (ICD10-I71.9)   Remainder of the exam is stable from the prior study     Electronically Signed   By: Alcide Clever M.D.   On: 09/14/2023 23:48    Impression and Plan:     Impression and Plan: We discussed the results of the CTA done 09/14/2023. CTA done in September showed  a 4.3  cm ascending thoracic aortic aneurysm.  Echocardiogram done June 2022 showed aortic valve is tricuspid and aortic valve regurgitation was not visualized.We discussed risk modifications: control of blood pressure (he is to continue on Imdur and Lopressor), continue with statin (Atorvastatin), not smoking (he quit in 1997) so this does not apply, he has no family history of connective tissue disease in his family and per last echo, no bicuspid valve or AI, and avoidance of fluoroquinolone antibiotics. He will have another CTA and phone call in one year.     Ardelle Balls, PA-C [facility] [phone]

## 2023-11-28 ENCOUNTER — Encounter (HOSPITAL_COMMUNITY): Payer: Self-pay | Admitting: Radiology

## 2023-11-28 ENCOUNTER — Ambulatory Visit (HOSPITAL_COMMUNITY)
Admission: RE | Admit: 2023-11-28 | Discharge: 2023-11-28 | Disposition: A | Discharge status: Home / Self Care | Payer: HMO | Source: Ambulatory Visit | Attending: Family Medicine | Admitting: Family Medicine

## 2023-11-28 DIAGNOSIS — R269 Unspecified abnormalities of gait and mobility: Secondary | ICD-10-CM | POA: Diagnosis not present

## 2023-11-28 DIAGNOSIS — J329 Chronic sinusitis, unspecified: Secondary | ICD-10-CM | POA: Diagnosis not present

## 2023-11-28 DIAGNOSIS — R2689 Other abnormalities of gait and mobility: Secondary | ICD-10-CM | POA: Insufficient documentation

## 2023-11-28 DIAGNOSIS — I6782 Cerebral ischemia: Secondary | ICD-10-CM | POA: Diagnosis not present

## 2023-11-28 DIAGNOSIS — G319 Degenerative disease of nervous system, unspecified: Secondary | ICD-10-CM | POA: Diagnosis not present

## 2023-12-31 ENCOUNTER — Encounter: Payer: Self-pay | Admitting: Internal Medicine

## 2023-12-31 ENCOUNTER — Ambulatory Visit: Payer: HMO | Admitting: Internal Medicine

## 2023-12-31 ENCOUNTER — Telehealth: Payer: Self-pay

## 2023-12-31 ENCOUNTER — Other Ambulatory Visit: Payer: Self-pay | Admitting: Emergency Medicine

## 2023-12-31 VITALS — BP 110/76 | HR 64 | Ht 71.0 in | Wt 237.6 lb

## 2023-12-31 DIAGNOSIS — Z860100 Personal history of colon polyps, unspecified: Secondary | ICD-10-CM | POA: Diagnosis not present

## 2023-12-31 DIAGNOSIS — Z7901 Long term (current) use of anticoagulants: Secondary | ICD-10-CM

## 2023-12-31 DIAGNOSIS — K219 Gastro-esophageal reflux disease without esophagitis: Secondary | ICD-10-CM | POA: Diagnosis not present

## 2023-12-31 DIAGNOSIS — Z800 Family history of malignant neoplasm of digestive organs: Secondary | ICD-10-CM | POA: Diagnosis not present

## 2023-12-31 MED ORDER — SUTAB 1479-225-188 MG PO TABS: 24.0000 | ORAL_TABLET | ORAL | 0 refills | Status: DC

## 2023-12-31 NOTE — Telephone Encounter (Signed)
  [name] 03-Oct-1941 [id number]  @DATE @   Dear Dr. Ladona:  We have scheduled the above named patient for a(n) colonoscopy procedure. Our records show that (s)he is on anticoagulation therapy.  Please advise as to whether the patient may come off their therapy of Eliquis  2 days prior to their procedure which is scheduled for 02/25/24.  Please route your response to Alan Fusi, CMA.  Sincerely,    [facility]

## 2023-12-31 NOTE — Telephone Encounter (Signed)
 Please see note from Dr. Jacinto Halim. Dr. Jacinto Halim is requesting Eliquis be held for 3 days. Is this ok Dr. Rhea Belton?

## 2023-12-31 NOTE — Telephone Encounter (Signed)
 Left message for patient to return my call.

## 2023-12-31 NOTE — Patient Instructions (Signed)
 You have been scheduled for a colonoscopy. Please follow written instructions given to you at your visit today.   Please pick up your prep supplies at the pharmacy within the next 1-3 days.  If you use inhalers (even only as needed), please bring them with you on the day of your procedure.  DO NOT TAKE 7 DAYS PRIOR TO TEST- Trulicity (dulaglutide) Ozempic, Wegovy (semaglutide) Mounjaro (tirzepatide) Bydureon Bcise (exanatide extended release)  DO NOT TAKE 1 DAY PRIOR TO YOUR TEST Rybelsus (semaglutide) Adlyxin (lixisenatide) Victoza (liraglutide) Byetta (exanatide) _______________________________________________________________________ [name] will be contacted by our office prior to your procedure for directions on holding your Eliquis .  If you do not hear from our office 1 week prior to your scheduled procedure, please call [phone] to discuss.   You will receive your bowel preparation through [facility], which ensures the lowest copay and home delivery, with outreach via text or call from an [phone] number. Please respond promptly to avoid rescheduling. If you are interested in alternative options or have any questions please contact them at [phone]  Your Provider Has Sent Your Bowel Prep Regimen To [facility] What to expect. [facility] will contact you to verify your information and collect your copay, if applicable. Enjoy the comfort of your home while we deliver your prescription to you, free of any shipping charges. Fast, FREE delivery or shipping. [facility] accepts all major insurance benefits and applies discounts & coupons  Have additional questions? [facility]'s patient care team is always here to help.  Chat: [email or web address] Call: [phone] Email: [email or web address] NCPDP: [id number] How will we contact you? Welcome Phone call  a Welcome text and a Checkout link in a text Texts you receive from [phone] Are Not Spam.   *To set up delivery, you  must complete the checkout process via link or speak to one of our patient care representatives. If we are unable to reach you, your prescription may be delayed.  _______________________________________________________  If your blood pressure at your visit was 140/90 or greater, please contact your primary care physician to follow up on this.  _______________________________________________________  If you are age 45 or older, your body mass index should be between 23-30. Your Body mass index is 33.14 kg/m. If this is out of the aforementioned range listed, please consider follow up with your Primary Care Provider.  If you are age 47 or younger, your body mass index should be between 19-25. Your Body mass index is 33.14 kg/m. If this is out of the aformentioned range listed, please consider follow up with your Primary Care Provider.   ________________________________________________________  The [facility] providers would like to encourage you to use MYCHART to communicate with providers for non-urgent requests or questions.  Due to long hold times on the telephone, sending your provider a message by [facility] may be a faster and more efficient way to get a response.  Please allow 48 business hours for a response.  Please remember that this is for non-urgent requests.  _______________________________________________________

## 2023-12-31 NOTE — Progress Notes (Signed)
 Patient ID: [name], male   DOB: [birth year], 83 y.o.   MRN: [id number] HPI: [name] is an 83 year old male known to me with a history of multiple, greater than 10, adenomatous colon polyps, diverticulosis, family history of colon cancer, GERD, CAD, COPD, hypertension on Eliquis  who is here to follow-up.  He is here alone today  He has been coping with the recent loss of his spouse, who had a complex medical history including suspected pancreatic cancer. The patient reports feeling fairly well despite the emotional toll of his spouse's passing. He has a supportive family network, including a daughter who works in ICU.  One of his grandsons is living with him.  The patient's last colonoscopy was in December 2021, during which six polyps were removed. This followed a procedure in December 2020 where fifteen polyps were excised.  The patient is currently on Eliquis , prescribed by Dr. Ladona. He expresses willingness to undergo another colonoscopy, acknowledging the importance of early detection and removal of any potential polyps.   The patient's overall health and well-being appear stable, with no new symptoms or concerns reported during the consultation.  He denies chest pain, shortness of breath.  No abdominal pain.  No change in bowel habits.  No blood in stool or melena.  No upper GI or hepatobiliary complaint.  He has continued omeprazole  20 mg daily which controls his heartburn.   Past Medical History:  Diagnosis Date   Allergy    Arthritis    BPH (benign prostatic hyperplasia)    CAD (coronary artery disease)    s/p CABG 2007; NSTEMI in setting of AFib with RVR 12/2009; cath 1/11: S-RCA ok with prox 30-40% and 50% stenoses; S-OM and RI  with patent OM limb but an occluded RI limb; S-D2 and L-LAD ok; EF 55%   Cataract    removed both eyes    Clotting disorder (HCC)    s/p CABG x 5- PE    Colon cancer (HCC) 1992   COPD (chronic obstructive pulmonary disease) (HCC)     Diverticulosis    Essential hypertension    Family history of breast cancer    Family history of colon cancer    Family history of lung cancer    GERD (gastroesophageal reflux disease)    History of pulmonary embolus (PE) 2007   Following CABG   Hyperlipidemia    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HCC)    Right upper   OSA on CPAP    Paroxysmal atrial fibrillation (HCC)    Previously on Multaq, declines anticoagulation   Personal history of colonic polyps 01/22/2000   Tubular adenoma   RBBB (right bundle branch block)    Sleep apnea    no cpap    Squamous cell carcinoma of skin 02/11/2008   Bowens-Left paraspinal, lower    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   in situ-left forearm    Past Surgical History:  Procedure Laterality Date   CARDIOVERSION N/A 05/23/2021   Procedure: CARDIOVERSION;  Surgeon: Elmira Newman PARAS, MD;  Location: [facility] ENDOSCOPY;  Service: Cardiovascular;  Laterality: N/A;   COLECTOMY  1992   w/ colostomy  related to colon cancer   COLON SURGERY  1993   to take down Colostomy   COLONOSCOPY     CORONARY ARTERY BYPASS GRAFT  2007   CORONARY STENT INTERVENTION N/A 10/07/2018   Procedure: CORONARY STENT INTERVENTION;  Surgeon: Ladona Heinz, MD;  Location: [facility] INVASIVE CV LAB;  Service: Cardiovascular;  Laterality: N/A;  HAND LIGAMENT RECONSTRUCTION  1963 1964   tendon repair and nerve; left   INGUINAL HERNIA REPAIR  2009   LAPAROSCOPIC CHOLECYSTECTOMY  2004   PATENT FORAMEN OVALE(PFO) CLOSURE N/A 06/13/2021   Procedure: PATENT FORAMEN OVALE (PFO) CLOSURE;  Surgeon: Ladona Heinz, MD;  Location: [facility] INVAS[facility] CV LAB;  Service: Cardiovascular;  Laterality: N/A;   POLYPECTOMY     RIGHT HEART CATH N/A 06/13/2021   Procedure: RIGHT HEART CATH;  Surgeon: Ladona Heinz, MD;  Location: [facility] INVASIVE CV LAB;  Service: Cardiovascular;  Laterality: N/A;   RIGHT/LEFT HEART CATH AND CORONARY/GRAFT ANGIOGRAPHY N/A 07/03/2017   Procedure: Right/Left Heart Cath and Coronary/Graft Angiography;   Surgeon: Anner Alm ORN, MD;  Location[facility] INVASIVE CV LAB;  Service: Cardiovascular;  Laterality: N/A;   RIGHT/LEFT HEART CATH AND CORONARY/GRAFT ANGIOGRAPHY N/A 10/07/2018   Procedure: RIGHT/LEFT HEART CATH AND CORONARY/GRAFT ANGIOGRAPHY;  Surgeon: Ladona Heinz, MD;  Location: [facility] INVASIVE CV LAB;  Service: Cardiovascular;  Laterality: N/A;   TEE WITHOUT CARDIOVERSION  05/23/2021   Procedure: TRANSESOPHAGEAL ECHOCARDIOGRAM (TEE);  Surgeon: Elmira Newman PARAS, MD;  Location: [facility] ENDOSCOPY;  Service: Cardiovascular;;   UMBILICAL HERNIA REPAIR     VENTRAL HERNIA REPAIR  2009    Outpatient Medications Prior to Visit  Medication Sig Dispense Refill   albuterol  (PROVENTIL ) (2.5 MG/3ML) 0.083% nebulizer solution USE 1 VIAL IN NEBULIZER EVERY 6 HOURS AS NEEDED FOR WHEEZING FOR SHORTNESS OF BREATH 90 mL 0   albuterol  (VENTOLIN  HFA) 108 (90 Base) MCG/ACT inhaler INHALE 2 PUFFS BY MOUTH EVERY 6 HOURS AS NEEDED FOR WHEEZING OR SHORTNESS OF BREATH 9 g 1   apixaban  (ELIQUIS ) 5 MG TABS tablet Take 1 tablet by mouth twice daily 180 tablet 1   atorvastatin  (LIPITOR) 20 MG tablet Take 20 mg by mouth at bedtime.   5   clonazePAM  (KLONOPIN ) 1 MG tablet Take 1 mg by mouth at bedtime as needed (sleep).     doxycycline  (VIBRAMYCIN ) 100 MG capsule Take 100 mg by mouth 2 (two) times daily.     fluticasone  (FLONASE ) 50 MCG/ACT nasal spray Place 2 sprays into both nostrils daily. 16 g 2   gabapentin  (NEURONTIN ) 100 MG capsule Take 100-200 mg by mouth See admin instructions. Take 200 mg am & 100 mg at bedtime     guaiFENesin (MUCINEX) 600 MG 12 hr tablet Take 600 mg by mouth daily as needed (bronchitis).     HYDROcodone -acetaminophen  (NORCO/VICODIN) 5-325 MG tablet Take 1 tablet by mouth every 6 (six) hours as needed for moderate pain.     isosorbide  mononitrate (IMDUR ) 30 MG 24 hr tablet Take 1 tablet by mouth once daily 90 tablet 1   Magnesium 200 MG TABS Take 200 mg by mouth in the morning.     Multiple Vitamin  (MULTIVITAMIN WITH MINERALS) TABS tablet Take 1 tablet by mouth in the morning.     nitroGLYCERIN  (NITROSTAT ) 0.4 MG SL tablet Place 1 tablet (0.4 mg total) under the tongue every 5 (five) minutes as needed for chest pain. 25 tablet 3   Omega-3 Fatty Acids (FISH OIL) 1000 MG CAPS Take 1,000 mg by mouth in the morning.     omeprazole  (PRILOSEC) 20 MG capsule Take 1 capsule (20 mg total) by mouth daily. 30 capsule 3   Phenylephrine -DM-GG (ROBITUSSIN COUGH/COLD CF MAX PO) Take 20 mLs by mouth every 6 (six) hours as needed (cough).     Potassium Chloride  ER 20 MEQ TBCR Take 20 mEq by mouth in the morning.  STIOLTO RESPIMAT  2.5-2.5 MCG/ACT AERS INHALE 2 PUFFS BY MOUTH ONCE DAILY 4 g 5   tamsulosin  (FLOMAX ) 0.4 MG CAPS capsule Take 0.8 mg by mouth at bedtime.     tiZANidine  (ZANAFLEX ) 4 MG tablet Take 4 mg by mouth at bedtime as needed for muscle spasms.     torsemide (DEMADEX) 20 MG tablet Take 40 mg by mouth in the morning.     ciclopirox  (PENLAC ) 8 % solution Apply topically at bedtime. Apply over nail and surrounding skin. Apply daily over previous coat. After seven (7) days, may remove with alcohol and continue cycle. 6.6 mL 2   metoprolol  tartrate (LOPRESSOR ) 25 MG tablet Take 1 tablet (25 mg total) by mouth 2 (two) times daily. 180 tablet 3   No facility-administered medications prior to visit.    Allergies  Allergen Reactions   Morphine  Sulfate Itching   Rosuvastatin  Calcium  Other (See Comments)    Muscle weakness   Tramadol Itching    Other reaction(s): Unknown    Family History  Problem Relation Age of Onset   Lung cancer Mother    Heart disease Mother    Emphysema Father    Heart disease Father    Cancer Sister 18       unknown type   Breast cancer Sister        dx. >50   Colon cancer Sister        dx. >50   Breast cancer Sister        dx. >50   Colon cancer Sister        dx. >50   Colon cancer Cousin        dx. early 3s   Esophageal cancer Neg Hx    Rectal cancer  Neg Hx    Stomach cancer Neg Hx    Colon polyps Neg Hx     Social History   Tobacco Use   Smoking status: Former    Current packs/day: 0.00    Average packs/day: 1 pack/day for 45.0 years (45.0 ttl pk-yrs)    Types: Cigarettes    Start date: 10    Quit date: 12/18/1995    Years since quitting: 28.0   Smokeless tobacco: Never  Vaping Use   Vaping status: Never Used  Substance Use Topics   Alcohol use: No    Comment: quit in 1980   Drug use: No    ROS: As per history of present illness, otherwise negative  BP 110/76   Pulse 64   Ht 5' 11 (1.803 m)   Wt 237 lb 9.6 oz (107.8 kg)   BMI 33.14 kg/m  Gen: awake, alert, NAD HEENT: anicteric  CV: RRR, no mrg Pulm: CTA b/l Abd: soft, NT/ND, +BS throughout Ext: no c/c/e Neuro: nonfocal   RELEVANT LABS AND IMAGING: CBC    Component Value Date/Time   WBC 4.7 06/07/2023 2016   RBC 4.47 06/07/2023 2016   HGB 12.6 (L) 06/07/2023 2016   HGB 14.7 06/08/2021 0930   HCT 38.0 (L) 06/07/2023 2016   HCT 45.0 06/08/2021 0930   PLT 142 (L) 06/07/2023 2016   PLT 209 06/08/2021 0930   MCV 85.0 06/07/2023 2016   MCV 84 06/08/2021 0930   MCH 28.2 06/07/2023 2016   MCHC 33.2 06/07/2023 2016   RDW 15.0 06/07/2023 2016   RDW 14.7 06/08/2021 0930   LYMPHSABS 1.2 03/25/2021 1347   MONOABS 0.4 03/25/2021 1347   EOSABS 0.0 03/25/2021 1347   BASOSABS 0.0 03/25/2021 1347  CMP     Component Value Date/Time   NA 135 06/07/2023 2016   NA 140 10/11/2021 1332   K 3.8 06/07/2023 2016   CL 104 06/07/2023 2016   CO2 23 06/07/2023 2016   GLUCOSE 123 (H) 06/07/2023 2016   BUN 15 06/07/2023 2016   BUN 12 10/11/2021 1332   CREATININE 1.43 (H) 06/07/2023 2016   CREATININE 1.15 10/13/2012 0942   CALCIUM  8.7 (L) 06/07/2023 2016   PROT 7.2 03/25/2021 1306   ALBUMIN 3.7 03/25/2021 1306   AST 28 03/25/2021 1306   ALT 34 03/25/2021 1306   ALKPHOS 54 03/25/2021 1306   BILITOT 0.5 03/25/2021 1306   GFRNONAA 49 (L) 06/07/2023 2016    GFRAA >60 10/08/2018 0328       ASSESSMENT/PLAN:  History of Polyps plus family history of colon cancer Patient has a history of multiple polyps removed during previous colonoscopies. Last colonoscopy was in December 2021 with six polyps removed. Considering the patient's age and overall health, the decision to proceed with another colonoscopy is based on the potential risk of polyps becoming cancerous. -Schedule colonoscopy in approximately one month. -Notify Dr. Ladona to hold Eliquis  for 24-48 hours prior to the procedure.  Chronic anticoagulation Will hold Eliquis  2 days prior to endoscopic procedures - will instruct when and how to resume after procedure. Benefits and risks of procedure explained including risks of bleeding, perforation, infection, missed lesions, reactions to medications and possible need for hospitalization and surgery for complications. Additional rare but real risk of stroke or other vascular clotting events off Eliquis  also explained and need to seek urgent help if any signs of these problems occur. Will communicate by phone or EMR with patient's  prescribing provider to confirm that holding Eliquis  is reasonable in this case.   GERD  Stable and controlled on low-dose omeprazole  without alarm symptoms -Continue omeprazole  20 mg daily  Grief and Loss Recent loss of spouse. Patient reports having support from family. -Continue to monitor and provide emotional support as needed.   Rr:Yjmmpd, Elsie SAUNDERS, Md [address] [place],  [place] [address]

## 2024-01-01 NOTE — Telephone Encounter (Signed)
 Left message for patient to return my call.

## 2024-01-02 NOTE — Telephone Encounter (Signed)
Left message for patient to return my call.

## 2024-01-03 ENCOUNTER — Telehealth: Payer: Self-pay | Admitting: Emergency Medicine

## 2024-01-03 ENCOUNTER — Other Ambulatory Visit: Payer: Self-pay | Admitting: Emergency Medicine

## 2024-01-03 MED ORDER — ALBUTEROL SULFATE (2.5 MG/3ML) 0.083% IN NEBU: INHALATION_SOLUTION | RESPIRATORY_TRACT | 0 refills | Status: DC

## 2024-01-03 NOTE — Telephone Encounter (Signed)
Patient notified to hold Eliquis 48 hours prior to procedure. Patient verbalized understanding.

## 2024-01-03 NOTE — Telephone Encounter (Signed)
Patient needs albuterol solution. He is completely out. Next appointment is made in February.   [facility]: [facility] in [place]

## 2024-01-03 NOTE — Telephone Encounter (Signed)
I called and spoke with the pt. Pt complains of wheezing, sob, and sinus/chest congestion. The wheezing started 3 weeks ago. He complains of labored breaths in the morning when he wakes up, and at night when he sits down. Pt complains of sinus/chest congestion. Dry coughing, and has been unable to cough any mucous up. Pt saw his PCP and was prescribed Doxycycline yesterday. He states this is helping. Pt is asking for a refill on albuterol solution. Pt has not been seen since 2023, but has an upcoming appointment with Dr Delton Coombes in February. Pt does still have his Stiolto. I will grant enough solution to last until his appointment, but pt must keep his appointment for anything further. Pt verbalized understanding. Sending refill in. NFN

## 2024-02-05 ENCOUNTER — Telehealth: Payer: Self-pay | Admitting: Internal Medicine

## 2024-02-05 NOTE — Telephone Encounter (Signed)
PT is scheduled to take Sutab for his procedure but he would like to switch to the liquid. Requesting call back.

## 2024-02-06 NOTE — Telephone Encounter (Signed)
 Left message for patient to return my call.

## 2024-02-07 NOTE — Telephone Encounter (Signed)
 Left message for patient to return my call.

## 2024-02-10 NOTE — Telephone Encounter (Signed)
 Left message for patient to return my call.

## 2024-02-11 NOTE — Telephone Encounter (Signed)
 Will await return call.

## 2024-02-12 ENCOUNTER — Ambulatory Visit: Payer: HMO | Admitting: Emergency Medicine

## 2024-02-12 ENCOUNTER — Encounter: Payer: Self-pay | Admitting: Emergency Medicine

## 2024-02-12 VITALS — BP 116/76 | HR 71 | Temp 98.4°F | Ht 71.0 in | Wt 241.8 lb

## 2024-02-12 DIAGNOSIS — J449 Chronic obstructive pulmonary disease, unspecified: Secondary | ICD-10-CM | POA: Diagnosis not present

## 2024-02-12 DIAGNOSIS — G4733 Obstructive sleep apnea (adult) (pediatric): Secondary | ICD-10-CM | POA: Diagnosis not present

## 2024-02-12 MED ORDER — ALBUTEROL SULFATE HFA 108 (90 BASE) MCG/ACT IN AERS: 2.0000 | INHALATION_SPRAY | Freq: Four times a day (QID) | RESPIRATORY_TRACT | 6 refills | Status: AC | PRN

## 2024-02-12 NOTE — Patient Instructions (Signed)
 Please continue to keep your albuterol available to use either 2 puffs or 1 nebulizer treatment up to every 4 hours when you needed for shortness of breath, chest tightness, wheezing, spells of coughing. We will hold off on restarting a scheduled inhaler medication for now.  We could reconsider this in the future depending on how your symptoms are doing Flu shot, COVID-19 vaccine and RSV vaccine are all up-to-date We could consider referring you back for sleep evaluation and restarting CPAP at some point in the future depending on how your symptoms are doing Follow with Dr. Delton Coombes in 12 months or sooner if you have any problems.

## 2024-02-12 NOTE — Assessment & Plan Note (Signed)
 Discussed pros and cons of treating his OSA with him today.  He wants to hold off for now.  He was initially interested in the inspire device but it does not sound like his insurance was going to cover this.

## 2024-02-12 NOTE — Progress Notes (Signed)
 Subjective:    Patient ID: [name], male    DOB: 11-29-1941, 83 y.o.   MRN: [id number]  Shortness of Breath Pertinent negatives include no congestion, coughing, fatigue, fever, headaches, joint swelling, myalgias, nausea, rash, sore throat or vomiting.    ROV 02/12/2024 --83 year old man whom I have followed for mild COPD, OSA not on CPAP, allergic rhinitis.  He also has a history of CAD/CABG, A-fib, colon cancer (remote), hyperlipidemia.  As of the last saw him he was not on scheduled inhaled medication (previously Stiolto). He reports that he unfortunately lost his wife since last visit. He does stay active but will reliably get SOB w hills or a long walk. He can can garden some. He is able to shop. Minimal cough. Uses albuterol HFA often before bedtime. He uses albuterol nebs prn, not every day. Cough and sputum will prompt him to use. Also occasionally SOB. He had URI 6 weeks ago - had to use his albuterol bid at that. He had a flare in December that required him to take doxycycline.   Flu shot, RSV and covid vaccines all up to date.  Not currently on CPAP, not ready to get back on this    Review of Systems  Constitutional:  Negative for fatigue, fever and unexpected weight change.  HENT:  Negative for congestion, dental problem, ear pain, nosebleeds, postnasal drip, rhinorrhea, sinus pressure, sneezing, sore throat and trouble swallowing.   Eyes:  Negative for redness and itching.  Respiratory:  Positive for shortness of breath. Negative for cough, chest tightness and wheezing.   Cardiovascular:  Negative for palpitations and leg swelling.  Gastrointestinal:  Negative for nausea and vomiting.  Genitourinary:  Negative for dysuria.  Musculoskeletal:  Negative for joint swelling and myalgias.  Skin:  Negative for rash.  Neurological:  Negative for headaches.  Hematological:  Does not bruise/bleed easily.  Psychiatric/Behavioral:  Negative for dysphoric mood. The patient is not  nervous/anxious.        Objective:   Physical Exam Vitals:   02/12/24 1548  BP: 116/76  Pulse: 71  Temp: 98.4 F (36.9 C)  TempSrc: Oral  SpO2: 97%  Weight: 241 lb 12.8 oz (109.7 kg)  Height: 5\' 11"  (1.803 m)    Gen: Pleasant, obese, in no distress,  normal affect  ENT: No lesions,  mouth clear,  oropharynx clear, no postnasal drip  Neck: No JVD, no stridor  Lungs: No use of accessory muscles, no wheeze, he does have some upper airway noise and some end expiratory wheezing on a forced expiration  Cardiovascular:  regular, no M. Trace ankle edema.   Musculoskeletal: amputation R index finger, no cyanosis or clubbing  Neuro: alert, non focal  Skin: Warm, no lesions or rashes     Assessment & Plan:  COPD (chronic obstructive pulmonary disease) (HCC) Discussed his symptoms.  He has intermittent symptoms, does not require albuterol nebs every day.  Usually uses his HFA at least once in the evening.  We talked about possibly restarting scheduled BD therapy and he wants to hold off.  I think this is reasonable.  He did have a flare in December that required antibiotics.  We can reconsider restarting scheduled therapy depending on how symptoms evolve.  Depending on his frequency of flaring may need to consider ICS.  His vaccines are up-to-date.  Obstructive sleep apnea Discussed pros and cons of treating his OSA with him today.  He wants to hold off for now.  He was initially  interested in the inspire device but it does not sound like his insurance was going to cover this.      Levy Pupa, MD, PhD 02/12/2024, 4:13 PM [facility] [phone] or if no answer [phone]

## 2024-02-12 NOTE — Addendum Note (Signed)
 Addended by: Maisie Fus on: 02/12/2024 04:20 PM   Modules accepted: Orders

## 2024-02-12 NOTE — Assessment & Plan Note (Signed)
 Discussed his symptoms.  He has intermittent symptoms, does not require albuterol nebs every day.  Usually uses his HFA at least once in the evening.  We talked about possibly restarting scheduled BD therapy and he wants to hold off.  I think this is reasonable.  He did have a flare in December that required antibiotics.  We can reconsider restarting scheduled therapy depending on how symptoms evolve.  Depending on his frequency of flaring may need to consider ICS.  His vaccines are up-to-date.

## 2024-02-14 ENCOUNTER — Other Ambulatory Visit: Payer: Self-pay | Admitting: Cardiology

## 2024-02-17 MED ORDER — NA SULFATE-K SULFATE-MG SULF 17.5-3.13-1.6 GM/177ML PO SOLN: 1.0000 | Freq: Once | ORAL | 0 refills | Status: AC

## 2024-02-17 NOTE — Addendum Note (Signed)
 Addended by: Illene Bolus on: 02/17/2024 02:17 PM   Modules accepted: Orders

## 2024-02-17 NOTE — Telephone Encounter (Signed)
 Patient called and stated that his procedure is for March 11 th and stated that he has not received a call from the nurse regarding his prep medication. Patient is requesting a call back. Please advise.

## 2024-02-17 NOTE — Telephone Encounter (Signed)
 Patient states he does not want to pick up the Sutab due to price. Switched patient to Suprep and the the prescription was sent to his pharmacy. Informed patient he will have to come by the office to pick up his new instructions. Patient agreed and will come by today.

## 2024-02-19 ENCOUNTER — Telehealth: Payer: Self-pay | Admitting: Cardiology

## 2024-02-19 DIAGNOSIS — I4819 Other persistent atrial fibrillation: Secondary | ICD-10-CM

## 2024-02-19 NOTE — Telephone Encounter (Signed)
 Pt c/o medication issue:  1. Name of Medication:  metoprolol tartrate (LOPRESSOR) 25 MG tablet   2. How are you currently taking this medication (dosage and times per day)?  Take 1 tablet (25 mg total) by mouth 2 (two) times daily.   3. Are you having a reaction (difficulty breathing--STAT)? No  4. What is your medication issue? Pt is requesting a callback regarding him requesting a refill on this medication but it hadn't been done so he'd like to know if he should still be on this medication or not. Please advise

## 2024-02-19 NOTE — Telephone Encounter (Signed)
 Per last office note-- 1. Paroxysmal atrial fibrillation [facility]) Patient is maintaining sinus rhythm.  He remains asymptomatic without recurrence of palpitations.  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  His metoprolol dose was reduced but he has increased it back as his heart rate had increased to 100 bpm at home.  Advised to continue present medications. Metoprolol tartrate (LOPRESSOR) 25 MG tablet, Take 1 tablet (25 mg total) by mouth 2 (two) times daily was on list at last office visit but is no longer on medication list.  I placed call to patient to and left message to call office

## 2024-02-20 NOTE — Telephone Encounter (Signed)
 I certainly did not intend to discontinue this If we did it was inadvertent Can be resumed if this is recommended by cardiology or primary care

## 2024-02-20 NOTE — Telephone Encounter (Signed)
 Pt returning call, requesting cb

## 2024-02-20 NOTE — Telephone Encounter (Signed)
 Yes please

## 2024-02-20 NOTE — Telephone Encounter (Signed)
 Spoke with patient and he states he went to refill his metoprolol and he was informed it was no longer on medication. Last visit with cardiology patient should still be taking metoprolol.  Med list shows metoprolol was d/c in January 2025 at [facility]. I will reach out to [facility].

## 2024-02-20 NOTE — Telephone Encounter (Signed)
 Yes

## 2024-02-21 MED ORDER — METOPROLOL TARTRATE 25 MG PO TABS: 25.0000 mg | ORAL_TABLET | Freq: Two times a day (BID) | ORAL | 3 refills | Status: DC

## 2024-02-21 NOTE — Telephone Encounter (Signed)
 Spoke with patient and he is aware RX has been sent to pharmacy

## 2024-02-23 ENCOUNTER — Encounter: Payer: Self-pay | Admitting: Certified Registered Nurse Anesthetist

## 2024-02-25 ENCOUNTER — Ambulatory Visit: Payer: HMO | Admitting: Internal Medicine

## 2024-02-25 ENCOUNTER — Encounter: Payer: Self-pay | Admitting: Internal Medicine

## 2024-02-25 VITALS — BP 106/79 | HR 82 | Temp 97.8°F | Resp 14 | Ht 71.0 in | Wt 237.9 lb

## 2024-02-25 DIAGNOSIS — D122 Benign neoplasm of ascending colon: Secondary | ICD-10-CM | POA: Diagnosis not present

## 2024-02-25 DIAGNOSIS — J449 Chronic obstructive pulmonary disease, unspecified: Secondary | ICD-10-CM | POA: Diagnosis not present

## 2024-02-25 DIAGNOSIS — D123 Benign neoplasm of transverse colon: Secondary | ICD-10-CM | POA: Diagnosis not present

## 2024-02-25 DIAGNOSIS — Z1211 Encounter for screening for malignant neoplasm of colon: Secondary | ICD-10-CM | POA: Diagnosis not present

## 2024-02-25 DIAGNOSIS — D125 Benign neoplasm of sigmoid colon: Secondary | ICD-10-CM | POA: Diagnosis not present

## 2024-02-25 DIAGNOSIS — Z860101 Personal history of adenomatous and serrated colon polyps: Secondary | ICD-10-CM | POA: Diagnosis not present

## 2024-02-25 DIAGNOSIS — Z860100 Personal history of colon polyps, unspecified: Secondary | ICD-10-CM | POA: Diagnosis not present

## 2024-02-25 DIAGNOSIS — K648 Other hemorrhoids: Secondary | ICD-10-CM

## 2024-02-25 DIAGNOSIS — K5730 Diverticulosis of large intestine without perforation or abscess without bleeding: Secondary | ICD-10-CM | POA: Diagnosis not present

## 2024-02-25 DIAGNOSIS — D124 Benign neoplasm of descending colon: Secondary | ICD-10-CM

## 2024-02-25 DIAGNOSIS — I2510 Atherosclerotic heart disease of native coronary artery without angina pectoris: Secondary | ICD-10-CM | POA: Diagnosis not present

## 2024-02-25 DIAGNOSIS — Z85038 Personal history of other malignant neoplasm of large intestine: Secondary | ICD-10-CM

## 2024-02-25 DIAGNOSIS — Z800 Family history of malignant neoplasm of digestive organs: Secondary | ICD-10-CM | POA: Diagnosis not present

## 2024-02-25 DIAGNOSIS — G4733 Obstructive sleep apnea (adult) (pediatric): Secondary | ICD-10-CM | POA: Diagnosis not present

## 2024-02-25 MED ORDER — SODIUM CHLORIDE 0.9 % IV SOLN: 500.0000 mL | Freq: Once | INTRAVENOUS | Status: DC

## 2024-02-25 NOTE — Progress Notes (Unsigned)
 Report given to PACU, vss

## 2024-02-25 NOTE — Progress Notes (Signed)
 Called to room to assist during endoscopic procedure.  Patient ID and intended procedure confirmed with present staff. Received instructions for my participation in the procedure from the performing physician.

## 2024-02-25 NOTE — Progress Notes (Unsigned)
 GASTROENTEROLOGY PROCEDURE H&P NOTE   Primary Care Physician: Noberto Retort, MD    Reason for Procedure:   Hx of polyps and fam hx of colon cancer  Plan:    colonoscopy  Patient is appropriate for endoscopic procedure(s) in the ambulatory ([facility]) setting.  The nature of the procedure, as well as the risks, benefits, and alternatives were carefully and thoroughly reviewed with the patient. Ample time for discussion and questions allowed. The patient understood, was satisfied, and agreed to proceed.     HPI: [name] is a 83 y.o. male who presents for colonoscopy.  Medical history as below.  Tolerated the prep.  No recent chest pain or shortness of breath.  No abdominal pain today.  Eliquis held x 2 days.  Past Medical History:  Diagnosis Date   Allergy    Arthritis    BPH (benign prostatic hyperplasia)    CAD (coronary artery disease)    s/p CABG 2007; NSTEMI in setting of AFib with RVR 12/2009; cath 1/11: S-RCA ok with prox 30-40% and 50% stenoses; S-OM and RI  with patent OM limb but an occluded RI limb; S-D2 and L-LAD ok; EF 55%   Cataract    removed both eyes    Clotting disorder (HCC)    s/p CABG x 5- PE    Colon cancer (HCC) 1992   COPD (chronic obstructive pulmonary disease) (HCC)    Diverticulosis    Essential hypertension    Family history of breast cancer    Family history of colon cancer    Family history of lung cancer    GERD (gastroesophageal reflux disease)    History of pulmonary embolus (PE) 2007   Following CABG   Hyperlipidemia    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HCC)    Right upper   Myocardial infarction (HCC) 2007   NSTEMI, one stent, A fib, blood thinner   OSA on CPAP    Paroxysmal atrial fibrillation (HCC)    Previously on Multaq, declines anticoagulation   Personal history of colonic polyps 01/22/2000   Tubular adenoma   RBBB (right bundle branch block)    Sleep apnea    no cpap    Squamous cell carcinoma of skin 02/11/2008   Bowens-Left  paraspinal, lower    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   in situ-left forearm   Stroke (HCC) 2024   MRI showed had one in the past    Past Surgical History:  Procedure Laterality Date   CARDIOVERSION N/A 05/23/2021   Procedure: CARDIOVERSION;  Surgeon: Elder Negus, MD;  Location: [facility]OS[facility];  Service: Cardiovascular;  Laterality: N/A;   COLECTOMY  1992   w/ colostomy  related to colon cancer   COLON SURGERY  1993   to take down Colostomy   COLONOSCOPY  09/18/2020   CORONARY ARTERY BYPASS GRAFT  2007   CORONARY STENT INTERVENTION N/A 10/07/2018   Procedure: CORONARY STENT INTERVENTION;  Surgeon: Yates Decamp, MD;  Location: [facility] INVASIVE CV LAB;  Service: Cardiovascular;  Laterality: N/A;   HAND LIGAMENT RECONSTRUCTION  1963 1964   tendon repair and nerve; left   INGUINAL HERNIA REPAIR  2009   LAPAROSCOPIC CHOLECYSTECTOMY  2004   PATENT FORAMEN OVALE(PFO) CLOSURE N/A 06/13/2021   Procedure: PATENT FORAMEN OVALE (PFO) CLOSURE;  Surgeon: Yates Decamp, MD;  Location: [facility] INVASIVE CV LAB;  Service: Cardiovascular;  Laterality: N/A;   POLYPECTOMY     RIGHT HEART CATH N/A 06/13/2021   Procedure: RIGHT HEART CATH;  Surgeon: Yates Decamp, MD;  Location: [facility] INVASIVE CV LAB;  Service: Cardiovascular;  Laterality: N/A;   RIGHT/LEFT HEART CATH AND CORONARY/GRAFT ANGIOGRAPHY N/A 07/03/2017   Procedure: Right/Left Heart Cath and Coronary/Graft Angiography;  Surgeon: Marykay Lex, MD;  Location: [facility] INVASIVE CV LAB;  Service: Cardiovascular;  Laterality: N/A;   RIGHT/LEFT HEART CATH AND CORONARY/GRAFT ANGIOGRAPHY N/A 10/07/2018   Procedure: RIGHT/LEFT HEART CATH AND CORONARY/GRAFT ANGIOGRAPHY;  Surgeon: Yates Decamp, MD;  Location: [facility] INVASIVE CV LAB;  Service: Cardiovascular;  Laterality: N/A;   TEE WITHOUT CARDIOVERSION  05/23/2021   Procedure: TRANSESOPHAGEAL ECHOCARDIOGRAM (TEE);  Surgeon: Elder Negus, MD;  Location: [facility] ENDOSCOPY;  Service: Cardiovascular;;   UMBILICAL  HERNIA REPAIR     VENTRAL HERNIA REPAIR  2009    Prior to Admission medications   Medication Sig Start Date End Date Taking? Authorizing Provider  albuterol (PROVENTIL) (2.5 MG/3ML) 0.083% nebulizer solution USE 1 VIAL IN NEBULIZER EVERY 6 HOURS AS NEEDED FOR WHEEZING FOR SHORTNESS OF BREATH 01/03/24  Yes Leslye Peer, MD  atorvastatin (LIPITOR) 20 MG tablet Take 20 mg by mouth at bedtime.  03/31/18  Yes [provider]  fluticasone (FLONASE) 50 MCG/ACT nasal spray Place 2 sprays into both nostrils daily. 01/22/19  Yes Lupita Leash, MD  gabapentin (NEURONTIN) 300 MG capsule Take 300 mg by mouth 2 (two) times daily. 12/28/23  Yes [provider]  HYDROcodone-acetaminophen (NORCO/VICODIN) 5-325 MG tablet Take 1 tablet by mouth every 6 (six) hours as needed for moderate pain.   Yes [provider]  isosorbide mononitrate (IMDUR) 30 MG 24 hr tablet Take 1 tablet by mouth once daily 04/14/19  Yes Yates Decamp, MD  Magnesium 200 MG TABS Take 200 mg by mouth in the morning.   Yes [provider]  metoprolol tartrate (LOPRESSOR) 25 MG tablet Take 1 tablet (25 mg total) by mouth 2 (two) times daily. 02/21/24 02/20/25 Yes Yates Decamp, MD  Multiple Vitamin (MULTIVITAMIN WITH MINERALS) TABS tablet Take 1 tablet by mouth in the morning.   Yes [provider]  Omega-3 Fatty Acids (FISH OIL) 1000 MG CAPS Take 1,000 mg by mouth in the morning.   Yes [provider]  omeprazole (PRILOSEC) 20 MG capsule Take 1 capsule (20 mg total) by mouth daily. 03/26/21  Yes Emokpae, Courage, MD  Potassium Chloride ER 20 MEQ TBCR Take 20 mEq by mouth in the morning. 01/12/21  Yes [provider]  potassium chloride SA (KLOR-CON M) 20 MEQ tablet Take 20 mEq by mouth daily. 02/06/24  Yes [provider]  tamsulosin (FLOMAX) 0.4 MG CAPS capsule Take 0.8 mg by mouth at bedtime.   Yes [provider]  torsemide (DEMADEX) 20 MG tablet Take 40 mg by mouth in the  morning. 01/26/21  Yes [provider]  albuterol (VENTOLIN HFA) 108 (90 Base) MCG/ACT inhaler Inhale 2 puffs into the lungs every 6 (six) hours as needed for wheezing or shortness of breath. 02/12/24   Byrum, Les Pou, MD  apixaban (ELIQUIS) 5 MG TABS tablet Take 1 tablet by mouth twice daily 10/21/23   Yates Decamp, MD  clonazePAM (KLONOPIN) 1 MG tablet Take 1 mg by mouth at bedtime as needed (sleep). 08/20/20   [provider]  guaiFENesin (MUCINEX) 600 MG 12 hr tablet Take 600 mg by mouth daily as needed (bronchitis).    [provider]  nitroGLYCERIN (NITROSTAT) 0.4 MG SL tablet Place 1 tablet (0.4 mg total) under the tongue every 5 (five)  minutes as needed for chest pain. 11/06/22   Yates Decamp, MD  Phenylephrine-DM-GG (ROBITUSSIN COUGH/COLD CF MAX PO) Take 20 mLs by mouth every 6 (six) hours as needed (cough).    [provider]  tiZANidine (ZANAFLEX) 4 MG tablet Take 4 mg by mouth at bedtime as needed for muscle spasms.    [provider]    Current Outpatient Medications  Medication Sig Dispense Refill   albuterol (PROVENTIL) (2.5 MG/3ML) 0.083% nebulizer solution USE 1 VIAL IN NEBULIZER EVERY 6 HOURS AS NEEDED FOR WHEEZING FOR SHORTNESS OF BREATH 90 mL 0   atorvastatin (LIPITOR) 20 MG tablet Take 20 mg by mouth at bedtime.   5   fluticasone (FLONASE) 50 MCG/ACT nasal spray Place 2 sprays into both nostrils daily. 16 g 2   gabapentin (NEURONTIN) 300 MG capsule Take 300 mg by mouth 2 (two) times daily.     HYDROcodone-acetaminophen (NORCO/VICODIN) 5-325 MG tablet Take 1 tablet by mouth every 6 (six) hours as needed for moderate pain.     isosorbide mononitrate (IMDUR) 30 MG 24 hr tablet Take 1 tablet by mouth once daily 90 tablet 1   Magnesium 200 MG TABS Take 200 mg by mouth in the morning.     metoprolol tartrate (LOPRESSOR) 25 MG tablet Take 1 tablet (25 mg total) by mouth 2 (two) times daily. 180 tablet 3   Multiple Vitamin (MULTIVITAMIN WITH  MINERALS) TABS tablet Take 1 tablet by mouth in the morning.     Omega-3 Fatty Acids (FISH OIL) 1000 MG CAPS Take 1,000 mg by mouth in the morning.     omeprazole (PRILOSEC) 20 MG capsule Take 1 capsule (20 mg total) by mouth daily. 30 capsule 3   Potassium Chloride ER 20 MEQ TBCR Take 20 mEq by mouth in the morning.     potassium chloride SA (KLOR-CON M) 20 MEQ tablet Take 20 mEq by mouth daily.     tamsulosin (FLOMAX) 0.4 MG CAPS capsule Take 0.8 mg by mouth at bedtime.     torsemide (DEMADEX) 20 MG tablet Take 40 mg by mouth in the morning.     albuterol (VENTOLIN HFA) 108 (90 Base) MCG/ACT inhaler Inhale 2 puffs into the lungs every 6 (six) hours as needed for wheezing or shortness of breath. 8 g 6   apixaban (ELIQUIS) 5 MG TABS tablet Take 1 tablet by mouth twice daily 180 tablet 1   clonazePAM (KLONOPIN) 1 MG tablet Take 1 mg by mouth at bedtime as needed (sleep).     guaiFENesin (MUCINEX) 600 MG 12 hr tablet Take 600 mg by mouth daily as needed (bronchitis).     nitroGLYCERIN (NITROSTAT) 0.4 MG SL tablet Place 1 tablet (0.4 mg total) under the tongue every 5 (five) minutes as needed for chest pain. 25 tablet 3   Phenylephrine-DM-GG (ROBITUSSIN COUGH/COLD CF MAX PO) Take 20 mLs by mouth every 6 (six) hours as needed (cough).     tiZANidine (ZANAFLEX) 4 MG tablet Take 4 mg by mouth at bedtime as needed for muscle spasms.     Current Facility-Administered Medications  Medication Dose Route Frequency Provider Last Rate Last Admin   0.9 %  sodium chloride infusion  500 mL Intravenous Once Alianna Wurster, Carie Caddy, MD        Allergies as of 02/25/2024 - Review Complete 02/25/2024  Allergen Reaction Noted   Morphine sulfate Itching 03/29/2021   Rosuvastatin calcium Other (See Comments) 03/29/2021   Tramadol Itching 03/29/2021    Family History  Problem Relation Age of Onset   Lung cancer Mother    Heart disease Mother    Emphysema Father    Heart disease Father    Cancer Sister 62        unknown type   Breast cancer Sister        dx. >50   Colon cancer Sister        dx. >50   Breast cancer Sister        dx. >50   Colon cancer Sister        dx. >50   Colon cancer Cousin        dx. early 69s   Esophageal cancer Neg Hx    Rectal cancer Neg Hx    Stomach cancer Neg Hx    Colon polyps Neg Hx     Social History   Socioeconomic History   Marital status: Married    Spouse name: Not on file   Number of children: 4   Years of education: Not on file   Highest education level: Not on file  Occupational History   Occupation: Retired    Comment: Counselling psychologist   Tobacco Use   Smoking status: Former    Current packs/day: 0.00    Average packs/day: 1 pack/day for 45.0 years (45.0 ttl pk-yrs)    Types: Cigarettes    Start date: 55    Quit date: 12/18/1995    Years since quitting: 28.2   Smokeless tobacco: Never  Vaping Use   Vaping status: Never Used  Substance and Sexual Activity   Alcohol use: No    Comment: quit in 1980   Drug use: No   Sexual activity: Not Currently  Other Topics Concern   Not on file  Social History Narrative   Daily caffeine    Social Drivers of Health   Financial Resource Strain: Not on file  Food Insecurity: No Food Insecurity (08/09/2022)   Hunger Vital Sign    Worried About Running Out of Food in the Last Year: Never true    Ran Out of Food in the Last Year: Never true  Transportation Needs: No Transportation Needs (08/09/2022)   PRAPARE - Administrator, Civil Service (Medical): No    Lack of Transportation (Non-Medical): No  Physical Activity: Not on file  Stress: Not on file  Social Connections: Not on file  Intimate Partner Violence: Not on file    Physical Exam: Vital signs in last 24 hours: @BP  (!) 158/78   Pulse 84   Temp 97.8 F (36.6 C) (Temporal)   Ht 5\' 11"  (1.803 m)   Wt 237 lb 14.4 oz (107.9 kg)   SpO2 96%   BMI 33.18 kg/m  GEN: NAD EYE: Sclerae anicteric ENT: MMM CV:  Non-tachycardic Pulm: CTA b/l GI: Soft, NT/ND NEURO:  Alert & Oriented x 3   Erick Blinks, MD [facility]  02/25/2024 11:12 AM

## 2024-02-25 NOTE — Patient Instructions (Addendum)
 Handouts Provided:  Polyps and Diverticulosis  RESUME Eliquis at prior dose tomorrow.   YOU HAD AN ENDOSCOPIC PROCEDURE TODAY AT THE [facility]:   Refer to the procedure report that was given to you for any specific questions about what was found during the examination.  If the procedure report does not answer your questions, please call your gastroenterologist to clarify.  If you requested that your care partner not be given the details of your procedure findings, then the procedure report has been included in a sealed envelope for you to review at your convenience later.  YOU SHOULD EXPECT: Some feelings of bloating in the abdomen. Passage of more gas than usual.  Walking can help get rid of the air that was put into your GI tract during the procedure and reduce the bloating. If you had a lower endoscopy (such as a colonoscopy or flexible sigmoidoscopy) you may notice spotting of blood in your stool or on the toilet paper. If you underwent a bowel prep for your procedure, you may not have a normal bowel movement for a few days.  Please Note:  You might notice some irritation and congestion in your nose or some drainage.  This is from the oxygen used during your procedure.  There is no need for concern and it should clear up in a day or so.  SYMPTOMS TO REPORT IMMEDIATELY:  Following lower endoscopy (colonoscopy or flexible sigmoidoscopy):  Excessive amounts of blood in the stool  Significant tenderness or worsening of abdominal pains  Swelling of the abdomen that is new, acute  Fever of 100F or higher  For urgent or emergent issues, a gastroenterologist can be reached at any hour by calling [phone]. Do not use MyChart messaging for urgent concerns.    DIET:  We do recommend a small meal at first, but then you may proceed to your regular diet.  Drink plenty of fluids but you should avoid alcoholic beverages for 24 hours.  ACTIVITY:  You should plan to take it easy for  the rest of today and you should NOT DRIVE or use heavy machinery until tomorrow (because of the sedation medicines used during the test).    FOLLOW UP: Our staff will call the number listed on your records the next business day following your procedure.  We will call around 7:15- 8:00 am to check on you and address any questions or concerns that you may have regarding the information given to you following your procedure. If we do not reach you, we will leave a message.     If any biopsies were taken you will be contacted by phone or by letter within the next 1-3 weeks.  Please call us at [phone] if you have not heard about the biopsies in 3 weeks.    SIGNATURES/CONFIDENTIALITY: You and/or your care partner have signed paperwork which will be entered into your electronic medical record.  These signatures attest to the fact that that the information above on your After Visit Summary has been reviewed and is understood.  Full responsibility of the confidentiality of this discharge information lies with you and/or your care-partner.

## 2024-02-25 NOTE — Op Note (Signed)
 [facility] Patient Name: [name] Procedure Date: 02/25/2024 11:17 AM MRN: [id number] Endoscopist: Beverley Fiedler , MD, [id number] Age: 83 Referring MD:  Date of Birth: Sep 20, 1941 Gender: Male Account #: [id number] Procedure:                Colonoscopy Indications:              High risk colon cancer surveillance: Personal                            history of multiple adenomas, Family history of                            colon cancer in a first-degree, Last colonoscopy:                            December 2021 (TA x 6), December 2020 (TA x 15) Medicines:                Monitored Anesthesia Care Procedure:                Pre-Anesthesia Assessment:                           - Prior to the procedure, a History and Physical                            was performed, and patient medications and                            allergies were reviewed. The patient's tolerance of                            previous anesthesia was also reviewed. The risks                            and benefits of the procedure and the sedation                            options and risks were discussed with the patient.                            All questions were answered, and informed consent                            was obtained. Prior Anticoagulants: The patient has                            taken Eliquis (apixaban), last dose was 2 days                            prior to procedure. ASA Grade Assessment: III - A                            patient with severe systemic disease. After  reviewing the risks and benefits, the patient was                            deemed in satisfactory condition to undergo the                            procedure.                           After obtaining informed consent, the colonoscope                            was passed under direct vision. Throughout the                            procedure, the patient's blood pressure, pulse,  and                            oxygen saturations were monitored continuously. The                            Colonoscope was introduced through the anus and                            advanced to the cecum, identified by appendiceal                            orifice and ileocecal valve. The colonoscopy was                            performed without difficulty. The patient tolerated                            the procedure well. The quality of the bowel                            preparation was good. The ileocecal valve,                            appendiceal orifice, and rectum were photographed. Scope In: 11:21:40 AM Scope Out: 11:42:09 AM Scope Withdrawal Time: 0 hours 17 minutes 46 seconds  Total Procedure Duration: 0 hours 20 minutes 29 seconds  Findings:                 The digital rectal exam was normal.                           A 2 mm polyp was found in the ascending colon. The                            polyp was sessile. The polyp was removed with a                            cold biopsy forceps. Resection and retrieval were  complete.                           Four sessile polyps were found in the transverse                            colon. The polyps were 4 to 8 mm in size. These                            polyps were removed with a cold snare. Resection                            and retrieval were complete.                           A 6 mm polyp was found in the descending colon. The                            polyp was sessile. The polyp was removed with a                            cold snare. Resection and retrieval were complete.                           A 4 mm polyp was found in the sigmoid colon. The                            polyp was sessile. The polyp was removed with a                            cold snare. Resection and retrieval were complete.                           Multiple large-mouthed, medium-mouthed and                             small-mouthed diverticula were found from sigmoid                            to ascending colon.                           Internal hemorrhoids were found during                            retroflexion. The hemorrhoids were small. Complications:            No immediate complications. Estimated Blood Loss:     Estimated blood loss was minimal. Impression:               - One 2 mm polyp in the ascending colon, removed                            with a cold biopsy forceps. Resected and retrieved.                           -  Four 4 to 8 mm polyps in the transverse colon,                            removed with a cold snare. Resected and retrieved.                           - One 6 mm polyp in the descending colon, removed                            with a cold snare. Resected and retrieved.                           - One 4 mm polyp in the sigmoid colon, removed with                            a cold snare. Resected and retrieved.                           - Severe diverticulosis from sigmoid to ascending                            colon.                           - Internal hemorrhoids. Recommendation:           - Patient has a contact number available for                            emergencies. The signs and symptoms of potential                            delayed complications were discussed with the                            patient. Return to normal activities tomorrow.                            Written discharge instructions were provided to the                            patient.                           - Resume previous diet.                           - Continue present medications.                           - Resume Eliquis (apixaban) at prior dose tomorrow.                            Refer to managing physician for further adjustment  of therapy.                           - Await pathology results.                           - No recommendation  at this time regarding repeat                            colonoscopy due to age (interval would be 3 years                            and we can discuss risk versus benefit or repeat                            colonoscopy at that time). Beverley Fiedler, MD 02/25/2024 11:55:01 AM This report has been signed electronically.

## 2024-02-25 NOTE — Progress Notes (Unsigned)
 Updated medical record.

## 2024-02-26 ENCOUNTER — Telehealth: Payer: Self-pay

## 2024-02-26 NOTE — Telephone Encounter (Signed)
  Follow up Call-     02/25/2024   10:31 AM  Call back number  Post procedure Call Back phone  # [phone]  Permission to leave phone message Yes     Patient questions:  Do you have a fever, pain , or abdominal swelling? No. Pain Score  0 *  Have you tolerated food without any problems? Yes.    Have you been able to return to your normal activities? Yes.    Do you have any questions about your discharge instructions: Diet   No. Medications  No. Follow up visit  No.  Do you have questions or concerns about your Care? No.  Actions: * If pain score is 4 or above: No action needed, pain <4.

## 2024-02-27 LAB — SURGICAL PATHOLOGY

## 2024-03-02 ENCOUNTER — Encounter: Payer: Self-pay | Admitting: Internal Medicine

## 2024-04-18 ENCOUNTER — Other Ambulatory Visit: Payer: Self-pay | Admitting: Cardiology

## 2024-04-20 NOTE — Progress Notes (Signed)
 Cardiology Office Note:  .   Date:  04/30/2024  ID:  [name], DOB 1941/03/17, MRN [id number] PCP: Roselind Congo, MD  [facility] Providers Cardiologist:  Knox Perl, MD   History of Present Illness: .   [name] is a 83 y.o. male  with CAD S/P  CABG in 2007 in the setting of non-STEMI in atrial fibrillation with RVR. He has a history of  45 pack year h/o smoking tobacco, ascending aortic aneurysm (follows Dr.Bryan Bartle), small abdominal aortic aneurysm noted in 2015 (3cm), H/O colon cancer, in remission since 1992, GERD, hyperlipidemia, obstructive sleep apnea now on CPAP.    Patient hospitalized 03/2021 with recurrence of atrial flutter and echocardiogram revealed new onset LVEF 35-40% with global hypokinesis. Patient underwent successful cardioversion 05/23/2021 with return to normal sinus rhythm.  TEE prior to cardioversion noted ASD, which was subsequently repaired 06/13/2021.  Discussed the use of AI scribe software for clinical note transcription with the patient, who gave verbal consent to proceed.  History of Present Illness [name] is an 83 year old male with atrial fibrillation and coronary artery disease who presents with episodes of feeling chilled and spaced out. He experiences episodes of feeling chilled and a perception of significant slowing of his heart rate, occurring twice last week. During these episodes, he feels as though 'everything drains' and becomes spaced out, without chest pain. His heart rate varies from the fifties to the hundreds.  He is on metoprolol  25 mg twice daily, Eliquis  for atrial fibrillation, and atorvastatin  20 mg daily. He takes a diuretic twice daily to manage foot swelling, which occurs if doses are missed.  Coronary artery disease is present, with no stents required since 2019 and no chest pain. He manages blood pressure with careful salt intake.   Labs   External Labs:  Labs 03/11/2024:  TSH normal at 2.42. A1c  6.4%.  Hb 14.6/HCT 44.5, platelets 169.  Serum glucose 81 mg, BUN 15, creatinine 1.16, EGFR 62 mL, sodium 139, potassium 3.9.  LFTs normal.  Total cholesterol 126, triglycerides 170, HDL 38, LDL 60.  ROS  Review of Systems  Cardiovascular:  Positive for near-syncope. Negative for chest pain, dyspnea on exertion and leg swelling.    Physical Exam:   VS:  BP 128/73 (BP Location: Left Arm, Patient Position: Sitting, Cuff Size: Large)   Pulse (!) 57   Resp 16   Ht 5\' 11"  (1.803 m)   Wt 239 lb (108.4 kg)   SpO2 96%   BMI 33.33 kg/m    Wt Readings from Last 3 Encounters:  04/30/24 239 lb (108.4 kg)  02/25/24 237 lb 14.4 oz (107.9 kg)  02/12/24 241 lb 12.8 oz (109.7 kg)    Physical Exam Constitutional:      Appearance: He is obese.  Neck:     Vascular: No carotid bruit or JVD.  Cardiovascular:     Rate and Rhythm: Normal rate and regular rhythm.     Pulses: Intact distal pulses.     Heart sounds: Normal heart sounds. No murmur heard.    No gallop.  Pulmonary:     Effort: Pulmonary effort is normal.     Breath sounds: Normal breath sounds.  Abdominal:     General: Bowel sounds are normal.     Palpations: Abdomen is soft.  Musculoskeletal:     Right lower leg: No edema.     Left lower leg: No edema.    Studies Reviewed: .  CABG 2007: LIMA to LAD, SVG to D2, SVG to OM1 and ramus intermediate with OM1 limb occluded, SVG to RCA with a proximal 50% stenosis by coronary angiography in July 2018. Normal LVEF.  Coronary angiogram 10/08/2018 and had a very high-grade stenosis of a large D1/ramus branch S/P 3.0 x 23 mm Sierra Xience DES. Patent LIMA to LAD, SVG to D2 and SVG to RCA widely patent. Normal right heart catheterization, preserved cardiac output and cardiac index.      PCV ECHOCARDIOGRAM COMPLETE 03/20/2022   Narrative Echocardiogram 03/20/2022: Left ventricle cavity is normal in size. Moderate concentric hypertrophy of the left ventricle. Normal global wall motion.  Normal LV systolic function with visual EF 50-55%. Doppler evidence of grade I (impaired) diastolic dysfunction, normal LAP. The aortic root is upper normal at 3.8 cm. No significant valvular abnormality. Previous study in 2018 noted mild LA dilatation, aortic root 4.1 cm.  EKG:    EKG Interpretation Date/Time:  Thursday Apr 30 2024 10:12:42 EDT Ventricular Rate:  72 PR Interval:  236 QRS Duration:  148 QT Interval:  410 QTC Calculation: 448 R Axis:   29  Text Interpretation: EKG 04/30/2024: Sinus rhythm with first-degree AV block at the rate of 72 bpm with occasional PACs and PVCs.  Right bundle branch block.  Compared to 06/07/2023, PACs and PVCs new. Confirmed by Shyloh Krinke, Jagadeesh ([id number]) on 04/30/2024 11:03:00 AM    Medications and allergies    Allergies  Allergen Reactions   Morphine  Sulfate Itching   Rosuvastatin  Calcium  Other (See Comments)    Muscle weakness   Tramadol Itching    Other reaction(s): Unknown     Current Outpatient Medications:    albuterol  (PROVENTIL ) (2.5 MG/3ML) 0.083% nebulizer solution, USE 1 VIAL IN NEBULIZER EVERY 6 HOURS AS NEEDED FOR WHEEZING FOR SHORTNESS OF BREATH, Disp: 90 mL, Rfl: 0   albuterol  (VENTOLIN  HFA) 108 (90 Base) MCG/ACT inhaler, Inhale 2 puffs into the lungs every 6 (six) hours as needed for wheezing or shortness of breath., Disp: 8 g, Rfl: 6   apixaban  (ELIQUIS ) 5 MG TABS tablet, Take 1 tablet by mouth twice daily, Disp: 180 tablet, Rfl: 1   atorvastatin  (LIPITOR) 20 MG tablet, Take 20 mg by mouth at bedtime. , Disp: , Rfl: 5   clonazePAM  (KLONOPIN ) 1 MG tablet, Take 1 mg by mouth at bedtime as needed (sleep)., Disp: , Rfl:    fluticasone  (FLONASE ) 50 MCG/ACT nasal spray, Place 2 sprays into both nostrils daily., Disp: 16 g, Rfl: 2   gabapentin  (NEURONTIN ) 300 MG capsule, Take 300 mg by mouth 2 (two) times daily., Disp: , Rfl:    guaiFENesin (MUCINEX) 600 MG 12 hr tablet, Take 600 mg by mouth daily as needed (bronchitis)., Disp: , Rfl:     HYDROcodone -acetaminophen  (NORCO/VICODIN) 5-325 MG tablet, Take 1 tablet by mouth every 6 (six) hours as needed for moderate pain., Disp: , Rfl:    Magnesium 200 MG TABS, Take 200 mg by mouth in the morning., Disp: , Rfl:    Multiple Vitamin (MULTIVITAMIN WITH MINERALS) TABS tablet, Take 1 tablet by mouth in the morning., Disp: , Rfl:    nitroGLYCERIN  (NITROSTAT ) 0.4 MG SL tablet, Place 1 tablet (0.4 mg total) under the tongue every 5 (five) minutes as needed for chest pain., Disp: 25 tablet, Rfl: 3   Omega-3 Fatty Acids (FISH OIL) 1000 MG CAPS, Take 1,000 mg by mouth in the morning., Disp: , Rfl:    omeprazole  (PRILOSEC) 20 MG capsule, Take 1 capsule (  20 mg total) by mouth daily., Disp: 30 capsule, Rfl: 3   Phenylephrine -DM-GG (ROBITUSSIN COUGH/COLD CF MAX PO), Take 20 mLs by mouth every 6 (six) hours as needed (cough)., Disp: , Rfl:    potassium chloride  SA (KLOR-CON  M) 20 MEQ tablet, Take 20 mEq by mouth daily., Disp: , Rfl:    tamsulosin  (FLOMAX ) 0.4 MG CAPS capsule, Take 0.8 mg by mouth at bedtime., Disp: , Rfl:    tiZANidine  (ZANAFLEX ) 4 MG tablet, Take 4 mg by mouth at bedtime as needed for muscle spasms., Disp: , Rfl:    torsemide (DEMADEX) 20 MG tablet, Take 40 mg by mouth in the morning., Disp: , Rfl:    metoprolol  tartrate (LOPRESSOR ) 25 MG tablet, Take 0.5 tablets (12.5 mg total) by mouth 2 (two) times daily., Disp: , Rfl:    Meds ordered this encounter  Medications   metoprolol  tartrate (LOPRESSOR ) 25 MG tablet    Sig: Take 0.5 tablets (12.5 mg total) by mouth 2 (two) times daily.     Medications Discontinued During This Encounter  Medication Reason   Potassium Chloride  ER 20 MEQ TBCR Duplicate   metoprolol  tartrate (LOPRESSOR ) 25 MG tablet    isosorbide  mononitrate (IMDUR ) 30 MG 24 hr tablet Completed Course     ASSESSMENT AND PLAN: .      ICD-10-CM   1. Bradycardia by electrocardiogram  R00.1 metoprolol  tartrate (LOPRESSOR ) 25 MG tablet    LONG TERM MONITOR (3-14  DAYS)    2. Near syncope  R55 LONG TERM MONITOR (3-14 DAYS)    3. Paroxysmal atrial fibrillation (HCC)  I48.0 LONG TERM MONITOR (3-14 DAYS)    4. Coronary artery disease involving native coronary artery of native heart without angina pectoris  I25.10 EKG 12-Lead    5. Essential hypertension  I10       Assessment and Plan Assessment & Plan Bradycardia   He experiences intermittent bradycardia with near syncope, heart rate variability, and symptoms like feeling spaced out and chilled. Heart rates drop to the 50s and occasionally rise to 100. Metoprolol  may contribute to the low heart rate. A pacemaker may be necessary if significant bradycardia is detected. Reduce metoprolol  to 12.5 mg twice daily. Stop metoprolol  if the heart rate drops to 30 or if symptoms persist after dose reduction. Arrange for a heart monitor for a couple of weeks to assess for dangerous arrhythmias.  Atrial Fibrillation   He maintains a regular rhythm and is on Eliquis  to prevent thromboembolic events. Continue Eliquis  5 mg twice daily.  Coronary Artery Disease   He has coronary artery disease with blocked arteries but no recent chest pain or need for stents since 2019. The condition is well-managed on medical therapy. Continue atorvastatin  20 mg once daily.  Edema   Edema is managed with diuretics. He reports foot swelling if diuretics are not taken daily and prefers to continue daily use. Continue the current diuretic regimen as it helps with edema and possibly blood pressure control.  Primary hypertension Blood pressure is well-controlled, today blood pressure was 138/73 mmHg.  Presently on metoprolol  tartrate 12.5 mg twice daily dose reduced today from 25 twice daily, torsemide 20 mg 2 tablets in the morning.  Continue the same.  He does not wish to make changes to his medications for now with regard to diuretics.  He is not on an ACE inhibitor or ARB, this should be strongly considered in view of underlying  coronary artery disease and hypertension.  I would like to see him  back in 3 months for follow-up.  Signed,  Knox Perl, MD, [facility] 04/30/2024, 8:00 PM [facility] [address] [place], [place] [address] Phone: [phone]. Fax:  [phone]

## 2024-04-20 NOTE — Telephone Encounter (Signed)
 Prescription refill request for Eliquis  received. Indication: PAF Last office visit: 06/18/23  Luwanna Sam MD Scr: 1.43 on 06/07/23  Epic Age: 83 Weight: 111.3kg  Based on above findings Eliquis  5mg  twice daily is the appropriate dose.  Refill approved.

## 2024-04-21 DIAGNOSIS — H903 Sensorineural hearing loss, bilateral: Secondary | ICD-10-CM | POA: Diagnosis not present

## 2024-04-21 DIAGNOSIS — H9313 Tinnitus, bilateral: Secondary | ICD-10-CM | POA: Diagnosis not present

## 2024-04-30 ENCOUNTER — Ambulatory Visit: Attending: Cardiology | Admitting: Cardiology

## 2024-04-30 ENCOUNTER — Encounter: Payer: Self-pay | Admitting: Cardiology

## 2024-04-30 ENCOUNTER — Ambulatory Visit

## 2024-04-30 VITALS — BP 128/73 | HR 57 | Resp 16 | Ht 71.0 in | Wt 239.0 lb

## 2024-04-30 DIAGNOSIS — R001 Bradycardia, unspecified: Secondary | ICD-10-CM | POA: Diagnosis not present

## 2024-04-30 DIAGNOSIS — R55 Syncope and collapse: Secondary | ICD-10-CM

## 2024-04-30 DIAGNOSIS — I480 Paroxysmal atrial fibrillation: Secondary | ICD-10-CM

## 2024-04-30 DIAGNOSIS — I10 Essential (primary) hypertension: Secondary | ICD-10-CM

## 2024-04-30 DIAGNOSIS — I2510 Atherosclerotic heart disease of native coronary artery without angina pectoris: Secondary | ICD-10-CM

## 2024-04-30 MED ORDER — METOPROLOL TARTRATE 25 MG PO TABS: 12.5000 mg | ORAL_TABLET | Freq: Two times a day (BID) | ORAL | Status: DC

## 2024-04-30 NOTE — Progress Notes (Unsigned)
 Enrolled patient for a 14 day Zio XT  monitor to be mailed to patients home

## 2024-04-30 NOTE — Patient Instructions (Signed)
 Medication Instructions:  Your physician has recommended you make the following change in your medication: Decrease metoprolol  tartrate to 12. 5 mg by mouth twice daily  *If you need a refill on your cardiac medications before your next appointment, please call your pharmacy*  Lab Work: none If you have labs (blood work) drawn today and your tests are completely normal, you will receive your results only by: MyChart Message (if you have MyChart) OR A paper copy in the mail If you have any lab test that is abnormal or we need to change your treatment, we will call you to review the results.  Testing/Procedures: none  Follow-Up: At [facility], you and your health needs are our priority.  As part of our continuing mission to provide you with exceptional heart care, our providers are all part of one team.  This team includes your primary Cardiologist (physician) and Advanced Practice Providers or APPs (Physician Assistants and Nurse Practitioners) who all work together to provide you with the care you need, when you need it.  Your next appointment:   6 month(s)  Provider:   Knox Perl, MD    We recommend signing up for the patient portal called "MyChart".  Sign up information is provided on this After Visit Summary.  MyChart is used to connect with patients for Virtual Visits (Telemedicine).  Patients are able to view lab/test results, encounter notes, upcoming appointments, etc.  Non-urgent messages can be sent to your provider as well.   To learn more about what you can do with MyChart, go to [email or web address].   Other Instructions ZIO XT- Long Term Monitor Instructions  Your physician has requested you wear a ZIO patch monitor for 14 days.  This is a single patch monitor. [employer] supplies one patch monitor per enrollment. Additional stickers are not available. Please do not apply patch if you will be having a Nuclear Stress Test,  Echocardiogram, Cardiac CT, MRI, or  Chest Xray during the period you would be wearing the  monitor. The patch cannot be worn during these tests. You cannot remove and re-apply the  ZIO XT patch monitor.  Your ZIO patch monitor will be mailed 3 day USPS to your address on file. It may take 3-5 days  to receive your monitor after you have been enrolled.  Once you have received your monitor, please review the enclosed instructions. Your monitor  has already been registered assigning a specific monitor serial # to you.  Billing and Patient Assistance Program Information  We have supplied [employer] with any of your insurance information on file for billing purposes. [employer] offers a sliding scale Patient Assistance Program for patients that do not have  insurance, or whose insurance does not completely cover the cost of the ZIO monitor.  You must apply for the Patient Assistance Program to qualify for this discounted rate.  To apply, please call [employer] at [phone], select option 4, select option 2, ask to apply for  Patient Assistance Program. Sanna Crystal will ask your household income, and how many people  are in your household. They will quote your out-of-pocket cost based on that information.  [employer] will also be able to set up a 65-month, interest-free payment plan if needed.  Applying the monitor   Shave hair from upper left chest.  Hold abrader disc by orange tab. Rub abrader in 40 strokes over the upper left chest as  indicated in your monitor instructions.  Clean area with 4 enclosed alcohol pads. Let dry.  Apply patch as indicated in monitor instructions. Patch will be placed under collarbone on left  side of chest with arrow pointing upward.  Rub patch adhesive wings for 2 minutes. Remove white label marked "1". Remove the white  label marked "2". Rub patch adhesive wings for 2 additional minutes.  While looking in a mirror, press and release button in center of patch. A small green light will  flash 3-4 times. This  will be your only indicator that the monitor has been turned on.  Do not shower for the first 24 hours. You may shower after the first 24 hours.  Press the button if you feel a symptom. You will hear a small click. Record Date, Time and  Symptom in the Patient Logbook.  When you are ready to remove the patch, follow instructions on the last 2 pages of Patient  Logbook. Stick patch monitor onto the last page of Patient Logbook.  Place Patient Logbook in the blue and white box. Use locking tab on box and tape box closed  securely. The blue and white box has prepaid postage on it. Please place it in the mailbox as  soon as possible. Your physician should have your test results approximately 7 days after the  monitor has been mailed back to [facility].  Call [facility] Customer Care at [phone] if you have questions regarding  your ZIO XT patch monitor. Call them immediately if you see an orange light blinking on your  monitor.  If your monitor falls off in less than 4 days, contact our Monitor department at [phone].  If your monitor becomes loose or falls off after 4 days call [employer] at [phone] for  suggestions on securing your monitor

## 2024-05-19 DIAGNOSIS — I5032 Chronic diastolic (congestive) heart failure: Secondary | ICD-10-CM | POA: Diagnosis not present

## 2024-05-19 DIAGNOSIS — I4819 Other persistent atrial fibrillation: Secondary | ICD-10-CM | POA: Diagnosis not present

## 2024-05-19 DIAGNOSIS — I10 Essential (primary) hypertension: Secondary | ICD-10-CM | POA: Diagnosis not present

## 2024-05-19 DIAGNOSIS — J410 Simple chronic bronchitis: Secondary | ICD-10-CM | POA: Diagnosis not present

## 2024-05-23 ENCOUNTER — Other Ambulatory Visit: Payer: Self-pay | Admitting: Emergency Medicine

## 2024-05-25 DIAGNOSIS — R252 Cramp and spasm: Secondary | ICD-10-CM | POA: Diagnosis not present

## 2024-05-25 DIAGNOSIS — J22 Unspecified acute lower respiratory infection: Secondary | ICD-10-CM | POA: Diagnosis not present

## 2024-05-28 DIAGNOSIS — R001 Bradycardia, unspecified: Secondary | ICD-10-CM | POA: Diagnosis not present

## 2024-05-28 DIAGNOSIS — I480 Paroxysmal atrial fibrillation: Secondary | ICD-10-CM | POA: Diagnosis not present

## 2024-05-31 ENCOUNTER — Ambulatory Visit: Payer: Self-pay | Admitting: Cardiology

## 2024-05-31 DIAGNOSIS — I480 Paroxysmal atrial fibrillation: Secondary | ICD-10-CM | POA: Diagnosis not present

## 2024-05-31 DIAGNOSIS — R55 Syncope and collapse: Secondary | ICD-10-CM

## 2024-05-31 DIAGNOSIS — R001 Bradycardia, unspecified: Secondary | ICD-10-CM

## 2024-05-31 NOTE — Progress Notes (Signed)
 Patient's fatigue cannot be explained by his remote monitoring.  There was no significant bradycardia or heart block.  He has had brief episodes of atrial flutter and/or atrial fibrillation, presently is on anticoagulation hence nothing to worry about this.  Will continue to observe this for now.  Some of the symptoms of fatigue could be related to his bereavement from recent passing away of his wife.

## 2024-06-15 DIAGNOSIS — N1831 Chronic kidney disease, stage 3a: Secondary | ICD-10-CM | POA: Diagnosis not present

## 2024-06-15 DIAGNOSIS — I4819 Other persistent atrial fibrillation: Secondary | ICD-10-CM | POA: Diagnosis not present

## 2024-06-15 DIAGNOSIS — E7800 Pure hypercholesterolemia, unspecified: Secondary | ICD-10-CM | POA: Diagnosis not present

## 2024-06-15 DIAGNOSIS — I10 Essential (primary) hypertension: Secondary | ICD-10-CM | POA: Diagnosis not present

## 2024-06-15 DIAGNOSIS — I5032 Chronic diastolic (congestive) heart failure: Secondary | ICD-10-CM | POA: Diagnosis not present

## 2024-06-15 DIAGNOSIS — J410 Simple chronic bronchitis: Secondary | ICD-10-CM | POA: Diagnosis not present

## 2024-06-17 ENCOUNTER — Ambulatory Visit: Payer: HMO | Admitting: Cardiology

## 2024-06-17 DIAGNOSIS — I10 Essential (primary) hypertension: Secondary | ICD-10-CM | POA: Diagnosis not present

## 2024-06-17 DIAGNOSIS — J410 Simple chronic bronchitis: Secondary | ICD-10-CM | POA: Diagnosis not present

## 2024-06-17 DIAGNOSIS — I4819 Other persistent atrial fibrillation: Secondary | ICD-10-CM | POA: Diagnosis not present

## 2024-06-17 DIAGNOSIS — I5032 Chronic diastolic (congestive) heart failure: Secondary | ICD-10-CM | POA: Diagnosis not present

## 2024-06-23 DIAGNOSIS — E7800 Pure hypercholesterolemia, unspecified: Secondary | ICD-10-CM | POA: Diagnosis not present

## 2024-06-23 DIAGNOSIS — I10 Essential (primary) hypertension: Secondary | ICD-10-CM | POA: Diagnosis not present

## 2024-06-23 DIAGNOSIS — G259 Extrapyramidal and movement disorder, unspecified: Secondary | ICD-10-CM | POA: Diagnosis not present

## 2024-06-23 DIAGNOSIS — K219 Gastro-esophageal reflux disease without esophagitis: Secondary | ICD-10-CM | POA: Diagnosis not present

## 2024-06-23 DIAGNOSIS — N1831 Chronic kidney disease, stage 3a: Secondary | ICD-10-CM | POA: Diagnosis not present

## 2024-06-23 DIAGNOSIS — Z0000 Encounter for general adult medical examination without abnormal findings: Secondary | ICD-10-CM | POA: Diagnosis not present

## 2024-06-23 DIAGNOSIS — I5032 Chronic diastolic (congestive) heart failure: Secondary | ICD-10-CM | POA: Diagnosis not present

## 2024-06-23 DIAGNOSIS — D6869 Other thrombophilia: Secondary | ICD-10-CM | POA: Diagnosis not present

## 2024-06-23 DIAGNOSIS — J0101 Acute recurrent maxillary sinusitis: Secondary | ICD-10-CM | POA: Diagnosis not present

## 2024-06-23 DIAGNOSIS — E119 Type 2 diabetes mellitus without complications: Secondary | ICD-10-CM | POA: Diagnosis not present

## 2024-06-23 DIAGNOSIS — I4819 Other persistent atrial fibrillation: Secondary | ICD-10-CM | POA: Diagnosis not present

## 2024-06-23 DIAGNOSIS — G8929 Other chronic pain: Secondary | ICD-10-CM | POA: Diagnosis not present

## 2024-07-16 DIAGNOSIS — I4819 Other persistent atrial fibrillation: Secondary | ICD-10-CM | POA: Diagnosis not present

## 2024-07-16 DIAGNOSIS — N1831 Chronic kidney disease, stage 3a: Secondary | ICD-10-CM | POA: Diagnosis not present

## 2024-07-16 DIAGNOSIS — E7800 Pure hypercholesterolemia, unspecified: Secondary | ICD-10-CM | POA: Diagnosis not present

## 2024-07-16 DIAGNOSIS — J410 Simple chronic bronchitis: Secondary | ICD-10-CM | POA: Diagnosis not present

## 2024-07-16 DIAGNOSIS — I5032 Chronic diastolic (congestive) heart failure: Secondary | ICD-10-CM | POA: Diagnosis not present

## 2024-07-16 DIAGNOSIS — I10 Essential (primary) hypertension: Secondary | ICD-10-CM | POA: Diagnosis not present

## 2024-07-17 DIAGNOSIS — I4819 Other persistent atrial fibrillation: Secondary | ICD-10-CM | POA: Diagnosis not present

## 2024-07-17 DIAGNOSIS — J410 Simple chronic bronchitis: Secondary | ICD-10-CM | POA: Diagnosis not present

## 2024-07-17 DIAGNOSIS — I5032 Chronic diastolic (congestive) heart failure: Secondary | ICD-10-CM | POA: Diagnosis not present

## 2024-07-17 DIAGNOSIS — I10 Essential (primary) hypertension: Secondary | ICD-10-CM | POA: Diagnosis not present

## 2024-08-04 ENCOUNTER — Encounter: Payer: Self-pay | Admitting: Cardiology

## 2024-08-04 ENCOUNTER — Ambulatory Visit: Attending: Cardiology | Admitting: Cardiology

## 2024-08-04 VITALS — BP 108/58 | HR 74 | Resp 16 | Ht 71.0 in | Wt 237.4 lb

## 2024-08-04 DIAGNOSIS — I4729 Other ventricular tachycardia: Secondary | ICD-10-CM

## 2024-08-04 DIAGNOSIS — G4733 Obstructive sleep apnea (adult) (pediatric): Secondary | ICD-10-CM

## 2024-08-04 DIAGNOSIS — I480 Paroxysmal atrial fibrillation: Secondary | ICD-10-CM

## 2024-08-04 DIAGNOSIS — I2510 Atherosclerotic heart disease of native coronary artery without angina pectoris: Secondary | ICD-10-CM | POA: Diagnosis not present

## 2024-08-04 DIAGNOSIS — I471 Supraventricular tachycardia, unspecified: Secondary | ICD-10-CM

## 2024-08-04 MED ORDER — ATORVASTATIN CALCIUM 10 MG PO TABS: 10.0000 mg | ORAL_TABLET | Freq: Every day | ORAL | 3 refills | Status: AC

## 2024-08-04 MED ORDER — POTASSIUM CHLORIDE CRYS ER 20 MEQ PO TBCR: 20.0000 meq | EXTENDED_RELEASE_TABLET | Freq: Every day | ORAL | 3 refills | Status: AC

## 2024-08-04 MED ORDER — EZETIMIBE 10 MG PO TABS: 10.0000 mg | ORAL_TABLET | Freq: Every day | ORAL | 3 refills | Status: AC

## 2024-08-04 NOTE — Progress Notes (Signed)
 Cardiology Office Note:  .   Date:  08/04/2024  ID:  [name], DOB [birth year], MRN [id number] PCP: Arloa Elsie SAUNDERS, MD  [facility] Providers Cardiologist:  Gordy Bergamo, MD   History of Present Illness: .   [name] is a 83 y.o. male with CAD S/P CABG in 2007 in the setting of non-STEMI in atrial fibrillation with RVR. He has a history of 45 pack year h/o smoking tobacco, ascending aortic aneurysm (follows Dr.Bryan Bartle), small abdominal aortic aneurysm noted in 2015 (3cm), H/O colon cancer, in remission since 1992, GERD, hyperlipidemia, obstructive sleep apnea on CPAP.  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  He has also had ASD repair on 06/13/2021 noted on TEE prior to cardioversion.  On his last office visit, due to marked fatigue and bradycardia, I recommended reducing the dose of metoprolol  and also recommended a event monitor for 14 days.  He now presents for follow-up.  He was started on CPAP in February 2025.  He went back on increased dose of metoprolol  succinate 25 mg daily due to elevated heart rate.  He has not developed a upper respiratory infection and feels poorly but otherwise no other specific complaints.  He has not had any further near syncopal spells.  Cardiac Studies relevent.    Extended EKG monitoring 14 days for bradycardia starting 05/03/2024: Predominant underlying rhythm was sinus rhythm with first-degree AV block.  Minimum heart rate 48 bpm at 1:30 AM and maximum 112 bpm at 12:59 PM with average heart rate of 81 bpm. There were 2 NSVT episodes, longest 13 beats.  Both episodes during awake hours. There were frequent SVT episodes (3016) with peak heart rate of 222 bpm.  Longest 20.2 seconds, fastest 14 seconds at ventricular rate of 202 bpm. Burden could not be determined. EKG suggests either brief atrial  tachycardia or atrial flutter. There are occasional PACs (5%), PVCs, ventricular couplets.  PVC burden <3%. There were no patient triggered events.  There was no heart block, no sustained atrial fibrillation.         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  Echocardiogram 03/20/2022: Left ventricle cavity is normal in size. Moderate concentric hypertrophy of the left ventricle. Normal global wall motion. Normal LV systolic function with visual EF 50-55%. Doppler evidence of grade I (impaired) diastolic dysfunction, normal LAP.  CABG 2007: LIMA to LAD, SVG to D2, SVG to OM1 and ramus intermediate with OM1 limb occluded, SVG to [facility] coronary angiography in July 2018. Normal LVEF.   Coronary angiogram 10/08/2018 and had a very high-grade stenosis of a large D1/ramus branch S/P 3.0 x 23 mm Sierra Xience DES. Patent LIMA to LAD, SVG to D2 and SVG to RCA widely patent.  Normal right heart catheterization, preserved cardiac output and cardiac index.     Lexiscan  Tetrofosmin  stress test 06/05/2021: Lexiscan  nuclear stress test performed using 1-day protocol. Decreased tracer uptake in inferior myocardium during rest and stress likely due to gut attenuation. Stress LVEF 57%. Low risk study.    Discussed the use of AI scribe software for clinical note transcription with the patient, who gave verbal consent to proceed.  History of Present Illness [name] is an 83 year old male with atrial fibrillation and non-sustained ventricular tachycardia who  presents for an electrophysiology consultation.  He experiences fatigue and tiredness, which may be related to his atrial fibrillation. He adjusted his metoprolol  dosage but returned to 25 mg due to increased heart rate, which he sometimes feels when the dose is reduced.  He requests a renewal of his potassium prescription. He discontinued  atorvastatin  a couple of months ago due to severe cramps, which resolved after stopping the medication. However, his cholesterol levels were noted to be high during a recent physical examination, and he is concerned about the cramps returning if he resumes the medication.  Labs    ROS  Review of Systems  Constitutional: Positive for malaise/fatigue.  Cardiovascular:  Positive for dyspnea on exertion and palpitations. Negative for chest pain.  Respiratory:  Positive for cough and snoring.    Physical Exam:   VS:  BP (!) 108/58 (BP Location: Left Arm, Patient Position: Sitting, Cuff Size: Large)   Pulse 74   Resp 16   Ht 5' 11 (1.803 m)   SpO2 97%   BMI 33.33 kg/m    Wt Readings from Last 3 Encounters:  04/30/24 239 lb (108.4 kg)  02/25/24 237 lb 14.4 oz (107.9 kg)  02/12/24 241 lb 12.8 oz (109.7 kg)    BP Readings from Last 3 Encounters:  08/04/24 (!) 108/58  04/30/24 128/73  02/25/24 106/79   Physical Exam Constitutional:      Appearance: He is obese.  Neck:     Vascular: No carotid bruit or JVD.  Cardiovascular:     Rate and Rhythm: Normal rate and regular rhythm.     Heart sounds: Normal heart sounds. No murmur heard.    No gallop.  Pulmonary:     Effort: Pulmonary effort is normal.     Breath sounds: Normal breath sounds.  Abdominal:     General: Bowel sounds are normal.     Palpations: Abdomen is soft.  Musculoskeletal:     Right lower leg: No edema.     Left lower leg: No edema.    EKG:         ASSESSMENT AND PLAN: .      ICD-10-CM   1. Paroxysmal atrial fibrillation (HCC)  I48.0 metoprolol  succinate (TOPROL -XL) 25 MG 24 hr tablet    Ambulatory referral to Cardiac Electrophysiology    potassium chloride  SA (KLOR-CON  M) 20 MEQ tablet    2. NSVT (nonsustained ventricular tachycardia) (HCC)  I47.29     3. SVT (supraventricular tachycardia) (HCC)  I47.10 Ambulatory referral to Cardiac Electrophysiology    4. OSA (obstructive sleep apnea)  G47.33  Ambulatory referral to Pulmonology    5. Coronary artery disease involving native coronary artery of native heart without angina pectoris  I25.10 atorvastatin  (LIPITOR) 10 MG tablet    ezetimibe  (ZETIA ) 10 MG tablet     Assessment & Plan Atrial fibrillation and nonsustained ventricular tachycardia and PSVT Atrial fibrillation and nonsustained ventricular tachycardia requiring further evaluation by electrophysiology. Symptoms may be related to obstructive sleep apnea. - Refer to electrophysiology for atrial fibrillation and nonsustained ventricular tachycardia evaluation - He has had frequent episodes of what appears to be either atrial flutter or atrial tachycardia, total burden could not be estimated by recent outpatient EKG monitoring.  Presently on anticoagulant with Eliquis . - Do not think progression of coronary artery disease. - Patient does have history of ASD repair on 06/13/2021 with a 10 and a 12 mm ASD Amplatzer device. 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  He is  presently on low-dose beta-blocker therapy and his heart rate was 54 bpm previously on EKG.  Obstructive sleep apnea Obstructive sleep apnea with non-compliance to CPAP therapy due to outdated equipment. Potential link to irregular heart rhythms. Re-evaluation through a repeat sleep study is necessary to address equipment issues and improve compliance. - Refer to pulmonary medicine for repeat sleep study  Hypercholesterolemia Hypercholesterolemia with previous intolerance to atorvastatin  due to muscle cramps. Recent physical indicated elevated cholesterol levels. Plan to reduce atorvastatin  dose to minimize cramps while adding Zetia  to effectively manage LDL levels. - Reduce atorvastatin  dose from 20 mg to 10 mg daily - Add Zetia  10 mg once daily - Needs follow-up lipids at some point   Follow up: 6 months  Signed,  Gordy Bergamo, MD, [facility] 08/04/2024, 12:46 PM [facility] [address] [place], [place] [address] Phone: [phone]. Fax:  [phone]

## 2024-08-04 NOTE — Patient Instructions (Addendum)
 Medication Instructions:  Your physician has recommended you make the following change in your medication:  Decrease lipitor to 10mg  daily Start zetia  10mg  daily  Lab Work: None ordered.  You may go to any [facility] Location for your lab work:  [facility] - [address] ([facility]) - [address] [phone] [address]  [place] - [address]   [place]  - [address]   [place] - [address] - [address] [place] [place]  - [address] [place] [address] ([facility]   If you have labs (blood work) drawn today and your tests are completely normal, you will receive your results only by: [employer] (if you have MyChart)  If you have any lab test that is abnormal or we need to change your treatment, we will call you or send a MyChart message to review the results.  Testing/Procedures: None ordered.  Follow-Up: At [facility], you and your health needs are our priority.  As part of our continuing mission to provide you with exceptional heart care, we have created designated Provider Care Teams.  These Care Teams include your primary Cardiologist (physician) and Advanced Practice Providers (APPs -  Physician Assistants and Nurse Practitioners) who all work together to provide you with the care you need, when you need it.  We recommend signing up for the patient portal called MyChart.  Sign up information is provided on this After Visit Summary.  MyChart is used to connect with patients for Virtual Visits (Telemedicine).  Patients are able to view lab/test results, encounter notes, upcoming appointments, etc.  Non-urgent messages can be sent to your provider as well.   To learn more about what you can do with MyChart, go to [email or web address].    Your next appointment:   Referral has been placed to Electrophysiology They will call to set up that  appointment   Referral has been placed to Pulmonology They will call to set up that appointment  6 months with Dr Ladona

## 2024-08-05 ENCOUNTER — Ambulatory Visit (HOSPITAL_BASED_OUTPATIENT_CLINIC_OR_DEPARTMENT_OTHER)

## 2024-08-05 ENCOUNTER — Encounter: Payer: Self-pay | Admitting: Internal Medicine

## 2024-08-05 ENCOUNTER — Ambulatory Visit (INDEPENDENT_AMBULATORY_CARE_PROVIDER_SITE_OTHER): Admitting: Internal Medicine

## 2024-08-05 VITALS — BP 120/57 | HR 80 | Ht 71.0 in | Wt 237.8 lb

## 2024-08-05 DIAGNOSIS — J449 Chronic obstructive pulmonary disease, unspecified: Secondary | ICD-10-CM

## 2024-08-05 DIAGNOSIS — J209 Acute bronchitis, unspecified: Secondary | ICD-10-CM | POA: Insufficient documentation

## 2024-08-05 MED ORDER — PANTOPRAZOLE SODIUM 40 MG PO TBEC: 40.0000 mg | DELAYED_RELEASE_TABLET | Freq: Every day | ORAL | 2 refills | Status: DC

## 2024-08-05 MED ORDER — FAMOTIDINE 20 MG PO TABS: ORAL_TABLET | ORAL | 11 refills | Status: DC

## 2024-08-05 NOTE — Progress Notes (Signed)
 [name], male    DOB: 30-Apr-1941    MRN: [id number]   Brief patient profile:  51  yowm quit smoking 1997 with overt HB only cc  [facility] pt with clinically mild copd referred to pulmonary clinic in [place]  08/05/2024  for cough    07/21/15 PFTs nl  Could not do full PFT 09/11/18 but able to do SVC which was nl with ERV 17% at wt 246   Last [facility] OV  04/24/22    History of Present Illness  08/05/2024  Pulmonary/ 1st office eval/ [facility]rc[facility] / [facility]  (bronchtis x 2-3 weeks) Chief Complaint  Patient presents with   COPD    Acute - bronchitis/coughing  Onset was with rhinitis/ST  Dyspnea:  nothing unusual walks everywhere slow pace  Cough: clear   Sleep: no longer wearing cpap x 2 years but was not comfortable /presently back in flat bed one pillow and does ok  SABA use: hfa/ neb both but neither prior to OV   02: none    No obvious day to day or daytime pattern/variability or assoc  purulent sputum or mucus plugs or hemoptysis or cp or chest tightness, subjective wheeze or overt   hb symptoms.    Also denies any obvious fluctuation of symptoms with weather or environmental changes or other aggravating or alleviating factors except as outlined above   No unusual exposure hx or h/o childhood pna/ asthma or knowledge of premature birth.  Current Allergies, Complete Past Medical History, Past Surgical History, Family History, and Social History were reviewed in [employer] record.  ROS  The following are not active complaints unless bolded Hoarseness, sore throat, dysphagia, dental problems, itching, sneezing,  nasal congestion or discharge of excess mucus or purulent secretions, ear ache,   fever, chills, sweats, unintended wt loss or wt gain, classically pleuritic or exertional cp,  orthopnea pnd or arm/hand swelling  or leg swelling, presyncope, palpitations, abdominal pain, anorexia, nausea, vomiting, diarrhea  or change in bowel habits or change in  bladder habits, change in stools or change in urine, dysuria, hematuria,  rash, arthralgias, visual complaints, headache, numbness, weakness or ataxia or problems with walking or coordination,  change in mood or  memory.            Outpatient Medications Prior to Visit  Medication Sig Dispense Refill   albuterol  (PROVENTIL ) (2.5 MG/3ML) 0.083% nebulizer solution USE 1 VIAL IN NEBULIZER EVERY 6 HOURS AS NEEDED FOR WHEEZING OR SHORTNESS OF BREATH 90 mL 0   albuterol  (VENTOLIN  HFA) 108 (90 Base) MCG/ACT inhaler Inhale 2 puffs into the lungs every 6 (six) hours as needed for wheezing or shortness of breath. 8 g 6   apixaban  (ELIQUIS ) 5 MG TABS tablet Take 1 tablet by mouth twice daily 180 tablet 1   atorvastatin  (LIPITOR) 10 MG tablet Take 1 tablet (10 mg total) by mouth at bedtime. 90 tablet 3   clonazePAM  (KLONOPIN ) 1 MG tablet Take 1 mg by mouth at bedtime as needed (sleep).     ezetimibe  (ZETIA ) 10 MG tablet Take 1 tablet (10 mg total) by mouth daily. 90 tablet 3   fluticasone  (FLONASE ) 50 MCG/ACT nasal spray Place 2 sprays into both nostrils daily. 16 g 2   gabapentin  (NEURONTIN ) 300 MG capsule Take 300 mg by mouth 2 (two) times daily.     guaiFENesin (MUCINEX) 600 MG 12 hr tablet Take 600 mg by mouth daily as needed (bronchitis).  HYDROcodone -acetaminophen  (NORCO/VICODIN) 5-325 MG tablet Take 1 tablet by mouth every 6 (six) hours as needed for moderate pain.     Magnesium 200 MG TABS Take 200 mg by mouth in the morning.     metoprolol  succinate (TOPROL -XL) 25 MG 24 hr tablet Take 25 mg by mouth daily.     Multiple Vitamin (MULTIVITAMIN WITH MINERALS) TABS tablet Take 1 tablet by mouth in the morning.     nitroGLYCERIN  (NITROSTAT ) 0.4 MG SL tablet Place 1 tablet (0.4 mg total) under the tongue every 5 (five) minutes as needed for chest pain. 25 tablet 3   Omega-3 Fatty Acids (FISH OIL) 1000 MG CAPS Take 1,000 mg by mouth in the morning.     omeprazole  (PRILOSEC) 20 MG capsule Take 1 capsule  (20 mg total) by mouth daily. 30 capsule 3   Phenylephrine -DM-GG (ROBITUSSIN COUGH/COLD CF MAX PO) Take 20 mLs by mouth every 6 (six) hours as needed (cough).     potassium chloride  SA (KLOR-CON  M) 20 MEQ tablet Take 1 tablet (20 mEq total) by mouth daily. 90 tablet 3   tamsulosin  (FLOMAX ) 0.4 MG CAPS capsule Take 0.8 mg by mouth at bedtime.     tiZANidine  (ZANAFLEX ) 4 MG tablet Take 4 mg by mouth at bedtime as needed for muscle spasms.     torsemide (DEMADEX) 20 MG tablet Take 40 mg by mouth in the morning.     No facility-administered medications prior to visit.    Past Medical History:  Diagnosis Date   Allergy    Arthritis    BPH (benign prostatic hyperplasia)    CAD (coronary artery disease)    s/p CABG 2007; NSTEMI in setting of AFib with RVR 12/2009; cath 1/11: S-RCA ok with prox 30-40% and 50% stenoses; S-OM and RI  with patent OM limb but an occluded RI limb; S-D2 and L-LAD ok; EF 55%   Cataract    removed both eyes    Clotting disorder (HCC)    s/p CABG x 5- PE    Colon cancer (HCC) 1992   COPD (chronic obstructive pulmonary disease) (HCC)    Diverticulosis    Essential hypertension    Family history of breast cancer    Family history of colon cancer    Family history of lung cancer    GERD (gastroesophageal reflux disease)    History of pulmonary embolus (PE) 2007   Following CABG   Hyperlipidemia    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HCC)    Right upper   Myocardial infarction (HCC) 2007   NSTEMI, one stent, A fib, blood thinner   OSA on CPAP    Paroxysmal atrial fibrillation (HCC)    Previously on Multaq, declines anticoagulation   Personal history of colonic polyps 01/22/2000   Tubular adenoma   RBBB (right bundle branch block)    Sleep apnea    no cpap    Squamous cell carcinoma of skin 02/11/2008   [place]-Left paraspinal, lower    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   in situ-left forearm   Stroke (HCC) 2024   MRI showed had one in the past      Objective:      BP (!) 120/57   Pulse 80   Ht 5' 11 (1.803 m)   Wt 237 lb 12.8 oz (107.9 kg)   SpO2 94% Comment: ra  BMI 33.17 kg/m   SpO2: 94 % (ra) Mod obese (by BMI) extremely hoarse pleasant amb wm  NAD  HEENT : Oropharynx  slt erythema/ or excess pnd or cobblestoning      Nasal turbinates nl    NECK :  without  apparent JVD/ palpable Nodes/TM    LUNGS: no acc muscle use,  Nl contour chest which is clear to A and P bilaterally without cough on insp or exp maneuvers   CV:  RRR  no s3 or murmur or increase in P2, and no edema   ABD:  soft and nontender   MS:  Gait nl   ext warm without deformities Or obvious joint restrictions  calf tenderness, cyanosis or clubbing    SKIN: warm and dry without lesions    NEURO:  alert, approp, nl sensorium with  no motor or cerebellar deficits apparent.       Assessment   Assessment & Plan Acute bronchitis, unspecified organism Quit smoking 1997 with overt HB  -07/21/15 PFTs nl  - Could not do full PFT 09/11/18 but able to do SVC which was nl with ERV 17% at wt 246  - [facility] early Aug 2025 c/w viral uri > cough > worse LPR  but clear lungs on exam   Of the three most common causes of  Sub-acute / recurrent or chronic cough, only one (GERD which the pt reports for years)  can actually contribute to/ trigger  the other two (asthma and post nasal drip syndrome)  and perpetuate the cylce of cough.  While not intuitively obvious, many patients with chronic low grade reflux do not cough until there is a primary insult that disturbs the protective epithelial barrier and exposes sensitive nerve endings.   This is typically viral but can due to PNDS and  either may apply here.     The point is that once this occurs, it is difficult to eliminate the cycle  using anything but a maximally effective acid suppression regimen at least in the short run, accompanied by an appropriate diet to address non acid GERD and control / eliminate the cough itself with  mucinex/ flutter valve and increase does of gabapentin  to max of 300 mg qid while suppressig the urge to clear the throat with use of non-mint/menthol hard rock candy - see avs for instructions unique to this ov    Each maintenance medication was reviewed in detail including emphasizing most importantly the difference between maintenance and prns and under what circumstances the prns are to be triggered using an action plan format where appropriate.  Total time for H and P, chart review, counseling, reviewing hfa/neb device(s) and generating customized AVS unique to this office visit / same day charting = 42 min with acute pt new to me.            Patient Instructions  Increase gabapentin  to 300 mg 4x daily until cough better.  For cough/ congestion > mucinex  maximum of  1200 mg every 12 hours and use the flutter valve as much as you can    Pantoprazole  (protonix ) 40 mg   Take  30-60 min before first meal of the day and Pepcid  (famotidine )  20 mg after supper until return to office - this is the best way to tell whether stomach acid is contributing to your problem.    NO mint or menthol products  - use jolley ranchers or life savers  (not the white ones) to suppress to urge to clear throat and rest your voice    Sleep medicine evaluation  next available provider  Dr Shelah in  3 months        Ozell America, MD 08/05/2024

## 2024-08-05 NOTE — Patient Instructions (Addendum)
 Increase gabapentin  to 300 mg 4x daily until cough better.  For cough/ congestion > mucinex  maximum of  1200 mg every 12 hours and use the flutter valve as much as you can    Pantoprazole  (protonix ) 40 mg   Take  30-60 min before first meal of the day and Pepcid  (famotidine )  20 mg after supper until return to office - this is the best way to tell whether stomach acid is contributing to your problem.    NO mint or menthol products  - use jolley ranchers or life savers  (not the white ones) to suppress to urge to clear throat and rest your voice    Sleep medicine evaluation  next available provider  Dr Shelah in 3 months

## 2024-08-05 NOTE — Assessment & Plan Note (Addendum)
 Quit smoking 1997 with overt HB  -07/21/15 PFTs nl  - Could not do full PFT 09/11/18 but able to do SVC which was nl with ERV 17% at wt 246  - [facility]air early Aug 2025 c/w viral uri > cough > worse LPR  but clear lungs on exam   Of the three most common causes of  Sub-acute / recurrent or chronic cough, only one (GERD which the pt reports for years)  can actually contribute to/ trigger  the other two (asthma and post nasal drip syndrome)  and perpetuate the cylce of cough.  While not intuitively obvious, many patients with chronic low grade reflux do not cough until there is a primary insult that disturbs the protective epithelial barrier and exposes sensitive nerve endings.   This is typically viral but can due to PNDS and  either may apply here.     The point is that once this occurs, it is difficult to eliminate the cycle  using anything but a maximally effective acid suppression regimen at least in the short run, accompanied by an appropriate diet to address non acid GERD and control / eliminate the cough itself with mucinex/ flutter valve and increase does of gabapentin  to max of 300 mg qid while suppressig the urge to clear the throat with use of non-mint/menthol hard rock candy - see avs for instructions unique to this ov    Each maintenance medication was reviewed in detail including emphasizing most importantly the difference between maintenance and prns and under what circumstances the prns are to be triggered using an action plan format where appropriate.  Total time for H and P, chart review, counseling, reviewing hfa/neb device(s) and generating customized AVS unique to this office visit / same day charting = 42 min with acute pt new to me.

## 2024-08-16 DIAGNOSIS — N1831 Chronic kidney disease, stage 3a: Secondary | ICD-10-CM | POA: Diagnosis not present

## 2024-08-16 DIAGNOSIS — I4819 Other persistent atrial fibrillation: Secondary | ICD-10-CM | POA: Diagnosis not present

## 2024-08-16 DIAGNOSIS — E7800 Pure hypercholesterolemia, unspecified: Secondary | ICD-10-CM | POA: Diagnosis not present

## 2024-08-16 DIAGNOSIS — J410 Simple chronic bronchitis: Secondary | ICD-10-CM | POA: Diagnosis not present

## 2024-08-16 DIAGNOSIS — I5032 Chronic diastolic (congestive) heart failure: Secondary | ICD-10-CM | POA: Diagnosis not present

## 2024-08-16 DIAGNOSIS — I10 Essential (primary) hypertension: Secondary | ICD-10-CM | POA: Diagnosis not present

## 2024-08-18 ENCOUNTER — Telehealth: Payer: Self-pay | Admitting: Cardiology

## 2024-08-18 DIAGNOSIS — I495 Sick sinus syndrome: Secondary | ICD-10-CM

## 2024-08-18 DIAGNOSIS — I480 Paroxysmal atrial fibrillation: Secondary | ICD-10-CM

## 2024-08-18 NOTE — Telephone Encounter (Signed)
 ICD-10-CM   1. 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 Paroxysmal atrial fibrillation (HCC)  I48.0 Ambulatory referral to Cardiac Electrophysiology     Orders Placed This Encounter  Procedures   Ambulatory referral to Cardiac Electrophysiology    Referral Priority:   Routine    Referral Type:   Consultation    Referral Reason:   Specialty Services Required    Requested Specialty:   Cardiology    Number of Visits Requested:   1

## 2024-08-18 NOTE — Telephone Encounter (Signed)
 Pt requesting a c/b about slow HR I tried to get more information but the pt refused until he's able to speak with a nurse.

## 2024-08-18 NOTE — Telephone Encounter (Signed)
 Called patient back about message. Patient stated he has been having some episodes were sometimes when sitting his chest gets tight and his heart rate is in the 40's. Patient stated this happens about two to three times a week, and last for about a minute. Patient has been taking metoprolol  tartrate 25 mg BID. Patient stated he has nitroglycerin , but by the time he get it, the chest tightness is gone. Will send message to Dr. Ganji for advisement.

## 2024-08-20 NOTE — Telephone Encounter (Signed)
 Pt calling back to f/u on call from 9/2. Please advise

## 2024-08-20 NOTE — Telephone Encounter (Signed)
 I would request EP to see him ASAP. He needs sick sinus syndrome managed. I may not be helpful as I have had difficulty with low HR and high HR

## 2024-08-20 NOTE — Telephone Encounter (Signed)
 Called and spoke with patient. Informed patient of referral to EP per Dr. Ladona. Patient states he has an appointment to see Dr. Almetta on 09/07/2024. Patient states that he does not want to wait 6 months to follow up with Dr. Ladona and would prefer to see him in about a month or so to discuss recent episodes of bradycardia and chest tightness. Patient given ED precautions. Patient states that his heart rate has been in the low to mid 50s today. Oxygen  was 93% and heart rate 67 while on phone call. Patient did not have a blood pressure to report today. Advised patient Dr. Ladona would be forwarded this note to advise on earlier follow up visit. Patient verbalized understanding and agreeable to plan.

## 2024-08-26 NOTE — Telephone Encounter (Signed)
 Fyi.

## 2024-08-30 ENCOUNTER — Ambulatory Visit
Admission: EM | Admit: 2024-08-30 | Discharge: 2024-08-30 | Disposition: A | Discharge status: Home / Self Care | Attending: Nurse Practitioner | Admitting: Nurse Practitioner

## 2024-08-30 DIAGNOSIS — J019 Acute sinusitis, unspecified: Secondary | ICD-10-CM

## 2024-08-30 LAB — POC SOFIA SARS ANTIGEN FIA: SARS Coronavirus 2 Ag: NEGATIVE

## 2024-08-30 MED ORDER — DOXYCYCLINE HYCLATE 100 MG PO CAPS: 100.0000 mg | ORAL_CAPSULE | Freq: Two times a day (BID) | ORAL | 0 refills | Status: AC

## 2024-08-30 NOTE — Discharge Instructions (Addendum)
 The COVID test was negative. Take medication as directed. Continue your current allergy medication regimen. Increase fluids and get plenty of rest. May take over-the-counter Tylenol  as needed for pain, fever, or general discomfort. Recommend normal saline nasal spray to help with nasal congestion throughout the day. Warm salt water gargles 3-4 times daily as needed for throat pain or discomfort. For your cough, it may be helpful to use a humidifier at bedtime during sleep. If your symptoms fail to improve with this treatment, recommend follow-up with your primary care physician for further evaluation. Follow-up as needed.

## 2024-08-30 NOTE — ED Triage Notes (Signed)
 Sore throat  and nasal congestion since Thursday, right eye pain and some swelling to right side of face this morning.  Has been using nasonex spray and mucinex this morning.

## 2024-08-30 NOTE — ED Provider Notes (Signed)
 [facility]    CSN: [id number] Arrival date & time: 08/30/24  1401      History   Chief Complaint Chief Complaint  Patient presents with   Sore Throat    HPI [name] is a 83 y.o. male.   The history is provided by the patient.   Patient presents with a 3-day history of sore throat and nasal congestion.  He states that he woke up this morning with swelling to the right eye and to the right side of his face.  States that he has been using Flonase  and Mucinex since this morning.  He denies fever, chills, runny nose, ear pain, ear drainage, cough, wheezing, difficulty breathing, abdominal pain, nausea, vomiting, diarrhea, or rash.  He denies any obvious close sick contacts.  Patient states he was last treated for sinus infection approximately 5 to 6 weeks ago.  Past Medical History:  Diagnosis Date   Allergy    Arthritis    BPH (benign prostatic hyperplasia)    CAD (coronary artery disease)    s/p CABG 2007; NSTEMI in setting of AFib with RVR 12/2009; cath 1/11: S-RCA ok with prox 30-40% and 50% stenoses; S-OM and RI  with patent OM limb but an occluded RI limb; S-D2 and L-LAD ok; EF 55%   Cataract    removed both eyes    Clotting disorder (HCC)    s/p CABG x 5- PE    Colon cancer (HCC) 1992   COPD (chronic obstructive pulmonary disease) (HCC)    Diverticulosis    Essential hypertension    Family history of breast cancer    Family history of colon cancer    Family history of lung cancer    GERD (gastroesophageal reflux disease)    History of pulmonary embolus (PE) 2007   Following CABG   Hyperlipidemia    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HCC)    Right upper   Myocardial infarction (HCC) 2007   NSTEMI, one stent, A fib, blood thinner   OSA on CPAP    Paroxysmal atrial fibrillation (HCC)    Previously on Multaq, declines anticoagulation   Personal history of colonic polyps 01/22/2000   Tubular adenoma   RBBB (right bundle branch block)    Sleep apnea    no cpap     Squamous cell carcinoma of skin 02/11/2008   Bowens-Left paraspinal, lower    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   in situ-left forearm   Stroke (HCC) 2024   MRI showed had one in the past    Patient Active Problem List   Diagnosis Date Noted   Acute bronchitis 08/05/2024   Pain in left wrist 01/02/2022   Joint swelling 01/02/2022   Abdominal aortic aneurysm without rupture (HCC) 11/14/2021   Anxiety 11/14/2021   Balanitis 11/14/2021   Chronic kidney disease, stage 3a (HCC) 11/14/2021   Chronic pain 11/14/2021   Enlarged prostate 11/14/2021   Extrapyramidal and movement disorder, 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   Sciatica 11/14/2021   Simple chronic bronchitis (HCC) 11/14/2021   Secondary right ventricular dilation    Atrial septal defect 06/12/2021   Acute on chronic combined systolic and diastolic CHF (congestive heart failure) --EF 35 to 40% 03/26/2021   New onset a-fib (HCC) 03/25/2021   Paroxysmal A-fib with RVR 03/25/2021   History of  deep vein thrombosis (DVT)/H/o Provoked DVT in 2007 --after CABG,  03/25/2021   Low back pain 10/20/2020   Pain of right hip joint 10/20/2020   Pneumonia due to  COVID-19 virus 10/04/2020   Genetic testing 02/17/2020   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   Family history of colon cancer    Family history of breast cancer    Family history of lung cancer    Allergic rhinitis 07/21/2019   Nasal obstruction 01/22/2019   Post PTCA 10/07/2018   Enrolled in clinical trial of drug 10/07/2018   COPD (chronic obstructive pulmonary disease) (HCC) 08/08/2018   CAD-  S/P stenting of high OM1/ramus intermediate with 3.0 x 23 mm Xience Sierra DES/S/p CABG 07/03/2017   Abnormal nuclear stress test 07/03/2017   Weakness 06/13/2017   Bad posture 09/08/2014   Leg weakness, bilateral 09/08/2014   Anemia 03/07/2012   Other general symptoms(780.99) 03/07/2012   GERD (gastroesophageal reflux disease) 03/07/2012   Personal history of malignant  neoplasm of rectum, rectosigmoid junction, and anus 03/07/2012   Hx of CABG 03/07/2012   Chronic diastolic heart failure (HCC) 12/06/2011   Fatigue 04/03/2011   Aortic aneurysm (HCC) 04/03/2011   SYNCOPE 02/21/2011   PALPITATIONS 02/21/2011   Bradycardia by electrocardiogram 01/22/2011   ATRIAL FIBRILLATION 01/23/2010   Obstructive sleep apnea 01/16/2010   Dyspnea on exertion 05/12/2009   HYPERCHOLESTEROLEMIA 03/22/2009   HYPERLIPIDEMIA 03/22/2009   OBESITY 03/22/2009   Essential hypertension 03/22/2009   Coronary atherosclerosis 03/22/2009   BUNDLE BRANCH BLOCK, RIGHT 03/22/2009   GERD 03/22/2009   BPH (benign prostatic hyperplasia) 03/22/2009   Personal history of other specified diseases(V13.89) 03/22/2009   History of colonic polyps 01/22/2000    Past Surgical History:  Procedure Laterality Date   CARDIOVERSION N/A 05/23/2021   Procedure: CARDIOVERSION;  Surgeon: Elmira Newman PARAS, MD;  Location: [facility];  Service: Cardiovascular;  Laterality: N/A;   COLECTOMY  1992   w/ colostomy  related to colon cancer   COLON SURGERY  1993   to take down Colostomy   COLONOSCOPY  09/18/2020   CORONARY ARTERY BYPASS GRAFT  2007   CORONARY STENT INTERVENTION N/A 10/07/2018   Procedure: CORONARY STENT INTERVENTION;  Surgeon: Ladona Heinz, MD;  Location: [facility] INVASIVE CV LAB;  Service: Cardiovascular;  Laterality: N/A;   HAND LIGAMENT RECONSTRUCTION  1963 1964   tendon repair and nerve; left   INGUINAL HERNIA REPAIR  2009   LAPAROSCOPIC CHOLECYSTECTOMY  2004   PATENT FORAMEN OVALE(PFO) CLOSURE N/A 06/13/2021   Procedure: PATENT FORAMEN OVALE (PFO) CLOSURE;  Surgeon: Ladona Heinz, MD;  Location: [facility]AS[facility] CV LAB;  Service: Cardiovascular;  Laterality: N/A;   POLYPECTOMY     RIGHT HEART CATH N/A 06/13/2021   Procedure: RIGHT HEART CATH;  Surgeon: Ladona Heinz, MD;  Location: [facility] INVASIVE CV LAB;  Service: Cardiovascular;  Laterality: N/A;   RIGHT/LEFT HEART CATH AND CORONARY/GRAFT  ANGIOGRAPHY N/A 07/03/2017   Procedure: Right/Left Heart Cath and Coronary/Graft Angiography;  Surgeon: Anner Alm ORN, MD;  Location: [facility] INVASIVE CV LAB;  Service: Cardiovascular;  Laterality: N/A;   RIGHT/LEFT HEART CATH AND CORONARY/GRAFT ANGIOGRAPHY N/A 10/07/2018   Procedure: RIGHT/LEFT HEART CATH AND CORONARY/GRAFT ANGIOGRAPHY;  Surgeon: Ladona Heinz, MD;  Location: [facility] INVAS[facility] CV LAB;  Service: Cardiovascular;  Laterality: N/A;   TEE WITHOUT CARDIOVERSION  05/23/2021   Procedure: TRANSESOPHAGEAL ECHOCARDIOGRAM (TEE);  Surgeon: Elmira Newman PARAS, MD;  Location: [facility] ENDOSCOPY;  Service: Cardiovascular;;   UMBILICAL HERNIA REPAIR     VENTRAL HERNIA REPAIR  2009       Home Medications    Prior to Admission medications   Medication Sig Start Date End Date Taking? Authorizing Provider  albuterol  (  PROVENTIL ) (2.5 MG/3ML) 0.083% nebulizer solution USE 1 VIAL IN NEBULIZER EVERY 6 HOURS AS NEEDED FOR WHEEZING OR SHORTNESS OF BREATH 05/23/24   Shelah Lamar RAMAN, MD  albuterol  (VENTOLIN  HFA) 108 (90 Base) MCG/ACT inhaler Inhale 2 puffs into the lungs every 6 (six) hours as needed for wheezing or shortness of breath. 02/12/24   Shelah Lamar RAMAN, MD  apixaban  (ELIQUIS ) 5 MG TABS tablet Take 1 tablet by mouth twice daily 04/20/24   Ladona Heinz, MD  atorvastatin  (LIPITOR) 10 MG tablet Take 1 tablet (10 mg total) by mouth at bedtime. 08/04/24   Ladona Heinz, MD  clonazePAM  (KLONOPIN ) 1 MG tablet Take 1 mg by mouth at bedtime as needed (sleep). 08/20/20   [provider]  ezetimibe  (ZETIA ) 10 MG tablet Take 1 tablet (10 mg total) by mouth daily. 08/04/24 11/02/24  Ladona Heinz, MD  fluticasone  (FLONASE ) 50 MCG/ACT nasal spray Place 2 sprays into both nostrils daily. 01/22/19   McQuaid, Douglas B, MD  gabapentin  (NEURONTIN ) 300 MG capsule Take 300 mg by mouth 2 (two) times daily. 12/28/23   [provider]  guaiFENesin (MUCINEX) 600 MG 12 hr tablet Take 600 mg by mouth daily as needed (bronchitis).     [provider]  HYDROcodone -acetaminophen  (NORCO/VICODIN) 5-325 MG tablet Take 1 tablet by mouth every 6 (six) hours as needed for moderate pain.    [provider]  Magnesium 200 MG TABS Take 200 mg by mouth in the morning.    [provider]  metoprolol  succinate (TOPROL -XL) 25 MG 24 hr tablet Take 25 mg by mouth daily. Patient not taking: Reported on 08/18/2024    [provider]  metoprolol  tartrate (LOPRESSOR ) 25 MG tablet Take 25 mg by mouth 2 (two) times daily. 08/15/24   [provider]  Multiple Vitamin (MULTIVITAMIN WITH MINERALS) TABS tablet Take 1 tablet by mouth in the morning.    [provider]  nitroGLYCERIN  (NITROSTAT ) 0.4 MG SL tablet Place 1 tablet (0.4 mg total) under the tongue every 5 (five) minutes as needed for chest pain. 11/06/22   Ladona Heinz, MD  Omega-3 Fatty Acids (FISH OIL) 1000 MG CAPS Take 1,000 mg by mouth in the morning.    [provider]  omeprazole  (PRILOSEC) 20 MG capsule Take 1 capsule (20 mg total) by mouth daily. 03/26/21   Pearlean Manus, MD  potassium chloride  SA (KLOR-CON  M) 20 MEQ tablet Take 1 tablet (20 mEq total) by mouth daily. 08/04/24   Ladona Heinz, MD  tamsulosin  (FLOMAX ) 0.4 MG CAPS capsule Take 0.8 mg by mouth at bedtime.    [provider]  tiZANidine  (ZANAFLEX ) 4 MG tablet Take 4 mg by mouth at bedtime as needed for muscle spasms.    [provider]  torsemide (DEMADEX) 20 MG tablet Take 40 mg by mouth in the morning. 01/26/21   [provider]    Family History Family History  Problem Relation Age of Onset   Lung cancer Mother    Heart disease Mother    Emphysema Father    Heart disease Father    Cancer Sister 60       unknown type   Breast cancer Sister        dx. >50   Colon cancer Sister        dx. >50   Breast cancer Sister        dx. >50   Colon cancer Sister        dx. >50  Colon cancer Cousin        dx. early 66s   Esophageal cancer  Neg Hx    Rectal cancer Neg Hx    Stomach cancer Neg Hx    Colon polyps Neg Hx     Social History Social History   Tobacco Use   Smoking status: Former    Current packs/day: 0.00    Average packs/day: 1 pack/day for 45.0 years (45.0 ttl pk-yrs)    Types: Cigarettes    Start date: 3    Quit date: 12/18/1995    Years since quitting: 28.7   Smokeless tobacco: Never  Vaping Use   Vaping status: Never Used  Substance Use Topics   Alcohol use: No    Comment: quit in 1980   Drug use: No     Allergies   Morphine  sulfate, Rosuvastatin  calcium , and Tramadol   Review of Systems Review of Systems Per HPI  Physical Exam Triage Vital Signs ED Triage Vitals  Encounter Vitals Group     BP 08/30/24 1426 110/68     Girls Systolic BP Percentile --      Girls Diastolic BP Percentile --      Boys Systolic BP Percentile --      Boys Diastolic BP Percentile --      Pulse Rate 08/30/24 1426 84     Resp 08/30/24 1426 20     Temp 08/30/24 1426 98.7 F (37.1 C)     Temp Source 08/30/24 1426 Oral     SpO2 08/30/24 1426 93 %     Weight --      Height --      Head Circumference --      Peak Flow --      Pain Score 08/30/24 1421 5     Pain Loc --      Pain Education --      Exclude from Growth Chart --    No data found.  Updated Vital Signs BP 110/68 (BP Location: Right Arm)   Pulse 84   Temp 98.7 F (37.1 C) (Oral)   Resp 20   SpO2 93%   Visual Acuity Right Eye Distance:   Left Eye Distance:   Bilateral Distance:    Right Eye Near:   Left Eye Near:    Bilateral Near:     Physical Exam Vitals and nursing note reviewed.  Constitutional:      Appearance: He is well-developed.  HENT:     Head: Normocephalic and atraumatic.     Right Ear: Tympanic membrane, ear canal and external ear normal.     Left Ear: Tympanic membrane, ear canal and external ear normal.     Nose: Congestion present.     Right Turbinates: Enlarged and swollen.     Left Turbinates: Enlarged  and swollen.     Right Sinus: Maxillary sinus tenderness and frontal sinus tenderness present.     Left Sinus: No maxillary sinus tenderness or frontal sinus tenderness.     Mouth/Throat:     Mouth: Mucous membranes are moist.     Pharynx: Posterior oropharyngeal erythema present.     Comments: Cobblestoning present to posterior oropharynx  Eyes:     General: Lids are normal.     Extraocular Movements:     Right eye: Normal extraocular motion and no nystagmus.     Conjunctiva/sclera: Conjunctivae normal.     Right eye: Right conjunctiva is not injected.     Pupils: Pupils are equal, round,  and reactive to light.     Comments: Mild swelling noted to the right upper eyelid.  Neck:     Thyroid : No thyromegaly.     Trachea: No tracheal deviation.  Cardiovascular:     Rate and Rhythm: Normal rate and regular rhythm.     Heart sounds: Normal heart sounds.  Pulmonary:     Effort: Pulmonary effort is normal.     Breath sounds: Normal breath sounds.  Abdominal:     General: Bowel sounds are normal. There is no distension.     Palpations: Abdomen is soft.     Tenderness: There is no abdominal tenderness.  Musculoskeletal:     Cervical back: Normal range of motion and neck supple.  Skin:    General: Skin is warm and dry.  Neurological:     Mental Status: He is alert and oriented to person, place, and time.  Psychiatric:        Behavior: Behavior normal.        Thought Content: Thought content normal.        Judgment: Judgment normal.      UC Treatments / Results  Labs (all labs ordered are listed, but only abnormal results are displayed) Labs Reviewed  POC SOFIA SARS ANTIGEN FIA    EKG   Radiology No results found.  Procedures Procedures (including critical care time)  Medications Ordered in UC Medications - No data to display  Initial Impression / Assessment and Plan / UC Course  I have reviewed the triage vital signs and the nursing notes.  Pertinent labs &  imaging results that were available during my care of the patient were reviewed by me and considered in my medical decision making (see chart for details).  The COVID test was negative.  Given that patient has had facial swelling and pain, we will treat empirically for acute sinusitis with doxycycline  100 mg.  Patient advised to continue Flonase  he is currently using.  Supportive care recommendations were provided discussed with the patient to include fluids, rest, over-the-counter analgesics such as Tylenol , warm salt water gargles, and use of normal saline nasal spray.  Patient was advised if symptoms fail to improve, recommend follow-up with his PCP for further evaluation.  Patient was in agreement with this plan of care and verbalizes understanding.  All questions were answered.  Patient stable for discharge.   Final Clinical Impressions(s) / UC Diagnoses   Final diagnoses:  None   Discharge Instructions   None    ED Prescriptions   None    PDMP not reviewed this encounter.   Gilmer Etta PARAS, NP 08/30/24 [phone]

## 2024-08-30 NOTE — ED Triage Notes (Signed)
 Sore throat, sinus pressure , and right eye swelling x 3 days     Took mucinex today but no relief

## 2024-09-01 DIAGNOSIS — Z87898 Personal history of other specified conditions: Secondary | ICD-10-CM | POA: Diagnosis not present

## 2024-09-01 DIAGNOSIS — J029 Acute pharyngitis, unspecified: Secondary | ICD-10-CM | POA: Diagnosis not present

## 2024-09-01 DIAGNOSIS — G8929 Other chronic pain: Secondary | ICD-10-CM | POA: Diagnosis not present

## 2024-09-06 NOTE — Progress Notes (Unsigned)
 Cardiology Office Note   Date:  09/07/2024  ID:  [name], DOB 07-07-1941, MRN [id number] PCP: Arloa Elsie SAUNDERS, MD  [facility] Providers Cardiologist:  Gordy Bergamo, MD Electrophysiologist:  Donnice DELENA Primus, MD   History of Present Illness [name] is a 83 y.o. 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 Bergamo for management of sick sinus syndrome.  He was last seen 08/04/24.  Prior to that visit he had marked fatigue and bradycardia so his metoprolol  was reduced and a 14-day monitor applied.  He started to have palpitations and elevated heart rate so his Toprol -XL was increased back to 25 mg daily.  14-day monitor with short runs of SVT, no AF, and heart rate range 48-112 with an average of 81 bpm.  He had a few short runs of NSVT.  The longest SVT episode was 20 seconds.  Some seem more consistent with A. tach while others look like potentially flutter.  They were fairly regular but there was a stuttering to some of the starting and stopping episodes.  [name] called on 08/18/2024 with complaints of bradycardia and associated chest tightness that happens a few times per week and lasted about a minute per episode.  He is currently taking metoprolol  tartrate 25 mg twice daily.  He would take occasional nitroglycerin  with no relief because by the time he took it episodes had resolved.  Today he reports that he is still having episodes a few times a week that are less than a minute duration and are associated with chest tightness but not pain.  He has had several these episodes that correlate to a heart rate in the mid 40s but others are in the 50s/60s.  Symptoms do not occur with exertion but  are always when he sitting still.  ROS: chest pressure at rest, bradycardia   Studies Reviewed  14 day monitor Result date: 05/03/24-05/17/24 Predominant underlying rhythm was sinus rhythm with first-degree AV block.  Minimum heart rate 48 bpm at 1:30 AM and maximum 112 bpm at 12:59 PM with average heart rate of 81 bpm. There were 2 NSVT episodes, longest 13 beats.  Both episodes during awake hours. There were frequent SVT episodes (3016) with peak heart rate of 222 bpm.  Longest 20.2 seconds, fastest 14 seconds at ventricular rate of 202 bpm. Burden could not be determined. EKG suggests either brief atrial tachycardia or atrial flutter. There are occasional PACs (5%), PVCs, ventricular couplets.  PVC burden <3%. There were no patient triggered events.  There was no heart block, no sustained atrial fibrillation.  ECG review 04/30/24: NSR 72, PR 236, QRS 148, QT/c 410/448, RBBB, 1' AVB 03/25/21 (14:58:45): atypical AFL/VR 74, QRS 150, QT/c 379/421,   TTE Result date: 03/20/22 Left ventricle cavity is normal in size. Moderate concentric hypertrophy of the left ventricle. Normal global wall motion. Normal LV systolic function with visual EF 50-55%. Doppler evidence of grade I (impaired) diastolic dysfunction, normal LAP. The aortic root is upper normal at 3.8 cm. No significant valvular abnormality. Previous study in 2018 noted mild LA dilatation, aortic root 4.1 cm.  TEE Result date: 05/23/21 1. Left ventricular ejection fraction, by estimation, is 35 to 40%. The left  ventricle has moderately decreased function. The left ventricle demonstrates global hypokinesis. 2. Right ventricular systolic function is moderately reduced. The right ventricular size is moderately enlarged. 3. Left atrial size was severely dilated. No left atrial/left atrial appendage thrombus was detected. 4. Right atrial size was moderately dilated. 5. The mitral valve is grossly normal. Mild mitral valve  regurgitation. 6. The aortic valve is tricuspid. Aortic valve regurgitation is not visualized. 7. Evidence of atrial level shunting detected by color flow Doppler. Septum primum, as well as septum secundum defects seen. 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 Multiple ASD's are noted. 8. No prior TEE for comparison.     Risk Assessment/Calculations  CHA2DS2-VASc Score = 4  This indicates a 4.8% annual risk of stroke. The patient's score is based upon: CHF History: 1 HTN History: 0 Diabetes History: 0 Stroke History: 0 Vascular Disease History: 1 Age Score: 2 Gender Score: 0  Physical Exam VS:  BP 92/64   Pulse 74   Ht 5' 11 (1.803 m)   Wt 234 lb 1.6 oz (106.2 kg)   SpO2 94%   BMI 32.65 kg/m       Wt Readings from Last 3 Encounters:  09/07/24 234 lb 1.6 oz (106.2 kg)  08/05/24 237 lb 12.8 oz (107.9 kg)  08/04/24 237 lb 6.4 oz (107.7 kg)    GEN: Well nourished, well developed in no acute distress NECK: No JVD; No carotid bruits CARDIAC: RRR, no murmurs, rubs, gallops RESPIRATORY:  Clear to auscultation without rales, wheezing or rhonchi  ABDOMEN: Soft, non-tender, non-distended EXTREMITIES:  No edema; No deformity   ASSESSMENT AND PLAN [name] is a 83 y.o. 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 Ladona for management of sick sinus syndrome.   Paroxysmal AF  Atypical AFL  ? SSS I reviewed the prior Holter monitor which demonstrated a heart rate range of 48-112 with a mean of 81.  He had no episodes of atrial fibrillation.  He had a few isolated episodes of either atrial tachycardia and/or atrial flutter that were brief.  He  is on anticoagulation for distant prior AF/AFL.  He has not required cardioversion since 2022.  He recently had a monitor 3 months ago however he says his symptoms have picked up since then and that he was not symptomatic during that monitor.  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  We discussed different options for evaluation management of possible symptomatic bradycardia.  I currently do not have an indication for the PPM implant as I cannot correlate his symptoms with bradycardia on prior monitor.  He reports chest pressure associated with bradycardic episodes at rest with heart rates in the 40s.  Will send another monitor today and he will flag symptoms to see if they correlate.  Episodes of AT/possible AFL were brief on his prior monitor so for now no intervention. Will f/u after repeat monitor completed.  His symptoms do not seem consistent with an anginal equivalent as they are unrelated to exertion and less than a minute in duration at rest. All of his sx occur when he is at rest.    Dispo: RTC 3 months  A total of 45 minutes was spent reviewing charts, patient evaluation and discussion, and with treatment plan formulation. 20 minutes was spent with direct patient interaction including physical exam.  Signed, Donnice DELENA Primus, MD

## 2024-09-07 ENCOUNTER — Ambulatory Visit
Attending: Student in an Organized Health Care Education/Training Program | Admitting: Student in an Organized Health Care Education/Training Program

## 2024-09-07 ENCOUNTER — Ambulatory Visit

## 2024-09-07 ENCOUNTER — Encounter: Payer: Self-pay | Admitting: Student in an Organized Health Care Education/Training Program

## 2024-09-07 VITALS — BP 92/64 | HR 74 | Ht 71.0 in | Wt 234.1 lb

## 2024-09-07 DIAGNOSIS — I480 Paroxysmal atrial fibrillation: Secondary | ICD-10-CM | POA: Diagnosis not present

## 2024-09-07 DIAGNOSIS — R001 Bradycardia, unspecified: Secondary | ICD-10-CM | POA: Diagnosis not present

## 2024-09-07 NOTE — Patient Instructions (Signed)
 Medication Instructions:  Your physician recommends that you continue on your current medications as directed. Please refer to the Current Medication list given to you today.  *If you need a refill on your cardiac medications before your next appointment, please call your pharmacy*  Lab Work: None ordered.  If you have labs (blood work) drawn today and your tests are completely normal, you will receive your results only by: MyChart Message (if you have MyChart) OR A paper copy in the mail If you have any lab test that is abnormal or we need to change your treatment, we will call you to review the results.  Testing/Procedures: [name]- Long Term Monitor Instructions  Your physician has requested you wear a ZIO patch monitor for 14 days.  This is a single patch monitor. [employer] supplies one patch monitor per enrollment. Additional stickers are not available. Please do not apply patch if you will be having a Nuclear Stress Test,  Echocardiogram, Cardiac CT, MRI, or Chest Xray during the period you would be wearing the  monitor. The patch cannot be worn during these tests. You cannot remove and re-apply the  ZIO XT patch monitor.  Your ZIO patch monitor will be mailed 3 day USPS to your address on file. It may take 3-5 days  to receive your monitor after you have been enrolled.  Once you have received your monitor, please review the enclosed instructions. Your monitor  has already been registered assigning a specific monitor serial # to you.  Billing and Patient Assistance Program Information  We have supplied [employer] with any of your insurance information on file for billing purposes. [employer] offers a sliding scale Patient Assistance Program for patients that do not have  insurance, or whose insurance does not completely cover the cost of the ZIO monitor.  You must apply for the Patient Assistance Program to qualify for this discounted rate.  To apply, please call [employer] at  [phone], select option 4, select option 2, ask to apply for  Patient Assistance Program. [name] will ask your household income, and how many people  are in your household. They will quote your out-of-pocket cost based on that information.  [employer] will also be able to set up a 52-month, interest-free payment plan if needed.  Applying the monitor   Shave hair from upper left chest.  Hold abrader disc by orange tab. Rub abrader in 40 strokes over the upper left chest as  indicated in your monitor instructions.  Clean area with 4 enclosed alcohol pads. Let dry.  Apply patch as indicated in monitor instructions. Patch will be placed under collarbone on left  side of chest with arrow pointing upward.  Rub patch adhesive wings for 2 minutes. Remove white label marked 1. Remove the white  label marked 2. Rub patch adhesive wings for 2 additional minutes.  While looking in a mirror, press and release button in center of patch. A small green light will  flash 3-4 times. This will be your only indicator that the monitor has been turned on.  Do not shower for the first 24 hours. You may shower after the first 24 hours.  Press the button if you feel a symptom. You will hear a small click. Record Date, Time and  Symptom in the Patient Logbook.  When you are ready to remove the patch, follow instructions on the last 2 pages of Patient  Logbook. Stick patch monitor onto the last page of Patient Logbook.  Place Patient Logbook in  the blue and white box. Use locking tab on box and tape box closed  securely. The blue and white box has prepaid postage on it. Please place it in the mailbox as  soon as possible. Your physician should have your test results approximately 7 days after the  monitor has been mailed back to [facility].  Call [facility] Customer Care at [phone] if you have questions regarding  your ZIO XT patch monitor. Call them immediately if you see an orange light  blinking on your  monitor.  If your monitor falls off in less than 4 days, contact our Monitor department at [phone].  If your monitor becomes loose or falls off after 4 days call [employer] at [phone] for  suggestions on securing your monitor   Follow-Up: At [facility], you and your health needs are our priority.  As part of our continuing mission to provide you with exceptional heart care, our providers are all part of one team.  This team includes your primary Cardiologist (physician) and Advanced Practice Providers or APPs (Physician Assistants and Nurse Practitioners) who all work together to provide you with the care you need, when you need it.  Your next appointment:   3 months with Dr Almetta

## 2024-09-07 NOTE — Progress Notes (Unsigned)
 Enrolled patient for a 14 day Zio XT  monitor to be mailed to patients home

## 2024-09-15 DIAGNOSIS — E7800 Pure hypercholesterolemia, unspecified: Secondary | ICD-10-CM | POA: Diagnosis not present

## 2024-09-15 DIAGNOSIS — I10 Essential (primary) hypertension: Secondary | ICD-10-CM | POA: Diagnosis not present

## 2024-09-15 DIAGNOSIS — I4819 Other persistent atrial fibrillation: Secondary | ICD-10-CM | POA: Diagnosis not present

## 2024-09-15 DIAGNOSIS — J410 Simple chronic bronchitis: Secondary | ICD-10-CM | POA: Diagnosis not present

## 2024-09-15 DIAGNOSIS — I5032 Chronic diastolic (congestive) heart failure: Secondary | ICD-10-CM | POA: Diagnosis not present

## 2024-09-15 DIAGNOSIS — N1831 Chronic kidney disease, stage 3a: Secondary | ICD-10-CM | POA: Diagnosis not present

## 2024-09-15 NOTE — Progress Notes (Deleted)
 Established Patient Pulmonology Office Visit   Subjective:  Patient ID: [name], male    DOB: Oct 17, 1941  MRN: [id number]  CC: No chief complaint on file.   HPI  [name] is an 83 y/o man with a PMH significant for GERD, COPD, chronic cough, and OSA who presents for evaluation of the latter.  He sees both Dr. Shelah and Dr. Darlean in pulmonary office. He is seeing me today to discuss sleep disordered breathing.  PSG completed in 2019 show AHI > 15 indicative of moderate OSA. CPAP titration performed and optimal CPAP pressure was 11 cm H2O.  Sleep history The patient goes to bed at *** PM and falls asleep within *** minutes. They have *** awakenings at night due to ***. They do/do not fall asleep easily after returning to bed. The patient does/does not report snoring. The patient does/does not report witnessed apneas. The patient does/does not report morning headaches or dry mouth. The patient does/does not complain of excessive daytime sleepiness. The patient does/does not take naps. The patient gets out of bed at ***AM. The patient does/does not drink *** cups of coffee per day.   The Epworth Sleepiness score is ***/24.   {DUNEAJWH:66350}   {PULM QUESTIONNAIRES (Optional):33196}  ROS  {History (Optional):23778}  Current Outpatient Medications:    albuterol  (PROVENTIL ) (2.5 MG/3ML) 0.083% nebulizer solution, USE 1 VIAL IN NEBULIZER EVERY 6 HOURS AS NEEDED FOR WHEEZING OR SHORTNESS OF BREATH, Disp: 90 mL, Rfl: 0   albuterol  (VENTOLIN  HFA) 108 (90 Base) MCG/ACT inhaler, Inhale 2 puffs into the lungs every 6 (six) hours as needed for wheezing or shortness of breath., Disp: 8 g, Rfl: 6   apixaban  (ELIQUIS ) 5 MG TABS tablet, Take 1 tablet by mouth twice daily, Disp: 180 tablet, Rfl: 1   atorvastatin  (LIPITOR) 10 MG tablet, Take 1 tablet (10 mg total) by mouth at bedtime., Disp: 90 tablet, Rfl: 3   clonazePAM  (KLONOPIN ) 1 MG tablet, Take 1 mg by mouth at bedtime as needed  (sleep)., Disp: , Rfl:    ezetimibe  (ZETIA ) 10 MG tablet, Take 1 tablet (10 mg total) by mouth daily., Disp: 90 tablet, Rfl: 3   fluticasone  (FLONASE ) 50 MCG/ACT nasal spray, Place 2 sprays into both nostrils daily., Disp: 16 g, Rfl: 2   gabapentin  (NEURONTIN ) 300 MG capsule, Take 300 mg by mouth 2 (two) times daily., Disp: , Rfl:    guaiFENesin (MUCINEX) 600 MG 12 hr tablet, Take 600 mg by mouth daily as needed (bronchitis)., Disp: , Rfl:    HYDROcodone -acetaminophen  (NORCO/VICODIN) 5-325 MG tablet, Take 1 tablet by mouth every 6 (six) hours as needed for moderate pain., Disp: , Rfl:    Magnesium 200 MG TABS, Take 200 mg by mouth in the morning., Disp: , Rfl:    metoprolol  succinate (TOPROL -XL) 25 MG 24 hr tablet, Take 25 mg by mouth daily. (Patient not taking: Reported on 09/07/2024), Disp: , Rfl:    metoprolol  tartrate (LOPRESSOR ) 25 MG tablet, Take 25 mg by mouth 2 (two) times daily., Disp: , Rfl:    Multiple Vitamin (MULTIVITAMIN WITH MINERALS) TABS tablet, Take 1 tablet by mouth in the morning., Disp: , Rfl:    nitroGLYCERIN  (NITROSTAT ) 0.4 MG SL tablet, Place 1 tablet (0.4 mg total) under the tongue every 5 (five) minutes as needed for chest pain., Disp: 25 tablet, Rfl: 3   Omega-3 Fatty Acids (FISH OIL) 1000 MG CAPS, Take 1,000 mg by mouth in the morning., Disp: , Rfl:  omeprazole  (PRILOSEC) 20 MG capsule, Take 1 capsule (20 mg total) by mouth daily., Disp: 30 capsule, Rfl: 3   potassium chloride  SA (KLOR-CON  M) 20 MEQ tablet, Take 1 tablet (20 mEq total) by mouth daily., Disp: 90 tablet, Rfl: 3   tamsulosin  (FLOMAX ) 0.4 MG CAPS capsule, Take 0.8 mg by mouth at bedtime., Disp: , Rfl:    tiZANidine  (ZANAFLEX ) 4 MG tablet, Take 4 mg by mouth at bedtime as needed for muscle spasms., Disp: , Rfl:    torsemide (DEMADEX) 20 MG tablet, Take 40 mg by mouth in the morning., Disp: , Rfl:       Objective:  There were no vitals taken for this visit. {Pulm Vitals (Optional):32837}  Physical  Exam   Diagnostic Review:  {Labs (Optional):32838}     Assessment & Plan:   Assessment & Plan   No orders of the defined types were placed in this encounter.     No follow-ups on file.   Yosgar Demirjian, MD

## 2024-09-16 ENCOUNTER — Encounter: Payer: Self-pay | Admitting: Pulmonary Disease

## 2024-09-16 ENCOUNTER — Ambulatory Visit (INDEPENDENT_AMBULATORY_CARE_PROVIDER_SITE_OTHER): Admitting: Pulmonary Disease

## 2024-09-16 ENCOUNTER — Ambulatory Visit: Admitting: Pulmonary Disease

## 2024-09-16 VITALS — BP 145/78 | HR 75 | Ht 71.0 in | Wt 241.2 lb

## 2024-09-16 DIAGNOSIS — J42 Unspecified chronic bronchitis: Secondary | ICD-10-CM | POA: Diagnosis not present

## 2024-09-16 DIAGNOSIS — G4733 Obstructive sleep apnea (adult) (pediatric): Secondary | ICD-10-CM

## 2024-09-16 MED ORDER — BREZTRI AEROSPHERE 160-9-4.8 MCG/ACT IN AERO: 2.0000 | INHALATION_SPRAY | Freq: Two times a day (BID) | RESPIRATORY_TRACT | 6 refills | Status: AC

## 2024-09-16 MED ORDER — BREZTRI AEROSPHERE 160-9-4.8 MCG/ACT IN AERO: 2.0000 | INHALATION_SPRAY | Freq: Two times a day (BID) | RESPIRATORY_TRACT | Status: AC

## 2024-09-16 NOTE — Assessment & Plan Note (Signed)
 The patient with know hx of OSA but has not been on treatment for at least 3 years. The patient has symptoms c/w sleep disordered breathing including fatigue, daytime somnolence etc... His ESS is 7, Mallampati IV, neck circ 19.5 inches. Will repeat split night sleep study to evaluate formally for OSA and do CPAP titration same night. The patient is agreeable.

## 2024-09-16 NOTE — Patient Instructions (Signed)
-   Please start Breztri 2 puffs twice daily - Can continue albuterol  as needed - Get breathing and sleep test done soon - Follow up in 2 months.

## 2024-09-16 NOTE — Progress Notes (Signed)
 Established Patient Pulmonology Office Visit   Subjective:  Patient ID: [name], male    DOB: Nov 01, 1941  MRN: [id number]  CC:  Chief Complaint  Patient presents with   Establish Care   Sleep Apnea    Non compliant, can not tolerate     HPI  [name] is an 83 y/o man with a PMH significant for GERD, COPD, chronic cough, and OSA who presents for evaluation of the latter.  He sees both Dr. Shelah and Dr. Darlean in pulmonary office. He is seeing me today to discuss sleep disordered breathing.  PSG completed in 2019 show AHI > 15 indicative of moderate OSA. CPAP titration performed and optimal CPAP pressure was 11 cm H2O.  He had one years ago and quit wearing it. He stays tired all the time. He is short of breath. He sleeps good most of the time. Does not take naps. The last time he had the CPAP machine was 4-5 years ago. Could not sleep and wresting with it. Nasal pillows previously. No weight change.   Sleep history The patient goes to bed at 10:30 - 12 PM and falls asleep within 10 - 15 minutes. They have 2-3 awakenings at night due to bathroom. They do fall asleep easily after returning to bed. The patient does report snoring. The patient does report witnessed apneas. The patient does report dry mouth. The patient does of excessive daytime sleepiness. The patient does not take naps. The patient gets out of bed at 7 AM. The patient does not drink coffee  The Epworth Sleepiness score is 7/24.   STOPBANG: Snoring Loudly, Fatigue, Observed Apneas, Hypertension, Age > 50, Neck Circumference > 16 inches or 40 cm, and Male Gender  PMH: - HTN - COPD  4 exacerbations annually MMRC > 2  Social Hx: - Quit in 1997, 1 pack a day x 45 years = 45 PY     09/16/2024    1:00 PM  Results of the Epworth flowsheet  Sitting and reading 2  Watching TV 2  Sitting, inactive in a public place (e.g. a theatre or a meeting) 0  As a passenger in a car for an hour without a break 3  Lying  down to rest in the afternoon when circumstances permit 0  Sitting and talking to someone 0  Sitting quietly after a lunch without alcohol 0  In a car, while stopped for a few minutes in traffic 0  Total score 7    ROS    Current Outpatient Medications:    albuterol  (PROVENTIL ) (2.5 MG/3ML) 0.083% nebulizer solution, USE 1 VIAL IN NEBULIZER EVERY 6 HOURS AS NEEDED FOR WHEEZING OR SHORTNESS OF BREATH, Disp: 90 mL, Rfl: 0   albuterol  (VENTOLIN  HFA) 108 (90 Base) MCG/ACT inhaler, Inhale 2 puffs into the lungs every 6 (six) hours as needed for wheezing or shortness of breath., Disp: 8 g, Rfl: 6   apixaban  (ELIQUIS ) 5 MG TABS tablet, Take 1 tablet by mouth twice daily, Disp: 180 tablet, Rfl: 1   atorvastatin  (LIPITOR) 10 MG tablet, Take 1 tablet (10 mg total) by mouth at bedtime., Disp: 90 tablet, Rfl: 3   budesonide-glycopyrrolate-formoterol (BREZTRI AEROSPHERE) 160-9-4.8 MCG/ACT AERO inhaler, Inhale 2 puffs into the lungs in the morning and at bedtime., Disp: 1 each, Rfl: 6   budesonide-glycopyrrolate-formoterol (BREZTRI AEROSPHERE) 160-9-4.8 MCG/ACT AERO inhaler, Inhale 2 puffs into the lungs in the morning and at bedtime for 1 day., Disp: , Rfl:    clonazePAM  (  KLONOPIN ) 1 MG tablet, Take 1 mg by mouth at bedtime as needed (sleep)., Disp: , Rfl:    ezetimibe  (ZETIA ) 10 MG tablet, Take 1 tablet (10 mg total) by mouth daily., Disp: 90 tablet, Rfl: 3   fluticasone  (FLONASE ) 50 MCG/ACT nasal spray, Place 2 sprays into both nostrils daily., Disp: 16 g, Rfl: 2   gabapentin  (NEURONTIN ) 300 MG capsule, Take 300 mg by mouth 2 (two) times daily., Disp: , Rfl:    guaiFENesin (MUCINEX) 600 MG 12 hr tablet, Take 600 mg by mouth daily as needed (bronchitis)., Disp: , Rfl:    HYDROcodone -acetaminophen  (NORCO/VICODIN) 5-325 MG tablet, Take 1 tablet by mouth every 6 (six) hours as needed for moderate pain., Disp: , Rfl:    Magnesium 200 MG TABS, Take 200 mg by mouth in the morning., Disp: , Rfl:    metoprolol   tartrate (LOPRESSOR ) 25 MG tablet, Take 25 mg by mouth 2 (two) times daily., Disp: , Rfl:    Multiple Vitamin (MULTIVITAMIN WITH MINERALS) TABS tablet, Take 1 tablet by mouth in the morning., Disp: , Rfl:    nitroGLYCERIN  (NITROSTAT ) 0.4 MG SL tablet, Place 1 tablet (0.4 mg total) under the tongue every 5 (five) minutes as needed for chest pain., Disp: 25 tablet, Rfl: 3   Omega-3 Fatty Acids (FISH OIL) 1000 MG CAPS, Take 1,000 mg by mouth in the morning., Disp: , Rfl:    omeprazole  (PRILOSEC) 20 MG capsule, Take 1 capsule (20 mg total) by mouth daily., Disp: 30 capsule, Rfl: 3   potassium chloride  SA (KLOR-CON  M) 20 MEQ tablet, Take 1 tablet (20 mEq total) by mouth daily., Disp: 90 tablet, Rfl: 3   tamsulosin  (FLOMAX ) 0.4 MG CAPS capsule, Take 0.8 mg by mouth at bedtime., Disp: , Rfl:    tiZANidine  (ZANAFLEX ) 4 MG tablet, Take 4 mg by mouth at bedtime as needed for muscle spasms., Disp: , Rfl:    torsemide (DEMADEX) 20 MG tablet, Take 40 mg by mouth in the morning., Disp: , Rfl:    metoprolol  succinate (TOPROL -XL) 25 MG 24 hr tablet, Take 25 mg by mouth daily. (Patient not taking: Reported on 09/16/2024), Disp: , Rfl:       Objective:  BP (!) 145/78   Pulse 75   Ht 5' 11 (1.803 m)   Wt 241 lb 3.2 oz (109.4 kg)   SpO2 97% Comment: ra  BMI 33.64 kg/m  Wt Readings from Last 3 Encounters:  09/16/24 241 lb 3.2 oz (109.4 kg)  09/07/24 234 lb 1.6 oz (106.2 kg)  08/05/24 237 lb 12.8 oz (107.9 kg)   BMI Readings from Last 3 Encounters:  09/16/24 33.64 kg/m  09/07/24 32.65 kg/m  08/05/24 33.17 kg/m   SpO2 Readings from Last 3 Encounters:  09/16/24 97%  09/07/24 94%  08/30/24 93%    Physical Exam  General: NAD, alert, WD, WN Eyes: PERRL, no scleral icterus ENMT: oropharynx clear, good dentition, no oral lesions, mallampati score IV Skin: warm, intact, no rashes Neck: neck circ 19.5 CV: RRR, no MRG, nl S1 and S2, no peripheral edema Resp: clear to auscultation bilaterally, no  wheezes, rales, or rhonchi, normal effort, no clubbing/cyanosis Abdom: Normoactive bowel sounds, soft, nontender, nondistended, no hepatosplenomegaly Neuro: Awake alert oriented to person place time and situation  Diagnostic Review:  Last CBC Lab Results  Component Value Date   WBC 4.7 06/07/2023   HGB 12.6 (L) 06/07/2023   HCT 38.0 (L) 06/07/2023   MCV 85.0 06/07/2023   MCH 28.2 06/07/2023  RDW 15.0 06/07/2023   PLT 142 (L) 06/07/2023   Last metabolic panel Lab Results  Component Value Date   GLUCOSE 123 (H) 06/07/2023   NA 135 06/07/2023   K 3.8 06/07/2023   CL 104 06/07/2023   CO2 23 06/07/2023   BUN 15 06/07/2023   CREATININE 1.43 (H) 06/07/2023   GFRNONAA 49 (L) 06/07/2023   CALCIUM  8.7 (L) 06/07/2023   PROT 7.2 03/25/2021   ALBUMIN 3.7 03/25/2021   BILITOT 0.5 03/25/2021   ALKPHOS 54 03/25/2021   AST 28 03/25/2021   ALT 34 03/25/2021   ANIONGAP 8 06/07/2023   Last PFT performed in 2019 and data not interpretable  CTA chest 08/2023: bilateral lower lobe mosaic attenuation, calcified granuloma in RUL    Assessment & Plan:   Assessment & Plan Obstructive sleep apnea The patient with know hx of OSA but has not been on treatment for at least 3 years. The patient has symptoms c/w sleep disordered breathing including fatigue, daytime somnolence etc... His ESS is 7, Mallampati IV, neck circ 19.5 inches. Will repeat split night sleep study to evaluate formally for OSA and do CPAP titration same night. The patient is agreeable. Chronic bronchitis, unspecified chronic bronchitis type (HCC) Patient diagnosed with COPD and is on PRN albuterol . He notes 4 exacerbations per year. I suspect he is Class E if that's true. He has no maintenance inhaler. Therefore, will start ICS/LAMA/LABA with Breztri. The patient will trial it and if it improves his symptoms, will pick it up from pharmacy. He will continue with as needed albuterol  neb/MDI. I ordered repeat PFT since last one was not  useable.  Orders Placed This Encounter  Procedures   Pulmonary function test   Split night study   I spent 30 minutes reviewing patient's chart including prior consultant notes, imaging, and PFTs as well as face-to-face with the patient, over half in discussion of the diagnosis and the importance of compliance with the treatment plan.  Return in 2 months (on 11/16/2024).   Tharun Cappella, MD

## 2024-09-16 NOTE — Assessment & Plan Note (Signed)
 Patient diagnosed with COPD and is on PRN albuterol . He notes 4 exacerbations per year. I suspect he is Class E if that's true. He has no maintenance inhaler. Therefore, will start ICS/LAMA/LABA with Breztri. The patient will trial it and if it improves his symptoms, will pick it up from pharmacy. He will continue with as needed albuterol  neb/MDI. I ordered repeat PFT since last one was not useable.

## 2024-09-17 ENCOUNTER — Telehealth: Payer: Self-pay | Admitting: Pulmonary Disease

## 2024-09-17 NOTE — Telephone Encounter (Signed)
 Spoke with patient regarding the Tuesday 11/24/24 10:00 am PFT appointment at [facility] time is 9:45 am--1st floor registration desk for check in--follow up appointment Monday 12/07/24 at 2:00 pm--will mail information to patient and he voiced his understanding

## 2024-09-21 ENCOUNTER — Other Ambulatory Visit: Payer: Self-pay | Admitting: Emergency Medicine

## 2024-10-09 DIAGNOSIS — R001 Bradycardia, unspecified: Secondary | ICD-10-CM | POA: Diagnosis not present

## 2024-10-10 ENCOUNTER — Ambulatory Visit
Admission: EM | Admit: 2024-10-10 | Discharge: 2024-10-10 | Disposition: A | Discharge status: Home / Self Care | Attending: Nurse Practitioner | Admitting: Nurse Practitioner

## 2024-10-10 DIAGNOSIS — R509 Fever, unspecified: Secondary | ICD-10-CM

## 2024-10-10 LAB — POC COVID19/FLU A&B COMBO
Covid Antigen, POC: NEGATIVE
Influenza A Antigen, POC: NEGATIVE
Influenza B Antigen, POC: NEGATIVE

## 2024-10-10 NOTE — ED Triage Notes (Signed)
 Pt reports he has a fever and chills since this morning.

## 2024-10-10 NOTE — ED Provider Notes (Signed)
 [facility]    CSN: [id number] Arrival date & time: 10/10/24  1329      History   Chief Complaint Chief Complaint  Patient presents with   Fever    HPI [name] is a 83 y.o. male.   The history is provided by the patient.   Patient presents for complaints of fever and chills that developed this morning.  Tmax around 102.  Patient denies headache, ear pain, cough, wheezing, abdominal pain, nausea, vomiting, diarrhea, or rash.  Patient reports that he did take 2 Advil for his symptoms.  States that he has been at the hospital with another family member.  Past Medical History:  Diagnosis Date   Allergy    Arthritis    BPH (benign prostatic hyperplasia)    CAD (coronary artery disease)    s/p CABG 2007; NSTEMI in setting of AFib with RVR 12/2009; cath 1/11: S-RCA ok with prox 30-40% and 50% stenoses; S-OM and RI  with patent OM limb but an occluded RI limb; S-D2 and L-LAD ok; EF 55%   Cataract    removed both eyes    Clotting disorder    s/p CABG x 5- PE    Colon cancer (HCC) 1992   COPD (chronic obstructive pulmonary disease) (HCC)    Diverticulosis    Essential hypertension    Family history of breast cancer    Family history of colon cancer    Family history of lung cancer    GERD (gastroesophageal reflux disease)    History of pulmonary embolus (PE) 2007   Following CABG   Hyperlipidemia    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HCC)    Right upper   Myocardial infarction (HCC) 2007   NSTEMI, one stent, A fib, blood thinner   OSA on CPAP    Paroxysmal atrial fibrillation (HCC)    Previously on Multaq, declines anticoagulation   Personal history of colonic polyps 01/22/2000   Tubular adenoma   RBBB (right bundle branch block)    Sleep apnea    no cpap    Squamous cell carcinoma of skin 02/11/2008   Bowens-Left paraspinal, lower    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   in situ-left forearm   Stroke (HCC) 2024   MRI showed had one in the past     Patient Active Problem List   Diagnosis Date Noted   Acute bronchitis 08/05/2024   Pain in left wrist 01/02/2022   Joint swelling 01/02/2022   Abdominal aortic aneurysm without rupture 11/14/2021   Anxiety 11/14/2021   Balanitis 11/14/2021   Chronic kidney disease, stage 3a (HCC) 11/14/2021   Chronic pain 11/14/2021   Enlarged prostate 11/14/2021   Extrapyramidal and movement disorder, 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   Sciatica 11/14/2021   Simple chronic bronchitis (HCC) 11/14/2021   Secondary right ventricular dilation    Atrial septal defect 06/12/2021   Acute on chronic combined systolic and diastolic CHF (congestive heart failure) --EF 35 to 40% 03/26/2021   New onset a-fib (HCC) 03/25/2021   Paroxysmal A-fib with RVR 03/25/2021   History of  deep vein thrombosis (DVT)/H/o Provoked DVT in 2007 --after CABG,  03/25/2021   Low back pain 10/20/2020   Pain of right hip joint 10/20/2020   Pneumonia due to COVID-19 virus 10/04/2020   Genetic testing 02/17/2020   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   Family history of colon cancer    Family history of breast cancer    Family history of lung cancer  Allergic rhinitis 07/21/2019   Nasal obstruction 01/22/2019   Post PTCA 10/07/2018   Enrolled in clinical trial of drug 10/07/2018   COPD (chronic obstructive pulmonary disease) (HCC) 08/08/2018   CAD-  S/P stenting of high OM1/ramus intermediate with 3.0 x 23 mm Xience Sierra DES/S/p CABG 07/03/2017   Abnormal nuclear stress test 07/03/2017   Weakness 06/13/2017   Bad posture 09/08/2014   Leg weakness, bilateral 09/08/2014   Anemia 03/07/2012   Other general symptoms(780.99) 03/07/2012   GERD (gastroesophageal reflux disease) 03/07/2012   Personal history of malignant neoplasm of rectum, rectosigmoid junction, and anus 03/07/2012   Hx of CABG 03/07/2012   Chronic diastolic heart failure (HCC) 12/06/2011   Fatigue 04/03/2011   Aortic aneurysm 04/03/2011   SYNCOPE  02/21/2011   PALPITATIONS 02/21/2011   Bradycardia by electrocardiogram 01/22/2011   ATRIAL FIBRILLATION 01/23/2010   Obstructive sleep apnea 01/16/2010   Dyspnea on exertion 05/12/2009   HYPERCHOLESTEROLEMIA 03/22/2009   HYPERLIPIDEMIA 03/22/2009   OBESITY 03/22/2009   Essential hypertension 03/22/2009   Coronary atherosclerosis 03/22/2009   BUNDLE BRANCH BLOCK, RIGHT 03/22/2009   GERD 03/22/2009   BPH (benign prostatic hyperplasia) 03/22/2009   Personal history of other specified diseases(V13.89) 03/22/2009   History of colonic polyps 01/22/2000    Past Surgical History:  Procedure Laterality Date   CARDIOVERSION N/A 05/23/2021   Procedure: CARDIOVERSION;  Surgeon: Elmira Newman PARAS, MD;  Location: [facility];  Service: Cardiovascular;  Laterality: N/A;   COLECTOMY  1992   w/ colostomy  related to colon cancer   COLON SURGERY  1993   to take down Colostomy   COLONOSCOPY  09/18/2020   CORONARY ARTERY BYPASS GRAFT  2007   CORONARY STENT INTERVENTION N/A 10/07/2018   Procedure: CORONARY STENT INTERVENTION;  Surgeon: Ladona Heinz, MD;  Location: [facility]VASIVE CV LAB;  Service: Cardiovascular;  Laterality: N/A;   HAND LIGAMENT RECONSTRUCTION  1963 1964   tendon repair and nerve; left   INGUINAL HERNIA REPAIR  2009   LAPAROSCOPIC CHOLECYSTECTOMY  2004   PATENT FORAMEN OVALE(PFO) CLOSURE N/A 06/13/2021   Procedure: PATENT FORAMEN OVALE (PFO) CLOSURE;  Surgeon: Ladona Heinz, MD;  Location: [facility];  Service: Cardiovascular;  Laterality: N/A;   POLYPECTOMY     RIGHT HEART CATH N/A 06/13/2021   Procedure: RIGHT HEART CATH;  Surgeon: Ladona Heinz, MD;  Location: [facility] INVASIVE CV LAB;  Service: Cardiovascular;  Laterality: N/A;   RIGHT/LEFT HEART CATH AND CORONARY/GRAFT ANGIOGRAPHY N/A 07/03/2017   Procedure: Right/Left Heart Cath and Coronary/Graft Angiography;  Surgeon: Anner Alm ORN, MD;  Location: [facility] INVASIVE CV LAB;  Service: Cardiovascular;  Laterality: N/A;   RIGHT/LEFT  HEART CATH AND CORONARY/GRAFT ANGIOGRAPHY N/A 10/07/2018   Procedure: RIGHT/LEFT HEART CATH AND CORONARY/GRAFT ANGIOGRAPHY;  Surgeon: Ladona Heinz, MD;  Location: [facility];  Service: Cardiovascular;  Laterality: N/A;   TEE WITHOUT CARDIOVERSION  05/23/2021   Procedure: TRANSESOPHAGEAL ECHOCARDIOGRAM (TEE);  Surgeon: Elmira Newman PARAS, MD;  Location: [facility];  Service: Cardiovascular;;   UMBILICAL HERNIA REPAIR     VENTRAL HERNIA REPAIR  2009       Home Medications    Prior to Admission medications   Medication Sig Start Date End Date Taking? Authorizing Provider  albuterol  (PROVENTIL ) (2.5 MG/3ML) 0.083% nebulizer solution USE 1 VIAL IN NEBULIZER EVERY 6 HOURS AS NEEDED FOR WHEEZING OR SHORTNESS OF BREATH 09/21/24   Shelah Lamar RAMAN, MD  albuterol  (VENTOLIN  HFA) 108 (90 Base) MCG/ACT inhaler Inhale 2 puffs into the  lungs every 6 (six) hours as needed for wheezing or shortness of breath. 02/12/24   Shelah Lamar RAMAN, MD  apixaban  (ELIQUIS ) 5 MG TABS tablet Take 1 tablet by mouth twice daily 04/20/24   Ladona Heinz, MD  atorvastatin  (LIPITOR) 10 MG tablet Take 1 tablet (10 mg total) by mouth at bedtime. 08/04/24   Ladona Heinz, MD  budesonide-glycopyrrolate-formoterol (BREZTRI AEROSPHERE) 160-9-4.8 MCG/ACT AERO inhaler Inhale 2 puffs into the lungs in the morning and at bedtime. 09/16/24   Alghanim, Paula, MD  clonazePAM  (KLONOPIN ) 1 MG tablet Take 1 mg by mouth at bedtime as needed (sleep). 08/20/20   [provider]  ezetimibe  (ZETIA ) 10 MG tablet Take 1 tablet (10 mg total) by mouth daily. 08/04/24 11/02/24  Ladona Heinz, MD  fluticasone  (FLONASE ) 50 MCG/ACT nasal spray Place 2 sprays into both nostrils daily. 01/22/19   McQuaid, Douglas B, MD  gabapentin  (NEURONTIN ) 300 MG capsule Take 300 mg by mouth 2 (two) times daily. 12/28/23   [provider]  guaiFENesin (MUCINEX) 600 MG 12 hr tablet Take 600 mg by mouth daily as needed (bronchitis).    [provider]   HYDROcodone -acetaminophen  (NORCO/VICODIN) 5-325 MG tablet Take 1 tablet by mouth every 6 (six) hours as needed for moderate pain.    [provider]  Magnesium 200 MG TABS Take 200 mg by mouth in the morning.    [provider]  metoprolol  succinate (TOPROL -XL) 25 MG 24 hr tablet Take 25 mg by mouth daily. Patient not taking: Reported on 09/16/2024    [provider]  metoprolol  tartrate (LOPRESSOR ) 25 MG tablet Take 25 mg by mouth 2 (two) times daily. 08/15/24   [provider]  Multiple Vitamin (MULTIVITAMIN WITH MINERALS) TABS tablet Take 1 tablet by mouth in the morning.    [provider]  nitroGLYCERIN  (NITROSTAT ) 0.4 MG SL tablet Place 1 tablet (0.4 mg total) under the tongue every 5 (five) minutes as needed for chest pain. 11/06/22   Ladona Heinz, MD  Omega-3 Fatty Acids (FISH OIL) 1000 MG CAPS Take 1,000 mg by mouth in the morning.    [provider]  omeprazole  (PRILOSEC) 20 MG capsule Take 1 capsule (20 mg total) by mouth daily. 03/26/21   Pearlean Manus, MD  potassium chloride  SA (KLOR-CON  M) 20 MEQ tablet Take 1 tablet (20 mEq total) by mouth daily. 08/04/24   Ladona Heinz, MD  tamsulosin  (FLOMAX ) 0.4 MG CAPS capsule Take 0.8 mg by mouth at bedtime.    [provider]  tiZANidine  (ZANAFLEX ) 4 MG tablet Take 4 mg by mouth at bedtime as needed for muscle spasms.    [provider]  torsemide (DEMADEX) 20 MG tablet Take 40 mg by mouth in the morning. 01/26/21   [provider]    Family History Family History  Problem Relation Age of Onset   Lung cancer Mother    Heart disease Mother    Emphysema Father    Heart disease Father    Cancer Sister 39       unknown type   Breast cancer Sister        dx. >50   Colon cancer Sister        dx. >50   Breast cancer Sister        dx. >50   Colon cancer Sister        dx. >50   Colon cancer Cousin        dx. early 15s   Esophageal cancer  Neg Hx    Rectal cancer  Neg Hx    Stomach cancer Neg Hx    Colon polyps Neg Hx     Social History Social History   Tobacco Use   Smoking status: Former    Current packs/day: 0.00    Average packs/day: 1 pack/day for 45.0 years (45.0 ttl pk-yrs)    Types: Cigarettes    Start date: 64    Quit date: 12/18/1995    Years since quitting: 28.8   Smokeless tobacco: Never  Vaping Use   Vaping status: Never Used  Substance Use Topics   Alcohol use: No    Comment: quit in 1980   Drug use: No     Allergies   Morphine  sulfate, Rosuvastatin  calcium , and Tramadol   Review of Systems Review of Systems Per HPI  Physical Exam Triage Vital Signs ED Triage Vitals  Encounter Vitals Group     BP 10/10/24 1416 120/69     Girls Systolic BP Percentile --      Girls Diastolic BP Percentile --      Boys Systolic BP Percentile --      Boys Diastolic BP Percentile --      Pulse Rate 10/10/24 1416 75     Resp 10/10/24 1416 18     Temp 10/10/24 1416 98.8 F (37.1 C)     Temp Source 10/10/24 1416 Oral     SpO2 10/10/24 1416 91 %     Weight --      Height --      Head Circumference --      Peak Flow --      Pain Score 10/10/24 1417 0     Pain Loc --      Pain Education --      Exclude from Growth Chart --    No data found.  Updated Vital Signs BP 120/69 (BP Location: Right Arm)   Pulse 75   Temp 98.8 F (37.1 C) (Oral)   Resp 18   SpO2 91%   Visual Acuity Right Eye Distance:   Left Eye Distance:   Bilateral Distance:    Right Eye Near:   Left Eye Near:    Bilateral Near:     Physical Exam Vitals and nursing note reviewed.  Constitutional:      General: He is not in acute distress.    Appearance: Normal appearance.  HENT:     Head: Normocephalic.     Right Ear: Tympanic membrane, ear canal and external ear normal.     Left Ear: Tympanic membrane, ear canal and external ear normal.     Nose: Congestion present.     Right Turbinates: Enlarged and swollen.     Left Turbinates: Enlarged and  swollen.     Right Sinus: No maxillary sinus tenderness or frontal sinus tenderness.     Left Sinus: No maxillary sinus tenderness or frontal sinus tenderness.     Mouth/Throat:     Lips: Pink.     Mouth: Mucous membranes are moist.     Pharynx: Uvula midline. Postnasal drip present. No pharyngeal swelling, oropharyngeal exudate, posterior oropharyngeal erythema or uvula swelling.     Comments: Cobblestoning present to posterior oropharynx  Eyes:     Extraocular Movements: Extraocular movements intact.     Conjunctiva/sclera: Conjunctivae normal.     Pupils: Pupils are equal, round, and reactive to light.  Cardiovascular:     Rate and Rhythm: Normal rate and regular rhythm.  Pulses: Normal pulses.     Heart sounds: Normal heart sounds.  Pulmonary:     Effort: Pulmonary effort is normal. No respiratory distress.     Breath sounds: Normal breath sounds. No stridor. No wheezing, rhonchi or rales.  Abdominal:     General: Bowel sounds are normal.     Palpations: Abdomen is soft.  Musculoskeletal:     Cervical back: Normal range of motion.  Lymphadenopathy:     Cervical: No cervical adenopathy.  Skin:    General: Skin is warm and dry.  Neurological:     General: No focal deficit present.     Mental Status: He is alert and oriented to person, place, and time.  Psychiatric:        Mood and Affect: Mood normal.        Behavior: Behavior normal.      UC Treatments / Results  Labs (all labs ordered are listed, but only abnormal results are displayed) Labs Reviewed  POC COVID19/FLU A&B COMBO    EKG   Radiology No results found.  Procedures Procedures (including critical care time)  Medications Ordered in UC Medications - No data to display  Initial Impression / Assessment and Plan / UC Course  I have reviewed the triage vital signs and the nursing notes.  Pertinent labs & imaging results that were available during my care of the patient were reviewed by me and  considered in my medical decision making (see chart for details).  The COVID/flu test was negative.  On exam, the patient's lung sounds are clear throughout.  The patient does not exhibit any other symptoms at this time.  He currently is afebrile.  Discussion with patient and advised that he may develop new symptoms over the next 1 to 2 days.  Patient was given indications regarding follow-up.  Supportive care recommendations were provided discussed with the patient to include use of over-the-counter Tylenol , fluids, and rest.  Patient was in agreement with this plan of care and verbalizes understanding.  All questions were answered.  Patient stable for discharge.   Final Clinical Impressions(s) / UC Diagnoses   Final diagnoses:  None   Discharge Instructions   None    ED Prescriptions   None    PDMP not reviewed this encounter.   Gilmer Etta PARAS, NP 10/10/24 1445

## 2024-10-10 NOTE — Discharge Instructions (Signed)
 The COVID/flu test was negative. Recommend over-the-counter Tylenol  as needed for pain, fever, or general discomfort. You can begin using the nasal spray that you have at home to help with nasal congestion or runny nose. Continue to monitor your symptoms for worsening.  You may develop new symptoms over the next 1 to 2 days.  Seek care if you develop worsening fever, shortness of breath, difficulty breathing, or other concerns. Follow-up as needed.

## 2024-10-15 DIAGNOSIS — I10 Essential (primary) hypertension: Secondary | ICD-10-CM | POA: Diagnosis not present

## 2024-10-15 DIAGNOSIS — J410 Simple chronic bronchitis: Secondary | ICD-10-CM | POA: Diagnosis not present

## 2024-10-15 DIAGNOSIS — I5032 Chronic diastolic (congestive) heart failure: Secondary | ICD-10-CM | POA: Diagnosis not present

## 2024-10-15 DIAGNOSIS — I4819 Other persistent atrial fibrillation: Secondary | ICD-10-CM | POA: Diagnosis not present

## 2024-10-16 DIAGNOSIS — E7800 Pure hypercholesterolemia, unspecified: Secondary | ICD-10-CM | POA: Diagnosis not present

## 2024-10-16 DIAGNOSIS — I4819 Other persistent atrial fibrillation: Secondary | ICD-10-CM | POA: Diagnosis not present

## 2024-10-16 DIAGNOSIS — N1831 Chronic kidney disease, stage 3a: Secondary | ICD-10-CM | POA: Diagnosis not present

## 2024-10-16 DIAGNOSIS — J410 Simple chronic bronchitis: Secondary | ICD-10-CM | POA: Diagnosis not present

## 2024-10-16 DIAGNOSIS — I10 Essential (primary) hypertension: Secondary | ICD-10-CM | POA: Diagnosis not present

## 2024-10-16 DIAGNOSIS — I5032 Chronic diastolic (congestive) heart failure: Secondary | ICD-10-CM | POA: Diagnosis not present

## 2024-10-19 ENCOUNTER — Other Ambulatory Visit: Payer: Self-pay | Admitting: Cardiology

## 2024-10-19 NOTE — Telephone Encounter (Signed)
 Prescription refill request for Eliquis  received. Indication:afib Last office visit:9/25 Scr:1.21  2025 Age: 83 Weight:109.4  kg  Prescription refilled

## 2024-10-22 DIAGNOSIS — H35373 Puckering of macula, bilateral: Secondary | ICD-10-CM | POA: Diagnosis not present

## 2024-10-22 DIAGNOSIS — Z961 Presence of intraocular lens: Secondary | ICD-10-CM | POA: Diagnosis not present

## 2024-10-22 DIAGNOSIS — H43813 Vitreous degeneration, bilateral: Secondary | ICD-10-CM | POA: Diagnosis not present

## 2024-10-26 ENCOUNTER — Other Ambulatory Visit: Payer: Self-pay | Admitting: Surgery

## 2024-10-26 DIAGNOSIS — I7121 Aneurysm of the ascending aorta, without rupture: Secondary | ICD-10-CM

## 2024-11-14 DIAGNOSIS — I10 Essential (primary) hypertension: Secondary | ICD-10-CM | POA: Diagnosis not present

## 2024-11-14 DIAGNOSIS — I4819 Other persistent atrial fibrillation: Secondary | ICD-10-CM | POA: Diagnosis not present

## 2024-11-14 DIAGNOSIS — I5032 Chronic diastolic (congestive) heart failure: Secondary | ICD-10-CM | POA: Diagnosis not present

## 2024-11-14 DIAGNOSIS — J410 Simple chronic bronchitis: Secondary | ICD-10-CM | POA: Diagnosis not present

## 2024-11-15 DIAGNOSIS — I10 Essential (primary) hypertension: Secondary | ICD-10-CM | POA: Diagnosis not present

## 2024-11-15 DIAGNOSIS — E7800 Pure hypercholesterolemia, unspecified: Secondary | ICD-10-CM | POA: Diagnosis not present

## 2024-11-15 DIAGNOSIS — N1831 Chronic kidney disease, stage 3a: Secondary | ICD-10-CM | POA: Diagnosis not present

## 2024-11-15 DIAGNOSIS — J410 Simple chronic bronchitis: Secondary | ICD-10-CM | POA: Diagnosis not present

## 2024-11-15 DIAGNOSIS — I5032 Chronic diastolic (congestive) heart failure: Secondary | ICD-10-CM | POA: Diagnosis not present

## 2024-11-15 DIAGNOSIS — I4819 Other persistent atrial fibrillation: Secondary | ICD-10-CM | POA: Diagnosis not present

## 2024-11-17 ENCOUNTER — Ambulatory Visit (HOSPITAL_COMMUNITY)
Admission: RE | Admit: 2024-11-17 | Discharge: 2024-11-17 | Disposition: A | Source: Ambulatory Visit | Attending: Pulmonary Disease | Admitting: Pulmonary Disease

## 2024-11-17 DIAGNOSIS — J42 Unspecified chronic bronchitis: Secondary | ICD-10-CM | POA: Insufficient documentation

## 2024-11-17 DIAGNOSIS — R942 Abnormal results of pulmonary function studies: Secondary | ICD-10-CM | POA: Diagnosis not present

## 2024-11-17 DIAGNOSIS — Z87891 Personal history of nicotine dependence: Secondary | ICD-10-CM | POA: Diagnosis not present

## 2024-11-17 DIAGNOSIS — R062 Wheezing: Secondary | ICD-10-CM | POA: Insufficient documentation

## 2024-11-17 LAB — PULMONARY FUNCTION TEST
DL/VA % pred: 104 %
DL/VA: 3.92 ml/min/mmHg/L
DLCO unc % pred: 79 %
DLCO unc: 19.31 ml/min/mmHg
FEF 25-75 Pre: 2.13 L/s
FEF2575-%Pred-Pre: 113 %
FEV1-%Pred-Pre: 86 %
FEV1-Pre: 2.45 L
FEV1FVC-%Pred-Pre: 109 %
FEV6-%Pred-Pre: 82 %
FEV6-Pre: 3.11 L
FEV6FVC-%Pred-Pre: 105 %
FVC-%Pred-Pre: 78 %
FVC-Pre: 3.16 L
Pre FEV1/FVC ratio: 77 %
Pre FEV6/FVC Ratio: 98 %
RV % pred: 106 %
RV: 2.98 L
TLC % pred: 86 %
TLC: 6.32 L

## 2024-11-17 LAB — BLOOD GAS, ARTERIAL
Acid-Base Excess: 3.7 mmol/L — ABNORMAL HIGH (ref 0.0–2.0)
Bicarbonate: 27.7 mmol/L (ref 20.0–28.0)
Drawn by: 560031
O2 Saturation: 96.5 %
Patient temperature: 37
pCO2 arterial: 39 mmHg (ref 32–48)
pH, Arterial: 7.46 — ABNORMAL HIGH (ref 7.35–7.45)
pO2, Arterial: 71 mmHg — ABNORMAL LOW (ref 83–108)

## 2024-11-17 NOTE — Progress Notes (Signed)
 RT NOTE:  ABG results obtained at 0915, results as followed:    Latest Reference Range & Units 11/17/24 09:15  pH, Arterial 7.35 - 7.45  7.46 (H)  pCO2 arterial 32 - 48 mmHg 39  pO2, Arterial 83 - 108 mmHg 71 (L)  Acid-Base Excess 0.0 - 2.0 mmol/L 3.7 (H)  Bicarbonate 20.0 - 28.0 mmol/L 27.7  O2 Saturation % 96.5  Patient temperature  37.0  Collection site  LEFT RADIAL  Allens test (pass/fail) PASS  PASS  (H): Data is abnormally high (L): Data is abnormally low

## 2024-11-18 ENCOUNTER — Encounter (HOSPITAL_BASED_OUTPATIENT_CLINIC_OR_DEPARTMENT_OTHER): Admitting: Internal Medicine

## 2024-11-20 ENCOUNTER — Ambulatory Visit (HOSPITAL_COMMUNITY)
Admission: RE | Admit: 2024-11-20 | Discharge: 2024-11-20 | Disposition: A | Source: Ambulatory Visit | Attending: Surgery | Admitting: Surgery

## 2024-11-20 DIAGNOSIS — I7121 Aneurysm of the ascending aorta, without rupture: Secondary | ICD-10-CM

## 2024-11-20 MED ORDER — IOHEXOL 350 MG/ML SOLN
75.0000 mL | Freq: Once | INTRAVENOUS | Status: AC | PRN
  Administered 2024-11-20: 75 mL via INTRAVENOUS

## 2024-11-24 ENCOUNTER — Encounter (HOSPITAL_COMMUNITY)

## 2024-11-24 ENCOUNTER — Ambulatory Visit: Admitting: Student in an Organized Health Care Education/Training Program

## 2024-11-29 ENCOUNTER — Ambulatory Visit: Payer: Self-pay | Admitting: Pulmonary Disease

## 2024-11-29 DIAGNOSIS — R001 Bradycardia, unspecified: Secondary | ICD-10-CM

## 2024-12-02 ENCOUNTER — Ambulatory Visit

## 2024-12-02 DIAGNOSIS — I7121 Aneurysm of the ascending aorta, without rupture: Secondary | ICD-10-CM | POA: Diagnosis not present

## 2024-12-02 NOTE — Progress Notes (Signed)
 [address] [place] [address]             [phone]       CARDIOTHORACIC SURGERY TELEPHONE VIRTUAL OFFICE NOTE  Referring Provider is Arloa Elsie SAUNDERS, MD Primary Cardiologist is Gordy Bergamo, MD PCP is Arloa Elsie SAUNDERS, MD   HPI:  I spoke with [name] (DOB Jun 07, 1941 ) via telephone on 12/02/2024 at 9:47 AM and verified that I was speaking with the correct person using more than one form of identification.  We discussed the fact that I was contacting them from my office in [place] and they were located at home in [place], as well as the reason(s) for conducting our visit virtually instead of in-person.  The patient expressed understanding the circumstances and agreed to proceed as described.  [name] is an 83 year old man with medical history of hypertension, coronary artery disease s/p CABG, bundle branch block, atrial fibrillation, chronic diastolic heart failure, OSA, COPD, GERD, chronic kidney disease stage 3a, and hyperlipidemia who presents for continued follow up of ascending thoracic aortic aneurysm.  He has been followed by our clinic since 2016 and his aneurysm has stayed stable in size.  On recent CTA of chest aneurysm measured 4.2 cm.   He reports that he is doing well.  His blood pressure is controlled with current medications.  He checks his blood pressure at home regularly and readings are usually 130s/80s.  He sometimes has elevated readings over 140s systolic.  He is active and denies heavy lifting.     Current Outpatient Medications  Medication Sig Dispense Refill   albuterol  (PROVENTIL ) (2.5 MG/3ML) 0.083% nebulizer solution USE 1 VIAL IN NEBULIZER EVERY 6 HOURS AS NEEDED FOR WHEEZING OR SHORTNESS OF BREATH 90 mL 0   albuterol  (VENTOLIN  HFA) 108 (90 Base) MCG/ACT inhaler Inhale 2 puffs into the lungs every 6 (six) hours as needed for wheezing or shortness of breath. 8 g 6   apixaban  (ELIQUIS ) 5 MG TABS tablet  Take 1 tablet by mouth twice daily 180 tablet 1   atorvastatin  (LIPITOR) 10 MG tablet Take 1 tablet (10 mg total) by mouth at bedtime. 90 tablet 3   budesonide-glycopyrrolate-formoterol (BREZTRI  AEROSPHERE) 160-9-4.8 MCG/ACT AERO inhaler Inhale 2 puffs into the lungs in the morning and at bedtime. 1 each 6   clonazePAM  (KLONOPIN ) 1 MG tablet Take 1 mg by mouth at bedtime as needed (sleep).     ezetimibe  (ZETIA ) 10 MG tablet Take 1 tablet (10 mg total) by mouth daily. 90 tablet 3   fluticasone  (FLONASE ) 50 MCG/ACT nasal spray Place 2 sprays into both nostrils daily. 16 g 2   gabapentin  (NEURONTIN ) 300 MG capsule Take 300 mg by mouth 2 (two) times daily.     guaiFENesin (MUCINEX) 600 MG 12 hr tablet Take 600 mg by mouth daily as needed (bronchitis).     HYDROcodone -acetaminophen  (NORCO/VICODIN) 5-325 MG tablet Take 1 tablet by mouth every 6 (six) hours as needed for moderate pain.     Magnesium 200 MG TABS Take 200 mg by mouth in the morning.     metoprolol  succinate (TOPROL -XL) 25 MG 24 hr tablet Take 25 mg by mouth daily. (Patient not taking: Reported on 09/16/2024)     metoprolol  tartrate (LOPRESSOR ) 25 MG tablet Take 25 mg by mouth 2 (two) times daily.     Multiple Vitamin (MULTIVITAMIN WITH MINERALS) TABS tablet Take 1 tablet by mouth  in the morning.     nitroGLYCERIN  (NITROSTAT ) 0.4 MG SL tablet Place 1 tablet (0.4 mg total) under the tongue every 5 (five) minutes as needed for chest pain. 25 tablet 3   Omega-3 Fatty Acids (FISH OIL) 1000 MG CAPS Take 1,000 mg by mouth in the morning.     omeprazole  (PRILOSEC) 20 MG capsule Take 1 capsule (20 mg total) by mouth daily. 30 capsule 3   potassium chloride  SA (KLOR-CON  M) 20 MEQ tablet Take 1 tablet (20 mEq total) by mouth daily. 90 tablet 3   tamsulosin  (FLOMAX ) 0.4 MG CAPS capsule Take 0.8 mg by mouth at bedtime.     tiZANidine  (ZANAFLEX ) 4 MG tablet Take 4 mg by mouth at bedtime as needed for muscle spasms.     torsemide (DEMADEX) 20 MG tablet  Take 40 mg by mouth in the morning.     No current facility-administered medications for this visit.     Diagnostic Tests:  EXAM: CTA CHEST AORTA 11/20/2024 09:58:36 AM   TECHNIQUE: CTA of the chest was performed after the administration of intravenous contrast. Multiplanar reformatted images are provided for review. MIP images are provided for review. Automated exposure control, iterative reconstruction, and/or weight based adjustment of the mA/kV was utilized to reduce the radiation dose to as low as reasonably achievable.   CONTRAST: 75 mL of Omnipaque  350.   COMPARISON: CTA Chest 09/12/2023.   CLINICAL HISTORY: Aortic aneurysm suspected. History follow-up thoracic aortic aneurysm.   FINDINGS:   AORTA: Ascending thoracic aorta measures up to 4.2 cm, stable since prior study. Aortic atherosclerosis. No thoracic aortic dissection.   MEDIASTINUM: Prior CABG. Heart borderline enlarged. The pericardium demonstrates no acute abnormality.   LYMPH NODES: No mediastinal, hilar or axillary lymphadenopathy.   LUNGS AND PLEURA: Scattered ground glass opacities in the lungs bilaterally could reflect early edema. Biapical scarring. No focal consolidation. No pleural effusion or pneumothorax.   UPPER ABDOMEN: Limited images of the upper abdomen are unremarkable.   SOFT TISSUES AND BONES: No acute bone or soft tissue abnormality.   IMPRESSION: 1. Stable ascending thoracic aortic aneurysm measuring up to 4.2 cm. Recommend continued annual follow up . 2. Scattered bilateral ground glass opacities, which could reflect early edema. 3. Aortic atherosclerosis.   Electronically signed by: Franky Crease MD 11/26/2024 08:16 PM EST RP Workstation: [id number]   Plan:  Aneurysm of ascending aorta without rupture -4.2 cm ascending thoracic aortic aneurysm on CTA of chest.  -We discussed the natural history and and risk factors for growth of ascending aortic aneurysms. Discussed  recommendations to minimize the risk of further expansion or dissection including careful blood pressure control, avoidance of contact sports and heavy lifting, attention to lipid management.  We covered the importance of smoking cessation.  The patient does not yet meet surgical criteria of >5.5cm. The patient is aware of signs and symptoms of aortic dissection and when to present to the emergency department   -Follow up in one year with CTA of chest for continued surveillance     I discussed limitations of evaluation and management via telephone.  The patient was advised to call back for repeat telephone consultation or to seek an in-person evaluation if questions arise or the patient's clinical condition changes in any significant manner.  I spent in excess of 20 minutes of non-face-to-face time during the conduct of this telephone virtual office consultation, including pre-visit review of the patient's records and direct conversation with the patient.   Level 1  (  99441)             5-10 minutes Level 2  (99442)            11-20 minutes Level 3  (99443)            21-30 minutes   Manuelita CHRISTELLA Rough, PA-C 12/02/2024 9:47 AM

## 2024-12-02 NOTE — Patient Instructions (Signed)

## 2024-12-07 ENCOUNTER — Ambulatory Visit: Admitting: Pulmonary Disease

## 2024-12-18 ENCOUNTER — Ambulatory Visit
Attending: Student in an Organized Health Care Education/Training Program | Admitting: Student in an Organized Health Care Education/Training Program

## 2025-01-20 ENCOUNTER — Encounter: Payer: Self-pay | Admitting: Cardiology

## 2025-01-20 ENCOUNTER — Ambulatory Visit: Admitting: Cardiology

## 2025-01-20 VITALS — BP 110/60 | HR 80 | Resp 16 | Ht 71.0 in | Wt 240.7 lb

## 2025-01-20 DIAGNOSIS — G4733 Obstructive sleep apnea (adult) (pediatric): Secondary | ICD-10-CM

## 2025-01-20 DIAGNOSIS — I495 Sick sinus syndrome: Secondary | ICD-10-CM

## 2025-01-20 DIAGNOSIS — I480 Paroxysmal atrial fibrillation: Secondary | ICD-10-CM

## 2025-01-20 DIAGNOSIS — I4729 Other ventricular tachycardia: Secondary | ICD-10-CM

## 2025-01-20 DIAGNOSIS — I2510 Atherosclerotic heart disease of native coronary artery without angina pectoris: Secondary | ICD-10-CM

## 2025-01-20 MED ORDER — METOPROLOL SUCCINATE ER 25 MG PO TB24: 25.0000 mg | ORAL_TABLET | Freq: Every day | ORAL | 3 refills | Status: AC

## 2025-01-20 NOTE — Patient Instructions (Signed)
 Medication Instructions:  Meds ordered this encounter  Medications   metoprolol  succinate (TOPROL -XL) 25 MG 24 hr tablet    Sig: Take 1 tablet (25 mg total) by mouth daily.    Dispense:  90 tablet    Refill:  3   Medications Discontinued During This Encounter  Medication Reason   metoprolol  succinate (TOPROL -XL) 25 MG 24 hr tablet    metoprolol  tartrate (LOPRESSOR ) 25 MG tablet Discontinued by provider     *If you need a refill on your cardiac medications before your next appointment, please call your pharmacy*  Lab Work: Lab Orders  No laboratory test(s) ordered today    If you have labs (blood work) drawn today and your tests are completely normal, you will receive your results only by: MyChart Message (if you have MyChart) OR A paper copy in the mail If you have any lab test that is abnormal or we need to change your treatment, we will call you to review the results.  Testing/Procedures:  ECHOCARDIOGRAM  Your physician has requested that you have an echocardiogram. Echocardiography is a painless test that uses sound waves to create images of your heart. It provides your doctor with information about the size and shape of your heart and how well your hearts chambers and valves are working. This procedure takes approximately one hour. There are no restrictions for this procedure. Please do NOT wear cologne, perfume, aftershave, or lotions (deodorant is allowed). Please arrive 15 minutes prior to your appointment time.  Please note: We ask at that you not bring children with you during ultrasound (echo/ vascular) testing. Due to room size and safety concerns, children are not allowed in the ultrasound rooms during exams. Our front office staff cannot provide observation of children in our lobby area while testing is being conducted. An adult accompanying a patient to their appointment will only be allowed in the ultrasound room at the discretion of the ultrasound technician under  special circumstances. We apologize for any inconvenience.   NUCLEAR STRESS TEST  Your physician has requested that you have a Myocardial Perfusion Imaging Study.  Please arrive 15 minutes prior to your appointment time for registration.  The test will take approximately 3 to 4 hours to complete.  Instructions: You may take your medications the morning of the test (someone from the testing area will call you with these specific instructions). Do not eat or drink 3 hours prior to your test, except you may have unlimited water. Do not consume products containing caffeine (regular or decaffeinated) for 12 hours prior to your test (coffee, tea, soda, chocolate, energy products and some medications). Please wear a 2-piece outfit (no dresses, overalls, etc.) Do not wear cologne, perfume, aftershave or lotions (deodorant is allowed) If you use an inhaler, bring it with you to the test.  Please note: If anyone comes with you to the appointment, they will need to remain in the main lobby due to limited space in the testing area. We also ask that you not bring children with you during testing. Due to room size and safety concerns, children are not allowed in the testing rooms during exams. Our front office staff cannot provide observation of children in our lobby area while testing is being conducted.  **If you are pregnant or breastfeeding, please notify the technologists prior to your appointment.**   Follow-Up: At [facility], you and your health needs are our priority.  As part of our continuing mission to provide you with exceptional heart  care, our providers are all part of one team.  This team includes your primary Cardiologist (physician) and Advanced Practice Providers or APPs (Physician Assistants and Nurse Practitioners) who all work together to provide you with the care you need, when you need it.  Your next appointment:   6 month(s)  Provider:   Gordy Bergamo, MD    We  recommend signing up for the patient portal called MyChart.  Patients are able to view lab/test results, encounter notes, upcoming appointments, etc.  Non-urgent messages can be sent to your provider as well, go to [email or web address].

## 2025-01-20 NOTE — Progress Notes (Unsigned)
 " Cardiology Office Note:  .   Date:  01/20/2025  ID:  [name], DOB Oct 28, 1941, MRN [id number] PCP: Arloa Elsie SAUNDERS, MD  [facility] Providers Cardiologist:  Gordy Bergamo, MD Electrophysiologist:  Donnice DELENA Primus, MD { Click to update primary MD,subspecialty MD or APP then REFRESH:1}  History of Present Illness: .   [name] is a 84 y.o.  CAD S/P CABG in 2007 in the setting of non-STEMI and atrial fibrillation with RVR. 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  On his last office visit admitted recommendation to follow-up with EP in view of NSVT episodes, PAF, episodes of bradycardia and also referred him back to pulmonary medicine for repeating sleep study as he was not compliant with CPAP as equipment was old.  Sleep study has not been completed, PFTs revealed reduced diffusion capacity and may indicate pulmonary vascular process.  This is a 65-month office visit. He was evaluated by Dr. Donnice Primus from EP standpoint and he is being followed closely.  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  He has also had ASD repair on 06/13/2021 noted on TEE prior to cardioversion.     Discussed the use of AI scribe software for clinical note transcription with the patient, who gave verbal consent to proceed.  History of Present Illness [name] is an 84 year old male with paroxysmal atrial fibrillation and coronary artery disease who presents for cardiovascular follow-up. He is accompanied by his wife, Orlean. He was referred by Dr. Donnice Primus for evaluation of his cardiac condition.  He has  paroxysmal atrial fibrillation and takes apixaban  5 mg twice daily for stroke prevention. He notes the medication is expensive but is able to afford it through a fixed monthly payment plan.  He has intermittent tachycardia and bradycardia, with heart rates sometimes in the forties associated with chest tightness. He is on metoprolol  succinate 25 mg daily. He notes his heart rate is often in the eighties and relates this to prior short-acting therapy.  He has coronary artery disease and takes atorvastatin  10 mg daily. He reports episodes of non-sustained ventricular tachycardia. He has not had a recent stress test or echocardiogram.  He has sleep apnea and is not using CPAP because the device is outdated and needs reevaluation. He is awaiting a repeat sleep study.  He reports recent weight gain and is working with his wife to increase physical activity, including walking.  Cardiac Studies relevent.    Coronary angiogram 10/08/2018  3.0 x 23 mm Sierra Xience DES to RI. Patent LIMA to LAD, SVG to D2 and SVG to RCA widely patent.  Normal right heart catheterization, preserved cardiac output and cardiac index.     Echocardiogram 03/20/2022: Left ventricle cavity is normal in size. Moderate concentric hypertrophy of the left ventricle. Normal global wall motion. Normal LV systolic function with visual EF 50-55%. Doppler evidence of grade I (impaired) diastolic dysfunction, normal LAP. The aortic root is upper normal at 3.8 cm. No significant valvular abnormality. Previous study in 2018 noted mild LA dilatation, aortic root 4.1 cm.  Zio monitor Result date: 09/17/24-10/01/24 HR 46 - 214, average 77  bpm. No atrial fibrillation detected. Occasional supraventricular ectopy, 4.6%. Rare ventricular ectopy. NSVT 9 beats. SVT brief with longest episode 15.9 seconds (possible AT).  Occasional bigeminy and trigeminy.  EKG:      Labs  Care everywhere/Faxed External Labs:  ***  ROS   ***ROS Physical Exam:   VS:  There were no vitals taken for this visit.   Wt Readings from Last 3 Encounters:  09/16/24 241 lb 3.2 oz (109.4 kg)  09/07/24 234 lb 1.6 oz (106.2 kg)  08/05/24 237 lb 12.8 oz (107.9 kg)    BP Readings from Last 3 Encounters:  10/10/24 106/67  09/16/24 (!) 145/78  09/07/24 92/64   ***Physical Exam  ASSESSMENT AND PLAN: .      ICD-10-CM   1. Paroxysmal atrial fibrillation (HCC)  I48.0     2. 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 Coronary artery disease involving native coronary artery of native heart without angina pectoris  I25.10     4. Essential hypertension  I10      Assessment & Plan Coronary artery disease Managed with atorvastatin  10 mg daily. No recent stress test or echocardiogram performed. Potential for coronary blockages contributing to irregular heart rhythms. - Ordered nuclear stress test to assess for new coronary blockages. - Ordered echocardiogram to evaluate cardiac function.  Paroxysmal atrial fibrillation Managed with apixaban  5 mg twice daily to prevent stroke. Regular rhythm maintained. Discussed cost management of apixaban  with pharmacy. - Continue apixaban  5 mg twice daily. - Monitor for signs of bleeding, such as black or tarry stools, and report if he occurs.  Nonsustained ventricular tachycardia Irregular heart rhythm potentially related to coronary blockages. No recent stress test or echocardiogram performed. - Ordered nuclear stress test to assess for coronary blockages. - Ordered echocardiogram to evaluate cardiac function.  Tachycardia-bradycardia syndrome Episodes of fast and slow heart rates. No current indication for pacemaker. Metoprolol  succinate 25 mg daily prescribed for heart rate control. Previous prescription error corrected to metoprolol  succinate. - Continue metoprolol  succinate 25 mg daily. - Monitor heart rate and symptoms.  Obstructive sleep apnea Non-compliance to CPAP therapy.  Discussed importance of CPAP for preventing strokes, heart attacks, and improving energy levels. Sleep study ordered but not yet completed. - Follow up with sleep study to reassess CPAP needs. - Encouraged compliance with CPAP therapy.  Follow up: 6 months. F/U stress, Echo, PAF  Signed,  Gordy Bergamo, MD, [facility] 01/20/2025, 6:45 AM [facility] [address] [place], [place] [address] Phone: [phone]. Fax:  [phone]  "

## 2025-01-21 ENCOUNTER — Other Ambulatory Visit: Payer: Self-pay | Admitting: Cardiology

## 2025-01-21 DIAGNOSIS — I2510 Atherosclerotic heart disease of native coronary artery without angina pectoris: Secondary | ICD-10-CM

## 2025-01-21 DIAGNOSIS — I4729 Other ventricular tachycardia: Secondary | ICD-10-CM

## 2025-01-25 ENCOUNTER — Ambulatory Visit (HOSPITAL_BASED_OUTPATIENT_CLINIC_OR_DEPARTMENT_OTHER): Admitting: Internal Medicine

## 2025-01-26 ENCOUNTER — Encounter (HOSPITAL_COMMUNITY)

## 2025-02-22 ENCOUNTER — Ambulatory Visit: Admitting: Pulmonary Disease

## 2025-03-01 ENCOUNTER — Ambulatory Visit (HOSPITAL_COMMUNITY)

## 2025-03-09 ENCOUNTER — Ambulatory Visit: Admitting: Cardiology
